# Patient Record
Sex: Male | Born: 1955
Health system: Southern US, Community
[De-identification: ages and names within clinical notes are randomized; demographics above are authoritative.]

## PROBLEM LIST (undated history)

## (undated) DIAGNOSIS — I351 Nonrheumatic aortic (valve) insufficiency: Secondary | ICD-10-CM

## (undated) DIAGNOSIS — N189 Chronic kidney disease, unspecified: Secondary | ICD-10-CM

## (undated) DIAGNOSIS — R55 Syncope and collapse: Secondary | ICD-10-CM

## (undated) DIAGNOSIS — F419 Anxiety disorder, unspecified: Secondary | ICD-10-CM

## (undated) DIAGNOSIS — G4733 Obstructive sleep apnea (adult) (pediatric): Secondary | ICD-10-CM

## (undated) DIAGNOSIS — Z87442 Personal history of urinary calculi: Secondary | ICD-10-CM

## (undated) DIAGNOSIS — Z953 Presence of xenogenic heart valve: Secondary | ICD-10-CM

## (undated) DIAGNOSIS — R0600 Dyspnea, unspecified: Secondary | ICD-10-CM

## (undated) DIAGNOSIS — M792 Neuralgia and neuritis, unspecified: Secondary | ICD-10-CM

## (undated) DIAGNOSIS — D649 Anemia, unspecified: Secondary | ICD-10-CM

## (undated) DIAGNOSIS — J45909 Unspecified asthma, uncomplicated: Secondary | ICD-10-CM

## (undated) DIAGNOSIS — M1990 Unspecified osteoarthritis, unspecified site: Secondary | ICD-10-CM

## (undated) DIAGNOSIS — R0602 Shortness of breath: Secondary | ICD-10-CM

## (undated) DIAGNOSIS — D631 Anemia in chronic kidney disease: Secondary | ICD-10-CM

## (undated) DIAGNOSIS — R7303 Prediabetes: Secondary | ICD-10-CM

## (undated) DIAGNOSIS — I509 Heart failure, unspecified: Secondary | ICD-10-CM

## (undated) DIAGNOSIS — I5033 Acute on chronic diastolic (congestive) heart failure: Secondary | ICD-10-CM

## (undated) DIAGNOSIS — I10 Essential (primary) hypertension: Secondary | ICD-10-CM

## (undated) DIAGNOSIS — Q238 Other congenital malformations of aortic and mitral valves: Secondary | ICD-10-CM

## (undated) HISTORY — DX: Nonrheumatic aortic (valve) insufficiency: I35.1

## (undated) HISTORY — DX: Neuralgia and neuritis, unspecified: M79.2

## (undated) HISTORY — PX: BACK SURGERY: SHX140

## (undated) HISTORY — PX: HERNIA REPAIR: SHX51

## (undated) HISTORY — DX: Anxiety disorder, unspecified: F41.9

## (undated) HISTORY — DX: Shortness of breath: R06.02

---
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
## 1997-10-27 ENCOUNTER — Encounter (HOSPITAL_COMMUNITY): Admission: RE | Admit: 1997-10-27 | Discharge: 1998-01-25 | Payer: Self-pay | Admitting: Internal Medicine

## 1998-01-25 ENCOUNTER — Emergency Department (HOSPITAL_COMMUNITY): Admission: EM | Admit: 1998-01-25 | Discharge: 1998-01-25 | Payer: Self-pay | Admitting: Emergency Medicine

## 1999-02-19 ENCOUNTER — Encounter: Payer: Self-pay | Admitting: Emergency Medicine

## 1999-02-19 ENCOUNTER — Emergency Department (HOSPITAL_COMMUNITY): Admission: EM | Admit: 1999-02-19 | Discharge: 1999-02-19 | Payer: Self-pay | Admitting: Emergency Medicine

## 1999-10-18 ENCOUNTER — Emergency Department (HOSPITAL_COMMUNITY): Admission: EM | Admit: 1999-10-18 | Discharge: 1999-10-18 | Payer: Self-pay | Admitting: Emergency Medicine

## 1999-12-18 ENCOUNTER — Emergency Department (HOSPITAL_COMMUNITY): Admission: EM | Admit: 1999-12-18 | Discharge: 1999-12-18 | Payer: Self-pay | Admitting: Emergency Medicine

## 2000-01-02 ENCOUNTER — Emergency Department (HOSPITAL_COMMUNITY): Admission: EM | Admit: 2000-01-02 | Discharge: 2000-01-02 | Payer: Self-pay | Admitting: Emergency Medicine

## 2000-09-05 ENCOUNTER — Encounter: Payer: Self-pay | Admitting: Emergency Medicine

## 2000-09-05 ENCOUNTER — Emergency Department (HOSPITAL_COMMUNITY): Admission: EM | Admit: 2000-09-05 | Discharge: 2000-09-05 | Payer: Self-pay | Admitting: Emergency Medicine

## 2000-09-10 ENCOUNTER — Emergency Department (HOSPITAL_COMMUNITY): Admission: EM | Admit: 2000-09-10 | Discharge: 2000-09-10 | Payer: Self-pay | Admitting: *Deleted

## 2001-02-10 ENCOUNTER — Emergency Department (HOSPITAL_COMMUNITY): Admission: EM | Admit: 2001-02-10 | Discharge: 2001-02-10 | Payer: Self-pay | Admitting: Emergency Medicine

## 2001-03-19 ENCOUNTER — Emergency Department (HOSPITAL_COMMUNITY): Admission: EM | Admit: 2001-03-19 | Discharge: 2001-03-19 | Payer: Self-pay | Admitting: Emergency Medicine

## 2001-04-19 ENCOUNTER — Emergency Department (HOSPITAL_COMMUNITY): Admission: EM | Admit: 2001-04-19 | Discharge: 2001-04-19 | Payer: Self-pay | Admitting: Emergency Medicine

## 2001-04-19 ENCOUNTER — Encounter: Payer: Self-pay | Admitting: Emergency Medicine

## 2001-06-30 ENCOUNTER — Emergency Department (HOSPITAL_COMMUNITY): Admission: EM | Admit: 2001-06-30 | Discharge: 2001-06-30 | Payer: Self-pay | Admitting: *Deleted

## 2002-04-21 ENCOUNTER — Emergency Department (HOSPITAL_COMMUNITY): Admission: EM | Admit: 2002-04-21 | Discharge: 2002-04-21 | Payer: Self-pay | Admitting: Emergency Medicine

## 2002-04-21 ENCOUNTER — Encounter: Payer: Self-pay | Admitting: Emergency Medicine

## 2002-12-12 ENCOUNTER — Encounter: Payer: Self-pay | Admitting: Emergency Medicine

## 2002-12-13 ENCOUNTER — Inpatient Hospital Stay (HOSPITAL_COMMUNITY): Admission: EM | Admit: 2002-12-13 | Discharge: 2002-12-16 | Payer: Self-pay | Admitting: Neurological Surgery

## 2002-12-13 ENCOUNTER — Encounter: Payer: Self-pay | Admitting: Neurological Surgery

## 2002-12-14 ENCOUNTER — Encounter (INDEPENDENT_AMBULATORY_CARE_PROVIDER_SITE_OTHER): Payer: Self-pay | Admitting: Cardiology

## 2002-12-16 ENCOUNTER — Encounter: Payer: Self-pay | Admitting: Neurology

## 2003-01-11 ENCOUNTER — Ambulatory Visit (HOSPITAL_COMMUNITY): Admission: RE | Admit: 2003-01-11 | Discharge: 2003-01-11 | Payer: Self-pay | Admitting: Family Medicine

## 2003-01-15 ENCOUNTER — Encounter: Admission: RE | Admit: 2003-01-15 | Discharge: 2003-01-15 | Payer: Self-pay | Admitting: Family Medicine

## 2003-01-15 ENCOUNTER — Encounter: Payer: Self-pay | Admitting: Family Medicine

## 2003-03-15 ENCOUNTER — Ambulatory Visit (HOSPITAL_COMMUNITY): Admission: RE | Admit: 2003-03-15 | Discharge: 2003-03-15 | Payer: Self-pay | Admitting: Internal Medicine

## 2003-03-16 ENCOUNTER — Ambulatory Visit (HOSPITAL_COMMUNITY): Admission: RE | Admit: 2003-03-16 | Discharge: 2003-03-16 | Payer: Self-pay | Admitting: Internal Medicine

## 2003-10-06 ENCOUNTER — Emergency Department (HOSPITAL_COMMUNITY): Admission: EM | Admit: 2003-10-06 | Discharge: 2003-10-06 | Payer: Self-pay | Admitting: Emergency Medicine

## 2004-02-16 ENCOUNTER — Emergency Department (HOSPITAL_COMMUNITY): Admission: EM | Admit: 2004-02-16 | Discharge: 2004-02-16 | Payer: Self-pay | Admitting: Emergency Medicine

## 2004-02-17 ENCOUNTER — Emergency Department (HOSPITAL_COMMUNITY): Admission: EM | Admit: 2004-02-17 | Discharge: 2004-02-17 | Payer: Self-pay | Admitting: Emergency Medicine

## 2004-11-29 ENCOUNTER — Emergency Department (HOSPITAL_COMMUNITY): Admission: EM | Admit: 2004-11-29 | Discharge: 2004-11-30 | Payer: Self-pay | Admitting: Emergency Medicine

## 2005-06-23 ENCOUNTER — Emergency Department (HOSPITAL_COMMUNITY): Admission: EM | Admit: 2005-06-23 | Discharge: 2005-06-23 | Payer: Self-pay | Admitting: Emergency Medicine

## 2005-08-03 ENCOUNTER — Emergency Department (HOSPITAL_COMMUNITY): Admission: EM | Admit: 2005-08-03 | Discharge: 2005-08-03 | Payer: Self-pay | Admitting: Emergency Medicine

## 2005-08-10 ENCOUNTER — Emergency Department (HOSPITAL_COMMUNITY): Admission: EM | Admit: 2005-08-10 | Discharge: 2005-08-10 | Payer: Self-pay | Admitting: Emergency Medicine

## 2005-08-12 ENCOUNTER — Emergency Department (HOSPITAL_COMMUNITY): Admission: EM | Admit: 2005-08-12 | Discharge: 2005-08-12 | Payer: Self-pay | Admitting: *Deleted

## 2005-08-13 ENCOUNTER — Ambulatory Visit: Payer: Self-pay | Admitting: Internal Medicine

## 2005-08-20 ENCOUNTER — Ambulatory Visit: Payer: Self-pay | Admitting: Internal Medicine

## 2005-09-05 ENCOUNTER — Ambulatory Visit: Payer: Self-pay | Admitting: Internal Medicine

## 2005-09-14 ENCOUNTER — Ambulatory Visit: Payer: Self-pay | Admitting: Internal Medicine

## 2005-09-18 ENCOUNTER — Ambulatory Visit: Payer: Self-pay | Admitting: Cardiovascular Disease

## 2005-10-10 ENCOUNTER — Emergency Department (HOSPITAL_COMMUNITY): Admission: EM | Admit: 2005-10-10 | Discharge: 2005-10-10 | Payer: Self-pay | Admitting: *Deleted

## 2005-10-22 ENCOUNTER — Emergency Department (HOSPITAL_COMMUNITY): Admission: EM | Admit: 2005-10-22 | Discharge: 2005-10-22 | Payer: Self-pay | Admitting: Emergency Medicine

## 2006-01-30 ENCOUNTER — Ambulatory Visit: Payer: Self-pay | Admitting: Internal Medicine

## 2006-02-04 ENCOUNTER — Ambulatory Visit: Payer: Self-pay | Admitting: Internal Medicine

## 2006-02-11 ENCOUNTER — Ambulatory Visit: Payer: Self-pay | Admitting: Internal Medicine

## 2006-02-20 ENCOUNTER — Ambulatory Visit: Payer: Self-pay | Admitting: Emergency Medicine

## 2006-02-25 ENCOUNTER — Ambulatory Visit: Payer: Self-pay | Admitting: Internal Medicine

## 2006-03-12 ENCOUNTER — Ambulatory Visit: Payer: Self-pay | Admitting: Internal Medicine

## 2006-03-20 ENCOUNTER — Ambulatory Visit: Payer: Self-pay | Admitting: Emergency Medicine

## 2006-03-22 ENCOUNTER — Ambulatory Visit: Payer: Self-pay | Admitting: Emergency Medicine

## 2006-03-26 ENCOUNTER — Ambulatory Visit: Payer: Self-pay | Admitting: Internal Medicine

## 2006-04-22 ENCOUNTER — Ambulatory Visit: Payer: Self-pay | Admitting: Emergency Medicine

## 2006-04-23 ENCOUNTER — Ambulatory Visit: Payer: Self-pay | Admitting: Cardiology

## 2006-09-10 ENCOUNTER — Ambulatory Visit: Payer: Self-pay | Admitting: Internal Medicine

## 2006-12-02 ENCOUNTER — Ambulatory Visit: Payer: Self-pay | Admitting: Internal Medicine

## 2007-03-24 DIAGNOSIS — K219 Gastro-esophageal reflux disease without esophagitis: Secondary | ICD-10-CM | POA: Insufficient documentation

## 2007-03-24 DIAGNOSIS — J45909 Unspecified asthma, uncomplicated: Secondary | ICD-10-CM | POA: Insufficient documentation

## 2007-06-13 ENCOUNTER — Telehealth (INDEPENDENT_AMBULATORY_CARE_PROVIDER_SITE_OTHER): Payer: Self-pay | Admitting: *Deleted

## 2007-06-16 ENCOUNTER — Ambulatory Visit: Payer: Self-pay | Admitting: Internal Medicine

## 2007-06-16 DIAGNOSIS — Z9189 Other specified personal risk factors, not elsewhere classified: Secondary | ICD-10-CM | POA: Insufficient documentation

## 2007-07-22 ENCOUNTER — Encounter: Payer: Self-pay | Admitting: Internal Medicine

## 2007-08-04 ENCOUNTER — Ambulatory Visit: Payer: Self-pay | Admitting: Internal Medicine

## 2007-11-22 ENCOUNTER — Emergency Department (HOSPITAL_COMMUNITY): Admission: EM | Admit: 2007-11-22 | Discharge: 2007-11-22 | Payer: Self-pay | Admitting: Emergency Medicine

## 2008-05-06 ENCOUNTER — Emergency Department (HOSPITAL_COMMUNITY): Admission: EM | Admit: 2008-05-06 | Discharge: 2008-05-06 | Payer: Self-pay | Admitting: Family Medicine

## 2008-05-31 ENCOUNTER — Emergency Department (HOSPITAL_COMMUNITY): Admission: EM | Admit: 2008-05-31 | Discharge: 2008-05-31 | Payer: Self-pay | Admitting: Emergency Medicine

## 2008-06-17 ENCOUNTER — Encounter: Payer: Self-pay | Admitting: Internal Medicine

## 2008-06-18 ENCOUNTER — Emergency Department (HOSPITAL_COMMUNITY): Admission: EM | Admit: 2008-06-18 | Discharge: 2008-06-18 | Payer: Self-pay | Admitting: Family Medicine

## 2008-07-21 ENCOUNTER — Ambulatory Visit: Payer: Self-pay | Admitting: Family Medicine

## 2008-08-04 ENCOUNTER — Telehealth (INDEPENDENT_AMBULATORY_CARE_PROVIDER_SITE_OTHER): Payer: Self-pay | Admitting: *Deleted

## 2008-08-23 ENCOUNTER — Telehealth (INDEPENDENT_AMBULATORY_CARE_PROVIDER_SITE_OTHER): Payer: Self-pay | Admitting: *Deleted

## 2008-08-23 ENCOUNTER — Encounter (INDEPENDENT_AMBULATORY_CARE_PROVIDER_SITE_OTHER): Payer: Self-pay | Admitting: *Deleted

## 2008-08-23 ENCOUNTER — Ambulatory Visit: Payer: Self-pay | Admitting: Internal Medicine

## 2008-08-30 ENCOUNTER — Encounter (INDEPENDENT_AMBULATORY_CARE_PROVIDER_SITE_OTHER): Payer: Self-pay | Admitting: *Deleted

## 2008-09-13 ENCOUNTER — Ambulatory Visit: Payer: Self-pay | Admitting: Internal Medicine

## 2008-09-15 ENCOUNTER — Encounter (INDEPENDENT_AMBULATORY_CARE_PROVIDER_SITE_OTHER): Payer: Self-pay | Admitting: *Deleted

## 2008-09-15 LAB — CONVERTED CEMR LAB: Fecal Occult Bld: NEGATIVE

## 2008-10-04 ENCOUNTER — Telehealth (INDEPENDENT_AMBULATORY_CARE_PROVIDER_SITE_OTHER): Payer: Self-pay | Admitting: *Deleted

## 2008-10-05 ENCOUNTER — Ambulatory Visit: Payer: Self-pay | Admitting: Internal Medicine

## 2008-10-12 DIAGNOSIS — E119 Type 2 diabetes mellitus without complications: Secondary | ICD-10-CM | POA: Insufficient documentation

## 2008-10-28 ENCOUNTER — Telehealth (INDEPENDENT_AMBULATORY_CARE_PROVIDER_SITE_OTHER): Payer: Self-pay | Admitting: *Deleted

## 2008-11-02 ENCOUNTER — Ambulatory Visit: Payer: Self-pay | Admitting: Internal Medicine

## 2009-01-05 ENCOUNTER — Ambulatory Visit: Payer: Self-pay | Admitting: Family Medicine

## 2009-01-05 ENCOUNTER — Encounter: Payer: Self-pay | Admitting: Internal Medicine

## 2009-01-05 DIAGNOSIS — N200 Calculus of kidney: Secondary | ICD-10-CM | POA: Insufficient documentation

## 2009-01-05 LAB — CONVERTED CEMR LAB
Bilirubin Urine: NEGATIVE
Specific Gravity, Urine: 1.01
Urobilinogen, UA: 0.2
pH: 6

## 2009-01-06 ENCOUNTER — Encounter (INDEPENDENT_AMBULATORY_CARE_PROVIDER_SITE_OTHER): Payer: Self-pay | Admitting: *Deleted

## 2009-01-06 ENCOUNTER — Encounter: Payer: Self-pay | Admitting: Family Medicine

## 2009-01-06 LAB — CONVERTED CEMR LAB

## 2009-01-11 ENCOUNTER — Telehealth (INDEPENDENT_AMBULATORY_CARE_PROVIDER_SITE_OTHER): Payer: Self-pay | Admitting: *Deleted

## 2009-02-23 ENCOUNTER — Encounter (INDEPENDENT_AMBULATORY_CARE_PROVIDER_SITE_OTHER): Payer: Self-pay | Admitting: *Deleted

## 2009-07-26 ENCOUNTER — Encounter (INDEPENDENT_AMBULATORY_CARE_PROVIDER_SITE_OTHER): Payer: Self-pay | Admitting: *Deleted

## 2009-07-26 ENCOUNTER — Encounter: Payer: Self-pay | Admitting: Family Medicine

## 2009-08-03 ENCOUNTER — Telehealth (INDEPENDENT_AMBULATORY_CARE_PROVIDER_SITE_OTHER): Payer: Self-pay | Admitting: *Deleted

## 2009-08-05 ENCOUNTER — Encounter (INDEPENDENT_AMBULATORY_CARE_PROVIDER_SITE_OTHER): Payer: Self-pay | Admitting: *Deleted

## 2009-08-19 ENCOUNTER — Ambulatory Visit: Payer: Self-pay | Admitting: Internal Medicine

## 2009-08-19 ENCOUNTER — Ambulatory Visit: Payer: Self-pay | Admitting: *Deleted

## 2009-08-19 DIAGNOSIS — F329 Major depressive disorder, single episode, unspecified: Secondary | ICD-10-CM | POA: Insufficient documentation

## 2009-08-19 DIAGNOSIS — F3289 Other specified depressive episodes: Secondary | ICD-10-CM | POA: Insufficient documentation

## 2009-08-25 ENCOUNTER — Telehealth: Payer: Self-pay | Admitting: Internal Medicine

## 2009-09-02 ENCOUNTER — Telehealth: Payer: Self-pay | Admitting: Internal Medicine

## 2009-09-05 ENCOUNTER — Encounter (INDEPENDENT_AMBULATORY_CARE_PROVIDER_SITE_OTHER): Payer: Self-pay | Admitting: *Deleted

## 2010-04-26 ENCOUNTER — Ambulatory Visit: Payer: Self-pay | Admitting: Internal Medicine

## 2010-04-26 DIAGNOSIS — M79609 Pain in unspecified limb: Secondary | ICD-10-CM | POA: Insufficient documentation

## 2010-04-28 ENCOUNTER — Encounter: Payer: Self-pay | Admitting: Internal Medicine

## 2010-06-13 ENCOUNTER — Telehealth: Payer: Self-pay | Admitting: Internal Medicine

## 2010-06-27 NOTE — Progress Notes (Signed)
Summary: MED ASSISTANCE PAPERWORK TO COMPLETE  Phone Note Call from Patient Call back at [phone]   Caller: Patient Summary of Call: [name]  PROGRAM TO BE FILLED OUT BY DR [place] PATIENT TO PICK UP SO HE CAN ADD HIS PAPERWORK AND MAIL ALL FORMS IN  WILL TAKE TO [facility] Initial call taken by: Berneta Sages,  September 02, 2009 2:52 PM  Follow-up for Phone Call        Charles Riggs has [name]  September 02, 2009 3:37 PM  Patient needs form completed for symbicort (Patient assistance program) #90 with 3 refills has been requested.  Last CPX 07/2008.  Last acute ov was 07/2009 for depression.  No pending appt.  Ok to complete for #90 with 3??? Dawson Bills  September 05, 2009 8:36 AM    Additional Follow-up for Phone Call Additional follow up Details #1::        ok 90, no RF no further Rx-RFs w/o OV Jose E. Paz MD  September 05, 2009 11:12 AM   reviewed patient assistance form -- it does not need a MD sig.  All info is for pt to complete.  Mailed form, rx, & letter to patient -- NEEDS OFFICE VISIT FOR ADDITIONAL RX Dawson Bills  September 05, 2009 1:21 PM     Prescriptions: SYMBICORT 160-4.5 MCG/ACT  AERO (BUDESONIDE-FORMOTEROL FUMARATE) 1 puff two times a day  #3 MO SUPPLY x 0   Entered by:   Dawson Bills   Authorized by:   Alda Berthold. Paz MD   Signed by:   Dawson Bills on 09/05/2009   Method used:   Print then Mail to Patient   RxID:   [id number]

## 2010-06-27 NOTE — Medication Information (Signed)
Summary: Symbicort/[place] & [place] Prescription Savings Program  Symbicort/[place] & [place] Prescription Savings Program   Imported By: Edmonia James 08/02/2009 11:54:20  _____________________________________________________________________  External Attachment:    Type:   Image     Comment:   External Document

## 2010-06-27 NOTE — Letter (Signed)
Summary: Primary Care Appointment Letter  [facility]   [place], [place] [address]   Phone: [phone]  Fax: [phone]    08/05/2009 MRN: [id number]  Charles Riggs [address] [place], [place]  [address]  Dear [name],   Your Primary Care Physician [place] Paz MD has indicated that:    ____x___it is time to schedule an appointment.    _______you missed your appointment on______ and need to call and          reschedule.    _______you need to have lab work done.    _______you need to schedule an appointment discuss lab or test results.    _______you need to call to reschedule your appointment that is                       scheduled on _________.     Please call our office as soon as possible. Our phone number is [phone]________. Please press option 1. Our office is open 8a-12noon and 1p-5p, Monday through Friday.     Thank you,    [place]

## 2010-06-27 NOTE — Assessment & Plan Note (Signed)
Summary: SEVERE DEPRESSION/RH......   Vital Signs:  Patient profile:   55 year old male Height:      71.25 inches Weight:      230.6 pounds BMI:     32.05 Pulse rate:   86 / minute Pulse rhythm:   regular BP sitting:   150 / 84 Cuff size:   large  Vitals Entered By: Dawson Bills (August 19, 2009 10:46 AM) CC: depression Comments  - lots of family issues  - severe depression (crying in exam room)  - patient does not want to go out in public  - not eating, not sleeping  - "dreaming while awake, see things, & then acting on them"  - pt d/c'd buproban this week Dawson Bills  August 19, 2009 10:57 AM    History of Present Illness: having family problems for a while, currently separated from his wife for the last 3 months he has been feeling depressed he is withdrawal from friends, not sleeping well, not eating properly. He has crying spells he attends school for the last two weeks he felt that he cannot go.  He had similar symptoms before, they lasted 4 years, this time he said that he needs help , he doesn't like to go through that again. He has daydreams that are somehow disturbing  Current Medications (verified): 1)  Albuterol 90 Mcg/act  Aers (Albuterol) .... Inhale Two Puffs Every Four To Six Hours As Needed 2)  Prilosec Otc 20 Mg  Tbec (Omeprazole Magnesium) .Marland Kitchen.. 1 By Mouth Q Am Before Breakfast 3)  Symbicort 160-4.5 Mcg/act  Aero (Budesonide-Formoterol Fumarate) .Marland Kitchen.. 1 Puff Two Times A Day  Allergies (verified): No Known Drug Allergies  Past History:  Past Medical History: Asthma h/o cocaine abuse (resolved now) GERD kidney stones Depression  Past Surgical History: Reviewed history from 08/23/2008 and no changes required. Inguinal herniorrhaphy  Social History: Married, separeted 10 children  unemployed   Review of Systems       denies using alcohol, drugs. He discontinued Wellbutrin daily cycle which help him quit tobacco.  He is not back  smoking denies suicidal ideas or homicidal ideas.  He did state "if I don't wake up I don't care" slightly  anxious   Physical Exam  General:  alert and well-developed.   Psych:  Oriented X3, good eye contact, and not anxious appearing.  very calm   Impression & Recommendations:  Problem # 1:  DEPRESSION (ICD-311)  obviously  depressed with some anxiety, can't sleep well, not eating. He needs help quickly, no suicidal ideas but I still very concerned about him Plan: Counseling as soon as possible start citalopram which should help w/ mood and  sleeping Warned about suicidal ideas, he knows to call me or go to the ER.  Also a hotline number provided. Follow-up one week  His updated medication list for this problem includes:    Citalopram Hydrobromide 20 Mg Tabs (Citalopram hydrobromide) ..... Half tablet for 5 days, then one tablet daily  Orders: Psychology Referral (Psychology)  Problem # 2:  Addendum Spoke w/ Mrs Troxler who saw the patient the afternoon of 3-25, she counseled , not worry about suicidality but concer about bipolar disease. Wil check on him 3-28 to be sure he is ok on SSRI  Problem # 3:  total time spent > 25 min   Complete Medication List: 1)  Albuterol 90 Mcg/act Aers (Albuterol) .... Inhale two puffs every four to six hours as needed 2)  Prilosec Otc  20 Mg Tbec (Omeprazole magnesium) .Marland Kitchen.. 1 by mouth q am before breakfast 3)  Symbicort 160-4.5 Mcg/act Aero (Budesonide-formoterol fumarate) .Marland Kitchen.. 1 puff two times a day 4)  Citalopram Hydrobromide 20 Mg Tabs (Citalopram hydrobromide) .... Half tablet for 5 days, then one tablet daily  Patient Instructions: 1)  start citalopram 2)  call  [place] [phone]  if you feel more depression or suicidal ideas  3)  Please schedule a follow-up appointment in 1  weeks.  Prescriptions: CITALOPRAM HYDROBROMIDE 20 MG TABS (CITALOPRAM HYDROBROMIDE) half tablet for 5 days, then one tablet daily  #30 x  0   Entered and Authorized by:   Alda Berthold. Markus Casten MD   Signed by:   Alda Berthold. Xariah Silvernail MD on 08/19/2009   Method used:   Print then Give to Patient   RxID:   [id number]

## 2010-06-27 NOTE — Progress Notes (Signed)
Summary: calling to check on pt -- unable to contact  Phone Note Outgoing Call Call back at [facility] Phone [phone] Call back at Work Phone [phone]   Summary of Call:  [place] ON PT: constant busy signal @ home number, phone number incorrect Charles Riggs  August 25, 2009 12:04 PM constant busy signal @ home number Charles Riggs  August 26, 2009 3:14 PM

## 2010-06-27 NOTE — Assessment & Plan Note (Signed)
Summary: REFILL ABX AND PAIN MED/KB   Vital Signs:  Patient profile:   55 year old male Weight:      208.38 pounds O2 Sat:      98 % on Room air Temp:     98.7 degrees F oral Pulse rate:   87 / minute Pulse rhythm:   regular BP sitting:   142 / 82  (left arm) Cuff size:   large  Vitals Entered By: Allyn Kenner CMA (April 26, 2010 11:38 AM)  O2 Flow:  Room air CC: Pt here c/o pain in legs . Comments Pt states lymph nodes are swollen. No throat pain. [facility] [address]   History of Present Illness: went back to work few weeks ago, he is up on his feet all day long since then c/o pain in the R leg from time to time (distal from knee) also had redness-swelling-tenderness at a "lymphnode" in the R groin.  also stated that at some point his leg was swollen but it got better 2 or 3 weeks ago the LAD is better but request pain meds and an Abx    ROS was seen months ago w/  depression, took citalopram as Rx but runned out overall mood is  better  denies dysuria, gross hematuria, genital rash, penile discharge Request a refill of his asthma medicines  Current Medications (verified): 1)  Albuterol 90 Mcg/act  Aers (Albuterol) .... Inhale Two Puffs Every Four To Six Hours As Needed 2)  Prilosec Otc 20 Mg  Tbec (Omeprazole Magnesium) .Marland Kitchen.. 1 By Mouth Q Am Before Breakfast 3)  Symbicort 160-4.5 Mcg/act  Aero (Budesonide-Formoterol Fumarate) .Marland Kitchen.. 1 Puff Two Times A Day 4)  Citalopram Hydrobromide 20 Mg Tabs (Citalopram Hydrobromide) .... Half Tablet For 5 Days, Then One Tablet Daily  Allergies (verified): No Known Drug Allergies   Past History:  Past Medical History: Reviewed history from 08/19/2009 and no changes required. Asthma h/o cocaine abuse (resolved now) GERD kidney stones Depression  Past Surgical History: Reviewed history from 08/23/2008 and no changes required. Inguinal herniorrhaphy  Social History: Reviewed history from 08/19/2009 and no changes  required. 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   Abdomen:  soft, non-tender, no distention, no masses, no guarding, and no rigidity.   Genitalia:  he has bilateral less than 1 cm lymph nodes in the groin. Non if them  are tender, fluctuant or attached to deeper structures Extremities:  no lower extremity edema calves  are symmetric and nontender   Impression & Recommendations:  Problem # 1:  LEG PAIN (ICD-729.5) patient presents today complaining of leg pain and history of lymphadenopathy. At this point there is no evidence of cellulitis, lymph node infections; leg is not swollen he requests pain medication. He has a history of cocaine abuse. Plan: Strongly advise against use of narcotics Trial with meloxicam , GI side effects discussed See instructions patient will call if swelling re-surface  Problem # 2:  ASTHMA (ICD-493.90) RF  His updated medication list for this problem includes:    Ventolin Hfa 108 (90 Base) Mcg/act Aers (Albuterol sulfate) .Marland Kitchen... 2 puffs every 6 hours as needed    Symbicort 160-4.5 Mcg/act Aero (Budesonide-formoterol fumarate) .Marland Kitchen... 1 puff two times a day  Problem # 3:  he is overdue for a checkup, recommend him to schedule a followup history of poor compliance  Complete Medication List: 1)  Ventolin Hfa 108 (90 Base) Mcg/act  Aers (Albuterol sulfate) .... 2 puffs every 6 hours as needed 2)  Prilosec Otc 20 Mg Tbec (Omeprazole magnesium) .Marland Kitchen.. 1 by mouth q am before breakfast 3)  Symbicort 160-4.5 Mcg/act Aero (Budesonide-formoterol fumarate) .Marland Kitchen.. 1 puff two times a day 4)  Meloxicam 15 Mg Tabs (Meloxicam) .Marland Kitchen.. 1 by mouth once daily  Patient Instructions: 1)  take meloxicam  as prescribed once a day with food. 2)  If you still have pain in the leg, you can also take Tylenol 500 mg one or 2 tablets every 6 hours 3)  we'll send all the medicines to your pharmacy 4)  You are due for  physical exam, please schedule at your convenience Prescriptions: MELOXICAM 15 MG TABS (MELOXICAM) 1 by mouth once daily  #30 x 3   Entered by:   Allyn Kenner CMA   Authorized by:   Alda Berthold. Paz MD   Signed by:   Allyn Kenner CMA on 04/26/2010   Method used:   Electronically to        [facility] [phone]* (retail)       [place],   [address]       Ph: [phone]       Fax: [phone]   RxID:   [id number] SYMBICORT 160-4.5 MCG/ACT  AERO (BUDESONIDE-FORMOTEROL FUMARATE) 1 puff two times a day  #3 MO SUPPLY x 3   Entered and Authorized by:   Alda Berthold. Paz MD   Signed by:   Alda Berthold. Paz MD on 04/26/2010   Method used:   Print then Give to Patient   RxID:   [id number] VENTOLIN HFA 108 (90 BASE) MCG/ACT AERS (ALBUTEROL SULFATE) 2 puffs every 6 hours as needed  #1 x 3   Entered and Authorized by:   Alda Berthold. Paz MD   Signed by:   Alda Berthold. Paz MD on 04/26/2010   Method used:   Print then Give to Patient   RxID:   [id number]    Orders Added: 1)  Est. Patient Level III [id number]

## 2010-06-27 NOTE — Letter (Signed)
Summary: Primary Care Appointment Letter  [facility]   [place], [place] [address]   Phone: [phone]  Fax: [phone]    09/05/2009 MRN: [id number]  Charles Riggs [address] [place], [place]  [address]  Dear Mr. Charles Riggs,   Your Primary Care Physician [place] Paz MD has indicated that:    ___x____it is time to schedule an appointment.  Please call our office @ [phone] to schedule an office visit with Dr. Larose Kells.      Thank you,    [place]

## 2010-06-27 NOTE — Progress Notes (Signed)
Summary: due ov  Phone Note Outgoing Call   Summary of Call: Tillie Rung - pt due ROV -- he was a no show in Sept.  In order for [place] to keep approving symbicort #3 with 3 refills to his patient assistance program he needs an office visit.  Please schedule Dawson Bills  August 03, 2009 8:14 AM     Additional Follow-up for Phone Call Additional follow up Details #2::    LMTCB...Marland KitchenSilva Bandy  August 03, 2009 8:46 AM  mailed a letter  Follow-up by: Silva Bandy,  August 03, 2009 8:46 AM

## 2010-06-27 NOTE — Letter (Signed)
Summary: Primary Care Appointment Letter  [facility]   [place], [place] [address]   Phone: [phone]  Fax: [phone]    07/26/2009 MRN: [id number]  PATRICIO DUBUQUE [address] [place], [place]  [address]  Dear [name],   Your Primary Care Physician [place] Paz MD has indicated that:    ___x____it is time to schedule an appointment.  Please call our office @ [phone] ext [phone] to schedule an appointment with Dr. Larose Kells.        Thank you,    [place]

## 2010-06-29 NOTE — Progress Notes (Signed)
Summary: Legal issues/records needed  Phone Note Call from Patient   Summary of Call: Patient left a very cofusing message on triage so I called him for more information. Last year (approx 07/2009 per patient) he was seeing Mrs. Troxler in the office. He notes that she had him on medications and was helping him cope with legal separation from his spouse. During this period his spouse read him legal separation papers and he signed them. He notes that this was a mistake and he needs documentation stating when he was seen, the meds given, and noting that he was not in the proper state of mind to be signing legal documents.  Initial call taken by: Ernestene Mention CMA,  June 13, 2010 11:51 AM  Follow-up for Phone Call        Per Lexine Baton we do not have any of that information and she is no longer here (Mrs. Elvina Mattes). The patient must speak with Anderson Malta who is a Freight forwarder at [employer] and if she is not available then he should speak with Cleotilde Neer. Behavioral health # [phone].  Patient notified and will call them. He is aware that we do not have any of those records.    Follow-up by: Ernestene Mention CMA,  June 13, 2010 12:01 PM

## 2010-06-29 NOTE — Medication Information (Signed)
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

## 2010-10-01 ENCOUNTER — Emergency Department (HOSPITAL_COMMUNITY)
Admission: EM | Admit: 2010-10-01 | Discharge: 2010-10-01 | Disposition: A | Discharge status: Home / Self Care | Payer: Self-pay | Attending: Emergency Medicine | Admitting: Emergency Medicine

## 2010-10-01 DIAGNOSIS — L03019 Cellulitis of unspecified finger: Secondary | ICD-10-CM | POA: Insufficient documentation

## 2010-10-04 ENCOUNTER — Emergency Department (HOSPITAL_COMMUNITY)
Admission: EM | Admit: 2010-10-04 | Discharge: 2010-10-04 | Disposition: A | Discharge status: Home / Self Care | Payer: Self-pay | Attending: Emergency Medicine | Admitting: Emergency Medicine

## 2010-10-04 DIAGNOSIS — R599 Enlarged lymph nodes, unspecified: Secondary | ICD-10-CM | POA: Insufficient documentation

## 2010-10-04 DIAGNOSIS — J029 Acute pharyngitis, unspecified: Secondary | ICD-10-CM | POA: Insufficient documentation

## 2010-10-07 ENCOUNTER — Emergency Department (HOSPITAL_COMMUNITY)
Admission: EM | Admit: 2010-10-07 | Discharge: 2010-10-07 | Disposition: A | Discharge status: Home / Self Care | Payer: Self-pay | Attending: Emergency Medicine | Admitting: Emergency Medicine

## 2010-10-07 DIAGNOSIS — N289 Disorder of kidney and ureter, unspecified: Secondary | ICD-10-CM | POA: Insufficient documentation

## 2010-10-07 DIAGNOSIS — I10 Essential (primary) hypertension: Secondary | ICD-10-CM | POA: Insufficient documentation

## 2010-10-07 DIAGNOSIS — J36 Peritonsillar abscess: Secondary | ICD-10-CM | POA: Insufficient documentation

## 2010-10-07 LAB — CBC
HCT: 36.7 % — ABNORMAL LOW (ref 39.0–52.0)
Hemoglobin: 12.3 g/dL — ABNORMAL LOW (ref 13.0–17.0)
MCH: 28.4 pg (ref 26.0–34.0)
MCV: 84.8 fL (ref 78.0–100.0)
Platelets: 243 10*3/uL (ref 150–400)
RBC: 4.33 MIL/uL (ref 4.22–5.81)
WBC: 7.7 10*3/uL (ref 4.0–10.5)

## 2010-10-07 LAB — DIFFERENTIAL
Lymphocytes Relative: 23 % (ref 12–46)
Lymphs Abs: 1.8 10*3/uL (ref 0.7–4.0)
Monocytes Relative: 12 % (ref 3–12)
Neutro Abs: 4.9 10*3/uL (ref 1.7–7.7)
Neutrophils Relative %: 63 % (ref 43–77)

## 2010-10-07 LAB — BASIC METABOLIC PANEL
BUN: 15 mg/dL (ref 6–23)
CO2: 26 mEq/L (ref 19–32)
Chloride: 106 mEq/L (ref 96–112)
Creatinine, Ser: 1.58 mg/dL — ABNORMAL HIGH (ref 0.4–1.5)
Glucose, Bld: 84 mg/dL (ref 70–99)
Potassium: 3.9 mEq/L (ref 3.5–5.1)

## 2010-10-07 LAB — RAPID STREP SCREEN (MED CTR MEBANE ONLY): Streptococcus, Group A Screen (Direct): NEGATIVE

## 2010-10-07 NOTE — Consult Note (Signed)
NAME:  Charles Riggs, GAEDE NO.:  [id number]  MEDICAL RECORD NO.:  [id number]           PATIENT TYPE:  E  LOCATION:  WLED                         FACILITY:  [facility]  PHYSICIAN:  Leta Baptist, MD            DATE OF BIRTH:  07-07-1955  DATE OF CONSULTATION:  10/07/2010 DATE OF DISCHARGE:                                CONSULTATION   SERVICE:  Otolaryngology Head and Neck Surgery Service.  CHIEF COMPLAINT:  Right peritonsillar abscess.  HISTORY OF PRESENT ILLNESS:  The patient is a 55 year old male who presented to the [facility] Emergency Room earlier this morning complaining of severe worsening of his right-sided sore throat. According to the patient, he first noted the sore throat approximately 6 days ago.  He was seen in the [facility] Emergency Room 3 days earlier and was diagnosed with viral pharyngitis.  He was treated with ibuprofen on a p.r.n. basis.  According to the patient, his sore throat severely worsened over the last 2 days.  At the time of arrival, he complained ofsevere odynophagia and dysphagia.  He also complains of subjective fever.  Upon evaluation by the ER staff, he was noted to have severe enlargement of his right peritonsillar area, consistent with right peritonsillar abscess.  ENT was therefore consulted for further evaluation and treatment.  PAST MEDICAL HISTORY: 1. Recurrent pharyngitis. 2. Hypertension. 3. Renal insufficiency.  HOME MEDICATIONS:  Ibuprofen p.r.n.  ALLERGIES:  No known drug allergy.  SOCIAL HISTORY:  The patient is a current smoker.  He denies use of any illegal drugs or alcohol abuse.  PHYSICAL EXAMINATION:  VITAL SIGNS:  Temperature 98.6, blood pressure 144/68, pulse 79, respirations 16, oxygen saturation 100% on room air. GENERAL:  The patient is a well-nourished and well-developed 55 year old male, in no acute distress.  He is alert and oriented x3. HEENT:  His pupils are equal, round, and reactive to light.   Extraocular motion is intact.  Examination of the ears show normal auricles, external auditory canals, and tympanic membranes bilaterally.  Nasal examination shows normal mucosa, septum, and turbinates.  Oral cavity examination shows normal lips, gum, tongue, and oral cavity mucosa. However, the right peritonsillar area is severely edematous.  The uvula is pushed to the left.  No significant trismus is noted. NECK:  Palpation of the neck reveals no lymphadenopathy or mass.  The trachea is midline.  No stridor is noted.  PROCEDURE PERFORMED:  Incision and drainage of right peritonsillar abscess.  ANESTHESIA:  A 1% lidocaine.  DESCRIPTION:  The patient was placed upright in the hospital bed.  A 1% lidocaine with 1:100,000 epinephrine was injected around the right peritonsillar area, especially around the superior pole.  After adequate local anesthesia was achieved, an 18-gauge spinal needle was used to make multiple passes through the superior pole of the right peritonsillar area.  Approximately 7 mL of purulent fluid was aspirated. Using the tip of the spinal needle, a small incision was made over the superior tonsillar pole.  The patient tolerated the procedure well without difficulty.  IMPRESSION:  Right peritonsillar abscess.  RECOMMENDATIONS: 1. Incision and drainage of right peritonsillar abscess. 2. The patient will be discharged home on clindamycin 300 mg p.o.     q.i.d. for 7 days.  He will follow up in my office in 1 week if he     continues to be symptomatic.     Leta Baptist, MD     ST/MEDQ  D:  10/07/2010  T:  10/07/2010  Job:  [id number]  Electronically Signed by Leta Baptist MD on 10/07/2010 09:52:00 AM

## 2010-10-13 NOTE — Cardiovascular Report (Signed)
NAME:  Charles Riggs, Charles Riggs NO.:  [id number]   MEDICAL RECORD NO.:  [id number]                   PATIENT TYPE:  INP   LOCATION:                                       FACILITY:  [facility]   PHYSICIAN:  Octavia Heir, M.D.             DATE OF BIRTH:  1956/04/06   DATE OF PROCEDURE:  12/16/2002  DATE OF DISCHARGE:  12/16/2002                              CARDIAC CATHETERIZATION   PROCEDURES PERFORMED:  Head-up tilt table testing with Isuprel infusion.   __________.   COMPLICATIONS:  None.   INDICATIONS FOR PROCEDURE:  [name] is a 55 year old male patient of  Alyson Locket. Love, M.D., admitted with recurrent syncope versus a seizure  disorder.  He did have CT scans of the brain, which were essentially normal.  Because of his recurrent syncope and lack of apparent neurologic cause of  his recurrent syncope, he is now referred for a tilt table test to rule out  vasodepressor syncope.   DESCRIPTION OF PROCEDURE:  After getting informed consent, the patient was  brought to the cardiac catheterization lab in a fasting state.  He was then  placed in a supine position.  Hemodynamic measurements were obtained.  The  patient had a resting blood pressure 130/64, resting heart rate 65.  This  was monitored for approximately 10 minutes.  He was then tilted to 70 degree  heads up position, which he maintained for approximately 30 minutes.  He  remained hemodynamically stable with no significant change in his blood  pressure or heart rate.  He was again placed in a supine position and  Isuprel infusion was begun at 7.5 mL an hour, which is equivalent to 0.5  mcg.  This was ultimately increased to 22.5 mcg an hour, totally 1.5 mcg.  He again remained hemodynamically stable.  He was then tilted to the 70  degree heads up position on Isuprel with no significant change in his heart  rate or blood pressure.  He did complain of feeling shaky but had no  syncopal or  presyncopal symptoms.  The patient was then returned to a supine  position and Isuprel was discontinued.  He remained hemodynamically stable  and was transferred to his room in a stable condition.   CONCLUSION:  Negative head-up tilt table testing with Isuprel infusion.                                               Octavia Heir, M.D.    RHM/MEDQ  D:  12/16/2002  T:  12/16/2002  Job:  [place]. Love, M.D.  [address] [place] [address]  Fax: [phone]   Odette Fraction, M.D.   Quay Burow, M.D.  [phone], [place] [address]  Fax: [phone]   cc:   Alyson Locket. Love, M.D.  [address] [place] [address]  Fax: [phone]   Odette Fraction, M.D.   Quay Burow, M.D.  [phone] [address] [place]  [place] [address]  Fax: [phone]

## 2010-10-13 NOTE — Assessment & Plan Note (Signed)
Charles Riggs                               PULMONARY OFFICE NOTE   ROYALE, HANKINS                       MRN:          [id number]  DATE:04/22/2006                            DOB:          Dec 22, 1955    Charles Riggs is a 55 year old gentleman with asthma confirmed on pulmonary  function testing at our last visit.  On those PFTs we were unable to show a  bronchodilator response, because he had taken albuterol prior to the test.  He has been using Advair 500/50 once b.i.d.  He tells me that he continues  to feel about the same and is using his albuterol inhaler about 4 times a  day.  He uses it when he experiences episodes of unable to get a good  breath in.  He has had wheezing, particularly at night.  He states that he  has had some episodes of heartburn, especially at night.  He denies any real  consistency in nasal symptoms, but he does have some allergic rhinitis at  night as well.  He has not gone back to work as he feels that there is  significant dust exposure that exacerbates his trouble breathing.  He has  asked to remain on leave from work until we get his symptoms under control.   CURRENT MEDICATIONS:  1. Advair 500/50 one inhalation b.i.d.  2. Albuterol 2 puffs q.4 hours p.r.n.   EXAM:  GENERAL:  This is a well-appearing gentleman who interacts  appropriately.  His weight is 246 pounds, up 6 pounds from our visit in October.  His  temperature is 98.0, blood pressure 130/72, heart rate 90, SpO2 98% on room  air.  HEENT:  Oropharynx is clear.  He has no stridor.  LUNGS:  Clear to auscultation bilaterally without any crackles or wheezes.  HEART:  Regular rate and rhythm without murmur.  ABDOMEN:  Benign.  EXTREMITIES:  No cyanosis, clubbing, or edema.   IMPRESSION:  Probable asthma with exacerbating factors including post-nasal  drip, probable gastroesophageal reflux disease and his dust exposure at  work.   PLAN:  1. We will treat  his post-nasal drip with a nasal corticosteroid.  2. Start omeprazole 20 mg b.i.d. to treat possible contribution of GERD to      his nocturnal symptoms.  3. Charles Riggs will stay out of work until January of 2008.  If he is to      return at that time, then he is going to require personal protective      equipment, including a mask or respirator so that he will not be      exposed to the dust in his job environment.  4. Given his absence of bronchodilator response on PFTs and his persistent      symptoms, I would like to perform a high-resolution CT scan of the      chest to rule out bronchiolitis or any      other disease that would mimic asthma.  5. I will followup with Charles Riggs in January of 2008.     Herbie Baltimore  Agustina Caroli, MD  Electronically Signed    RSB/MedQ  DD: 04/22/2006  DT: 04/22/2006  Job #: [id number]   cc:   Kathlene November, MD

## 2010-10-13 NOTE — H&P (Signed)
NAME:  NEVO, KLAUSEN                          ACCOUNT NO.:  [id number]   MEDICAL RECORD NO.:  [id number]                   PATIENT TYPE:  INP   LOCATION:  3002                                 FACILITY:  [facility]   PHYSICIAN:  Earleen Newport, M.D.               DATE OF BIRTH:  Oct 19, 1955   DATE OF ADMISSION:  12/13/2002  DATE OF DISCHARGE:                                HISTORY & PHYSICAL   ADMITTING DIAGNOSIS:  Headache, rule out subarachnoid hemorrhage.   HISTORY OF PRESENT ILLNESS:  Mr. Kataoka is a 55 year old right-handed male  who had a history of syncope on three occasions.  He notes that he began  feeling poorly with diaphoresis and then sustained a syncopal episode.  When  he came around, he had a severe and unrelenting headache and he complained  of some photophobia also.  He had three episodes of syncope subsequent to  that with a headache being persistent.  He also complained of some neck  stiffness which he keeps to this time.  He went to the emergency room at  [facility] yesterday evening and a lumbar puncture under  fluoroscopy was performed after a CT scan was noted to be negative.  The  fluid that was obtained initially was cloudy and bloody.  The supernatant  had some slight discoloration.  The first tube had 20 white cells and  154,000 red cells.  Glucose was 77, total protein 99.  Subsequent tube that  was drawn demonstrated a pink color and clarity noted slight xanthochromia.  There were 8 white cells and 2250 red cells.  I was consulted because of  this abnormal pathology and, after some discussion with Dr. Winfred Leeds in  the emergency room, we decided to admit the patient for observation and  repeat the tap in the morning.  This morning, at the bedside with the  patient in the seated position, I attempted a lumbar puncture  unsuccessfully.  I did not obtain any cerebrospinal fluid.  The patient had  some slight stiffness of his neck.  He was perfectly  lucid.  He, however,  still noted that he had the persistent headache.  Because of these abnormal  findings and the finding of xanthochromia on his spinal fluid tap, I advised  that the patient should undergo cerebral angiography.  He is now in the  hospital for this procedure.   PAST MEDICAL HISTORY:  His general health has been very good.  He reports no  significant medical problems whatsoever.  He does not take any medication on  a regular basis.   ALLERGIES:  He notes no allergies to any medications.   HABITS:  He notes that he smokes about a pack of cigarettes a day.  He  otherwise has been healthy.  Denies any previous surgeries.   PHYSICAL EXAMINATION:  VITAL SIGNS:  His blood pressure is 124/56, heart  rate  82, respirations 20, temperature is 97.4.  NECK:  He has 1+ meningismus in his neck.  No masses are palpable in his  neck.  No bruits are heard.  NEUROLOGIC:  Cranial nerve examination reveals the pupils are 3 mm, brisk,  reactive to light and accommodation.  The extraocular movements are full and  the face is symmetric to grimace.  Tongue and uvula are in the midline.  Sclerae and conjunctivae are clear.  The fundi reveal no evidence of a  subhyaloid hemorrhage but no spontaneous venous pulsations are seen in the  __________.  LUNGS:  Clear to auscultation.  HEART:  Regular rate and rhythm.  ABDOMEN:  Soft.  Bowel sounds are positive.  No masses are palpable.  EXTREMITIES:  No cyanosis, clubbing, or edema.  The patient's station and  gait are normal.  Reflexes are 2+ and symmetric in the biceps and triceps,  2+ in the patellae and the Achilles.  Babinski signs are downgoing.   IMPRESSION:  The patient has a history of headache after two day's time with  very modest meningismus and a spinal fluid tap that though initially sounds  traumatic reveals the presence of xanthochromia in the last tube.  This is  quite bothersome a finding though the patient's clinical findings  at this  time are very soft.  In light of this ambiguity and abnormality and the  patient's continued complaints of headaches, I have recommended that he  undergo a cerebral angiogram and this is now being arranged.                                               Earleen Newport, M.D.    Drucilla Schmidt  D:  12/13/2002  T:  12/14/2002  Job:  [id number]

## 2010-10-13 NOTE — Consult Note (Signed)
   NAME:  KHAI, SIGLER                          ACCOUNT NO.:  [id number]   MEDICAL RECORD NO.:  [id number]                   PATIENT TYPE:  EMS   LOCATION:  ED                                   FACILITY[facility]  [facility]   PHYSICIAN:  Earleen Newport, M.D.               DATE OF BIRTH:  Nov 09, 1955   DATE OF CONSULTATION:  12/12/2002  DATE OF DISCHARGE:  12/12/2002                                   CONSULTATION   REASON FOR CONSULTATION:  Headache, subarachnoid hemorrhage.   HISTORY OF PRESENT ILLNESS:  Mr. Inti Kroll is a 55 year old individual  who has had significant headache and has had three syncope episodes. He  underwent a CT scan of the brain which was within the limits of normal.  Lumbar puncture was performed, and this demonstrated the presence of  significant blood with no subsequent clearing; however, the last two  demonstrates that there is xanthochromia in the supernatant, and there are  2,250 red cells in that last tube. His glucose and protein are normal. The  patient did have slight meningismus. Because of the ambiguity of his tap and  the patient's history of significant headache with meningismus, he is now to  be admitted to undergo repeat Pap in the morning. History and physical will  be dictated under a separate cover.                                               Earleen Newport, M.D.    Drucilla Schmidt  D:  12/13/2002  T:  12/14/2002  Job:  [id number]

## 2010-10-13 NOTE — Consult Note (Signed)
NAME:  Charles Riggs, Charles Riggs                          ACCOUNT NO.:  [id number]   MEDICAL RECORD NO.:  [id number]                   PATIENT TYPE:  INP   LOCATION:  3002                                 FACILITY:  [facility]   PHYSICIAN:  Alyson Locket. Love, M.D.                 DATE OF BIRTH:  1956/05/20   DATE OF CONSULTATION:  12/13/2002  DATE OF DISCHARGE:                                   CONSULTATION   REFERRING PHYSICIAN:  Dr. Ricke Hey.   REASON FOR CONSULTATION:  This 55 year old, left-handed black married male  from [place], [place], seen at the request of Dr. Ricke Hey  for evaluation of syncope versus seizure.   HISTORY OF PRESENT ILLNESS:  Mr. Puff has a known prior history of  palpitations occurring as often as two to four times per day and as  infrequently as once every four days.  This has been evaluated in the past  and found to represent PVCs or PACs.  He has no known prior history of  syncope.  He does have a history of car sickness as a child, exercise  headaches as a child, and visual distortion as recently as November 2003,  lasting 30 to 45 minutes, suggestive of migraine.  Saturday, he dropped his  daughter off about 7:30, fed his dogs.  At about 7:30 a.m., passed out.  He  had about three syncopal episodes within a 2-hour period, one occurred about  7:30 when he went to the dog pen; another occurred within his house when he  was sweeping off the dirt from his feet, and a third occurred after he had  driven himself to [employer] in order to get something to eat, about 10:30  a.m.  He is fuzzy about his events between 7:30 a.m. and 7:30 a.m.  He  remembers that he went to feed the dogs; he remembers sweeping the floor,  but he does not remember anything in-between.  He states he awoke about  10:30 a.m., having been on the floor, maybe, as long as two hours and was  hungry and then went to [employer].  He apparently passed out on the way  into  [employer] and did not eat.  He next remembers being in the [facility] Emergency Room.  There, he underwent evaluation, including a CT  scan of the brain and spinal tap.  The spinal tap was a bloody spinal tap  with 2,250 red blood cells, glucose of 77, protein of 99.  Because of this,  he was transferred to Dr. Clarice Pole service and underwent an arteriogram on  December 13, 2002, which showed no evidence of an aneurysm.  A CT scan of the  brain the day of admission did not reveal any evidence of hemorrhage.  Patient denies any aura prior to his events of passing out.  He denies any  macropsy, micropsy, denies deja vu  strange odors or tastes.  He has no prior  history to suggest seizures.   His sodium is 139, potassium 3.6, chloride 107, CO2 content 25, glucose 98,  BUN 13, creatinine 1.4, calcium 8.9.  White blood cell count was 6,200;  hemoglobin was 13.9, hematocrit 40.6; platelet count was 219K.  His EKG  showed normal sinus rhythm and LVH.   He denies any history of drug or alcohol abuse.  He denies any family  history of seizures, and he denies any recent head or neck trauma except  positive when passing out while sweeping the floor of his home.   PHYSICAL EXAMINATION:  GENERAL:  Showed a well-developed black male,  confused.  After Versed and fentanyl for his cerebral arteriogram, he was  oriented x 3, would follow commands.  NEUROLOGIC:  His central nerve examination showed the discs to be flat.  The  extraocular movements are full, and corneas are present.  There was no  seventh nerve palsy.  Hearing was present.  Air conduction greater than bone  conduction.  Tongue is midline.  The uvula is midline.  Gag is present.  Sternocleidomastoid and trapezius testing on motor examination revealed 5/5  strength in the upper extremities.  The right leg was not fully evaluated,  but it seemed to be 5/5.  Sensory examination was intact to pinprick, touch,  proprioception, vibration  testing.  Deep tendon reflexes were 2+.  Plantar  responses were downgoing.  He had a loud, what I thought was, diastolic and  pulmonary insufficiency murmur with expiration in the left sternal border.   IMPRESSION:  1. Syncope versus seizure, code 780.2 versus A999333.  2. Diastolic heart murmur, code 785.2.  3. History of premature ventricular contractions, code 785.1.   PLAN:  The plan at this time is to obtain a 2-D echocardiogram, EEGs, CPK  and telemetry.                                               Alyson Locket. Erling Cruz, M.D.    JML/MEDQ  D:  12/13/2002  T:  12/14/2002  Job:  [id number]

## 2010-10-13 NOTE — Consult Note (Signed)
[facility]  Patient:    Charles Riggs, Charles Riggs                         MRN: [id number] Proc. Date: 09/10/00 Adm. Date:  [id number] Attending:  Jeanmarie Hubert H                          Consultation Report  HISTORY OF PRESENT ILLNESS:  I was asked to see this 55 year old black male who was actually seen in the [facility] on September 05, 2000 with history of tonsillitis and right tonsillar swelling.  CT scan of the neck with IV contrast showed an enlarged right tonsil but no signs of frank abscess.  He was given 2 g of Rocephin, IV Decadron 10 mg, and sent home on Augmentin 875 mg p.o. b.i.d. for two weeks total.  He did not follow up in our office per instructions.  He was reevaluated in the [facility] ER today and was noted to have a large right peritonsillar area, worsening of his difficulty swallowing, and fever.  We are now consulted regarding drainage of peritonsillar abscess.  PHYSICAL EXAMINATION:  GENERAL:  The patient is a well-developed, well-nourished 55 year old black male in no acute distress.  HEENT:  Head is normocephalic, atraumatic.  Eyes are PERRLA.  Extraocular muscles are intact.  Facial nerves intact bilaterally.  Ears:  Both TMs are intact without fluid.  External canals appear normal.  NECK:  Neck does show some mild tenderness in the jugulodigastric area but no frank masses or other thyromegaly are noted.  Oral cavity:  No evidence of trismus.  Large bulging of the right soft palate and peritonsillar area with shift of the uvula to the left.  Left tonsil appears normal.  Nasal examination is unremarkable.  PROCEDURE:  After obtaining informed consent, the right peritonsillar and soft palate area was anesthetized with topical Hurricaine spray and then about 3 cc of a 1% lidocaine solution with 1:1000 epinephrine was injected in the area. An 18-gauge needle on a 10-cc syringe was used to aspirate in the soft palate area on the right  side.  I did aspirate about 2 cc of mucopurulent material, which was sent for culture, sensitivity, and Gram stain.  This area was then incised with about a 0.5 cm length incision into this area with a 15-blade scalpel without difficulty spreading with a hemostat.  A tonsillar hemostat did relieve a lot of the mucopurulent material in this space, which was suctioned using a Yankauer suction.  Approximately 6 cc of mucopurulent material was relieved from the area and the patient tolerated this well without difficulty.  The airway was stable throughout the entire procedure.  ASSESSMENT:  A 55 year old black male with a right peritonsillar abscess, which was incised and drained here in the emergency room without difficulty.  RECOMMENDATIONS:  The patient will be given 2 L of IV fluid bolus in the ED. He will also be given 600 mg of clindamycin IV x 1.  Pain medication as needed.  He will continue with his Augmentin 875 mg p.o. b.i.d. for two weeks total.  He was also written a prescription for Lortab elixir 250 cc with two refills 10-15 cc p.o. for his p.r.n. pain and he is to push oral hydration at home.  FOLLOW-UP:  He is to call our office and instructions were given to him for followup appointment on Friday, April 19, and is  to call if he has any further questions or difficulties.  He may want to consider interval tonsillectomy at some point in the future.  DD:  09/10/00 TD:  09/10/00 Job: [id number]

## 2010-10-13 NOTE — Discharge Summary (Signed)
NAME:  Charles Riggs, Charles Riggs                          ACCOUNT NO.:  [id number]   MEDICAL RECORD NO.:  [id number]                   PATIENT TYPE:  INP   LOCATION:  3002                                 FACILITY:  [facility]   PHYSICIAN:  Alyson Locket. Love, M.D.                 DATE OF BIRTH:  18-Jan-1956   DATE OF ADMISSION:  12/13/2002  DATE OF DISCHARGE:  12/16/2002                                 DISCHARGE SUMMARY   REASON FOR ADMISSION:  This 55 year old left-handed black married male from  [place], [place], was seen on December 13, 2002, by myself at the  request of Dr. Ricke Hey for evaluation of syncope versus seizure.   HISTORY OF PRESENT ILLNESS:  Mr. Stolar reports that the morning of December 12, 2002, he had fed his dog at about 7:30 and apparently had a syncopal  episode.  He then was in his house, cleaning the floor from dirt that his  feet had brought in after feeding the dog, and then subsequently awoke on  the floor.  When he awoke, he was very hungry and drove himself to  [employer] at about 10:30 in the morning, and there he had a syncopal  episode or seizure while going into [employer].  He was taken to the [facility] emergency room where he complained of a headache.  He  underwent a spinal tap at that time, which was bloody, showing 2250 red  blood cells, glucose of 77, a protein of 99.  Because of this, he was  transferred to Dr. Tomasita Crumble service at [facility] and  admitted on December 13, 2002.   He had an arteriogram, which showed no evidence of an aneurysm or  arteriovenous malformation.  A CT scan the day of his admission showed no  evidence of bleed and no significant abnormality.  The patient has never had  any prior history of syncope, denied any associated chest pain, and denies  any warning episodes of syncope, such as macropsy, micropsy, da [employer],  strange odors or taste.  He has no history of drug or alcohol abuse and no  family  history of seizures.  He does have a personal history of PJCs and  PVCs, which have been evaluated in the past, with some vague arrhythmias  occurring, he states, as often as on a daily basis.   PAST MEDICAL HISTORY:  Remarkable for otherwise being in good health.   MEDICATIONS:  He does not take any medications.   ALLERGIES:  He has no known allergies.   SOCIAL HISTORY:  He does smoke cigarettes, approximately one pack per day.   PAST SURGICAL HISTORY:  There has been no history of surgical procedures.   FAMILY HISTORY:  Details of his family history have been dictated elsewhere.   PHYSICAL EXAMINATION:  GENERAL:  His examination at the time that I saw him  revealed a well-developed black male in no acute distress.  VITAL SIGNS:  His blood pressure was normal, and he was afebrile.  NECK:  He had no carotid or supraclavicular bruits, and his neck was not  stiff.  NEUROLOGIC:  Mental status, he was alert and oriented times three and  followed one-, two-, and three-step commands.  His cranial nerve examination  was normal.  His motor examination was normal.  His sensory examination was  normal.  Deep tendon reflexes were 1 to 2+, and plantar responses were  downgoing.  HEART:  His general examination was remarkable for a diastolic murmur heard  best during exploration at the left lower sternal border.   LABORATORY DATA:  His CSF showed tube 1, fluid cloudy, white blood cells 20,  supernate and slightly pink, glucose 77, protein 99, red blood cells  154,000, 62 color pink, clarity light, xanthochromic, white blood cells 8,  red blood cells 2250.  Cerebral arteriogram was negative without any source  of subarachnoid hemorrhage.  CT scan at [facility] emergency room  was unremarkable.  His sodium was 139, potassium 3.6, chloride 107, CO2  content 25, glucose 98, BUN 13, creatinine 1.4, calcium 8.9.  White blood  cell count was 6200, hemoglobin 13.9, hematocrit 40.6, platelet  count  219,000.  EKG showed a normal sinus rhythm and LVH.  Telemetry showed a  normal sinus rhythm.  A 2-D echocardiogram showed overall left ventricular  function normal, left ventricular ejection fraction estimated at 55 to 65%,  aortic valve thickness was mildly increased, left atrium was mildly dilated.  Initial CK was performed without cardiac isoenzymes on December 14, 2002, and  was 376.  RPR nonreactive.  T4 was 8.7, TSH 2.896.  Repeat CKs times two  revealed a CK of 271, the MB 3.2, relative index 1.2, troponins negative.  Fibrin derivatives D-dimer 0.43.  EEGs times two raised question of some  left temporal shock waves on the first EEG but not seen on the second EEG.  MRI study of the brain with and without contrast enhancement is pending at  the time of discharge.   IMPRESSION:  1. Syncope, code 780.2, versus seizure, code 345.10.  2. Aortic insufficiency, code 785.2.  3. History of premature ventricular contractions, code 785.1.  4. Mildly elevated creatine phosphokinase.  5. Bloody spinal fluid tap.   PLAN:  Plan at this time is to obtain an MRI study of the brain, discharge  him for event monitor as an outpatient, return to me in one week.  He is not  to drive a car.  He is discharged on seizure precautions.                                               Alyson Locket. Erling Cruz, M.D.    JML/MEDQ  D:  12/16/2002  T:  12/16/2002  Job:  [id number]   cc:   Earleen Newport, M.D.  [place]  [place] [address]  Fax: [phone]   Rushie Nyhan, M.D.  [phone]  [place] [address]  Fax: [phone]   Lucianne Lei, M.D.  [phone] [address], [address]  [place] [address]  Fax: [phone]   Quay Burow, M.D.  [phone] [address], [place]  [place] [address]  Fax: [phone]  cc:   Earleen Newport, M.D.  [place]  [place] [address]  Fax: [phone]   Rushie Nyhan, M.D. [phone] [address]  [place] [address]  Fax:  [phone]   Lucianne Lei, M.D.  [phone] [address]  [place] [address]  Fax: [phone]   Quay Burow, M.D.  [phone] [address], [place]  [place] [address]  Fax: [phone]

## 2010-10-13 NOTE — Assessment & Plan Note (Signed)
[name]                               PULMONARY OFFICE NOTE   PURNELL, HIJAZI                       MRN:          [id number]  DATE:02/20/2006                            DOB:          04/23/1956    REASON FOR CONSULTATION:  We are asked by Dr. Kathlene November to evaluate [name] for shortness of breath and asthma.   BRIEF HISTORY:  Charles Riggs is a 55 year old gentleman with little past  medical history beyond tonsillectomy and childhood hernia operation. He  states that he was well until approximately nine months ago when he  experienced a significant dust exposure while at work. 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 During this dust exposure he began  to experience some cough, wheezing, and chest tightness with shortness of  breath. He has since had recurrent episodes that have been similar with  lower level exposures to the same conditions. He has identified other  triggers including exercise. He has been using albuterol prior to playing  basketball and other exercise which has helped his breathing. He has been  seen and evaluated by Dr. Larose Kells and was started on Advair 250/50 b.i.d. This  did help his breathing and his cough and wheeze, but he continued to have  symptoms particularly related to his dust exposure. After his Advair ran  out, he noticed that he began to require his rescue albuterol 3-4 times  daily. He is referred now for further therapy and management.   PAST MEDICAL HISTORY:  1. Tonsillectomy.  2. Hernia surgery as a child.  3. Tobacco use.  4. History of renal stone.   ALLERGIES:  No known drug allergies.   MEDICATIONS:  Albuterol 2 puffs q.4 h. p.r.n. for shortness of breath.   SOCIAL HISTORY:  The patient is married. Originally from [place]. Has lived  in [place] for many years. He smokes one pack of cigarettes daily and  has done so for the last five years. He is very interested in quitting.  He  has two children. He has worked in a [employer] as a Warden/ranger as  mentioned with dust exposure. He has used a mask and has had some  improvement in his exposure. Related symptoms while wearing this. He was in  [name] and worked as a anomerut was discharged for medical reasons not  related to his breathing. He may have had some prior pesticide exposure in  the past as part of his job. 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 He denies any TB  exposure.   FAMILY HISTORY:  Significant for cancer in his grandmother of unclear type.   REVIEW OF SYSTEMS:  As per the HPI. Also please note he has had some  sneezing and nonproductive cough.   PHYSICAL EXAMINATION:  GENERAL:  This pleasant well-appearing gentleman in  no distress on room air.  VITAL SIGNS:  Weight 243 pounds, temperature 98, blood pressure 120/68,  heart rate 80, SpO2 96% on room  air.  HEENT:  Oropharynx is clear with only posteropharyngeal erythema. Pupils are  equal, round, react to light and accommodation.  NECK:  Supple without JVD.  LUNGS:  Clear to auscultation bilaterally. There is no wheezing on forced  expiration.  HEART:  Regular rate and rhythm without murmur, rub or gallop.  ABDOMEN:  Soft, nontender, nondistended with positive bowel sounds.  EXTREMITIES:  Have no cyanosis, clubbing or edema.  NEUROLOGICAL:  Grossly nonfocal.   Chest x-ray was reviewed from May 2007. This showed no evidence of  infiltrate, lymphadenopathy or effusions and was for all practical purposes  normal.   IMPRESSION:  1. Probable asthma with suspected occupational exposure component that has      made it refractory to initial therapy.  2. Tobacco use.   PLAN:  1. I would restart Charles Riggs Advair and will do so today. He will use      albuterol on an as needed basis.  2. Full pulmonary function testing. Pre and post bronchodilator to      establish diagnosis  of asthma. If this is negative for air flow      limitation, then it may be necessary to perform a provocational study      using the particular dusts at his work environment with the assistance      of occupational health providers at his work.  3. I believe that he would likely benefit from a mask to prevent dust      exposure at work.  4. We may need to work with occupational health at his work if mask is      unsuccessful in preventing symptoms in order to find him a different      work environment with the same company.  5. Charles Riggs will follow with me on October 22 after his pulmonary      function testing is completed and will call me if he has any      difficulties in the interim.            ______________________________  Collene Gobble, MD     RSB/MedQ  DD:  02/20/2006  DT:  02/22/2006  Job #:  [id number]   cc:   Kathlene November, MD

## 2011-02-22 LAB — DIFFERENTIAL
Basophils Relative: 1
Eosinophils Absolute: 0.2
Eosinophils Relative: 4
Lymphs Abs: 1.7

## 2011-02-22 LAB — CBC
HCT: 39.3
MCHC: 33.6
MCV: 84.9
Platelets: 196
WBC: 4

## 2011-02-22 LAB — POCT I-STAT, CHEM 8
Creatinine, Ser: 1.4
Glucose, Bld: 88
HCT: 41
Hemoglobin: 13.9
Sodium: 138
TCO2: 26

## 2011-02-22 LAB — POCT CARDIAC MARKERS
CKMB, poc: 2.2
Myoglobin, poc: 63.5

## 2016-05-20 ENCOUNTER — Emergency Department (HOSPITAL_COMMUNITY)
Admission: EM | Admit: 2016-05-20 | Discharge: 2016-05-20 | Disposition: A | Discharge status: Home / Self Care | Payer: Self-pay | Attending: Emergency Medicine | Admitting: Emergency Medicine

## 2016-05-20 ENCOUNTER — Encounter (HOSPITAL_COMMUNITY): Payer: Self-pay

## 2016-05-20 DIAGNOSIS — Y939 Activity, unspecified: Secondary | ICD-10-CM | POA: Insufficient documentation

## 2016-05-20 DIAGNOSIS — S30811A Abrasion of abdominal wall, initial encounter: Secondary | ICD-10-CM | POA: Insufficient documentation

## 2016-05-20 DIAGNOSIS — J45909 Unspecified asthma, uncomplicated: Secondary | ICD-10-CM | POA: Insufficient documentation

## 2016-05-20 DIAGNOSIS — T148XXA Other injury of unspecified body region, initial encounter: Secondary | ICD-10-CM

## 2016-05-20 DIAGNOSIS — X58XXXA Exposure to other specified factors, initial encounter: Secondary | ICD-10-CM | POA: Insufficient documentation

## 2016-05-20 DIAGNOSIS — Y929 Unspecified place or not applicable: Secondary | ICD-10-CM | POA: Insufficient documentation

## 2016-05-20 DIAGNOSIS — Y999 Unspecified external cause status: Secondary | ICD-10-CM | POA: Insufficient documentation

## 2016-05-20 HISTORY — DX: Unspecified asthma, uncomplicated: J45.909

## 2016-05-20 MED ORDER — CEPHALEXIN 500 MG PO CAPS: 500.0000 mg | ORAL_CAPSULE | Freq: Four times a day (QID) | ORAL | 0 refills | Status: DC

## 2016-05-20 MED ORDER — BACITRACIN ZINC 500 UNIT/GM EX OINT: 1.0000 "application " | TOPICAL_OINTMENT | Freq: Two times a day (BID) | CUTANEOUS | 0 refills | Status: DC

## 2016-05-20 NOTE — Discharge Instructions (Signed)
Medications: Bacitracin, Keflex  Treatment: Apply bacitracin ointment twice daily. If you don't see improvement to the area over 2 days, you can begin taking Keflex as prescribed for 5 days.  Follow-up: Please follow-up and establish care with a primary care provider by calling the number circled on your discharge paperwork. Please return to emergency department if you develop any new or worsening symptoms.

## 2016-05-20 NOTE — ED Triage Notes (Signed)
PT C/O AN ABRASION TO THE LEFT FLANK X4 DAYS. PT STS HE WAS SCRATCHING HIMSELF WITH A PLASTIC LOOFA, WHEN THE HANDLE BROKE AND SCRATCHED HIM ON HIS LEFT SIDE. PT STS THE AREA HURTS, HAS BEEN RED, AND INFLAMED.  HE HAS BEEN CLEANING THE AREA WITH PEROXIDE, W/O RELIEF.

## 2016-05-20 NOTE — ED Provider Notes (Signed)
[facility] DEPT Provider Note   CSN: [id number] Arrival date & time: 05/20/16  1702  By signing my name below, I, Reola Mosher, attest that this documentation has been prepared under the direction and in the presence of Eliezer Mccoy, PA-C.  Electronically Signed: Reola Mosher, ED Scribe. 05/20/16. 6:30 PM.  History   Chief Complaint Chief Complaint  Patient presents with  . Abrasion   Patient gave verbal permission to utilize photo for medical documentation only. The image was not stored on any personal device.   The history is provided by the patient. No language interpreter was used.   HPI Comments: Charles Riggs is a 60 y.o. male with a PMHx of GERD and asthma, who presents to the Emergency Department complaining of gradually improving wound to the left-sided flank region, w/ associated redness and pain over the area sustained approximately four days ago. Pt reports that four days ago he was in the shower using a plastic loofa, and at that time the plastic handle of the loofa broke and came across his left flank area, sustaining a wound to the area. Pt notes that over the past three days that increased pain and redness to the area has been present, however, these have been improving. He states that his fiance has been applying hydrogen peroxide, rubbing alcohol, And Neosporin over the area with mild improvement of his symptoms. Pt additionally notes that he had a subjective fever several nights ago directly following this incident, but states that it has since resolved and not returned. Pt also notes that he has had intermittent episodes of right-sided, lower abdominal pain over the past several days as well. He describes this pain as burning. No pain while in the ED. He has been taking anti-flattus medications at home with relief, but states that his pain returns. Pt is not currently followed by a PCP. He denies shortness of breath, chest pain, nausea, vomiting, or any  other associated symptoms.   Past Medical History:  Diagnosis Date  . Asthma    Patient Active Problem List   Diagnosis Date Noted  . LEG PAIN 04/26/2010  . DEPRESSION 08/19/2009  . RENAL CALCULUS 01/05/2009  . DM 10/12/2008  . COCAINE ABUSE, HX OF 06/16/2007  . ASTHMA 03/24/2007  . GERD 03/24/2007   Past Surgical History:  Procedure Laterality Date  . HERNIA REPAIR      Home Medications    Prior to Admission medications   Medication Sig Start Date End Date Taking? Authorizing Provider  bacitracin ointment Apply 1 application topically 2 (two) times daily. 05/20/16   Frederica Kuster, PA-C  cephALEXin (KEFLEX) 500 MG capsule Take 1 capsule (500 mg total) by mouth 4 (four) times daily. 05/20/16   Frederica Kuster, PA-C   Family History History reviewed. No pertinent family history.  Social History Social History  Substance Use Topics  . Smoking status: Never Smoker  . Smokeless tobacco: Never Used  . Alcohol use No   Allergies   Patient has no known allergies.  Review of Systems Review of Systems  Constitutional: Positive for fever (subjective, resolved). Negative for chills.  HENT: Negative for facial swelling and sore throat.   Respiratory: Negative for shortness of breath.   Cardiovascular: Negative for chest pain.  Gastrointestinal: Positive for abdominal pain. Negative for nausea and vomiting.  Genitourinary: Negative for dysuria.  Musculoskeletal: Positive for myalgias. Negative for back pain.  Skin: Positive for color change and wound. Negative for rash.  Neurological: Negative  for headaches.  Psychiatric/Behavioral: The patient is not nervous/anxious.    Physical Exam Updated Vital Signs BP 157/62 (BP Location: Right Arm)   Pulse 93   Temp 98 F (36.7 C) (Oral)   Resp 18   Ht 5\' 11"  (1.803 m)   Wt 115.2 kg   SpO2 100%   BMI 35.43 kg/m   Physical Exam  Constitutional: He appears well-developed and well-nourished. No distress.  HENT:  Head:  Normocephalic and atraumatic.  Mouth/Throat: Oropharynx is clear and moist. No oropharyngeal exudate.  Eyes: Conjunctivae are normal. Pupils are equal, round, and reactive to light. Right eye exhibits no discharge. Left eye exhibits no discharge. No scleral icterus.  Neck: Normal range of motion. Neck supple. No thyromegaly present.  Cardiovascular: Normal rate, regular rhythm, normal heart sounds and intact distal pulses.  Exam reveals no gallop and no friction rub.   No murmur heard. Pulmonary/Chest: Effort normal and breath sounds normal. No stridor. No respiratory distress. He has no wheezes. He has no rales.  Abdominal: Soft. Bowel sounds are normal. He exhibits no distension. There is no rebound and no guarding.  Musculoskeletal: He exhibits no edema.  Lymphadenopathy:    He has no cervical adenopathy.  Neurological: He is alert. Coordination normal.  Skin: Skin is warm and dry. No rash noted. He is not diaphoretic. There is erythema. No pallor.  Linear abrasion to left-lower flank. Mild erythema and tenderness. No drainage, fluctuance, induration. See image.   Psychiatric: He has a normal mood and affect.  Nursing note and vitals reviewed.     ED Treatments / Results  DIAGNOSTIC STUDIES: Oxygen Saturation is 100% on RA, normal by my interpretation.   COORDINATION OF CARE: 6:28 PM-Discussed next steps with pt. Pt verbalized understanding and is agreeable with the plan.   Labs (all labs ordered are listed, but only abnormal results are displayed) Labs Reviewed - No data to display  EKG  EKG Interpretation None      Radiology No results found.  Procedures Procedures   Medications Ordered in ED Medications - No data to display  Initial Impression / Assessment and Plan / ED Course  I have reviewed the triage vital signs and the nursing notes.  Pertinent labs & imaging results that were available during my care of the patient were reviewed by me and considered in  my medical decision making (see chart for details).  Clinical Course    Patient with mild erythema surrounding wound. Improving from photo patient showed me. Will try bacitracin for 2 days. Also discharged home with prescription for Keflex if  The area is not continuing to improve. Patient with no abdominal pain or tenderness on exam. Repeat abdominal exam showed no right lower quadrant tenderness either. I don't believe patient needs further workup for his abdominal pain at this time. However, patient was given strict return precautions regarding his abdominal pain and wound. Patient understands and agrees with plan. Patient advised to follow up and establish care with a primary care provider. Patient vitals stable throughout ED course and discharged in satisfactory condition. I discussed patient case with Dr. Regenia Skeeter who guided the patient's management and agrees with plan.  Final Clinical Impressions(s) / ED Diagnoses   Final diagnoses:  Abrasion   New Prescriptions Current Discharge Medication List    START taking these medications   Details  bacitracin ointment Apply 1 application topically 2 (two) times daily. Qty: 120 g, Refills: 0    cephALEXin (KEFLEX) 500 MG capsule Take  1 capsule (500 mg total) by mouth 4 (four) times daily. Qty: 20 capsule, Refills: 0       I personally performed the services described in this documentation, which was scribed in my presence. The recorded information has been reviewed and is accurate.     Frederica Kuster, PA-C 05/20/16 [place], MD 05/21/16 [phone]

## 2016-07-17 ENCOUNTER — Emergency Department (HOSPITAL_COMMUNITY)
Admission: EM | Admit: 2016-07-17 | Discharge: 2016-07-17 | Disposition: A | Discharge status: Home / Self Care | Payer: BLUE CROSS/BLUE SHIELD | Attending: Emergency Medicine | Admitting: Emergency Medicine

## 2016-07-17 ENCOUNTER — Emergency Department (HOSPITAL_COMMUNITY): Payer: BLUE CROSS/BLUE SHIELD

## 2016-07-17 ENCOUNTER — Encounter (HOSPITAL_COMMUNITY): Payer: Self-pay | Admitting: Emergency Medicine

## 2016-07-17 DIAGNOSIS — Z79899 Other long term (current) drug therapy: Secondary | ICD-10-CM | POA: Insufficient documentation

## 2016-07-17 DIAGNOSIS — J45909 Unspecified asthma, uncomplicated: Secondary | ICD-10-CM | POA: Insufficient documentation

## 2016-07-17 DIAGNOSIS — R319 Hematuria, unspecified: Secondary | ICD-10-CM

## 2016-07-17 DIAGNOSIS — R55 Syncope and collapse: Secondary | ICD-10-CM | POA: Diagnosis not present

## 2016-07-17 DIAGNOSIS — R42 Dizziness and giddiness: Secondary | ICD-10-CM | POA: Diagnosis present

## 2016-07-17 LAB — URINALYSIS, ROUTINE W REFLEX MICROSCOPIC
Bacteria, UA: NONE SEEN
Bilirubin Urine: NEGATIVE
GLUCOSE, UA: NEGATIVE mg/dL
Ketones, ur: NEGATIVE mg/dL
Leukocytes, UA: NEGATIVE
Nitrite: NEGATIVE
PH: 5 (ref 5.0–8.0)
Protein, ur: NEGATIVE mg/dL
SPECIFIC GRAVITY, URINE: 1.018 (ref 1.005–1.030)
Squamous Epithelial / LPF: NONE SEEN

## 2016-07-17 LAB — BASIC METABOLIC PANEL
Anion gap: 5 (ref 5–15)
BUN: 15 mg/dL (ref 6–20)
CALCIUM: 8.8 mg/dL — AB (ref 8.9–10.3)
CO2: 27 mmol/L (ref 22–32)
Chloride: 107 mmol/L (ref 101–111)
Creatinine, Ser: 1.23 mg/dL (ref 0.61–1.24)
GFR calc non Af Amer: >60 mL/min (ref >60)
Glucose, Bld: 99 mg/dL (ref 65–99)
Potassium: 4 mmol/L (ref 3.5–5.1)
Sodium: 139 mmol/L (ref 135–145)

## 2016-07-17 LAB — CBC
HCT: 39.1 % (ref 39.0–52.0)
Hemoglobin: 12.7 g/dL — ABNORMAL LOW (ref 13.0–17.0)
MCH: 26.7 pg (ref 26.0–34.0)
MCHC: 32.5 g/dL (ref 30.0–36.0)
MCV: 82.3 fL (ref 78.0–100.0)
PLATELETS: 242 10*3/uL (ref 150–400)
RBC: 4.75 MIL/uL (ref 4.22–5.81)
RDW: 14.1 % (ref 11.5–15.5)
WBC: 5.2 10*3/uL (ref 4.0–10.5)

## 2016-07-17 LAB — CBG MONITORING, ED: Glucose-Capillary: 85 mg/dL (ref 65–99)

## 2016-07-17 NOTE — Discharge Instructions (Addendum)
Take calcium supplementation (Tums two tablets twice a day) and see your primary doctor for further evaluation of your calcium.

## 2016-07-17 NOTE — ED Provider Notes (Signed)
[facility] DEPT Provider Note   CSN: [id number] Arrival date & time: 07/17/16  9892     History   Chief Complaint Chief Complaint  Patient presents with  . Near Syncope    HPI Charles Riggs is a 61 y.o. male.  Patient presents to the emergency department for evaluation of dizziness and near syncope. Patient reports that he was at work when this occurred. 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 This made him feel briefly dizzy but then it resolved. He went into the work room, sat down and had a cold drink. When he stood up and began to walk out of the work room he became acutely more dizzy. He grabbed onto a railing because he felt like he was going to pass out. He reports that his supervisor caught him when he nearly fell. It was not complete loss of consciousness, but he was briefly confused. No seizure activity. No chest pain or shortness of breath.      Past Medical History:  Diagnosis Date  . Asthma     Patient Active Problem List   Diagnosis Date Noted  . LEG PAIN 04/26/2010  . DEPRESSION 08/19/2009  . RENAL CALCULUS 01/05/2009  . DM 10/12/2008  . COCAINE ABUSE, HX OF 06/16/2007  . ASTHMA 03/24/2007  . GERD 03/24/2007    Past Surgical History:  Procedure Laterality Date  . HERNIA REPAIR         Home Medications    Prior to Admission medications   Medication Sig Start Date End Date Taking? Authorizing Provider  bacitracin ointment Apply 1 application topically 2 (two) times daily. Patient not taking: Reported on 07/17/2016 05/20/16   Bea Graff Law, PA-C  cephALEXin (KEFLEX) 500 MG capsule Take 1 capsule (500 mg total) by mouth 4 (four) times daily. Patient not taking: Reported on 07/17/2016 05/20/16   Frederica Kuster, PA-C    Family History History reviewed. No pertinent family history.  Social History Social History  Substance  Use Topics  . Smoking status: Never Smoker  . Smokeless tobacco: Never Used  . Alcohol use No     Allergies   Patient has no known allergies.   Review of Systems Review of Systems   Physical Exam Updated Vital Signs BP 131/66 (BP Location: Left Arm)   Pulse 87   Temp 98.1 F (36.7 C) (Oral)   Resp 20   Ht 5\' 11"  (1.803 m)   Wt 255 lb (115.7 kg)   SpO2 99%   BMI 35.57 kg/m   Physical Exam   ED Treatments / Results  Labs (all labs ordered are listed, but only abnormal results are displayed) Labs Reviewed  BASIC METABOLIC PANEL - Abnormal; Notable for the following:       Result Value   Calcium 8.8 (*)    All other components within normal limits  CBC - Abnormal; Notable for the following:    Hemoglobin 12.7 (*)    All other components within normal limits  URINALYSIS, ROUTINE W REFLEX MICROSCOPIC - Abnormal; Notable for the following:    Hgb urine dipstick MODERATE (*)    All other components within normal limits  CBG MONITORING, ED    EKG  EKG Interpretation  Date/Time:  Tuesday July 17 2016 05:12:11 EST Ventricular Rate:  70 PR Interval:    QRS Duration: 114 QT Interval:  448 QTC Calculation: 484 R Axis:   45 Text Interpretation:  Sinus rhythm Incomplete left bundle branch block LVH with secondary repolarization abnormality Anterior Q waves, possibly due to LVH No significant change since last tracing Confirmed by Kashlynn Kundert  MD, Texie Tupou [phone]) on 07/17/2016 5:32:37 AM       Radiology Ct Head Wo Contrast  Result Date: 07/17/2016 CLINICAL DATA:  Lightheaded and syncopal episode. EXAM: CT HEAD WITHOUT CONTRAST TECHNIQUE: Contiguous axial images were obtained from the base of the skull through the vertex without intravenous contrast. COMPARISON:  11/22/2007 FINDINGS: Brain: No evidence for acute hemorrhage, mass lesion, midline shift, hydrocephalus or large infarct. Vascular: No hyperdense vessel or unexpected calcification. Skull: Normal. Negative  for fracture or focal lesion. Sinuses/Orbits: No acute finding. Other: None. IMPRESSION: Negative head CT. Electronically Signed   By: Markus Daft M.D.   On: 07/17/2016 07:19    Procedures Procedures (including critical care time)  Medications Ordered in ED Medications - No data to display   Initial Impression / Assessment and Plan / ED Course  I have reviewed the triage vital signs and the nursing notes.  Pertinent labs & imaging results that were available during my care of the patient were reviewed by me and considered in my medical decision making (see chart for details).     Patient presents to the emergency department for near-syncope. Patient was at work when this episode occurred. He did have a warning in the form of acute dizziness lightheadedness and then felt like he was going to pass out. No associated cardiac symptoms. No respiratory symptoms to suggest PE.  At arrival to the ER, patient is feeling back to his normal self other than having some intermittent cramping of his hands and feet. This might be symptomatic secondary to mild hypocalcemia, unclear etiology. This will require further outpatient workup. Remainder of his workup was unremarkable. CT head does not show any acute abnormality. Neurologic exam is normal. Patient declined IV and IV fluids. He is tolerating oral intake. Will hydrate himself today. He does have too numerous to count red blood cells in his urine. This has been seen previously. Refer to urology.  Final Clinical Impressions(s) / ED Diagnoses   Final diagnoses:  Near syncope  Hypocalcemia  Hematuria, unspecified type    New Prescriptions New Prescriptions   No medications on file     Orpah Greek, MD 07/17/16 [phone]

## 2016-07-17 NOTE — ED Triage Notes (Signed)
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

## 2016-07-29 ENCOUNTER — Emergency Department (HOSPITAL_COMMUNITY)
Admission: EM | Admit: 2016-07-29 | Discharge: 2016-07-29 | Disposition: A | Discharge status: Home / Self Care | Payer: BLUE CROSS/BLUE SHIELD | Attending: Emergency Medicine | Admitting: Emergency Medicine

## 2016-07-29 ENCOUNTER — Emergency Department (HOSPITAL_COMMUNITY): Payer: BLUE CROSS/BLUE SHIELD

## 2016-07-29 ENCOUNTER — Encounter (HOSPITAL_COMMUNITY): Payer: Self-pay

## 2016-07-29 DIAGNOSIS — M25511 Pain in right shoulder: Secondary | ICD-10-CM | POA: Diagnosis not present

## 2016-07-29 DIAGNOSIS — J45909 Unspecified asthma, uncomplicated: Secondary | ICD-10-CM | POA: Diagnosis not present

## 2016-07-29 DIAGNOSIS — Y92410 Unspecified street and highway as the place of occurrence of the external cause: Secondary | ICD-10-CM | POA: Insufficient documentation

## 2016-07-29 DIAGNOSIS — M545 Low back pain: Secondary | ICD-10-CM | POA: Diagnosis not present

## 2016-07-29 DIAGNOSIS — Y939 Activity, unspecified: Secondary | ICD-10-CM | POA: Diagnosis not present

## 2016-07-29 DIAGNOSIS — R0781 Pleurodynia: Secondary | ICD-10-CM | POA: Insufficient documentation

## 2016-07-29 DIAGNOSIS — Y999 Unspecified external cause status: Secondary | ICD-10-CM | POA: Diagnosis not present

## 2016-07-29 DIAGNOSIS — M542 Cervicalgia: Secondary | ICD-10-CM | POA: Insufficient documentation

## 2016-07-29 DIAGNOSIS — M546 Pain in thoracic spine: Secondary | ICD-10-CM | POA: Diagnosis not present

## 2016-07-29 MED ORDER — IBUPROFEN 600 MG PO TABS: 600.0000 mg | ORAL_TABLET | Freq: Four times a day (QID) | ORAL | 0 refills | Status: DC | PRN

## 2016-07-29 MED ORDER — METHOCARBAMOL 500 MG PO TABS: 500.0000 mg | ORAL_TABLET | Freq: Two times a day (BID) | ORAL | 0 refills | Status: DC

## 2016-07-29 MED ORDER — IBUPROFEN 200 MG PO TABS
600.0000 mg | ORAL_TABLET | Freq: Once | ORAL | Status: AC
  Administered 2016-07-29: 600 mg via ORAL
  Filled 2016-07-29: qty 3

## 2016-07-29 MED ORDER — METHOCARBAMOL 500 MG PO TABS
500.0000 mg | ORAL_TABLET | Freq: Once | ORAL | Status: AC
  Administered 2016-07-29: 500 mg via ORAL
  Filled 2016-07-29: qty 1

## 2016-07-29 NOTE — Discharge Instructions (Signed)
Take your medications as prescribed. I also recommend applying ice and/or heat to affected area for 15-20 minutes 3-4 times daily for additional pain relief. Refrain from doing any heavy lifting, squatting or repetitive movements that exacerbate your symptoms. Follow-up with your primary care provider in the next week if her symptoms have not improved.  Please return to the Emergency Department if symptoms worsen or new onset of fever, numbness, tingling, groin anesthesia, loss of bowel or bladder, weakness, chest pain, difficulty breathing, coughing up blood.
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
## 2016-12-10 ENCOUNTER — Emergency Department (HOSPITAL_COMMUNITY): Payer: BLUE CROSS/BLUE SHIELD

## 2016-12-10 ENCOUNTER — Emergency Department (HOSPITAL_COMMUNITY)
Admission: EM | Admit: 2016-12-10 | Discharge: 2016-12-10 | Disposition: A | Discharge status: Home / Self Care | Payer: BLUE CROSS/BLUE SHIELD | Attending: Emergency Medicine | Admitting: Emergency Medicine

## 2016-12-10 ENCOUNTER — Encounter (HOSPITAL_COMMUNITY): Payer: Self-pay | Admitting: Oncology

## 2016-12-10 DIAGNOSIS — R0602 Shortness of breath: Secondary | ICD-10-CM | POA: Diagnosis present

## 2016-12-10 DIAGNOSIS — J45901 Unspecified asthma with (acute) exacerbation: Secondary | ICD-10-CM | POA: Insufficient documentation

## 2016-12-10 MED ORDER — IPRATROPIUM-ALBUTEROL 0.5-2.5 (3) MG/3ML IN SOLN
3.0000 mL | Freq: Once | RESPIRATORY_TRACT | Status: AC
  Administered 2016-12-10: 3 mL via RESPIRATORY_TRACT
  Filled 2016-12-10: qty 3

## 2016-12-10 MED ORDER — ALBUTEROL SULFATE HFA 108 (90 BASE) MCG/ACT IN AERS: 2.0000 | INHALATION_SPRAY | Freq: Once | RESPIRATORY_TRACT | Status: DC

## 2016-12-10 MED ORDER — PREDNISONE 20 MG PO TABS
60.0000 mg | ORAL_TABLET | Freq: Once | ORAL | Status: AC
  Administered 2016-12-10: 60 mg via ORAL
  Filled 2016-12-10: qty 3

## 2016-12-10 MED ORDER — BUDESONIDE-FORMOTEROL FUMARATE 160-4.5 MCG/ACT IN AERO: 2.0000 | INHALATION_SPRAY | Freq: Two times a day (BID) | RESPIRATORY_TRACT | 12 refills | Status: DC

## 2016-12-10 MED ORDER — COMBIVENT RESPIMAT 20-100 MCG/ACT IN AERS: 2.0000 | INHALATION_SPRAY | Freq: Every day | RESPIRATORY_TRACT | 0 refills | Status: DC | PRN

## 2016-12-10 MED ORDER — PREDNISONE 20 MG PO TABS: 60.0000 mg | ORAL_TABLET | Freq: Every day | ORAL | 0 refills | Status: AC

## 2016-12-10 NOTE — ED Notes (Signed)
Patient ambulated in hall. O2 sats maintained at 98% RA. 99%RA prior to ambulation. Denies shortness of breath or discomfort.

## 2016-12-10 NOTE — ED Notes (Signed)
Patient transported to X-ray 
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
## 2016-12-10 NOTE — Discharge Instructions (Signed)
Medications: Symbicort, Combivent, albuterol inhaler, prednisone  Treatment: Resume taking your inhalers as prescribed. Take prednisone for 4 days starting tomorrow. I recommend wearing a mask whenever you are working with chemicals or dust.  Follow-up: Please follow-up with your primary care provider in 2 days for recheck of symptoms. Please return to emergency department if you develop any new or worsening symptoms.

## 2016-12-10 NOTE — ED Triage Notes (Signed)
Pt was mixing chemicals last night.  When pt woke up this AM he was shob, coughing and had a HA.

## 2017-03-01 ENCOUNTER — Emergency Department (HOSPITAL_COMMUNITY)
Admission: EM | Admit: 2017-03-01 | Discharge: 2017-03-01 | Disposition: A | Discharge status: Home / Self Care | Payer: BLUE CROSS/BLUE SHIELD | Attending: Emergency Medicine | Admitting: Emergency Medicine

## 2017-03-01 ENCOUNTER — Encounter (HOSPITAL_COMMUNITY): Payer: Self-pay | Admitting: Emergency Medicine

## 2017-03-01 DIAGNOSIS — Z79899 Other long term (current) drug therapy: Secondary | ICD-10-CM | POA: Insufficient documentation

## 2017-03-01 DIAGNOSIS — R200 Anesthesia of skin: Secondary | ICD-10-CM | POA: Diagnosis not present

## 2017-03-01 DIAGNOSIS — M25532 Pain in left wrist: Secondary | ICD-10-CM | POA: Diagnosis not present

## 2017-03-01 DIAGNOSIS — J45909 Unspecified asthma, uncomplicated: Secondary | ICD-10-CM | POA: Diagnosis not present

## 2017-03-01 DIAGNOSIS — E119 Type 2 diabetes mellitus without complications: Secondary | ICD-10-CM | POA: Insufficient documentation
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
## 2017-03-01 NOTE — ED Provider Notes (Signed)
[facility] DEPT Provider Note   CSN: [id number] Arrival date & time: 03/01/17  0046     History   Chief Complaint Chief Complaint  Patient presents with  . Insect Bite    HPI Charles Riggs is a 61 y.o. male with a h/o of asthma who presents to the emergency department with a chief complaint of spider bite. The patient reports that 4 days ago he noticed a spider crawling out from under his pillow. He denies being bitten at that time. He reports that last night ~ 7:30 PM, prior to going to work, he was laying in his bed when he felt a pinch above his left wrist followed by increased pain, redness, and swelling of the left wrist and forearm. He states it felt as if something was crawling under his skin. He reports the symptoms persisted and when he arrived at work he was standing when he suddenly felt his entire right leg go numb with some associated tingling in his right toes. His supervisor became concerned and advised him to come to the emergency department for evaluation. He reports that the swelling in his left wrist and crawling sensation has improved and his right leg still "feels as if it's asleep", but is improving. He also complains of a pounding headache that began 2 days ago while he was at an appointment for the clinical trial he recently enrolled in for his asthma. He states " they had me blow, and blow, and blow" and then my headache started.   Daily medications include Symbicort. He also states that he was recently enrolled in an asthma trial 2 days ago. He denies methamphetamine use, alcohol, or other recreational or IV drug use.   The history is provided by the patient. No language interpreter was used.    Past Medical History:  Diagnosis Date  . Asthma     Patient Active Problem List   Diagnosis Date Noted  . LEG PAIN 04/26/2010  . DEPRESSION 08/19/2009  . RENAL CALCULUS 01/05/2009  . DM 10/12/2008  . COCAINE ABUSE, HX OF 06/16/2007  . ASTHMA 03/24/2007  .  GERD 03/24/2007    Past Surgical History:  Procedure Laterality Date  . HERNIA REPAIR         Home Medications    Prior to Admission medications   Medication Sig Start Date End Date Taking? Authorizing Provider  acetaminophen (TYLENOL) 500 MG tablet Take 1,000 mg by mouth daily as needed for mild pain or moderate pain.    Yes [provider]  budesonide-formoterol (SYMBICORT) 160-4.5 MCG/ACT inhaler Inhale 2 puffs into the lungs 2 (two) times daily. 12/10/16  Yes Law, Alexandra M, PA-C  COMBIVENT RESPIMAT 20-100 MCG/ACT AERS respimat Inhale 2 puffs into the lungs daily as needed for shortness of breath. Patient taking differently: Inhale 2 puffs into the lungs every 6 (six) hours as needed for shortness of breath.  12/10/16  Yes Law, Bea Graff, PA-C  VENTOLIN HFA 108 (90 Base) MCG/ACT inhaler Take 2 puffs by mouth every 4 (four) hours as needed for shortness of breath.  11/21/16  Yes [provider]  sildenafil (VIAGRA) 50 MG tablet Take 50 mg by mouth as needed for erectile dysfunction. 11/21/16   [provider]    Family History History reviewed. No pertinent family history.  Social History Social History  Substance Use Topics  . Smoking status: Never Smoker  . Smokeless tobacco: Never Used  . Alcohol use No     Allergies  Patient has no known allergies.   Review of Systems Review of Systems  Constitutional: Negative for fever.  Respiratory: Negative for shortness of breath.   Cardiovascular: Negative for chest pain.  Musculoskeletal: Positive for myalgias. Negative for back pain and neck pain.  Neurological: Positive for numbness and headaches (resolved). Negative for dizziness, seizures, syncope, facial asymmetry and weakness.     Physical Exam Updated Vital Signs BP (!) 143/61 (BP Location: Left Arm)   Pulse 81   Temp 98.4 F (36.9 C) (Oral)   Resp 18   SpO2 95%   Physical Exam  Constitutional: He appears well-developed and  well-nourished. No distress.  HENT:  Head: Normocephalic.  Eyes: Conjunctivae are normal.  Neck: Neck supple.  Cardiovascular: Normal rate, regular rhythm and normal heart sounds.  Exam reveals no gallop and no friction rub.   No murmur heard. Radial, DP, and PT pulses are 2+ and symmetric.  Pulmonary/Chest: Effort normal and breath sounds normal. No respiratory distress. He has no wheezes. He has no rales.  Abdominal: Soft. He exhibits no distension.  Musculoskeletal: Normal range of motion. He exhibits no edema, tenderness or deformity.  Neurological: He is alert.  5 out of 5 strength of the bilateral upper and lower extremities. 2. discrimination is intact throughout the bilateral lower extremities. Able to bear weight on the bilateral lower extremity. Symmetric tandem gait.  Skin: Skin is warm and dry. No rash noted. He is not diaphoretic. No erythema.  No puncture or bite marks noted to the left upper extremity. No erythema, edema or warmth. No ecchymosis.  Psychiatric: His behavior is normal.  Nursing note and vitals reviewed.    ED Treatments / Results  Labs (all labs ordered are listed, but only abnormal results are displayed) Labs Reviewed - No data to display  EKG  EKG Interpretation None       Radiology No results found.  Procedures Procedures (including critical care time)  Medications Ordered in ED Medications - No data to display   Initial Impression / Assessment and Plan / ED Course  I have reviewed the triage vital signs and the nursing notes.  Pertinent labs & imaging results that were available during my care of the patient were reviewed by me and considered in my medical decision making (see chart for details).     61 year old male with a history of asthma presenting with right leg numbness, tingling and his right toes, and pain and swelling to the left arm. He was observed in the ED over the last 5 hours. He states that his symptoms are improving  and have nearly resolved. No new complaints at this time. No focal deficits on neurologic exam. Symmetric tandem gait. The patient is concerned that he was bitten by a spider, no evidence of a puncture or bite mark to the left arm, where he states that he felt the "pinching" from the bite. At this time, I'm unclear of the etiology of the patient's symptoms. The patient was seen and evaluated with Dr. Verner Chol, attending physician. Discussed with patient that we can check his electrolytes since he was presenting diagnosed with prediabetes. The patient states he is feeling much better and has another appointment across town in the next 2 hours that he cannot miss so he declines blood work at this time. He is requesting a work note. Strict return precautions given. No acute distress. The patient is safe for discharge at this time.  Final Clinical Impressions(s) / ED Diagnoses   Final  diagnoses:  Right leg numbness  Left wrist pain    New Prescriptions Current Discharge Medication List       Joanne Gavel, PA-C 03/01/17 [place], [place], MD 03/01/17 [phone]

## 2017-03-01 NOTE — Discharge Instructions (Signed)
If your symptoms persist, it is possible it could be related to your new asthma medication, Spiriva. It has been known to cause leg and muscle pain in up to 4% of patients. If your symptoms persist, but do not worsen, please follow-up with your primary care provider.  If you develop new or worsening symptoms, including weakness, a severe headache, numbness in both legs, or a high fever, please return to the emergency department for re-evaluation.

## 2017-11-14 ENCOUNTER — Encounter (HOSPITAL_COMMUNITY): Payer: Self-pay

## 2017-11-14 ENCOUNTER — Emergency Department (HOSPITAL_COMMUNITY): Payer: Self-pay

## 2017-11-14 ENCOUNTER — Emergency Department (HOSPITAL_COMMUNITY)
Admission: EM | Admit: 2017-11-14 | Discharge: 2017-11-15 | Disposition: A | Discharge status: Home / Self Care | Payer: Self-pay | Attending: Emergency Medicine | Admitting: Emergency Medicine

## 2017-11-14 DIAGNOSIS — Z79899 Other long term (current) drug therapy: Secondary | ICD-10-CM | POA: Insufficient documentation

## 2017-11-14 DIAGNOSIS — J4541 Moderate persistent asthma with (acute) exacerbation: Secondary | ICD-10-CM | POA: Insufficient documentation

## 2017-11-14 LAB — BASIC METABOLIC PANEL
ANION GAP: 10 (ref 5–15)
BUN: 12 mg/dL (ref 6–20)
CALCIUM: 9.3 mg/dL (ref 8.9–10.3)
CO2: 24 mmol/L (ref 22–32)
CREATININE: 1.5 mg/dL — AB (ref 0.61–1.24)
Chloride: 109 mmol/L (ref 101–111)
GFR, EST AFRICAN AMERICAN: 57 mL/min — AB (ref >60)
GFR, EST NON AFRICAN AMERICAN: 49 mL/min — AB (ref >60)
Glucose, Bld: 189 mg/dL — ABNORMAL HIGH (ref 65–99)
Potassium: 4.9 mmol/L (ref 3.5–5.1)
SODIUM: 143 mmol/L (ref 135–145)

## 2017-11-14 LAB — CBC
HCT: 41.5 % (ref 39.0–52.0)
HEMOGLOBIN: 13.5 g/dL (ref 13.0–17.0)
MCH: 27.6 pg (ref 26.0–34.0)
MCHC: 32.5 g/dL (ref 30.0–36.0)
MCV: 84.9 fL (ref 78.0–100.0)
PLATELETS: 278 10*3/uL (ref 150–400)
RBC: 4.89 MIL/uL (ref 4.22–5.81)
RDW: 13.7 % (ref 11.5–15.5)
WBC: 5 10*3/uL (ref 4.0–10.5)

## 2017-11-14 LAB — TROPONIN I: TROPONIN I: 0.03 ng/mL — AB (ref <0.03)

## 2017-11-14 LAB — BRAIN NATRIURETIC PEPTIDE: B NATRIURETIC PEPTIDE 5: 65.1 pg/mL (ref 0.0–100.0)

## 2017-11-14 MED ORDER — ALBUTEROL SULFATE HFA 108 (90 BASE) MCG/ACT IN AERS
2.0000 | INHALATION_SPRAY | Freq: Once | RESPIRATORY_TRACT | Status: AC
  Administered 2017-11-14: 2 via RESPIRATORY_TRACT
  Filled 2017-11-14: qty 6.7

## 2017-11-14 MED ORDER — PREDNISONE 20 MG PO TABS
40.0000 mg | ORAL_TABLET | Freq: Once | ORAL | Status: AC
  Administered 2017-11-14: 40 mg via ORAL
  Filled 2017-11-14: qty 2

## 2017-11-14 MED ORDER — PREDNISONE 20 MG PO TABS: 40.0000 mg | ORAL_TABLET | Freq: Every day | ORAL | 0 refills | Status: DC

## 2017-11-14 MED ORDER — ALBUTEROL SULFATE HFA 108 (90 BASE) MCG/ACT IN AERS
2.0000 | INHALATION_SPRAY | Freq: Once | RESPIRATORY_TRACT | Status: AC
  Administered 2017-11-14: 2 via RESPIRATORY_TRACT

## 2017-11-14 MED ORDER — ALBUTEROL SULFATE (2.5 MG/3ML) 0.083% IN NEBU: 5.0000 mg | INHALATION_SOLUTION | Freq: Once | RESPIRATORY_TRACT | Status: DC

## 2017-11-14 MED ORDER — ALBUTEROL SULFATE (2.5 MG/3ML) 0.083% IN NEBU: 2.5000 mg | INHALATION_SOLUTION | Freq: Four times a day (QID) | RESPIRATORY_TRACT | 12 refills | Status: DC | PRN

## 2017-11-14 NOTE — ED Triage Notes (Signed)
Pt complains of being short of breath, hx of asthma, no relief with breathing treatments or inhaler Pts primary doctor sent him here for a chest xray to rule out CHF

## 2017-11-14 NOTE — Discharge Instructions (Signed)
Please follow-up with your doctor in 2 or 3 days for a recheck.  Your testing tonight shows that the reason you are having shortness of breath is likely from asthma and not from heart failure or a heart attack.  He will likely need to follow-up with a cardiologist as an outpatient to have a stress test.  Please make a phone call in the morning to arrange close follow-up within the next week.  Emergency department for severe or worsening symptoms  Prednisone 40 mg by mouth daily  Albuterol every 4 hours as needed for shortness of breath.

## 2017-11-14 NOTE — ED Provider Notes (Signed)
[facility] DEPT Provider Note   CSN: [id number] Arrival date & time: 11/14/17  2007     History   Chief Complaint Chief Complaint  Patient presents with  . Shortness of Breath    HPI Charles Riggs is a 62 y.o. male.  HPI  The patient is a 62 year old male, he has a known history of asthma, also a history of cocaine abuse.  He has had shortness of breath for over 1 week, he reports that the shortness of breath is not seemingly to get better despite taking albuterol though he does state he has some transient improvement.  He was seen at his doctor's office today who recommended that he come to the hospital for evaluation of congestive heart failure.  He states he does have some ankle swelling near the end of the evening, sometimes he has some dyspnea on exertion but when he gets his "second wind" he is able to keep going without any difficulty.  No history of cardiac disease, he has never had that work-up.  He denies tobacco or alcohol use.  He is out of his albuterol inhaler and requesting a second 1.  Past Medical History:  Diagnosis Date  . Asthma     Patient Active Problem List   Diagnosis Date Noted  . LEG PAIN 04/26/2010  . DEPRESSION 08/19/2009  . RENAL CALCULUS 01/05/2009  . DM 10/12/2008  . COCAINE ABUSE, HX OF 06/16/2007  . ASTHMA 03/24/2007  . GERD 03/24/2007    Past Surgical History:  Procedure Laterality Date  . HERNIA REPAIR          Home Medications    Prior to Admission medications   Medication Sig Start Date End Date Taking? Authorizing Provider  COMBIVENT RESPIMAT 20-100 MCG/ACT AERS respimat Inhale 2 puffs into the lungs daily as needed for shortness of breath. Patient taking differently: Inhale 2 puffs into the lungs every 6 (six) hours as needed for shortness of breath.  12/10/16  Yes Law, Bea Graff, PA-C  guaiFENesin (MUCINEX) 600 MG 12 hr tablet Take 600 mg by mouth 2 (two) times daily as needed for cough.    Yes [provider]  acetaminophen (TYLENOL) 500 MG tablet Take 1,000 mg by mouth daily as needed for mild pain or moderate pain.     [provider]  albuterol (PROVENTIL) (2.5 MG/3ML) 0.083% nebulizer solution Take 3 mLs (2.5 mg total) by nebulization every 6 (six) hours as needed for wheezing or shortness of breath. 11/14/17   Noemi Chapel, MD  budesonide-formoterol [facility]) 160-4.5 MCG/ACT inhaler Inhale 2 puffs into the lungs 2 (two) times daily. Patient not taking: Reported on 11/14/2017 12/10/16   Frederica Kuster, PA-C  predniSONE (DELTASONE) 20 MG tablet Take 2 tablets (40 mg total) by mouth daily. 11/14/17   Noemi Chapel, MD  sildenafil (VIAGRA) 50 MG tablet Take 50 mg by mouth as needed for erectile dysfunction. 11/21/16   [provider]    Family History History reviewed. No pertinent family history.  Social History Social History   Tobacco Use  . Smoking status: Never Smoker  . Smokeless tobacco: Never Used  Substance Use Topics  . Alcohol use: No  . Drug use: No     Allergies   Patient has no known allergies.   Review of Systems Review of Systems  All other systems reviewed and are negative.    Physical Exam Updated Vital Signs BP (!) 119/48 (BP Location: Right Arm)   Pulse  98   Temp 97.8 F (36.6 C) (Oral)   Resp (!) 21   Ht 5\' 11"  (1.803 m)   Wt 117.9 kg (260 lb)   SpO2 96%   BMI 36.26 kg/m   Physical Exam  Constitutional: He appears well-developed and well-nourished. No distress.  HENT:  Head: Normocephalic and atraumatic.  Mouth/Throat: Oropharynx is clear and moist. No oropharyngeal exudate.  Eyes: Pupils are equal, round, and reactive to light. Conjunctivae and EOM are normal. Right eye exhibits no discharge. Left eye exhibits no discharge. No scleral icterus.  Neck: Normal range of motion. Neck supple. No JVD present. No thyromegaly present.  Cardiovascular: Normal rate, regular rhythm, normal heart sounds and  intact distal pulses. Exam reveals no gallop and no friction rub.  No murmur heard. Pulmonary/Chest: Effort normal. No respiratory distress. He has wheezes. He has no rales.  Abdominal: Soft. Bowel sounds are normal. He exhibits no distension and no mass. There is no tenderness.  Musculoskeletal: Normal range of motion. He exhibits no edema or tenderness.  Lymphadenopathy:    He has no cervical adenopathy.  Neurological: He is alert. Coordination normal.  Skin: Skin is warm and dry. No rash noted. No erythema.  Psychiatric: He has a normal mood and affect. His behavior is normal.  Nursing note and vitals reviewed.    ED Treatments / Results  Labs (all labs ordered are listed, but only abnormal results are displayed) Labs Reviewed  TROPONIN I - Abnormal; Notable for the following components:      Result Value   Troponin I 0.03 (*)    All other components within normal limits  BASIC METABOLIC PANEL - Abnormal; Notable for the following components:   Glucose, Bld 189 (*)    Creatinine, Ser 1.50 (*)    GFR calc non Af Amer 49 (*)    GFR calc Af Amer 57 (*)    All other components within normal limits  CBC  BRAIN NATRIURETIC PEPTIDE  TROPONIN I    EKG EKG Interpretation  Date/Time:  Thursday November 14 2017 20:28:12 EDT Ventricular Rate:  86 PR Interval:    QRS Duration: 115 QT Interval:  391 QTC Calculation: 468 R Axis:   15 Text Interpretation:  Sinus rhythm Incomplete left bundle branch block Probable left ventricular hypertrophy Anterior Q waves, possibly due to LVH since last tracing no significant change Confirmed by Daleen Bo [phone]) on 11/14/2017 8:53:29 PM   Radiology Dg Chest 2 View  Result Date: 11/14/2017 CLINICAL DATA:  Initial evaluation for acute shortness of breath. EXAM: CHEST - 2 VIEW COMPARISON:  Prior radiograph from 12/10/2016 FINDINGS: The cardiac and mediastinal silhouettes are stable in size and contour, and remain within normal limits. The lungs  are normally inflated. No airspace consolidation, pleural effusion, or pulmonary edema is identified. There is no pneumothorax. No acute osseous abnormality identified. IMPRESSION: No radiographic evidence for active cardiopulmonary disease. Electronically Signed   By: Jeannine Boga M.D.   On: 11/14/2017 20:54    Procedures Procedures (including critical care time)  Medications Ordered in ED Medications  albuterol (PROVENTIL HFA;VENTOLIN HFA) 108 (90 Base) MCG/ACT inhaler 2 puff (has no administration in time range)  albuterol (PROVENTIL HFA;VENTOLIN HFA) 108 (90 Base) MCG/ACT inhaler 2 puff (2 puffs Inhalation Given 11/14/17 2123)  predniSONE (DELTASONE) tablet 40 mg (40 mg Oral Given 11/14/17 2121)     Initial Impression / Assessment and Plan / ED Course  I have reviewed the triage vital signs and the nursing notes.  Pertinent labs & imaging results that were available during my care of the patient were reviewed by me and considered in my medical decision making (see chart for details).    The patient is otherwise well-appearing, speaking in full sentences without any increased work of breathing.  He does have expiratory wheezing which is mild, he has no JVD or signs of peripheral edema.  Will complete his CHF work-up with BNP, labs and a EKG as well as a chest x-ray.  Chest x-ray performed and negative for any acute findings including cardiomegaly or pulmonary edema  Albuterol inhaler ordered, prednisone ordered  Chest x-ray negative, creatinine is elevated at 1.5, this is consistent with prior labs, troponin is 0.03, this will need a repeat.  His BNP is normal suggesting that this is not congestive heart failure.  Care will be signed out to Dr. Tyrone Nine pending follow-up of second troponin, anticipate discharge if no significant increase  Final Clinical Impressions(s) / ED Diagnoses   Final diagnoses:  Moderate persistent asthma with exacerbation    ED Discharge Orders         Ordered    predniSONE (DELTASONE) 20 MG tablet  Daily     11/14/17 2339    albuterol (PROVENTIL) (2.5 MG/3ML) 0.083% nebulizer solution  Every 6 hours PRN     11/14/17 2341       Noemi Chapel, MD 11/14/17 2352

## 2017-11-15 LAB — TROPONIN I: Troponin I: <0.03 ng/mL (ref <0.03)

## 2017-12-12 ENCOUNTER — Encounter (HOSPITAL_COMMUNITY): Payer: Self-pay

## 2017-12-12 ENCOUNTER — Other Ambulatory Visit: Payer: Self-pay

## 2017-12-12 DIAGNOSIS — J4541 Moderate persistent asthma with (acute) exacerbation: Secondary | ICD-10-CM | POA: Insufficient documentation

## 2017-12-12 DIAGNOSIS — Z79899 Other long term (current) drug therapy: Secondary | ICD-10-CM | POA: Insufficient documentation

## 2017-12-12 MED ORDER — IPRATROPIUM-ALBUTEROL 0.5-2.5 (3) MG/3ML IN SOLN
3.0000 mL | Freq: Once | RESPIRATORY_TRACT | Status: AC
  Administered 2017-12-12: 3 mL via RESPIRATORY_TRACT
  Filled 2017-12-12: qty 3

## 2017-12-12 NOTE — ED Triage Notes (Signed)
PT presents to ED from home for SOB. PT reports that he has been having asthma exacerbations for weeks now. Pt reports that he is running out of his inhalers. Pt also reports that this morning the tip of a cotton Q-tip got stuck in his L ear.

## 2017-12-13 ENCOUNTER — Emergency Department (HOSPITAL_COMMUNITY)
Admission: EM | Admit: 2017-12-13 | Discharge: 2017-12-13 | Disposition: A | Discharge status: Home / Self Care | Payer: Medicaid Other | Attending: Emergency Medicine | Admitting: Emergency Medicine

## 2017-12-13 DIAGNOSIS — J4541 Moderate persistent asthma with (acute) exacerbation: Secondary | ICD-10-CM

## 2017-12-13 MED ORDER — ALBUTEROL SULFATE HFA 108 (90 BASE) MCG/ACT IN AERS
2.0000 | INHALATION_SPRAY | Freq: Once | RESPIRATORY_TRACT | Status: AC
  Administered 2017-12-13: 2 via RESPIRATORY_TRACT
  Filled 2017-12-13: qty 6.7

## 2017-12-13 MED ORDER — IPRATROPIUM BROMIDE 0.02 % IN SOLN
0.5000 mg | Freq: Once | RESPIRATORY_TRACT | Status: AC
  Administered 2017-12-13: 0.5 mg via RESPIRATORY_TRACT
  Filled 2017-12-13: qty 2.5

## 2017-12-13 MED ORDER — DEXAMETHASONE SODIUM PHOSPHATE 10 MG/ML IJ SOLN
10.0000 mg | Freq: Once | INTRAMUSCULAR | Status: AC
  Administered 2017-12-13: 10 mg via INTRAMUSCULAR
  Filled 2017-12-13: qty 1

## 2017-12-13 MED ORDER — ALBUTEROL SULFATE (2.5 MG/3ML) 0.083% IN NEBU
5.0000 mg | INHALATION_SOLUTION | Freq: Once | RESPIRATORY_TRACT | Status: AC
  Administered 2017-12-13: 5 mg via RESPIRATORY_TRACT
  Filled 2017-12-13: qty 6

## 2017-12-13 NOTE — ED Provider Notes (Signed)
[facility] DEPT Provider Note   CSN: [id number] Arrival date & time: 12/12/17  2340     History   Chief Complaint Chief Complaint  Patient presents with  . Shortness of Breath  . Foreign Body in [place] is a 62 y.o. male.  62 year old male with a history of asthma presents to the emergency department for chest tightness and shortness of breath.  He states that symptoms have been worsening over the past few weeks.  He has been using his home inhaler for management, but ran out of this medication recently.  He feels his asthma has become aggravated by the weather changes and dryness.  He has had some congestion in his nose, but denies other cold symptoms.  No associated fevers, hemoptysis, leg swelling, chest pain.  He also expresses concern about foreign body in his left ear.  States that he was using a Q-tip and feels that some of the cotton may be stuck.     Past Medical History:  Diagnosis Date  . Asthma     Patient Active Problem List   Diagnosis Date Noted  . LEG PAIN 04/26/2010  . DEPRESSION 08/19/2009  . RENAL CALCULUS 01/05/2009  . DM 10/12/2008  . COCAINE ABUSE, HX OF 06/16/2007  . ASTHMA 03/24/2007  . GERD 03/24/2007    Past Surgical History:  Procedure Laterality Date  . HERNIA REPAIR          Home Medications    Prior to Admission medications   Medication Sig Start Date End Date Taking? Authorizing Provider  acetaminophen (TYLENOL) 500 MG tablet Take 1,000 mg by mouth daily as needed for mild pain or moderate pain.     [provider]  albuterol (PROVENTIL) (2.5 MG/3ML) 0.083% nebulizer solution Take 3 mLs (2.5 mg total) by nebulization every 6 (six) hours as needed for wheezing or shortness of breath. 11/14/17   Noemi Chapel, MD  budesonide-formoterol [facility]) 160-4.5 MCG/ACT inhaler Inhale 2 puffs into the lungs 2 (two) times daily. Patient not taking: Reported on 11/14/2017 12/10/16   Frederica Kuster, PA-C  COMBIVENT RESPIMAT 20-100 MCG/ACT AERS respimat Inhale 2 puffs into the lungs daily as needed for shortness of breath. Patient taking differently: Inhale 2 puffs into the lungs every 6 (six) hours as needed for shortness of breath.  12/10/16   Law, Bea Graff, PA-C  guaiFENesin (MUCINEX) 600 MG 12 hr tablet Take 600 mg by mouth 2 (two) times daily as needed for cough.    [provider]  predniSONE (DELTASONE) 20 MG tablet Take 2 tablets (40 mg total) by mouth daily. 11/14/17   Noemi Chapel, MD  sildenafil (VIAGRA) 50 MG tablet Take 50 mg by mouth as needed for erectile dysfunction. 11/21/16   [provider]    Family History History reviewed. No pertinent family history.  Social History Social History   Tobacco Use  . Smoking status: Never Smoker  . Smokeless tobacco: Never Used  Substance Use Topics  . Alcohol use: No  . Drug use: No     Allergies   Patient has no known allergies.   Review of Systems Review of Systems Ten systems reviewed and are negative for acute change, except as noted in the HPI.    Physical Exam Updated Vital Signs BP (!) 155/61 (BP Location: Right Arm)   Pulse 71   Temp 97.9 F (36.6 C) (Oral)   Resp 18   SpO2 98%  Physical Exam  Constitutional: He is oriented to person, place, and time. He appears well-developed and well-nourished. No distress.  Nontoxic appearing, pleasant.  HENT:  Head: Normocephalic and atraumatic.  Clean bilateral TMs. There is a purulent effusion behind the left TM with bulging.  Eyes: Conjunctivae and EOM are normal. No scleral icterus.  Neck: Normal range of motion.  Cardiovascular: Normal rate, regular rhythm and intact distal pulses.  Pulmonary/Chest: Effort normal. No respiratory distress. He has wheezes. He has no rales.  Inspiratory and expiratory wheezing  Musculoskeletal: Normal range of motion.  Neurological: He is alert and oriented to person, place, and time. He  exhibits normal muscle tone. Coordination normal.  Ambulatory with steady gait.  Skin: Skin is warm and dry. No rash noted. He is not diaphoretic. No erythema. No pallor.  Psychiatric: He has a normal mood and affect. His behavior is normal.  Nursing note and vitals reviewed.    ED Treatments / Results  Labs (all labs ordered are listed, but only abnormal results are displayed) Labs Reviewed - No data to display  EKG None  Radiology No results found.  Procedures Procedures (including critical care time)  Medications Ordered in ED Medications  ipratropium-albuterol (DUONEB) 0.5-2.5 (3) MG/3ML nebulizer solution 3 mL (3 mLs Nebulization Given 12/12/17 2357)  dexamethasone (DECADRON) injection 10 mg (10 mg Intramuscular Given 12/13/17 0038)  albuterol (PROVENTIL HFA;VENTOLIN HFA) 108 (90 Base) MCG/ACT inhaler 2 puff (2 puffs Inhalation Given 12/13/17 0039)  albuterol (PROVENTIL) (2.5 MG/3ML) 0.083% nebulizer solution 5 mg (5 mg Nebulization Given 12/13/17 0044)  ipratropium (ATROVENT) nebulizer solution 0.5 mg (0.5 mg Nebulization Given 12/13/17 0044)    1:20 AM Patient reassessed.  Lung sounds improved.  Patient states that he is feeling better.  No hypoxia noted.   Initial Impression / Assessment and Plan / ED Course  I have reviewed the triage vital signs and the nursing notes.  Pertinent labs & imaging results that were available during my care of the patient were reviewed by me and considered in my medical decision making (see chart for details).     62 year old male presenting for asthma exacerbation.  This has been managed with DuoNeb x2 in the emergency department.  He was given Decadron for ongoing symptomatic management.  Have discussed the use of outpatient albuterol inhaler every 6 hours as needed.  Patient also expresses concern for foreign body in the left ear canal.  Exam reveals no presence of foreign body.  Patient provided reassurance.  I do not believe further  emergent work-up is indicated at this time.  Return precautions discussed and provided. Patient discharged in stable condition with no unaddressed concerns.   Final Clinical Impressions(s) / ED Diagnoses   Final diagnoses:  Moderate persistent asthma with exacerbation    ED Discharge Orders    None       Antonietta Breach, PA-C 12/13/17 0235    Palumbo, April, MD 12/13/17 [phone]

## 2018-03-15 ENCOUNTER — Other Ambulatory Visit: Payer: Self-pay

## 2018-03-15 ENCOUNTER — Encounter (HOSPITAL_COMMUNITY): Payer: Self-pay | Admitting: *Deleted

## 2018-03-15 ENCOUNTER — Emergency Department (HOSPITAL_COMMUNITY)
Admission: EM | Admit: 2018-03-15 | Discharge: 2018-03-15 | Disposition: A | Discharge status: Home / Self Care | Payer: Self-pay | Attending: Emergency Medicine | Admitting: Emergency Medicine

## 2018-03-15 ENCOUNTER — Emergency Department (HOSPITAL_COMMUNITY): Payer: Self-pay

## 2018-03-15 DIAGNOSIS — M5431 Sciatica, right side: Secondary | ICD-10-CM | POA: Insufficient documentation

## 2018-03-15 DIAGNOSIS — J441 Chronic obstructive pulmonary disease with (acute) exacerbation: Secondary | ICD-10-CM | POA: Insufficient documentation

## 2018-03-15 MED ORDER — PREDNISONE 20 MG PO TABS: ORAL_TABLET | ORAL | 0 refills | Status: DC

## 2018-03-15 MED ORDER — IPRATROPIUM-ALBUTEROL 0.5-2.5 (3) MG/3ML IN SOLN
3.0000 mL | RESPIRATORY_TRACT | Status: AC
  Administered 2018-03-15: 3 mL via RESPIRATORY_TRACT
  Filled 2018-03-15: qty 6
  Filled 2018-03-15: qty 3

## 2018-03-15 MED ORDER — MELOXICAM 7.5 MG PO TABS: 7.5000 mg | ORAL_TABLET | Freq: Every day | ORAL | 0 refills | Status: DC

## 2018-03-15 MED ORDER — DIAZEPAM 5 MG PO TABS
5.0000 mg | ORAL_TABLET | Freq: Once | ORAL | Status: AC
  Administered 2018-03-15: 5 mg via ORAL
  Filled 2018-03-15: qty 1

## 2018-03-15 MED ORDER — OXYCODONE HCL 5 MG PO TABS
5.0000 mg | ORAL_TABLET | Freq: Once | ORAL | Status: AC
  Administered 2018-03-15: 5 mg via ORAL
  Filled 2018-03-15: qty 1

## 2018-03-15 MED ORDER — PREDNISONE 20 MG PO TABS
60.0000 mg | ORAL_TABLET | Freq: Once | ORAL | Status: AC
  Administered 2018-03-15: 60 mg via ORAL
  Filled 2018-03-15: qty 3

## 2018-03-15 MED ORDER — ALBUTEROL SULFATE (2.5 MG/3ML) 0.083% IN NEBU: 5.0000 mg | INHALATION_SOLUTION | Freq: Once | RESPIRATORY_TRACT | Status: DC

## 2018-03-15 NOTE — ED Provider Notes (Addendum)
[facility] DEPT Provider Note   CSN: [id number] Arrival date & time: 03/15/18  1813     History   Chief Complaint Chief Complaint  Patient presents with  . Shortness of Breath  . Leg Pain    HPI Charles Riggs is a 62 y.o. male.  62 yo M with a chief complaint of shortness of breath and right-sided leg pain.  Shortness of breath is gone for the past few days.  Feels like his prior COPD.  Typically he gets an injection of Solu-Medrol and some other medication that covers him for about a week.  He was unable to get that today and so came to the emergency department.  The right-sided low back pain is been going on for the past couple weeks.  Seems to come and go.  Has gotten a little bit worse over the past few days.  He denies trauma denies fevers.  Denies abdominal pain.  Denies injection into the back.  Denies loss of bowel or bladder.  The history is provided by the patient.  Shortness of Breath  This is a new problem. The average episode lasts 2 days. The current episode started 2 days ago. The problem has not changed since onset.Associated symptoms include cough and leg pain. Pertinent negatives include no fever, no headaches, no chest pain, no vomiting, no abdominal pain and no rash. He has tried nothing for the symptoms. The treatment provided no relief. He has had no prior hospitalizations. He has had no prior ED visits. He has had no prior ICU admissions.  Leg Pain      Past Medical History:  Diagnosis Date  . Asthma     Patient Active Problem List   Diagnosis Date Noted  . LEG PAIN 04/26/2010  . DEPRESSION 08/19/2009  . RENAL CALCULUS 01/05/2009  . DM 10/12/2008  . COCAINE ABUSE, HX OF 06/16/2007  . ASTHMA 03/24/2007  . GERD 03/24/2007    Past Surgical History:  Procedure Laterality Date  . HERNIA REPAIR          Home Medications    Prior to Admission medications   Medication Sig Start Date End Date Taking? Authorizing  Provider  acetaminophen (TYLENOL) 500 MG tablet Take 1,000 mg by mouth daily as needed for mild pain or moderate pain.     [provider]  albuterol (PROVENTIL) (2.5 MG/3ML) 0.083% nebulizer solution Take 3 mLs (2.5 mg total) by nebulization every 6 (six) hours as needed for wheezing or shortness of breath. 11/14/17   Noemi Chapel, MD  budesonide-formoterol [facility]) 160-4.5 MCG/ACT inhaler Inhale 2 puffs into the lungs 2 (two) times daily. Patient not taking: Reported on 11/14/2017 12/10/16   Frederica Kuster, PA-C  COMBIVENT RESPIMAT 20-100 MCG/ACT AERS respimat Inhale 2 puffs into the lungs daily as needed for shortness of breath. Patient taking differently: Inhale 2 puffs into the lungs every 6 (six) hours as needed for shortness of breath.  12/10/16   Law, Bea Graff, PA-C  guaiFENesin (MUCINEX) 600 MG 12 hr tablet Take 600 mg by mouth 2 (two) times daily as needed for cough.    [provider]  meloxicam (MOBIC) 7.5 MG tablet Take 1 tablet (7.5 mg total) by mouth daily. 03/15/18   Deno Etienne, DO  predniSONE (DELTASONE) 20 MG tablet 2 tabs po daily x 4 days 03/15/18   Deno Etienne, DO  sildenafil (VIAGRA) 50 MG tablet Take 50 mg by mouth as needed for erectile dysfunction. 11/21/16  [provider]    Family History No family history on file.  Social History Social History   Tobacco Use  . Smoking status: Never Smoker  . Smokeless tobacco: Never Used  Substance Use Topics  . Alcohol use: No  . Drug use: No     Allergies   Patient has no known allergies.   Review of Systems Review of Systems  Constitutional: Negative for chills and fever.  HENT: Negative for congestion and facial swelling.   Eyes: Negative for discharge and visual disturbance.  Respiratory: Positive for cough and shortness of breath.   Cardiovascular: Negative for chest pain and palpitations.  Gastrointestinal: Negative for abdominal pain, diarrhea and vomiting.  Musculoskeletal:  Positive for back pain. Negative for arthralgias and myalgias.  Skin: Negative for color change and rash.  Neurological: Negative for tremors, syncope and headaches.  Psychiatric/Behavioral: Negative for confusion and dysphoric mood.     Physical Exam Updated Vital Signs BP (!) 168/77 (BP Location: Right Arm)   Pulse 87   Temp 98.3 F (36.8 C) (Oral)   Resp (!) 24   SpO2 97%   Physical Exam  Constitutional: He is oriented to person, place, and time. He appears well-developed and well-nourished.  HENT:  Head: Normocephalic and atraumatic.  Eyes: Pupils are equal, round, and reactive to light. EOM are normal.  Neck: Normal range of motion. Neck supple. No JVD present.  Cardiovascular: Normal rate and regular rhythm. Exam reveals no gallop and no friction rub.  No murmur heard. Pulmonary/Chest: No respiratory distress. He has decreased breath sounds (mildly diminished in all fields). He has no wheezes.  Abdominal: He exhibits no distension. There is no rebound and no guarding.  Musculoskeletal: Normal range of motion.  Mild tenderness to the R SI joint.  PMS intact distally, negative SLR test.  No clonus, reflexes normal.   Neurological: He is alert and oriented to person, place, and time.  Skin: No rash noted. No pallor.  Psychiatric: He has a normal mood and affect. His behavior is normal.  Nursing note and vitals reviewed.    ED Treatments / Results  Labs (all labs ordered are listed, but only abnormal results are displayed) Labs Reviewed - No data to display  EKG EKG Interpretation  Date/Time:  Saturday March 15 2018 18:19:16 EDT Ventricular Rate:  84 PR Interval:    QRS Duration: 116 QT Interval:  407 QTC Calculation: 482 R Axis:   33 Text Interpretation:  Sinus rhythm Probable left atrial enlargement LVH with secondary repolarization abnormality Anterior infarct, old No significant change since last tracing Confirmed by Deno Etienne [phone]) on 03/15/2018 6:57:22  PM   Radiology Dg Chest 2 View  Result Date: 03/15/2018 CLINICAL DATA:  Shortness of breath. EXAM: CHEST - 2 VIEW COMPARISON:  Radiographs of November 14, 2017. FINDINGS: The heart size and mediastinal contours are within normal limits. Both lungs are clear. No pneumothorax or pleural effusion is noted. The visualized skeletal structures are unremarkable. IMPRESSION: No active cardiopulmonary disease. Electronically Signed   By: Marijo Conception, M.D.   On: 03/15/2018 18:53    Procedures Procedures (including critical care time)  Medications Ordered in ED Medications  ipratropium-albuterol (DUONEB) 0.5-2.5 (3) MG/3ML nebulizer solution 3 mL (3 mLs Nebulization Given 03/15/18 1927)  predniSONE (DELTASONE) tablet 60 mg (60 mg Oral Given 03/15/18 1926)  oxyCODONE (Oxy IR/ROXICODONE) immediate release tablet 5 mg (5 mg Oral Given 03/15/18 1925)  diazepam (VALIUM) tablet 5 mg (5 mg Oral Given 03/15/18  1925)     Initial Impression / Assessment and Plan / ED Course  I have reviewed the triage vital signs and the nursing notes.  Pertinent labs & imaging results that were available during my care of the patient were reviewed by me and considered in my medical decision making (see chart for details).     62 yo M with a chief complaint of shortness of breath and right-sided low back pain.  Patient with no significant wheezes on my exam.  Is not hypoxic.  No tachypnea.  Will give another breathing treatment here.  Will give burst dose of steroids.  He took somebody else's meloxicam and felt that that improved his symptoms better than ibuprofen.  Is requesting a prescription.  Denies issue with his kidneys.   8:16 PM:  I have discussed the diagnosis/risks/treatment options with the patient and believe the pt to be eligible for discharge home to follow-up with PCP. We also discussed returning to the ED immediately if new or worsening sx occur. We discussed the sx which are most concerning (e.g., sudden  worsening pain, fever, inability to tolerate by mouth) that necessitate immediate return. Medications administered to the patient during their visit and any new prescriptions provided to the patient are listed below.  Medications given during this visit Medications  ipratropium-albuterol (DUONEB) 0.5-2.5 (3) MG/3ML nebulizer solution 3 mL (3 mLs Nebulization Given 03/15/18 1927)  predniSONE (DELTASONE) tablet 60 mg (60 mg Oral Given 03/15/18 1926)  oxyCODONE (Oxy IR/ROXICODONE) immediate release tablet 5 mg (5 mg Oral Given 03/15/18 1925)  diazepam (VALIUM) tablet 5 mg (5 mg Oral Given 03/15/18 1925)     The patient appears reasonably screen and/or stabilized for discharge and I doubt any other medical condition or other [facility] requiring further screening, evaluation, or treatment in the ED at this time prior to discharge.    Final Clinical Impressions(s) / ED Diagnoses   Final diagnoses:  Sciatica of right side  COPD exacerbation [facility])    ED Discharge Orders         Ordered    predniSONE (DELTASONE) 20 MG tablet     03/15/18 2011    meloxicam (MOBIC) 7.5 MG tablet  Daily     03/15/18 2015           Deno Etienne, DO 03/15/18 2015    Deno Etienne, DO 03/15/18 2016

## 2018-03-15 NOTE — ED Triage Notes (Signed)
Pt complains of shortness of breath. Pt states he was supposed to get a shot of solumedrol at his PCP but was unable to. Pt also complains of right hip pain radiating to his right foot. Pt took a friend's muscle relaxer, which he states provided relief.

## 2018-03-31 ENCOUNTER — Encounter (HOSPITAL_COMMUNITY): Payer: Self-pay | Admitting: Emergency Medicine

## 2018-03-31 ENCOUNTER — Emergency Department (HOSPITAL_COMMUNITY): Payer: Self-pay

## 2018-03-31 ENCOUNTER — Other Ambulatory Visit: Payer: Self-pay

## 2018-03-31 ENCOUNTER — Emergency Department (HOSPITAL_COMMUNITY)
Admission: EM | Admit: 2018-03-31 | Discharge: 2018-03-31 | Disposition: A | Discharge status: Home / Self Care | Payer: Self-pay | Attending: Emergency Medicine | Admitting: Emergency Medicine

## 2018-03-31 DIAGNOSIS — M5431 Sciatica, right side: Secondary | ICD-10-CM | POA: Insufficient documentation

## 2018-03-31 DIAGNOSIS — J45909 Unspecified asthma, uncomplicated: Secondary | ICD-10-CM | POA: Insufficient documentation

## 2018-03-31 NOTE — ED Provider Notes (Signed)
[facility] DEPT Provider Note   CSN: [id number] Arrival date & time: 03/31/18  1623     History   Chief Complaint Chief Complaint  Patient presents with  . Leg Pain  . foot tingling    HPI Charles Riggs is a 62 y.o. male.  HPI   Patient is a 62 year old male with a history of asthma, who presents the emergency department today for evaluation of pain to the right buttock and right leg that has been present for the last several months.  Pain radiates from the right buttock down to the right lower leg.  He has associated tingling to the right foot for several weeks.  He denies any recent injuries or falls.  Pain is worse after he stands for long periods of time.  Pain is improved after he works out in the morning and stretches his legs.  Denies weakness to the legs.  No loss control of bowel or bladder function.  No fevers, chills, history of cancer or IV drug use.  No saddle anesthesia or urinary retention.  Patient states that he was evaluated by his PCP earlier today and was told that he had flatfeet which could be contributing to his symptoms.    He states he was sent to the ED with imaging studies.  He has a paper at bedside from [facility] that indicates orders for bilateral feet x-rays, x-ray of the lumbar spine and x-ray of the pelvis.  He states that his PCP called in prescriptions for him.  Reviewed records.  Patient was recently evaluated for similar complaints and was diagnosed with sciatica on 03/15/18.  Past Medical History:  Diagnosis Date  . Asthma     Patient Active Problem List   Diagnosis Date Noted  . LEG PAIN 04/26/2010  . DEPRESSION 08/19/2009  . RENAL CALCULUS 01/05/2009  . DM 10/12/2008  . COCAINE ABUSE, HX OF 06/16/2007  . ASTHMA 03/24/2007  . GERD 03/24/2007    Past Surgical History:  Procedure Laterality Date  . HERNIA REPAIR          Home Medications    Prior to Admission medications     Medication Sig Start Date End Date Taking? Authorizing Provider  acetaminophen (TYLENOL) 500 MG tablet Take 1,000 mg by mouth daily as needed for mild pain or moderate pain.     [provider]  albuterol (PROVENTIL) (2.5 MG/3ML) 0.083% nebulizer solution Take 3 mLs (2.5 mg total) by nebulization every 6 (six) hours as needed for wheezing or shortness of breath. 11/14/17   Noemi Chapel, MD  budesonide-formoterol [facility]) 160-4.5 MCG/ACT inhaler Inhale 2 puffs into the lungs 2 (two) times daily. Patient not taking: Reported on 11/14/2017 12/10/16   Frederica Kuster, PA-C  COMBIVENT RESPIMAT 20-100 MCG/ACT AERS respimat Inhale 2 puffs into the lungs daily as needed for shortness of breath. Patient taking differently: Inhale 2 puffs into the lungs every 6 (six) hours as needed for shortness of breath.  12/10/16   Law, Bea Graff, PA-C  guaiFENesin (MUCINEX) 600 MG 12 hr tablet Take 600 mg by mouth 2 (two) times daily as needed for cough.    [provider]  meloxicam (MOBIC) 7.5 MG tablet Take 1 tablet (7.5 mg total) by mouth daily. 03/15/18   Deno Etienne, DO  predniSONE (DELTASONE) 20 MG tablet 2 tabs po daily x 4 days 03/15/18   Deno Etienne, DO  sildenafil (VIAGRA) 50 MG tablet Take 50 mg by  mouth as needed for erectile dysfunction. 11/21/16   [provider]    Family History No family history on file.  Social History Social History   Tobacco Use  . Smoking status: Never Smoker  . Smokeless tobacco: Never Used  Substance Use Topics  . Alcohol use: No  . Drug use: No     Allergies   Patient has no known allergies.   Review of Systems Review of Systems  Constitutional: Negative for chills and fever.  Eyes: Negative for visual disturbance.  Respiratory: Negative for shortness of breath.   Cardiovascular: Negative for chest pain.  Gastrointestinal: Negative for abdominal pain, nausea and vomiting.  Genitourinary: Negative for dysuria, flank pain,  frequency and urgency.  Musculoskeletal: Negative for back pain and neck pain.       Right hip and rle pain  Skin: Negative for rash.  Neurological: Positive for numbness. Negative for weakness.     Physical Exam Updated Vital Signs BP 127/62 (BP Location: Right Arm)   Pulse 74   Temp 98.8 F (37.1 C) (Oral)   Resp 14   Ht 5\' 11"  (1.803 m)   Wt 119.7 kg   SpO2 96%   BMI 36.82 kg/m   Physical Exam  Constitutional: He appears well-developed and well-nourished.  HENT:  Head: Normocephalic and atraumatic.  Eyes: Conjunctivae are normal.  Neck: Neck supple.  Cardiovascular: Normal rate and regular rhythm.  No murmur heard. Pulmonary/Chest: Effort normal and breath sounds normal. No respiratory distress.  Abdominal: Soft. There is no tenderness.  Musculoskeletal:  Patient has no significant midline lumbar tenderness.  He points to the right buttock and right lateral hip stating that this is where his pain is but is only present when he bends a certain way.  I am unable to elicit pain with palpation of this area.  He has 5/5 strength of bilateral lower extremities.  Has sensory changes to the right lower leg and right lateral upper leg, but denies overt numbness.  He is ambulatory with steady gait. No TTP to the bilat feet.  Neurological: He is alert.  Skin: Skin is warm and dry.  Psychiatric: He has a normal mood and affect.  Nursing note and vitals reviewed.    ED Treatments / Results  Labs (all labs ordered are listed, but only abnormal results are displayed) Labs Reviewed - No data to display  EKG None  Radiology Dg Lumbar Spine Complete  Result Date: 03/31/2018 CLINICAL DATA:  Pain in the hip EXAM: LUMBAR SPINE - COMPLETE 4+ VIEW COMPARISON:  CT 10/19/2016 FINDINGS: Lumbar alignment is within normal limits. Vertebral body heights are maintained. Mild diffuse degenerative changes throughout the lumbar spine with disc space narrowing and anterior osteophytes. Facet  degenerative change of the lower lumbar spine. Multiple bilateral kidney stones, measuring up to 7 mm on the left and 6 mm on the right. IMPRESSION: 1. Diffuse degenerative changes without acute osseous abnormality. 2. Multiple bilateral kidney stones Electronically Signed   By: Donavan Foil M.D.   On: 03/31/2018 20:24   Dg Foot Complete Left  Result Date: 03/31/2018 CLINICAL DATA:  Tingling sensation EXAM: LEFT FOOT - COMPLETE 3+ VIEW COMPARISON:  None. FINDINGS: No fracture or malalignment. Mild hallux valgus deformity at the first MTP joint with mild bunion formation at the first metatarsal head. Mild degenerative changes at the first MTP joint. Moderate plantar calcaneal spur. Prominent dorsal osteophytes at the talonavicular articulation. Amorphous rounded calcification along the posterior surface of the tibia, not  certain if this is a dystrophic soft tissue calcification or a bony mass such is a osteochondroma IMPRESSION: 1. No acute osseous abnormality. 2. Hallux valgus deformity with mild bunion and arthritis at the first MTP joint 3. Soft tissue dystrophic calcification versus osteochondroma posterior to the distal tibia. 4. Moderate plantar calcaneal spur Electronically Signed   By: Donavan Foil M.D.   On: 03/31/2018 20:22   Dg Foot Complete Right  Result Date: 03/31/2018 CLINICAL DATA:  Tingling sensation right foot EXAM: RIGHT FOOT COMPLETE - 3+ VIEW COMPARISON:  None. FINDINGS: No fracture or malalignment. Hallux valgus deformity at the first MTP joint with degenerative change. Moderate bunion formation at the head of the first metatarsal. Moderate plantar calcaneal spur. IMPRESSION: 1. No acute osseous abnormality. 2. Calix valgus deformity at the first MTP joint with degenerative changes and bunion formation 3. Moderate plantar calcaneal spur Electronically Signed   By: Donavan Foil M.D.   On: 03/31/2018 20:20   Dg Hip Unilat W Or Wo Pelvis 2-3 Views Right  Result Date:  03/31/2018 CLINICAL DATA:  Pain in the right hip EXAM: DG HIP (WITH OR WITHOUT PELVIS) 2-3V RIGHT COMPARISON:  CT 10/19/2017 FINDINGS: Minimal SI joint degenerative change. Pubic symphysis and rami are intact. Moderate arthritis of the left hip. No fracture or malalignment. Joint space relatively maintained. Acetabular osteophyte versus small loose body adjacent to the femoral head laterally. IMPRESSION: 1. No acute osseous abnormality. 2. Minimal degenerative change of the right hip. Electronically Signed   By: Donavan Foil M.D.   On: 03/31/2018 20:26    Procedures Procedures (including critical care time)  Medications Ordered in ED Medications - No data to display   Initial Impression / Assessment and Plan / ED Course  I have reviewed the triage vital signs and the nursing notes.  Pertinent labs & imaging results that were available during my care of the patient were reviewed by me and considered in my medical decision making (see chart for details).    Final Clinical Impressions(s) / ED Diagnoses   Final diagnoses:  Sciatica of right side    Patient presented to the ED today c/o sxs consistent with sciatica. Normal neurological exam, no evidence of urinary incontinence or retention, pain is consistently reproducible. There is no evidence of AAA or concern for dissection at this time.   Patient can walk but states is painful.  No loss of bowel or bladder control.  No concern for cauda equina.  No fever, night sweats, weight loss, h/o cancer, IVDU.    He presented with paperwork from his primary care office indicating imaging studies that he was supposed to obtain.  X-ray of the right foot without acute bony abnormality. X-ray of the left foot without acute bony abnormality X-ray of the lumbar spine with degenerative changes, also with bilat kidney stones X-ray of the pelvis and right hip without acute bony abnormality  Pt reported to nursing staff that he could not stay for  results. I was unable to see the pt prior to him eloping from the ED.  ED Discharge Orders    None       Bishop Dublin 03/31/18 2117    Virgel Manifold, MD 04/03/18 1015

## 2018-03-31 NOTE — ED Triage Notes (Signed)
Pt reports right hip down to right lower leg pains and right foot tingling for weeks denies any falls or injuries. Gets worse as he stands for long periods of time. Reports will have to stretch or adjust leg to help eluviate pains.

## 2018-03-31 NOTE — ED Notes (Signed)
Pt states that he cannot stay for results.  Pt made aware of risks of leaving.  Pt expressed understanding and left.  PA made aware.

## 2018-06-07 ENCOUNTER — Emergency Department (HOSPITAL_COMMUNITY)
Admission: EM | Admit: 2018-06-07 | Discharge: 2018-06-07 | Disposition: A | Discharge status: Home / Self Care | Payer: Medicaid Other | Attending: Emergency Medicine | Admitting: Emergency Medicine

## 2018-06-07 ENCOUNTER — Emergency Department (HOSPITAL_COMMUNITY): Payer: Medicaid Other

## 2018-06-07 ENCOUNTER — Encounter (HOSPITAL_COMMUNITY): Payer: Self-pay | Admitting: *Deleted

## 2018-06-07 DIAGNOSIS — E119 Type 2 diabetes mellitus without complications: Secondary | ICD-10-CM | POA: Insufficient documentation

## 2018-06-07 DIAGNOSIS — Z79899 Other long term (current) drug therapy: Secondary | ICD-10-CM | POA: Insufficient documentation

## 2018-06-07 DIAGNOSIS — J45901 Unspecified asthma with (acute) exacerbation: Secondary | ICD-10-CM | POA: Insufficient documentation

## 2018-06-07 LAB — BASIC METABOLIC PANEL
Anion gap: 9 (ref 5–15)
BUN: 19 mg/dL (ref 8–23)
CHLORIDE: 109 mmol/L (ref 98–111)
CO2: 23 mmol/L (ref 22–32)
Calcium: 8.6 mg/dL — ABNORMAL LOW (ref 8.9–10.3)
Creatinine, Ser: 1.41 mg/dL — ABNORMAL HIGH (ref 0.61–1.24)
GFR calc Af Amer: >60 mL/min (ref >60)
GFR calc non Af Amer: 53 mL/min — ABNORMAL LOW (ref >60)
GLUCOSE: 123 mg/dL — AB (ref 70–99)
POTASSIUM: 4.1 mmol/L (ref 3.5–5.1)
Sodium: 141 mmol/L (ref 135–145)

## 2018-06-07 LAB — CBC WITH DIFFERENTIAL/PLATELET
ABS IMMATURE GRANULOCYTES: 0.04 10*3/uL (ref 0.00–0.07)
Basophils Absolute: 0 10*3/uL (ref 0.0–0.1)
Basophils Relative: 0 %
Eosinophils Absolute: 0.2 10*3/uL (ref 0.0–0.5)
Eosinophils Relative: 3 %
HCT: 39.1 % (ref 39.0–52.0)
HEMOGLOBIN: 11.9 g/dL — AB (ref 13.0–17.0)
Immature Granulocytes: 1 %
LYMPHS PCT: 20 %
Lymphs Abs: 1.5 10*3/uL (ref 0.7–4.0)
MCH: 26.4 pg (ref 26.0–34.0)
MCHC: 30.4 g/dL (ref 30.0–36.0)
MCV: 86.9 fL (ref 80.0–100.0)
MONO ABS: 0.6 10*3/uL (ref 0.1–1.0)
MONOS PCT: 7 %
NEUTROS ABS: 5.3 10*3/uL (ref 1.7–7.7)
Neutrophils Relative %: 69 %
Platelets: 260 10*3/uL (ref 150–400)
RBC: 4.5 MIL/uL (ref 4.22–5.81)
RDW: 14 % (ref 11.5–15.5)
WBC: 7.6 10*3/uL (ref 4.0–10.5)
nRBC: 0 % (ref 0.0–0.2)

## 2018-06-07 MED ORDER — ALBUTEROL SULFATE (2.5 MG/3ML) 0.083% IN NEBU
5.0000 mg | INHALATION_SOLUTION | Freq: Once | RESPIRATORY_TRACT | Status: AC
  Administered 2018-06-07: 5 mg via RESPIRATORY_TRACT
  Filled 2018-06-07: qty 6

## 2018-06-07 MED ORDER — LEVOFLOXACIN 750 MG PO TABS: 750.0000 mg | ORAL_TABLET | Freq: Every day | ORAL | 0 refills | Status: DC

## 2018-06-07 MED ORDER — ACETAMINOPHEN 325 MG PO TABS
650.0000 mg | ORAL_TABLET | Freq: Once | ORAL | Status: AC
  Administered 2018-06-07: 650 mg via ORAL
  Filled 2018-06-07: qty 2

## 2018-06-07 MED ORDER — LEVOFLOXACIN 750 MG PO TABS
750.0000 mg | ORAL_TABLET | Freq: Once | ORAL | Status: AC
  Administered 2018-06-07: 750 mg via ORAL
  Filled 2018-06-07: qty 1

## 2018-06-07 MED ORDER — PREDNISONE 20 MG PO TABS: 20.0000 mg | ORAL_TABLET | Freq: Two times a day (BID) | ORAL | 0 refills | Status: DC

## 2018-06-07 MED ORDER — PREDNISONE 20 MG PO TABS: 60.0000 mg | ORAL_TABLET | Freq: Once | ORAL | Status: DC

## 2018-06-07 NOTE — ED Notes (Signed)
Bed: WA02 Expected date:  Expected time:  Means of arrival:  Comments: EMS 63 yo male SOB chest pain/duonebs, solumedrol 125 mg IV, mag drip

## 2018-06-07 NOTE — ED Triage Notes (Signed)
Patient arrives by [facility] with complaints of respiratory distress-hx asthma-SOB x 3 days. Wheezing and chest pain. EMS administered Solumedrol 125 mg IV, Mag 2 grams, albuterol 15 mg and 1 mg atrovent. #20 left forearm.

## 2018-06-07 NOTE — ED Provider Notes (Signed)
[facility] DEPT Provider Note   CSN: [id number] Arrival date & time: 06/07/18  0701     History   Chief Complaint Chief Complaint  Patient presents with  . Respiratory Distress    HPI Charles Riggs is a 63 y.o. male.  HPI   He presents for evaluation of shortness of breath not improving with home medication.  He complains of increasing shortness of breath for several days and thinks he might need an antibiotic.  He is producing a very small amount of sputum when he coughs but tends to produce more sputum and it is green in color, early in the morning.  He was treated by EMS during transport with Solu-Medrol, magnesium, and high-dose albuterol.  He denies fever, chills, weakness or dizziness.  No other recent illnesses.  There are no other known modifying factors.  Past Medical History:  Diagnosis Date  . Asthma     Patient Active Problem List   Diagnosis Date Noted  . LEG PAIN 04/26/2010  . DEPRESSION 08/19/2009  . RENAL CALCULUS 01/05/2009  . DM 10/12/2008  . COCAINE ABUSE, HX OF 06/16/2007  . ASTHMA 03/24/2007  . GERD 03/24/2007    Past Surgical History:  Procedure Laterality Date  . HERNIA REPAIR          Home Medications    Prior to Admission medications   Medication Sig Start Date End Date Taking? Authorizing Provider  acetaminophen (TYLENOL) 500 MG tablet Take 1,000 mg by mouth daily as needed for mild pain or moderate pain.    Yes [provider]  albuterol (PROVENTIL HFA;VENTOLIN HFA) 108 (90 Base) MCG/ACT inhaler Inhale 2 puffs into the lungs every 6 (six) hours as needed for wheezing or shortness of breath.   Yes [provider]  albuterol (PROVENTIL) (2.5 MG/3ML) 0.083% nebulizer solution Take 3 mLs (2.5 mg total) by nebulization every 6 (six) hours as needed for wheezing or shortness of breath. 11/14/17  Yes Noemi Chapel, MD  COMBIVENT RESPIMAT 20-100 MCG/ACT AERS respimat Inhale 2 puffs into the lungs  daily as needed for shortness of breath. Patient taking differently: Inhale 2 puffs into the lungs every 6 (six) hours as needed for shortness of breath.  12/10/16  Yes Law, Alexandra M, PA-C  fluticasone (FLONASE) 50 MCG/ACT nasal spray Place 2 sprays into both nostrils daily as needed for allergies or rhinitis.   Yes [provider]  gabapentin (NEURONTIN) 100 MG capsule Take 100 mg by mouth 3 (three) times daily.   Yes [provider]  guaiFENesin (MUCINEX) 600 MG 12 hr tablet Take 600 mg by mouth 2 (two) times daily as needed for cough.   Yes [provider]  naproxen sodium (ALEVE) 220 MG tablet Take 440 mg by mouth 2 (two) times daily as needed (pain).   Yes [provider]  levofloxacin (LEVAQUIN) 750 MG tablet Take 1 tablet (750 mg total) by mouth daily. X 7 days 06/07/18   Daleen Bo, MD  predniSONE (DELTASONE) 20 MG tablet Take 1 tablet (20 mg total) by mouth 2 (two) times daily. 06/07/18   Daleen Bo, MD  sildenafil (VIAGRA) 50 MG tablet Take 50 mg by mouth as needed for erectile dysfunction. 11/21/16   [provider]    Family History No family history on file.  Social History Social History   Tobacco Use  . Smoking status: Never Smoker  . Smokeless tobacco: Never Used  Substance Use Topics  . Alcohol use: No  .  Drug use: No     Allergies   Patient has no known allergies.   Review of Systems Review of Systems  All other systems reviewed and are negative.    Physical Exam Updated Vital Signs BP (!) 154/82   Pulse 86   Temp 98 F (36.7 C) (Oral)   Resp 18   SpO2 96%   Physical Exam Vitals signs and nursing note reviewed.  Constitutional:      General: He is not in acute distress.    Appearance: Normal appearance. He is well-developed. He is not ill-appearing, toxic-appearing or diaphoretic.  HENT:     Head: Normocephalic and atraumatic.     Right Ear: External ear normal.     Left Ear: External ear  normal.  Eyes:     Conjunctiva/sclera: Conjunctivae normal.     Pupils: Pupils are equal, round, and reactive to light.  Neck:     Musculoskeletal: Normal range of motion and neck supple.     Trachea: Phonation normal.  Cardiovascular:     Rate and Rhythm: Normal rate and regular rhythm.     Heart sounds: Normal heart sounds.  Pulmonary:     Effort: Pulmonary effort is normal. No respiratory distress.     Comments: Somewhat decreased air movement bilaterally.  Few scattered wheezes.  No rhonchi or rales. Abdominal:     Palpations: Abdomen is soft.     Tenderness: There is no abdominal tenderness.  Musculoskeletal: Normal range of motion.  Skin:    General: Skin is warm and dry.  Neurological:     Mental Status: He is alert and oriented to person, place, and time.     Cranial Nerves: No cranial nerve deficit.     Sensory: No sensory deficit.     Motor: No abnormal muscle tone.     Coordination: Coordination normal.  Psychiatric:        Behavior: Behavior normal.        Thought Content: Thought content normal.        Judgment: Judgment normal.      ED Treatments / Results  Labs (all labs ordered are listed, but only abnormal results are displayed) Labs Reviewed  BASIC METABOLIC PANEL - Abnormal; Notable for the following components:      Result Value   Glucose, Bld 123 (*)    Creatinine, Ser 1.41 (*)    Calcium 8.6 (*)    GFR calc non Af Amer 53 (*)    All other components within normal limits  CBC WITH DIFFERENTIAL/PLATELET - Abnormal; Notable for the following components:   Hemoglobin 11.9 (*)    All other components within normal limits    EKG None  Radiology Dg Chest 2 View  Result Date: 06/07/2018 CLINICAL DATA:  Patient with respiratory distress. EXAM: CHEST - 2 VIEW COMPARISON:  Chest radiograph 03/15/2018 FINDINGS: Enlarged cardiac and mediastinal contours. Diffuse bilateral interstitial pulmonary opacities. Small bilateral pleural effusions. Thoracic  spine degenerative changes. IMPRESSION: Cardiomegaly with mild interstitial edema. Electronically Signed   By: Lovey Newcomer M.D.   On: 06/07/2018 08:38    Procedures Procedures (including critical care time)  Medications Ordered in ED Medications  levofloxacin (LEVAQUIN) tablet 750 mg (has no administration in time range)  albuterol (PROVENTIL) (2.5 MG/3ML) 0.083% nebulizer solution 5 mg (5 mg Nebulization Given 06/07/18 0741)  acetaminophen (TYLENOL) tablet 650 mg (650 mg Oral Given 06/07/18 0840)     Initial Impression / Assessment and Plan / ED Course  I have  reviewed the triage vital signs and the nursing notes.  Pertinent labs & imaging results that were available during my care of the patient were reviewed by me and considered in my medical decision making (see chart for details).  Clinical Course as of Jun 08 1043  Sat Jun 07, 2018  1031 CBC with Differential(!) [EW]  1031 Normal except hemoglobin slightly low   [EW]  1031 Normal except glucose high, creatinine high, calcium low, GFR low  Basic metabolic panel(!) [EW]  0349 No infiltrate, or CHF, images reviewed by me  DG Chest 2 View [EW]    Clinical Course User Index [EW] Daleen Bo, MD     Patient Vitals for the past 24 hrs:  BP Temp Temp src Pulse Resp SpO2  06/07/18 1030 (!) 154/82 - - 86 18 96 %  06/07/18 0930 (!) 147/61 - - 85 18 95 %  06/07/18 0810 - - - - - (!) 89 %  06/07/18 0734 138/77 98 F (36.7 C) Oral 95 18 (!) 89 %    10:41 AM Reevaluation with update and discussion. After initial assessment and treatment, an updated evaluation reveals patient's oxygen removed, left off for 10 minutes and he has a saturation 96% on room air.  Patient feels better at this time.  Findings discussed with patient, and his wife, all questions were answered. Daleen Bo   Medical Decision Making: Asthma exacerbation, nonspecific, with improvement after aggressive treatment.  He received steroids and magnesium as well  as high-dose nebulizer in the field.  Symptoms improved at this time.  No respiratory distress.  Doubt pneumonia or metabolic instability.  CRITICAL CARE-no Performed by: Daleen Bo  Nursing Notes Reviewed/ Care Coordinated Applicable Imaging Reviewed Interpretation of Laboratory Data incorporated into ED treatment  The patient appears reasonably screened and/or stabilized for discharge and I doubt any other medical condition or other [facility] requiring further screening, evaluation, or treatment in the ED at this time prior to discharge.  Plan: Home Medications-continue usual medications at home; Home Treatments-rest, fluids; return here if the recommended treatment, does not improve the symptoms; Recommended follow up-PCP checkup 3 to 5 days.    Final Clinical Impressions(s) / ED Diagnoses   Final diagnoses:  Moderate asthma with exacerbation, unspecified whether persistent    ED Discharge Orders         Ordered    predniSONE (DELTASONE) 20 MG tablet  2 times daily     06/07/18 1044    levofloxacin (LEVAQUIN) 750 MG tablet  Daily     06/07/18 1044           Daleen Bo, MD 06/07/18 1045

## 2018-06-07 NOTE — ED Notes (Signed)
O2 dropped to 89% after treatment, pt placed on 2L [place]

## 2018-06-07 NOTE — Discharge Instructions (Addendum)
Pick up the prescription sent to your pharmacy and start taking the prednisone this evening, and the antibiotic tomorrow morning.  Use your nebulizer every 3-4 hours as needed for cough or trouble breathing.  Call your primary care doctor for a follow-up appointment next week.  Make sure you are drinking plenty of water every day.  Return here, if needed, for problems.

## 2018-06-19 ENCOUNTER — Other Ambulatory Visit (HOSPITAL_COMMUNITY): Payer: Self-pay | Admitting: Family Medicine

## 2018-06-19 ENCOUNTER — Ambulatory Visit (HOSPITAL_COMMUNITY)
Admission: RE | Admit: 2018-06-19 | Discharge: 2018-06-19 | Disposition: A | Discharge status: Home / Self Care | Payer: Medicaid Other | Source: Ambulatory Visit | Attending: Family Medicine | Admitting: Family Medicine

## 2018-06-19 DIAGNOSIS — J90 Pleural effusion, not elsewhere classified: Secondary | ICD-10-CM

## 2018-07-14 ENCOUNTER — Ambulatory Visit (INDEPENDENT_AMBULATORY_CARE_PROVIDER_SITE_OTHER): Payer: No Typology Code available for payment source | Admitting: Cardiology

## 2018-07-14 ENCOUNTER — Encounter: Payer: Self-pay | Admitting: Cardiology

## 2018-07-14 VITALS — BP 154/64 | HR 80 | Ht 71.0 in | Wt 263.0 lb

## 2018-07-14 DIAGNOSIS — I10 Essential (primary) hypertension: Secondary | ICD-10-CM | POA: Diagnosis not present

## 2018-07-14 DIAGNOSIS — I351 Nonrheumatic aortic (valve) insufficiency: Secondary | ICD-10-CM | POA: Diagnosis not present

## 2018-07-14 DIAGNOSIS — I447 Left bundle-branch block, unspecified: Secondary | ICD-10-CM | POA: Diagnosis not present

## 2018-07-14 DIAGNOSIS — R0602 Shortness of breath: Secondary | ICD-10-CM | POA: Diagnosis not present

## 2018-07-14 DIAGNOSIS — R9439 Abnormal result of other cardiovascular function study: Secondary | ICD-10-CM | POA: Diagnosis not present

## 2018-07-14 MED ORDER — NIFEDIPINE ER 30 MG PO TB24: 60.0000 mg | ORAL_TABLET | Freq: Every day | ORAL | 2 refills | Status: DC

## 2018-07-14 MED ORDER — FUROSEMIDE 20 MG PO TABS: 20.0000 mg | ORAL_TABLET | Freq: Every day | ORAL | 2 refills | Status: DC

## 2018-07-14 MED ORDER — NIFEDIPINE ER 30 MG PO TB24: 60.0000 mg | ORAL_TABLET | Freq: Every day | ORAL | 3 refills | Status: DC

## 2018-07-14 MED ORDER — FUROSEMIDE 20 MG PO TABS: 20.0000 mg | ORAL_TABLET | Freq: Every day | ORAL | 3 refills | Status: DC

## 2018-07-14 NOTE — Progress Notes (Signed)
Patient is here for follow up visit.  Subjective:   @Patient  ID: Charles Riggs, male    DOB: 1956-03-16, 63 y.o.   MRN: [id number]  Chief Complaint  Patient presents with  . Shortness of Breath    f/u testing, sleep study completed    HPI  Past Medical History:  Diagnosis Date  . Anxiety   . Asthma   . Nerve pain   . SOB (shortness of breath)    63 year old African-American male, 35 PY former smoker, OSA, LBBB, h/o asthma, was seen by me for shortness of breath.  His prior echo report from [place] in 2019 does not show complete informaiton regarding his LV size and function. His stress test showed small sized, fixed, inferior perfusion defect with reduced LVERF of 38%.  I had recommended echcoardiogram followed by office visit. He did not have an echocardiogram performed prior to today's visit. Thus, I recommended echocardiogram to be performed today, followed by office visit with me.   Patient underwent echocardiogram earlier today, details below. At this time, patient's biggest complaint and limiting factor is his right hi and leg pain. He does not have lifestyle limiting shortness of breath. He does endorse leg edema.   Labs 06/25/2018: Chol 201, TG 65, HDL 69, LDL 119.    Past Surgical History:  Procedure Laterality Date  . HERNIA REPAIR      Social History   Socioeconomic History  . Marital status: Single    Spouse name: Not on file  . Number of children: Not on file  . Years of education: Not on file  . Highest education level: Not on file  Occupational History  . Not on file  Social Needs  . Financial resource strain: Not on file  . Food insecurity:    Worry: Not on file    Inability: Not on file  . Transportation needs:    Medical: Not on file    Non-medical: Not on file  Tobacco Use  . Smoking status: Never Smoker  . Smokeless tobacco: Never Used  Substance and Sexual Activity  . Alcohol use: No  . Drug use: No  . Sexual activity: Not on file    Lifestyle  . Physical activity:    Days per week: Not on file    Minutes per session: Not on file  . Stress: Not on file  Relationships  . Social connections:    Talks on phone: Not on file    Gets together: Not on file    Attends religious service: Not on file    Active member of club or organization: Not on file    Attends meetings of clubs or organizations: Not on file    Relationship status: Not on file  . Intimate partner violence:    Fear of current or ex partner: Not on file    Emotionally abused: Not on file    Physically abused: Not on file    Forced sexual activity: Not on file  Other Topics Concern  . Not on file  Social History Narrative  . Not on file    Current Outpatient Medications on File Prior to Visit  Medication Sig Dispense Refill  . acetaminophen (TYLENOL) 500 MG tablet Take 1,000 mg by mouth daily as needed for mild pain or moderate pain.     Marland Kitchen albuterol (PROVENTIL HFA;VENTOLIN HFA) 108 (90 Base) MCG/ACT inhaler Inhale 2 puffs into the lungs every 6 (six) hours as needed for wheezing or shortness of  breath.    Marland Kitchen albuterol (PROVENTIL) (2.5 MG/3ML) 0.083% nebulizer solution Take 3 mLs (2.5 mg total) by nebulization every 6 (six) hours as needed for wheezing or shortness of breath. 75 mL 12  . COMBIVENT RESPIMAT 20-100 MCG/ACT AERS respimat Inhale 2 puffs into the lungs daily as needed for shortness of breath. (Patient taking differently: Inhale 2 puffs into the lungs every 6 (six) hours as needed for shortness of breath. ) 1 Inhaler 0  . fluticasone (FLONASE) 50 MCG/ACT nasal spray Place 2 sprays into both nostrils daily as needed for allergies or rhinitis.    Marland Kitchen gabapentin (NEURONTIN) 100 MG capsule Take 400 mg by mouth 3 (three) times daily.     Marland Kitchen guaiFENesin (MUCINEX) 600 MG 12 hr tablet Take 600 mg by mouth 2 (two) times daily as needed for cough.    . naproxen sodium (ALEVE) 220 MG tablet Take 440 mg by mouth 2 (two) times daily as needed (pain).    .  sildenafil (VIAGRA) 50 MG tablet Take 50 mg by mouth as needed for erectile dysfunction.     No current facility-administered medications on file prior to visit.     Cardiovascular studies:  Echocardiogram 07/14/2018: Left ventricle cavity is normal in size. Moderate concentric hypertrophy of the left ventricle. Normal global wall motion. Unable to evaluate diastolic function due to severity of aortic regurgitation. Calculated EF 55%. Left atrial cavity is mildly dilated. Probably tricuspid valve with central coaptation defect. Severe aortic regurgitation. Mild to moderate mitral regurgitation. Mild tricuspid regurgitation.  No evidence of pulmonary hypertension.  Lexiscan myoview stress test 06/23/2018:  1. Lexiscan stress test was performed. Exercise capacity was notassessed. Stress symptoms included dizziness and headache. Resting blood pressure was 138/62 mmHg and peak effect blood pressure was 162/64 mmHg. The resting and stress electrocardiogram demonstrated normal sinus rhythm, LBBB and no resting arrhythmias. Stress EKG is non diagnostic for ischemia as it is a pharmacologic stress.  2. The overall quality of the study is good. Left ventricular cavity is noted to be enlarged on the rest and stress studies. Gated SPECT images reveal global decrease in myocardial thickening and wall motion. The left ventricular ejection fraction was calculated or visually estimated to be 38%. SPECT images reveal small sized, fixed, inferior perfusion defect. Findings likely represent dilated nonischemic cardiomyopathy.  3. High risk study.   Review of Systems  Constitution: Negative for decreased appetite, malaise/fatigue, weight gain and weight loss.  HENT: Negative for congestion.   Eyes: Negative for visual disturbance.  Cardiovascular: Positive for dyspnea on exertion (Mild, stable). Negative for chest pain, claudication, leg swelling, palpitations and syncope.  Respiratory: Negative for  shortness of breath.   Endocrine: Negative for cold intolerance.  Hematologic/Lymphatic: Does not bruise/bleed easily.  Skin: Negative for itching and rash.  Musculoskeletal: Positive for back pain. Negative for myalgias.       Leg pain  Gastrointestinal: Negative for abdominal pain, nausea and vomiting.  Genitourinary: Negative for dysuria.       Erectile dysfunction  Neurological: Negative for dizziness and weakness.  Psychiatric/Behavioral: The patient is not nervous/anxious.   All other systems reviewed and are negative.      Objective:   Vitals:   07/14/18 1251  BP: (!) 154/64  Pulse: 80  SpO2: 97%     Physical Exam  Constitutional: He is oriented to person, place, and time. He appears well-developed and well-nourished. No distress.  HENT:  Head: Normocephalic and atraumatic.  Eyes: Pupils are equal, round, and  reactive to light. Conjunctivae are normal.  Neck: No JVD present.  Cardiovascular: Normal rate, regular rhythm and intact distal pulses.  Murmur (III/VI early diastolic murmur LLSB) heard. Pulmonary/Chest: Effort normal and breath sounds normal. He has no wheezes. He has no rales.  Abdominal: Soft. Bowel sounds are normal. There is no rebound.  Musculoskeletal:        General: Edema (1+ b/l) present.  Lymphadenopathy:    He has no cervical adenopathy.  Neurological: He is alert and oriented to person, place, and time. No cranial nerve deficit.  Skin: Skin is warm and dry.  Psychiatric: He has a normal mood and affect.  Nursing note and vitals reviewed.       Assessment & Recommendations:   63 year old African-American male, 9 PY former smoker, OSA, LBBB, h/o asthma, new diagnosis of severe AI, abnormal stress test  1. Nonrheumatic aortic valve insufficiency: While central coaptation defect is seen, etiology is unclear. Patient does not have LV dysfunction, LV dilatation, or lifestyle limiting symptoms at this time. Hi bigger complaints are hip and leg  pain. As such, not acute surgical indication. Patient would also like to hold off for now. Recommend nifedipine 60 mg, lasix 20 mg daily. Reassured the patient that this is unlikely to cause erectile dysfunction, as this was questioned by the patient. While on nifedipine, strongly recommend avoiding sildenafil to avoid hypotension. I will see him back in 6 weeks for follow up. Repeat echocardiogram in 6 months. If any clinical change, will then need TEE before surgical consultation.  2. Abnormal stress test: Small, fixed, inferior perfusion defect likely attenuation artifact. EF is low on stress test, but normal on echocardiogram. Cath not recommended at this time.   3. Hypertension: Nifedipine & lasix, as above  Return in 6 weeks.   Nigel Mormon, MD [facility] Pager: [phone] Office: [phone] If no answer Cell [phone]

## 2018-08-27 ENCOUNTER — Inpatient Hospital Stay (HOSPITAL_COMMUNITY): Payer: No Typology Code available for payment source

## 2018-08-27 ENCOUNTER — Ambulatory Visit: Payer: Non-veteran care | Admitting: Cardiology

## 2018-08-27 ENCOUNTER — Inpatient Hospital Stay (HOSPITAL_COMMUNITY)
Admission: EM | Admit: 2018-08-27 | Discharge: 2018-08-28 | DRG: 293 | Disposition: A | Discharge status: Home / Self Care | Payer: No Typology Code available for payment source | Attending: Cardiology | Admitting: Cardiology

## 2018-08-27 ENCOUNTER — Emergency Department (HOSPITAL_COMMUNITY): Payer: No Typology Code available for payment source

## 2018-08-27 ENCOUNTER — Encounter (HOSPITAL_COMMUNITY): Payer: Self-pay | Admitting: Cardiology

## 2018-08-27 ENCOUNTER — Other Ambulatory Visit: Payer: Self-pay

## 2018-08-27 DIAGNOSIS — I351 Nonrheumatic aortic (valve) insufficiency: Secondary | ICD-10-CM

## 2018-08-27 DIAGNOSIS — Z825 Family history of asthma and other chronic lower respiratory diseases: Secondary | ICD-10-CM | POA: Diagnosis not present

## 2018-08-27 DIAGNOSIS — R7989 Other specified abnormal findings of blood chemistry: Secondary | ICD-10-CM | POA: Diagnosis present

## 2018-08-27 DIAGNOSIS — I5033 Acute on chronic diastolic (congestive) heart failure: Secondary | ICD-10-CM | POA: Diagnosis present

## 2018-08-27 DIAGNOSIS — E119 Type 2 diabetes mellitus without complications: Secondary | ICD-10-CM | POA: Diagnosis present

## 2018-08-27 DIAGNOSIS — Q238 Other congenital malformations of aortic and mitral valves: Secondary | ICD-10-CM | POA: Diagnosis not present

## 2018-08-27 DIAGNOSIS — T501X6A Underdosing of loop [high-ceiling] diuretics, initial encounter: Secondary | ICD-10-CM | POA: Diagnosis present

## 2018-08-27 DIAGNOSIS — Z7951 Long term (current) use of inhaled steroids: Secondary | ICD-10-CM | POA: Diagnosis not present

## 2018-08-27 DIAGNOSIS — R0902 Hypoxemia: Secondary | ICD-10-CM | POA: Diagnosis present

## 2018-08-27 DIAGNOSIS — R0602 Shortness of breath: Secondary | ICD-10-CM | POA: Diagnosis not present

## 2018-08-27 DIAGNOSIS — K219 Gastro-esophageal reflux disease without esophagitis: Secondary | ICD-10-CM | POA: Diagnosis present

## 2018-08-27 DIAGNOSIS — I447 Left bundle-branch block, unspecified: Secondary | ICD-10-CM | POA: Diagnosis present

## 2018-08-27 DIAGNOSIS — I160 Hypertensive urgency: Secondary | ICD-10-CM | POA: Diagnosis present

## 2018-08-27 DIAGNOSIS — Z9114 Patient's other noncompliance with medication regimen: Secondary | ICD-10-CM

## 2018-08-27 DIAGNOSIS — I10 Essential (primary) hypertension: Secondary | ICD-10-CM | POA: Diagnosis present

## 2018-08-27 DIAGNOSIS — R778 Other specified abnormalities of plasma proteins: Secondary | ICD-10-CM

## 2018-08-27 DIAGNOSIS — G4733 Obstructive sleep apnea (adult) (pediatric): Secondary | ICD-10-CM | POA: Diagnosis present

## 2018-08-27 DIAGNOSIS — F329 Major depressive disorder, single episode, unspecified: Secondary | ICD-10-CM | POA: Diagnosis present

## 2018-08-27 DIAGNOSIS — I2510 Atherosclerotic heart disease of native coronary artery without angina pectoris: Secondary | ICD-10-CM | POA: Diagnosis present

## 2018-08-27 DIAGNOSIS — F419 Anxiety disorder, unspecified: Secondary | ICD-10-CM | POA: Diagnosis present

## 2018-08-27 DIAGNOSIS — Z87891 Personal history of nicotine dependence: Secondary | ICD-10-CM

## 2018-08-27 DIAGNOSIS — R0789 Other chest pain: Secondary | ICD-10-CM | POA: Diagnosis not present

## 2018-08-27 DIAGNOSIS — R072 Precordial pain: Secondary | ICD-10-CM

## 2018-08-27 DIAGNOSIS — Z79899 Other long term (current) drug therapy: Secondary | ICD-10-CM

## 2018-08-27 DIAGNOSIS — R079 Chest pain, unspecified: Secondary | ICD-10-CM | POA: Diagnosis present

## 2018-08-27 DIAGNOSIS — I110 Hypertensive heart disease with heart failure: Principal | ICD-10-CM | POA: Diagnosis present

## 2018-08-27 HISTORY — DX: Acute on chronic diastolic (congestive) heart failure: I50.33

## 2018-08-27 HISTORY — DX: Obstructive sleep apnea (adult) (pediatric): G47.33

## 2018-08-27 HISTORY — DX: Other congenital malformations of aortic and mitral valves: Q23.8

## 2018-08-27 HISTORY — DX: Nonrheumatic aortic (valve) insufficiency: I35.1

## 2018-08-27 HISTORY — DX: Essential (primary) hypertension: I10

## 2018-08-27 LAB — CBC WITH DIFFERENTIAL/PLATELET
Abs Immature Granulocytes: 0.02 10*3/uL (ref 0.00–0.07)
Basophils Absolute: 0 10*3/uL (ref 0.0–0.1)
Basophils Relative: 0 %
Eosinophils Absolute: 0 10*3/uL (ref 0.0–0.5)
Eosinophils Relative: 0 %
HCT: 41.9 % (ref 39.0–52.0)
Hemoglobin: 13.1 g/dL (ref 13.0–17.0)
Immature Granulocytes: 0 %
Lymphocytes Relative: 19 %
Lymphs Abs: 1.7 10*3/uL (ref 0.7–4.0)
MCH: 26.8 pg (ref 26.0–34.0)
MCHC: 31.3 g/dL (ref 30.0–36.0)
MCV: 85.7 fL (ref 80.0–100.0)
Monocytes Absolute: 0.5 10*3/uL (ref 0.1–1.0)
Monocytes Relative: 6 %
Neutro Abs: 6.6 10*3/uL (ref 1.7–7.7)
Neutrophils Relative %: 75 %
Platelets: 263 10*3/uL (ref 150–400)
RBC: 4.89 MIL/uL (ref 4.22–5.81)
RDW: 13.2 % (ref 11.5–15.5)
WBC: 8.8 10*3/uL (ref 4.0–10.5)
nRBC: 0 % (ref 0.0–0.2)

## 2018-08-27 LAB — TROPONIN I
Troponin I: <0.03 ng/mL (ref <0.03)
Troponin I: 0.04 ng/mL (ref <0.03)
Troponin I: 0.06 ng/mL (ref <0.03)

## 2018-08-27 LAB — CREATININE, SERUM
Creatinine, Ser: 1.53 mg/dL — ABNORMAL HIGH (ref 0.61–1.24)
GFR calc Af Amer: 56 mL/min — ABNORMAL LOW (ref >60)
GFR calc non Af Amer: 48 mL/min — ABNORMAL LOW (ref >60)

## 2018-08-27 LAB — BASIC METABOLIC PANEL
Anion gap: 11 (ref 5–15)
BUN: 10 mg/dL (ref 8–23)
CO2: 23 mmol/L (ref 22–32)
Calcium: 9 mg/dL (ref 8.9–10.3)
Chloride: 103 mmol/L (ref 98–111)
Creatinine, Ser: 1.31 mg/dL — ABNORMAL HIGH (ref 0.61–1.24)
GFR calc Af Amer: >60 mL/min (ref >60)
GFR calc non Af Amer: 58 mL/min — ABNORMAL LOW (ref >60)
Glucose, Bld: 141 mg/dL — ABNORMAL HIGH (ref 70–99)
Potassium: 4.6 mmol/L (ref 3.5–5.1)
Sodium: 137 mmol/L (ref 135–145)

## 2018-08-27 LAB — CBC
HCT: 41.5 % (ref 39.0–52.0)
Hemoglobin: 13 g/dL (ref 13.0–17.0)
MCH: 26.3 pg (ref 26.0–34.0)
MCHC: 31.3 g/dL (ref 30.0–36.0)
MCV: 84 fL (ref 80.0–100.0)
Platelets: 287 10*3/uL (ref 150–400)
RBC: 4.94 MIL/uL (ref 4.22–5.81)
RDW: 13.3 % (ref 11.5–15.5)
WBC: 10.1 10*3/uL (ref 4.0–10.5)
nRBC: 0 % (ref 0.0–0.2)

## 2018-08-27 LAB — ECHOCARDIOGRAM LIMITED
Height: 71 in
Weight: 4072 oz

## 2018-08-27 LAB — BRAIN NATRIURETIC PEPTIDE: B Natriuretic Peptide: 304.6 pg/mL — ABNORMAL HIGH (ref 0.0–100.0)

## 2018-08-27 MED ORDER — METOPROLOL TARTRATE 5 MG/5ML IV SOLN
2.5000 mg | INTRAVENOUS | Status: AC | PRN
  Administered 2018-08-27 (×2): 2.5 mg via INTRAVENOUS
  Filled 2018-08-27 (×2): qty 5

## 2018-08-27 MED ORDER — FLUTICASONE PROPIONATE 50 MCG/ACT NA SUSP
2.0000 | Freq: Every day | NASAL | Status: DC | PRN
  Filled 2018-08-27: qty 16

## 2018-08-27 MED ORDER — NITROGLYCERIN 0.4 MG SL SUBL: 0.4000 mg | SUBLINGUAL_TABLET | SUBLINGUAL | Status: DC | PRN

## 2018-08-27 MED ORDER — GUAIFENESIN ER 600 MG PO TB12: 600.0000 mg | ORAL_TABLET | Freq: Two times a day (BID) | ORAL | Status: DC | PRN

## 2018-08-27 MED ORDER — FUROSEMIDE 10 MG/ML IJ SOLN
40.0000 mg | Freq: Once | INTRAMUSCULAR | Status: AC
  Administered 2018-08-27: 40 mg via INTRAVENOUS
  Filled 2018-08-27: qty 4

## 2018-08-27 MED ORDER — IOHEXOL 350 MG/ML SOLN
80.0000 mL | Freq: Once | INTRAVENOUS | Status: AC | PRN
  Administered 2018-08-27: 80 mL via INTRAVENOUS

## 2018-08-27 MED ORDER — ACETAMINOPHEN 500 MG PO TABS
1000.0000 mg | ORAL_TABLET | Freq: Every day | ORAL | Status: DC | PRN
  Administered 2018-08-27: 1000 mg via ORAL
  Filled 2018-08-27: qty 2

## 2018-08-27 MED ORDER — ENOXAPARIN SODIUM 40 MG/0.4ML ~~LOC~~ SOLN
40.0000 mg | Freq: Every day | SUBCUTANEOUS | Status: DC
  Administered 2018-08-27: 22:00:00 40 mg via SUBCUTANEOUS
  Filled 2018-08-27: qty 0.4

## 2018-08-27 MED ORDER — ALBUTEROL SULFATE (2.5 MG/3ML) 0.083% IN NEBU: 3.0000 mL | INHALATION_SOLUTION | Freq: Four times a day (QID) | RESPIRATORY_TRACT | Status: DC | PRN

## 2018-08-27 MED ORDER — NIFEDIPINE ER OSMOTIC RELEASE 60 MG PO TB24
60.0000 mg | ORAL_TABLET | Freq: Every day | ORAL | Status: DC
  Administered 2018-08-27: 60 mg via ORAL
  Filled 2018-08-27 (×2): qty 1

## 2018-08-27 MED ORDER — METOPROLOL TARTRATE 5 MG/5ML IV SOLN
5.0000 mg | INTRAVENOUS | Status: DC | PRN
  Administered 2018-08-27 (×4): 5 mg via INTRAVENOUS
  Filled 2018-08-27: qty 5

## 2018-08-27 MED ORDER — GABAPENTIN 400 MG PO CAPS
400.0000 mg | ORAL_CAPSULE | Freq: Three times a day (TID) | ORAL | Status: DC
  Administered 2018-08-27 – 2018-08-28 (×4): 400 mg via ORAL
  Filled 2018-08-27 (×5): qty 1

## 2018-08-27 MED ORDER — METOPROLOL TARTRATE 5 MG/5ML IV SOLN
2.5000 mg | Freq: Once | INTRAVENOUS | Status: AC
  Administered 2018-08-27: 15:00:00 2.5 mg via INTRAVENOUS
  Filled 2018-08-27: qty 5

## 2018-08-27 NOTE — ED Notes (Signed)
Pt ambulated without nasal canula.  O2 sats remained 98%, though rr increased to 27.

## 2018-08-27 NOTE — Consult Note (Addendum)
[address]       [place] [address]             [id number]          CARDIOTHORACIC SURGERY CONSULTATION REPORT  PCP is Reeves Dam, MD Referring Provider is Nigel Mormon, MD  Reason for consultation:  Severe aortic insufficiency  HPI:  Patient is a 63 year old African-American male with history of severe aortic insufficiency, hypertension, left bundle branch block, asthma, obstructive sleep apnea, abnormal stress test and remote history of tobacco use who has been referred for surgical consultation to discuss treatment options for management of severe aortic insufficiency.  Patient states that he has had some degree of exertional shortness of breath that has slowly progressed for years.  He denies any previous history of heart murmur or rheumatic fever.  Over the past year symptoms of exertional shortness of breath progressed, and approximately 3 months ago he developed more severe episodes of exertional shortness of breath with occasional paroxysmal episodes of resting shortness of breath.  Initially he was thought to be suffering from asthma.  However, he began to experience lower extremity edema and orthopnea.  He was referred for cardiology consultation and seen by Dr. Virgina Jock in early February.  An echocardiogram was performed that reportedly demonstrated the presence of severe aortic insufficiency with normal left ventricular size and systolic function.  Paradoxically, a stress test was performed and reported to be high risk due to the presence of low ejection fraction.  He was notably hypertensive at the time but reportedly not experiencing active symptoms of chest pain or shortness of breath.  The patient was started on nifedipine and Lasix at that time.    The patient states that he took his medications for approximately 2 weeks but subsequently stopped because he could not afford to fill the prescriptions.  Initially he did well but after he  stopped taking his medications he began to experience worsening shortness of breath.  Last week he began to experience lower extremity edema.  He resumed taking Lasix 2 days ago.  Early this morning the patient was awoken from sleep with burning substernal chest discomfort and shortness of breath.  He was brought via EMS to the emergency department where baseline EKG revealed sinus rhythm with left bundle branch block.  Chest x-ray revealed mild pulmonary edema.  BNP was elevated 300.  Initial troponin was negative.  Oxygen saturation was in the 80s.  Symptoms improved with IV Lasix.  He was admitted to the hospital for observation and follow-up troponin level was slightly increased to 0.04 from < 0.03 at baseline.  Cardiothoracic surgical consultation was requested.  Patient is married and lives locally in [place] with his wife.  He works as a Administrator.  He states that he still exercises on a regular basis, but this is primarily weight training.  He notes that he had to give up any kind of aerobic exercise several years ago because of exertional shortness of breath.  He gets short of breath with low-level aerobic activity.  He has had some substernal chest discomfort with strenuous exertion.  He states that since he came into the hospital this morning he is feeling much better and he currently denies any ongoing chest pain or shortness of breath.  He has not had palpitations, dizzy spells, nor syncope.  He denies any recent febrile illness or cough.  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  He has not been exposed to any persons who have known or suspected COVID-19 infection.   Past Medical History:  Diagnosis Date   Anxiety    Asthma    Nerve pain    SOB (shortness of breath)     Past Surgical History:  Procedure Laterality Date   HERNIA REPAIR      Family History  Problem Relation Age of Onset   COPD Father     Social History   Socioeconomic  History   Marital status: Single    Spouse name: Not on file   Number of children: Not on file   Years of education: Not on file   Highest education level: Not on file  Occupational History   Not on file  Social Needs   Financial resource strain: Not on file   Food insecurity:    Worry: Not on file    Inability: Not on file   Transportation needs:    Medical: Not on file    Non-medical: Not on file  Tobacco Use   Smoking status: Former Smoker   Smokeless tobacco: Never Used   Tobacco comment: 30 PY  Substance and Sexual Activity   Alcohol use: No   Drug use: No   Sexual activity: Not on file  Lifestyle   Physical activity:    Days per week: Not on file    Minutes per session: Not on file   Stress: Not on file  Relationships   Social connections:    Talks on phone: Not on file    Gets together: Not on file    Attends religious service: Not on file    Active member of club or organization: Not on file    Attends meetings of clubs or organizations: Not on file    Relationship status: Not on file   Intimate partner violence:    Fear of current or ex partner: Not on file    Emotionally abused: Not on file    Physically abused: Not on file    Forced sexual activity: Not on file  Other Topics Concern   Not on file  Social History Narrative   Not on file    Prior to Admission medications   Medication Sig Start Date End Date Taking? Authorizing Provider  acetaminophen (TYLENOL) 500 MG tablet Take 1,000 mg by mouth daily as needed for mild pain or moderate pain.    Yes [provider]  albuterol (PROVENTIL HFA;VENTOLIN HFA) 108 (90 Base) MCG/ACT inhaler Inhale 2 puffs into the lungs every 6 (six) hours as needed for wheezing or shortness of breath.   Yes [provider]  albuterol (PROVENTIL) (2.5 MG/3ML) 0.083% nebulizer solution Take 3 mLs (2.5 mg total) by nebulization every 6 (six) hours as needed for wheezing or shortness of breath.  11/14/17  Yes Noemi Chapel, MD  azithromycin (ZITHROMAX) 250 MG tablet Take 250 mg by mouth daily. Stat dose 500mg  on 08-25-18   Yes [provider]  COMBIVENT RESPIMAT 20-100 MCG/ACT AERS respimat Inhale 2 puffs into the lungs daily as needed for shortness of breath. Patient taking differently: Inhale 2 puffs into the lungs every 6 (six) hours as needed for shortness of breath.  12/10/16  Yes Law, Alexandra M, PA-C  fluticasone (FLONASE) 50 MCG/ACT nasal spray Place 2 sprays into both nostrils daily as needed for allergies or rhinitis.   Yes [provider]  fluticasone furoate-vilanterol (BREO ELLIPTA) 100-25 MCG/INH AEPB Inhale 1 puff into the  lungs daily.   Yes [provider]  furosemide (LASIX) 20 MG tablet Take 1 tablet (20 mg total) by mouth daily. 07/14/18 10/12/18 Yes [name], Manish J, MD  gabapentin (NEURONTIN) 300 MG capsule Take 300 mg by mouth 3 (three) times daily.   Yes [provider]  guaiFENesin (MUCINEX) 600 MG 12 hr tablet Take 600 mg by mouth 2 (two) times daily as needed for cough.   Yes [provider]  hydrOXYzine (VISTARIL) 25 MG capsule Take 25 mg by mouth 3 (three) times daily.   Yes [provider]  montelukast (SINGULAIR) 10 MG tablet Take 10 mg by mouth every evening.   Yes [provider]  naproxen sodium (ALEVE) 220 MG tablet Take 440 mg by mouth 2 (two) times daily as needed (pain).   Yes [provider]  NIFEdipine (ADALAT CC) 30 MG 24 hr tablet Take 2 tablets (60 mg total) by mouth daily. 07/14/18  Yes [name], Manish J, MD  oxyCODONE-acetaminophen (PERCOCET/ROXICET) 5-325 MG tablet Take 1 tablet by mouth every 12 (twelve) hours as needed for severe pain.   Yes [provider]  predniSONE (STERAPRED UNI-PAK 21 TAB) 10 MG (21) TBPK tablet Take 10 mg by mouth daily. Started on 08-25-18 DS 6 days   Yes [provider]  gabapentin (NEURONTIN) 100 MG capsule Take 400 mg by mouth  3 (three) times daily.     [provider]    Current Facility-Administered Medications  Medication Dose Route Frequency Provider Last Rate Last Dose   acetaminophen (TYLENOL) tablet 1,000 mg  1,000 mg Oral Daily PRN [name], Manish J, MD       albuterol (PROVENTIL) (2.5 MG/3ML) 0.083% nebulizer solution 3 mL  3 mL Inhalation Q6H PRN [name], Manish J, MD       enoxaparin (LOVENOX) injection 40 mg  40 mg Subcutaneous QHS [name], Manish J, MD       fluticasone (FLONASE) 50 MCG/ACT nasal spray 2 spray  2 spray Each Nare Daily PRN [name], Manish J, MD       furosemide (LASIX) injection 40 mg  40 mg Intravenous Once [name], Manish J, MD       gabapentin (NEURONTIN) capsule 400 mg  400 mg Oral TID [name], Manish J, MD   400 mg at 08/27/18 1119   guaiFENesin (MUCINEX) 12 hr tablet 600 mg  600 mg Oral BID PRN [name], Manish J, MD       metoprolol tartrate (LOPRESSOR) injection 2.5 mg  2.5 mg Intravenous Q5 min PRN [name], Manish J, MD       metoprolol tartrate (LOPRESSOR) injection 2.5 mg  2.5 mg Intravenous Once [name], Manish J, MD       NIFEdipine (PROCARDIA XL/NIFEDICAL XL) 24 hr tablet 60 mg  60 mg Oral Daily [name], Manish J, MD   60 mg at 08/27/18 1119   nitroGLYCERIN (NITROSTAT) SL tablet 0.4 mg  0.4 mg Sublingual Q5 min PRN Fawze, Mina A, PA-C        No Known Allergies    Review of Systems:   General:  normal appetite, normal energy, no weight gain, no weight loss, no fever  Cardiac:  + chest pain with exertion, + chest pain at rest, + SOB with exertion, + resting SOB, no PND, + orthopnea, no palpitations, no arrhythmia, no atrial fibrillation, + LE edema, no dizzy spells, no syncope  Respiratory:  + shortness of breath, no home oxygen, no productive cough, no dry cough, no bronchitis, no wheezing,  no hemoptysis, no asthma, no pain with inspiration or cough, + sleep apnea, no CPAP at night  GI:   no difficulty swallowing, no  reflux, no frequent heartburn, no hiatal hernia, no abdominal pain, no constipation, no diarrhea, no hematochezia, no hematemesis, no melena  GU:   no dysuria,  no frequency, no urinary tract infection, no hematuria, no enlarged prostate, no kidney stones, no kidney disease  Vascular:  no pain suggestive of claudication, no pain in feet, no leg cramps, no varicose veins, no DVT, no non-healing foot ulcer  Neuro:   no stroke, no TIA's, no seizures, no headaches, no temporary blindness one eye,  no slurred speech, no peripheral neuropathy, + chronic pain, no instability of gait, no memory/cognitive dysfunction  Musculoskeletal: + arthritis - primarily involving the right hip, no joint swelling, no myalgias, no difficulty walking, normal mobility   Skin:   no rash, no itching, no skin infections, no pressure sores or ulcerations  Psych:   no anxiety, no depression, no nervousness, no unusual recent stress  Eyes:   no blurry vision, no floaters, no recent vision changes, + wears glasses for reading  ENT:   no hearing loss, no loose or painful teeth, no dentures, last saw dentist 3-4 years ago  Hematologic:  no easy bruising, no abnormal bleeding, no clotting disorder, no frequent epistaxis  Endocrine:  no diabetes, does not check CBG's at home     Physical Exam:   BP (!) 145/67 (BP Location: Right Arm)    Pulse 85    Temp 98.3 F (36.8 C) (Oral)    Resp 18    Ht 5\' 11"  (1.803 m)    Wt 115.4 kg    SpO2 96%    BMI 35.50 kg/m   General:  Mildly obese,  well-appearing  HEENT:  Unremarkable   Neck:   no JVD, no bruits, no adenopathy   Chest:   clear to auscultation, symmetrical breath sounds, no wheezes, no rhonchi   CV:   RRR, grade III/VI diastolic murmur   Abdomen:  soft, non-tender, no masses   Extremities:  warm, well-perfused, pulses palpable both legs at ankle, no lower extremity edema  Rectal/GU  Deferred  Neuro:   Grossly non-focal and symmetrical throughout  Skin:   Clean and dry, no  rashes, no breakdown  Diagnostic Tests:  Lab Results: Recent Labs    08/27/18 0604  WBC 8.8  HGB 13.1  HCT 41.9  PLT 263   BMET:  Recent Labs    08/27/18 0604  NA 137  K 4.6  CL 103  CO2 23  GLUCOSE 141*  BUN 10  CREATININE 1.31*  CALCIUM 9.0    CBG (last 3)  No results for input(s): GLUCAP in the last 72 hours. PT/INR:  No results for input(s): LABPROT, INR in the last 72 hours.  CXR:  PORTABLE CHEST 1 VIEW  COMPARISON:  06/19/2018  FINDINGS: Airspace disease on the left. Milder opacity at the right base with [employer]. No effusion or pneumothorax. Normal heart size.  Lucent appearance of the apical lungs.  IMPRESSION: Asymmetric airspace disease primarily concerning for pneumonia. Given [employer] and history of chest pain this may instead reflect acute pulmonary edema.   Electronically Signed   By: Monte Fantasia M.D.   On: 08/27/2018 06:54   EKG: NSR w/ LBBB    ECHOCARDIOGRAM  Report from transthoracic echocardiogram performed July 14, 2018 at [facility] cardiology is reviewed.  Images from this echocardiogram are  not currently available for review.  By report the patient had normal left ventricular cavity size with moderate concentric left ventricular hypertrophy.  There was severe aortic regurgitation.  The aortic valve is felt to be likely trileaflet with aortic insufficiency caused by central coaptation defect.  There was reportedly mild to moderate mitral regurgitation and mild tricuspid regurgitation.  Ejection fraction was estimated 55%.     Impression:  Patient has stage D severe symptomatic aortic insufficiency.  He presents with an acute exacerbation of chronically worsening symptoms of exertional shortness of breath.  He also reports symptoms of substernal chest discomfort, and his second Troponin level was minimally elevated.  Based upon history the patient's aortic insufficiency sounds to be chronic, but the precise time  course remains unclear.  The presence or absence of significant coronary artery disease should be ruled out.  The unlikely possibility of aortic dissection should be ruled out.  Ultimately the patient will need aortic valve replacement, but timing may depend upon the presence of of other coexisting conditions as well as his response to medical therapy.    Plan:  I recommend cardiac gated CT angiogram of the heart to rule out the possibility of aortic dissection and/or significant coronary artery disease.  Follow-up echocardiogram should be performed to reassess stability of left ventricular function.  Left and right heart catheterization may or may not be indicated depending upon the results of CTA, echo and his subsequent clinical course.  We will continue to follow along while the patient remains in the hospital.  I have discussed matters at length with the patient at the bedside this afternoon.  All questions answered.    I spent in excess of 120 minutes during the conduct of this hospital consultation and >50% of this time involved direct face-to-face encounter for counseling and/or coordination of the patient's care.    Valentina Gu. Roxy Manns, MD 08/27/2018 3:07 PM

## 2018-08-27 NOTE — ED Provider Notes (Signed)
[facility]PART[facility] Provider Note   CSN: [id number] Arrival date & time: 08/27/18  0554    History   Chief Complaint Chief Complaint  Patient presents with  . Chest Pain    HPI Charles Riggs is a 63 y.o. male with history of asthma, anxiety, GERD, depression, diabetes mellitus presents today for evaluation of acute onset, progressively improving chest pain and shortness of breath.  He reports that 4 days ago while he was spraying down his truck with diesel "some of it sprayed back and caused me to feel short of breath ".  He reports that this was transient but he called his PCP the next day who prescribed him a prednisone burst which he began yesterday.  This morning at around 3 AM he awoke feeling very short of breath.  He states that he then developed progressively worsening substernal chest pressure and tightness so he went downstairs and took 2 baby aspirin.  He states that he then went back upstairs and "tried to wait it out "but then decided to call EMS.  Also administered a breathing treatment at home with some relief.  He took another 4 baby aspirin and EMS also gave him 1 sublingual nitroglycerin tablet which he reports was helpful.  Symptoms worsen laying flat, improved sitting upright.  He did note some leg swelling a few days ago and so restarted his Lasix with resolution in his swelling.  Recently established care with a cardiologist who noted that prior echo report from [place] in 2019 shows reduced left ventricular ejection fraction of 38%.  Repeat echo performed 07/22/2018 shows severe aortic regurgitation, probable tricuspid valve with central coaptation defect, mild to moderate mitral regurgitation, mild tricuspid regurgitation, EF 55%  He is a former smoker, denies recreational drug use or excessive alcohol intake.  He denies fevers, chills, cough, abdominal pain, nausea, or vomiting.    The history is provided by the patient.    Past Medical  History:  Diagnosis Date  . Anxiety   . Asthma   . Nerve pain   . SOB (shortness of breath)     Patient Active Problem List   Diagnosis Date Noted  . Chest pain in adult 08/27/2018  . LEG PAIN 04/26/2010  . DEPRESSION 08/19/2009  . RENAL CALCULUS 01/05/2009  . DM 10/12/2008  . COCAINE ABUSE, HX OF 06/16/2007  . ASTHMA 03/24/2007  . GERD 03/24/2007    Past Surgical History:  Procedure Laterality Date  . HERNIA REPAIR          Home Medications    Prior to Admission medications   Medication Sig Start Date End Date Taking? Authorizing Provider  acetaminophen (TYLENOL) 500 MG tablet Take 1,000 mg by mouth daily as needed for mild pain or moderate pain.    Yes [provider]  albuterol (PROVENTIL HFA;VENTOLIN HFA) 108 (90 Base) MCG/ACT inhaler Inhale 2 puffs into the lungs every 6 (six) hours as needed for wheezing or shortness of breath.   Yes [provider]  albuterol (PROVENTIL) (2.5 MG/3ML) 0.083% nebulizer solution Take 3 mLs (2.5 mg total) by nebulization every 6 (six) hours as needed for wheezing or shortness of breath. 11/14/17  Yes Noemi Chapel, MD  COMBIVENT RESPIMAT 20-100 MCG/ACT AERS respimat Inhale 2 puffs into the lungs daily as needed for shortness of breath. Patient taking differently: Inhale 2 puffs into the lungs every 6 (six) hours as needed for shortness of breath.  12/10/16  Yes Law, Bea Graff, PA-C  fluticasone (FLONASE) 50 MCG/ACT nasal spray Place 2 sprays into both nostrils daily as needed for allergies or rhinitis.   Yes [provider]  furosemide (LASIX) 20 MG tablet Take 1 tablet (20 mg total) by mouth daily. 07/14/18 10/12/18 Yes [name], Manish J, MD  gabapentin (NEURONTIN) 100 MG capsule Take 400 mg by mouth 3 (three) times daily.    Yes [provider]  guaiFENesin (MUCINEX) 600 MG 12 hr tablet Take 600 mg by mouth 2 (two) times daily as needed for cough.   Yes [provider]  naproxen sodium  (ALEVE) 220 MG tablet Take 440 mg by mouth 2 (two) times daily as needed (pain).   Yes [provider]  NIFEdipine (ADALAT CC) 30 MG 24 hr tablet Take 2 tablets (60 mg total) by mouth daily. 07/14/18  Yes [name], Reynold Bowen, MD    Family History No family history on file.  Social History Social History   Tobacco Use  . Smoking status: Never Smoker  . Smokeless tobacco: Never Used  Substance Use Topics  . Alcohol use: No  . Drug use: No     Allergies   Patient has no known allergies.   Review of Systems Review of Systems  Constitutional: Negative for chills and fever.  Respiratory: Positive for shortness of breath and wheezing. Negative for cough.   Cardiovascular: Positive for chest pain and leg swelling (improved).  Gastrointestinal: Negative for abdominal pain, nausea and vomiting.  All other systems reviewed and are negative.    Physical Exam Updated Vital Signs BP (!) 144/61   Pulse 83   Temp 98.3 F (36.8 C) (Oral)   Resp 18   Ht 5\' 11"  (1.803 m)   Wt 117.9 kg   SpO2 96%   BMI 36.26 kg/m   Physical Exam Vitals signs and nursing note reviewed.  Constitutional:      General: He is not in acute distress.    Appearance: He is well-developed.  HENT:     Head: Normocephalic and atraumatic.  Eyes:     General:        Right eye: No discharge.        Left eye: No discharge.     Conjunctiva/sclera: Conjunctivae normal.  Neck:     Vascular: No JVD.     Trachea: No tracheal deviation.  Cardiovascular:     Rate and Rhythm: Normal rate and regular rhythm.     Pulses:          Radial pulses are 2+ on the right side and 2+ on the left side.       Dorsalis pedis pulses are 3+ on the right side and 3+ on the left side.       Posterior tibial pulses are 3+ on the right side and 3+ on the left side.     Heart sounds: Murmur present.     Comments: Homans sign absent bilaterally, no lower extremity edema, no palpable cords, compartments are soft   Pulmonary:     Effort: Pulmonary effort is normal.     Breath sounds: Examination of the left-middle field reveals rales. Examination of the right-lower field reveals rales. Examination of the left-lower field reveals rales. Rales present.     Comments: Left-sided Rales noted.  SPO2 saturations 88 to 96% on room air. Abdominal:     General: Bowel sounds are normal. There is no distension.     Palpations: Abdomen is soft. There is no mass.  Tenderness: There is no abdominal tenderness.  Musculoskeletal:     Right lower leg: He exhibits no tenderness. No edema.     Left lower leg: He exhibits no tenderness. No edema.  Skin:    General: Skin is warm and dry.     Findings: No erythema.  Neurological:     Mental Status: He is alert.  Psychiatric:        Behavior: Behavior normal.      ED Treatments / Results  Labs (all labs ordered are listed, but only abnormal results are displayed) Labs Reviewed  BASIC METABOLIC PANEL - Abnormal; Notable for the following components:      Result Value   Glucose, Bld 141 (*)    Creatinine, Ser 1.31 (*)    GFR calc non Af Amer 58 (*)    All other components within normal limits  BRAIN NATRIURETIC PEPTIDE - Abnormal; Notable for the following components:   B Natriuretic Peptide 304.6 (*)    All other components within normal limits  CBC WITH DIFFERENTIAL/PLATELET  TROPONIN I    EKG EKG Interpretation  Date/Time:  Wednesday August 27 2018 06:03:50 EDT Ventricular Rate:  93 PR Interval:    QRS Duration: 137 QT Interval:  391 QTC Calculation: 487 R Axis:   13 Text Interpretation:  Sinus rhythm Left bundle branch block No significant change was found Confirmed by Ezequiel Essex [phone]) on 08/27/2018 6:07:07 AM Also confirmed by Ezequiel Essex [phone]  on 08/27/2018 6:09:41 AM   Radiology Dg Chest Portable 1 View  Result Date: 08/27/2018 CLINICAL DATA:  Chest pain and cough EXAM: PORTABLE CHEST 1 VIEW COMPARISON:  06/19/2018 FINDINGS:  Airspace disease on the left. Milder opacity at the right base with [employer]. No effusion or pneumothorax. Normal heart size. Lucent appearance of the apical lungs. IMPRESSION: Asymmetric airspace disease primarily concerning for pneumonia. Given [employer] and history of chest pain this may instead reflect acute pulmonary edema. Electronically Signed   By: Monte Fantasia M.D.   On: 08/27/2018 06:54    Procedures Procedures (including critical care time)  Medications Ordered in ED Medications  nitroGLYCERIN (NITROSTAT) SL tablet 0.4 mg (has no administration in time range)  furosemide (LASIX) injection 40 mg (40 mg Intravenous Given 08/27/18 0914)     Initial Impression / Assessment and Plan / ED Course  I have reviewed the triage vital signs and the nursing notes.  Pertinent labs & imaging results that were available during my care of the patient were reviewed by me and considered in my medical decision making (see chart for details).        Patient presenting for evaluation of shortness of breath, substernal chest pain, improving bilateral lower extremity edema.  He is afebrile, hypertensive in the ED.  SPO2 saturations as low as 88% on room air, placed on 2 L via nasal cannula.  Denies any chest pain on assessment.  Nontoxic in appearance.  Pain improved with nitroglycerin via EMS.  Chest x-ray shows asymmetric airspace disease worse on the left concerning for pneumonia versus pulmonary edema.  He has no infectious symptoms including fever or cough so I have a low suspicion of pneumonia versus influenza versus COVID-19.  EKG shows left bundle branch block, no significant changes from prior tracings.  Initial troponin is negative.  BNP mildly elevated at 304.6.  Remainder of labs reviewed by me shows some renal insufficiency but otherwise no metabolic derangements, no anemia, no leukocytosis.  Spoke with Dr. Virgina Jock, the patient's  cardiologist, who agrees to assume care of patient and  bring him into the hospital for further evaluation and management.  DAMION KANT was evaluated in Emergency Department on 08/27/2018 for the symptoms described in the history of present illness. He was evaluated in the context of the global COVID-19 pandemic, which necessitated consideration that the patient might be at risk for infection with the SARS-CoV-2 virus that causes COVID-19. Institutional protocols and algorithms that pertain to the evaluation of patients at risk for COVID-19 are in a state of rapid change based on information released by regulatory bodies including the CDC and federal and state organizations. These policies and algorithms were followed during the patient's care in the ED.   Final Clinical Impressions(s) / ED Diagnoses   Final diagnoses:  Substernal chest pain relieved by nitroglycerin    ED Discharge Orders    None       Renita Papa, PA-C 08/27/18 8473    Ezequiel Essex, MD 08/28/18 [phone]

## 2018-08-27 NOTE — ED Triage Notes (Addendum)
Patient awoke 1-1/2 ago with CP. States that he had started having intermittent SOB sine Friday/Saturday. Also c/o bilateral foot swelling. Was given 1 nitro and 324 asa.

## 2018-08-27 NOTE — H&P (Signed)
Charles Riggs is an 63 y.o. male.   Chief Complaint: Shortness of breath, chest pain HPI:   63 year old African-American male, 34 PY former smoker, OSA, LBBB, h/o asthma, severe AI, abnormal stress test.  Patient was last seen by me in February 2020.  I discussed the above findings with the patient.  While his stress test was high risk due to low EF, there was no large area of ischemia or infarction seen.  The small inferior fixed perfusion defect that was felt to be due to attenuation artifact.  His LV was nondilated, EF was normal on echocardiogram.  He did have severe aortic regurgitation with a central coaptation defect.  At that time, patient did not have any overt symptoms of shortness of breath or heart failure.  His main complaint at that time was only back pain and leg pain.  We have mutually decided to defer surgical management unless he has symptoms or has evidence of LV dilatation.  I started him on nifedipine 60 mg and Lasix 20 mg daily.  Patient presented to ED early this morning with retrosternal burning sensation and severe shortness of breath.  He reports that he had not been taking Lasix for last few days and had noticed increased leg swelling.  He started taking Lasix back only 2 days ago.  He called 911 and was water emergency department.  Work-up showed vascular congestion on chest x-ray concerning for pulmonary edema, elevated BNP at 300.  EKG showed stable left bundle branch block, troponin x1 was negative.  Patient was hypoxic in 80s.  His symptoms improved with IV Lasix 40 mg, but he continued to be tachypneic on ambulation.  He was thus admitted for observation.  Patient is currently asymptomatic at rest.  At baseline, exercises with weight training.  He has noticed shortness of breath with minimal aerobic activity that he does.  He also reports retrosternal chest burning sensation with exertion such as sexual intercourse.  Past Medical History:  Diagnosis Date  . Anxiety    . Asthma   . Nerve pain   . SOB (shortness of breath)     Past Surgical History:  Procedure Laterality Date  . HERNIA REPAIR      Family History  Problem Relation Age of Onset  . COPD Father    Social History:  reports that he has quit smoking. He has never used smokeless tobacco. He reports that he does not drink alcohol or use drugs.  Allergies: No Known Allergies  Review of Systems  Constitution: Negative for decreased appetite, malaise/fatigue, weight gain and weight loss.  HENT: Negative for congestion.   Eyes: Negative for visual disturbance.  Cardiovascular: Positive for chest pain and dyspnea on exertion. Negative for leg swelling, palpitations and syncope.  Respiratory: Negative for shortness of breath.   Endocrine: Negative for cold intolerance.  Hematologic/Lymphatic: Does not bruise/bleed easily.  Skin: Negative for itching and rash.  Musculoskeletal: Positive for back pain (With radiation to legs). Negative for myalgias.  Gastrointestinal: Negative for abdominal pain, nausea and vomiting.  Genitourinary: Negative for dysuria.  Neurological: Negative for dizziness and weakness.  Psychiatric/Behavioral: The patient is not nervous/anxious.   All other systems reviewed and are negative.    Blood pressure (!) 145/67, pulse 85, temperature 98.3 F (36.8 C), temperature source Oral, resp. rate 18, height 5\' 11"  (1.803 m), weight 115.4 kg, SpO2 96 %. Body mass index is 35.5 kg/m.  Physical Exam  Constitutional: He is oriented to person, place, and time.  He appears well-developed and well-nourished. No distress.  HENT:  Head: Normocephalic and atraumatic.  Eyes: Pupils are equal, round, and reactive to light. Conjunctivae are normal.  Neck: No JVD present.  Cardiovascular: Normal rate, regular rhythm and intact distal pulses.  Murmur heard. High-pitched blowing early diastolic murmur is present with a grade of 2/6 at the upper right sternal border radiating to  the apex. Pulmonary/Chest: Effort normal and breath sounds normal. He has no wheezes. He has no rales.  Abdominal: Soft. Bowel sounds are normal. There is no rebound.  Musculoskeletal:        General: Edema (1+ b/l) present.  Lymphadenopathy:    He has no cervical adenopathy.  Neurological: He is alert and oriented to person, place, and time. No cranial nerve deficit.  Skin: Skin is warm and dry.  Psychiatric: He has a normal mood and affect.  Nursing note and vitals reviewed.   Results for orders placed or performed during the hospital encounter of 08/27/18 (from the past 48 hour(s))  CBC with Differential/Platelet     Status: None   Collection Time: 08/27/18  6:04 AM  Result Value Ref Range   WBC 8.8 4.0 - 10.5 K/uL   RBC 4.89 4.22 - 5.81 MIL/uL   Hemoglobin 13.1 13.0 - 17.0 g/dL   HCT 41.9 39.0 - 52.0 %   MCV 85.7 80.0 - 100.0 fL   MCH 26.8 26.0 - 34.0 pg   MCHC 31.3 30.0 - 36.0 g/dL   RDW 13.2 11.5 - 15.5 %   Platelets 263 150 - 400 K/uL   nRBC 0.0 0.0 - 0.2 %   Neutrophils Relative % 75 %   Neutro Abs 6.6 1.7 - 7.7 K/uL   Lymphocytes Relative 19 %   Lymphs Abs 1.7 0.7 - 4.0 K/uL   Monocytes Relative 6 %   Monocytes Absolute 0.5 0.1 - 1.0 K/uL   Eosinophils Relative 0 %   Eosinophils Absolute 0.0 0.0 - 0.5 K/uL   Basophils Relative 0 %   Basophils Absolute 0.0 0.0 - 0.1 K/uL   Immature Granulocytes 0 %   Abs Immature Granulocytes 0.02 0.00 - 0.07 K/uL    Comment: Performed at [facility], [address], [place], [place] [address]  Basic metabolic panel     Status: Abnormal   Collection Time: 08/27/18  6:04 AM  Result Value Ref Range   Sodium 137 135 - 145 mmol/L   Potassium 4.6 3.5 - 5.1 mmol/L   Chloride 103 98 - 111 mmol/L   CO2 23 22 - 32 mmol/L   Glucose, Bld 141 (H) 70 - 99 mg/dL   BUN 10 8 - 23 mg/dL   Creatinine, Ser 1.31 (H) 0.61 - 1.24 mg/dL   Calcium 9.0 8.9 - 10.3 mg/dL   GFR calc non Af Amer 58 (L) >60 mL/min   GFR calc Af Amer >60 >60  mL/min   Anion gap 11 5 - 15    Comment: Performed at [facility], [place] [address] [place], [place] [address]  Troponin I - ONCE - STAT     Status: None   Collection Time: 08/27/18  6:04 AM  Result Value Ref Range   Troponin I <0.03 <0.03 ng/mL    Comment: Performed at [facility] [address], [place], [place] [address]  Brain natriuretic peptide     Status: Abnormal   Collection Time: 08/27/18  6:04 AM  Result Value Ref Range   B  Natriuretic Peptide 304.6 (H) 0.0 - 100.0 pg/mL    Comment: Performed at [facility], [place] [address], [place], [place] [address]    Labs:   Lab Results  Component Value Date   WBC 8.8 08/27/2018   HGB 13.1 08/27/2018   HCT 41.9 08/27/2018   MCV 85.7 08/27/2018   PLT 263 08/27/2018    Recent Labs  Lab 08/27/18 0604  NA 137  K 4.6  CL 103  CO2 23  BUN 10  CREATININE 1.31*  CALCIUM 9.0  GLUCOSE 141*    Lipid Panel  No results found for: CHOL, TRIG, HDL, CHOLHDL, VLDL, LDLCALC  BNP (last 3 results) Recent Labs    11/14/17 2110 08/27/18 0604  BNP 65.1 304.6*    HEMOGLOBIN A1C Lab Results  Component Value Date   HGBA1C 6.3 10/05/2008    Cardiac Panel (last 3 results) Recent Labs    11/14/17 2110 11/14/17 2359 08/27/18 0604  TROPONINI 0.03* <0.03 <0.03    Lab Results  Component Value Date   TROPONINI <0.03 08/27/2018     TSH No results for input(s): TSH in the last 8760 hours.   Medications Prior to Admission  Medication Sig Dispense Refill  . acetaminophen (TYLENOL) 500 MG tablet Take 1,000 mg by mouth daily as needed for mild pain or moderate pain.     Marland Kitchen albuterol (PROVENTIL HFA;VENTOLIN HFA) 108 (90 Base) MCG/ACT inhaler Inhale 2 puffs into the lungs every 6 (six) hours as needed for wheezing or shortness of breath.    Marland Kitchen albuterol (PROVENTIL) (2.5 MG/3ML) 0.083% nebulizer solution Take 3 mLs (2.5 mg total) by nebulization every 6 (six) hours as needed for wheezing or shortness of breath. 75 mL  12  . COMBIVENT RESPIMAT 20-100 MCG/ACT AERS respimat Inhale 2 puffs into the lungs daily as needed for shortness of breath. (Patient taking differently: Inhale 2 puffs into the lungs every 6 (six) hours as needed for shortness of breath. ) 1 Inhaler 0  . fluticasone (FLONASE) 50 MCG/ACT nasal spray Place 2 sprays into both nostrils daily as needed for allergies or rhinitis.    . furosemide (LASIX) 20 MG tablet Take 1 tablet (20 mg total) by mouth daily. 30 tablet 2  . gabapentin (NEURONTIN) 100 MG capsule Take 400 mg by mouth 3 (three) times daily.     Marland Kitchen guaiFENesin (MUCINEX) 600 MG 12 hr tablet Take 600 mg by mouth 2 (two) times daily as needed for cough.    . naproxen sodium (ALEVE) 220 MG tablet Take 440 mg by mouth 2 (two) times daily as needed (pain).    Marland Kitchen NIFEdipine (ADALAT CC) 30 MG 24 hr tablet Take 2 tablets (60 mg total) by mouth daily. 30 tablet 2      Current Facility-Administered Medications:  .  acetaminophen (TYLENOL) tablet 1,000 mg, 1,000 mg, Oral, Daily PRN, Faviola Klare J, MD .  albuterol (PROVENTIL) (2.5 MG/3ML) 0.083% nebulizer solution 3 mL, 3 mL, Inhalation, Q6H PRN, Laryssa Hassing J, MD .  fluticasone (FLONASE) 50 MCG/ACT nasal spray 2 spray, 2 spray, Each Nare, Daily PRN, Kaydra Borgen J, MD .  gabapentin (NEURONTIN) capsule 400 mg, 400 mg, Oral, TID, Neria Procter J, MD .  guaiFENesin (MUCINEX) 12 hr tablet 600 mg, 600 mg, Oral, BID PRN, Cochise Dinneen J, MD .  NIFEdipine (PROCARDIA XL/NIFEDICAL XL) 24 hr tablet 60 mg, 60 mg, Oral, Daily, Christophere Hillhouse J, MD .  nitroGLYCERIN (NITROSTAT) SL tablet 0.4 mg, 0.4 mg, Sublingual, Q5 min  PRN, Renita Papa, [place]   Today's Vitals   08/27/18 0730 08/27/18 0900 08/27/18 0940 08/27/18 0955  BP:  (!) 144/61  (!) 145/67  Pulse:  83  85  Resp:  18  18  Temp: 98.3 F (36.8 C)     TempSrc: Oral     SpO2:  96%  96%  Weight:    115.4 kg  Height:    5\' 11"  (1.803 m)  PainSc:   0-No pain 0-No pain    Body mass index is 35.5 kg/m.    Cardiovascular studies: EKG 08/27/2018: Sinus rhythm. LBBB (not new)  Echocardiogram 07/14/2018: Left ventricle cavity is normal in size. Moderate concentric hypertrophy of the left ventricle. Normal global wall motion. Unable to evaluate diastolic function due to severity of aortic regurgitation. Calculated EF 55%. Left atrial cavity is mildly dilated. Probably tricuspid valve with central coaptation defect. Severe aortic regurgitation. Mild to moderate mitral regurgitation. Mild tricuspid regurgitation.  No evidence of pulmonary hypertension.  Lexiscan myoview stress test 06/23/2018:  1. Lexiscan stress test was performed. Exercise capacity was notassessed. Stress symptoms included dizziness and headache. Resting blood pressure was 138/62 mmHg and peak effect blood pressure was 162/64 mmHg. The resting and stress electrocardiogram demonstrated normal sinus rhythm, LBBB and no resting arrhythmias. Stress EKG is non diagnostic for ischemia as it is a pharmacologic stress.  2. The overall quality of the study is good. Left ventricular cavity is noted to be enlarged on the rest and stress studies. Gated SPECT images reveal global decrease in myocardial thickening and wall motion. The left ventricular ejection fraction was calculated or visually estimated to be 38%. SPECT images reveal small sized, fixed, inferior perfusion defect. Findings likely represent dilated nonischemic cardiomyopathy.  3. High risk study.  Assessment/Plan  63 year old African-American male, 75 PY former smoker, OSA, LBBB, ? h/o asthma, severe AI, now admitted with chest pain and shortness of breath  Chest pain, shortness of breath: Suspect hypertensive urgency, symptoms exacerbated by underlying severe aortic regurgitation.  EKG shows left bundle branch block, which is not new.  Troponin x1 is negative.  My suspicion for myocardial infarction is low.  While his stress test  in 05/2018 was noted to be high risk due to low EF on stress test, echocardiogram had showed normal EF.  There was no evidence of large ischemia or infarction on stress test.    I will observe him overnight and diurese him with IV Lasix.  I emphasized importance of medication compliance.  I will also discuss with CT surgery regarding further management.  My suspicion is that this acute episode was triggered by medication noncompliance.  Ensuring that he is compliant with medication and diet, may be able to defer TEE and heart catheterization by 8 to 12 weeks, then proceed with surgical management when COVID outbreak is better controlled. Appreciate CT surgery input.   Nigel Mormon, MD 08/27/2018, 11:17 AM [facility] Pager: [phone] Office: [phone] If no answer: [phone]

## 2018-08-27 NOTE — Progress Notes (Signed)
Trop 0.04 noted. Will cycle serial enzymes to assess type 1 vs type 2 MI.  Nigel Mormon, MD [facility] Pager: [phone] Office: [phone] If no answer Cell [phone]

## 2018-08-27 NOTE — Progress Notes (Signed)
Patient refused CPAP for the night  

## 2018-08-27 NOTE — ED Notes (Signed)
ED TO INPATIENT HANDOFF REPORT  ED Nurse Name and Phone #:  Hassan Rowan [phone]   S Name/Age/Gender Charles Riggs 63 y.o. male Room/Bed: 016C/016C  Code Status   Code Status: Not on file  Home/SNF/Other Home Patient oriented to: self, place, time and situation Is this baseline? Yes   Triage Complete: Triage complete  Chief Complaint cp  Triage Note Patient awoke 1-1/2 ago with CP. States that he had started having intermittent SOB sine Friday/Saturday. Also c/o bilateral foot swelling. Was given 1 nitro and 324 asa.   Allergies No Known Allergies  Level of Care/Admitting Diagnosis ED Disposition    ED Disposition Condition Comment   Admit  Hospital Area: [facility] [[id number]]  Level of Care: Telemetry Cardiac [103]  Diagnosis: Chest pain in adult [[id number]]  Admitting Physician: Nigel Mormon [[id number]]  Attending Physician: Nigel Mormon [[id number]]  Estimated length of stay: 3 - 4 days  Certification:: I certify this patient will need inpatient services for at least 2 midnights  PT Class (Do Not Modify): Inpatient [101]  PT Acc Code (Do Not Modify): Private [1]       B Medical/Surgery History Past Medical History:  Diagnosis Date  . Anxiety   . Asthma   . Nerve pain   . SOB (shortness of breath)    Past Surgical History:  Procedure Laterality Date  . HERNIA REPAIR       A IV Location/Drains/Wounds Patient Lines/Drains/Airways Status   Active Line/Drains/Airways    Name:   Placement date:   Placement time:   Site:   Days:   Peripheral IV 08/27/18 Left Antecubital   08/27/18    0606    Antecubital   less than 1          Intake/Output Last 24 hours No intake or output data in the 24 hours ending 08/27/18 0915  Labs/Imaging Results for orders placed or performed during the hospital encounter of 08/27/18 (from the past 48 hour(s))  CBC with Differential/Platelet     Status: None   Collection Time: 08/27/18  6:04 AM  Result  Value Ref Range   WBC 8.8 4.0 - 10.5 K/uL   RBC 4.89 4.22 - 5.81 MIL/uL   Hemoglobin 13.1 13.0 - 17.0 g/dL   HCT 41.9 39.0 - 52.0 %   MCV 85.7 80.0 - 100.0 fL   MCH 26.8 26.0 - 34.0 pg   MCHC 31.3 30.0 - 36.0 g/dL   RDW 13.2 11.5 - 15.5 %   Platelets 263 150 - 400 K/uL   nRBC 0.0 0.0 - 0.2 %   Neutrophils Relative % 75 %   Neutro Abs 6.6 1.7 - 7.7 K/uL   Lymphocytes Relative 19 %   Lymphs Abs 1.7 0.7 - 4.0 K/uL   Monocytes Relative 6 %   Monocytes Absolute 0.5 0.1 - 1.0 K/uL   Eosinophils Relative 0 %   Eosinophils Absolute 0.0 0.0 - 0.5 K/uL   Basophils Relative 0 %   Basophils Absolute 0.0 0.0 - 0.1 K/uL   Immature Granulocytes 0 %   Abs Immature Granulocytes 0.02 0.00 - 0.07 K/uL    Comment: Performed at [facility], [address], [place], [place] [address]  Basic metabolic panel     Status: Abnormal   Collection Time: 08/27/18  6:04 AM  Result Value Ref Range   Sodium 137 135 - 145 mmol/L   Potassium 4.6 3.5 - 5.1 mmol/L   Chloride 103 98 -  111 mmol/L   CO2 23 22 - 32 mmol/L   Glucose, Bld 141 (H) 70 - 99 mg/dL   BUN 10 8 - 23 mg/dL   Creatinine, Ser 1.31 (H) 0.61 - 1.24 mg/dL   Calcium 9.0 8.9 - 10.3 mg/dL   GFR calc non Af Amer 58 (L) >60 mL/min   GFR calc Af Amer >60 >60 mL/min   Anion gap 11 5 - 15    Comment: Performed at [facility] [address] [place], [place] [address]  Troponin I - ONCE - STAT     Status: None   Collection Time: 08/27/18  6:04 AM  Result Value Ref Range   Troponin I <0.03 <0.03 ng/mL    Comment: Performed at [facility] [address], [place], [place] [address]  Brain natriuretic peptide     Status: Abnormal   Collection Time: 08/27/18  6:04 AM  Result Value Ref Range   B Natriuretic Peptide 304.6 (H) 0.0 - 100.0 pg/mL    Comment: Performed at [facility] [address], [place], [place] [address]   Dg Chest Portable 1 View  Result Date: 08/27/2018 CLINICAL DATA:  Chest pain and cough EXAM: PORTABLE  CHEST 1 VIEW COMPARISON:  06/19/2018 FINDINGS: Airspace disease on the left. Milder opacity at the right base with [employer]. No effusion or pneumothorax. Normal heart size. Lucent appearance of the apical lungs. IMPRESSION: Asymmetric airspace disease primarily concerning for pneumonia. Given [employer] and history of chest pain this may instead reflect acute pulmonary edema. Electronically Signed   By: Monte Fantasia M.D.   On: 08/27/2018 06:54    Pending Labs Unresulted Labs (From admission, onward)   None      Vitals/Pain Today's Vitals   08/27/18 0615 08/27/18 0638 08/27/18 0659 08/27/18 0700  BP: (!) 163/66 (!) 154/58  (!) 140/54  Pulse: 93 100  87  Resp: 18 15  18   Temp:      TempSrc:      SpO2: 93% 91% 94% 94%  Weight:      Height:      PainSc:   0-No pain     Isolation Precautions No active isolations  Medications Medications  nitroGLYCERIN (NITROSTAT) SL tablet 0.4 mg (has no administration in time range)  furosemide (LASIX) injection 40 mg (40 mg Intravenous Given 08/27/18 0914)    Mobility walks Low fall risk   Focused Assessments Pulmonary Assessment Handoff:  Lung sounds:   O2 Device: Nasal Cannula O2 Flow Rate (L/min): 2 L/min      R Recommendations: See Admitting Provider Note  Report given to:   Additional Notes:  Pt tolerating  ra without desats or sob presently.

## 2018-08-28 ENCOUNTER — Encounter (HOSPITAL_COMMUNITY): Payer: Self-pay | Admitting: Thoracic Surgery (Cardiothoracic Vascular Surgery)

## 2018-08-28 DIAGNOSIS — I351 Nonrheumatic aortic (valve) insufficiency: Secondary | ICD-10-CM

## 2018-08-28 DIAGNOSIS — G4733 Obstructive sleep apnea (adult) (pediatric): Secondary | ICD-10-CM | POA: Diagnosis present

## 2018-08-28 DIAGNOSIS — Q238 Other congenital malformations of aortic and mitral valves: Secondary | ICD-10-CM

## 2018-08-28 DIAGNOSIS — I5033 Acute on chronic diastolic (congestive) heart failure: Secondary | ICD-10-CM | POA: Diagnosis present

## 2018-08-28 DIAGNOSIS — I160 Hypertensive urgency: Secondary | ICD-10-CM

## 2018-08-28 DIAGNOSIS — R7989 Other specified abnormal findings of blood chemistry: Secondary | ICD-10-CM

## 2018-08-28 DIAGNOSIS — I10 Essential (primary) hypertension: Secondary | ICD-10-CM | POA: Diagnosis present

## 2018-08-28 LAB — LIPID PANEL
Cholesterol: 225 mg/dL — ABNORMAL HIGH (ref 0–200)
HDL: 78 mg/dL (ref >40)
LDL Cholesterol: 129 mg/dL — ABNORMAL HIGH (ref 0–99)
Total CHOL/HDL Ratio: 2.9 RATIO
Triglycerides: 90 mg/dL (ref <150)
VLDL: 18 mg/dL (ref 0–40)

## 2018-08-28 LAB — BASIC METABOLIC PANEL
Anion gap: 9 (ref 5–15)
BUN: 17 mg/dL (ref 8–23)
CO2: 29 mmol/L (ref 22–32)
Calcium: 9.1 mg/dL (ref 8.9–10.3)
Chloride: 102 mmol/L (ref 98–111)
Creatinine, Ser: 1.55 mg/dL — ABNORMAL HIGH (ref 0.61–1.24)
GFR calc Af Amer: 55 mL/min — ABNORMAL LOW (ref >60)
GFR calc non Af Amer: 48 mL/min — ABNORMAL LOW (ref >60)
Glucose, Bld: 100 mg/dL — ABNORMAL HIGH (ref 70–99)
Potassium: 4.1 mmol/L (ref 3.5–5.1)
Sodium: 140 mmol/L (ref 135–145)

## 2018-08-28 LAB — TROPONIN I: Troponin I: 0.05 ng/mL (ref <0.03)

## 2018-08-28 MED ORDER — ASPIRIN 81 MG PO CHEW
81.0000 mg | CHEWABLE_TABLET | Freq: Once | ORAL | Status: AC
  Administered 2018-08-28: 81 mg via ORAL
  Filled 2018-08-28: qty 1

## 2018-08-28 MED ORDER — AMLODIPINE BESYLATE 5 MG PO TABS: 5.0000 mg | ORAL_TABLET | Freq: Every day | ORAL | 2 refills | Status: DC

## 2018-08-28 MED ORDER — NITROGLYCERIN 0.4 MG SL SUBL: 0.4000 mg | SUBLINGUAL_TABLET | SUBLINGUAL | 2 refills | Status: DC | PRN

## 2018-08-28 MED ORDER — ROSUVASTATIN CALCIUM 5 MG PO TABS: 10.0000 mg | ORAL_TABLET | Freq: Every day | ORAL | Status: DC

## 2018-08-28 MED ORDER — FUROSEMIDE 40 MG PO TABS: 40.0000 mg | ORAL_TABLET | Freq: Every day | ORAL | 2 refills | Status: DC

## 2018-08-28 MED ORDER — ROSUVASTATIN CALCIUM 10 MG PO TABS: 10.0000 mg | ORAL_TABLET | Freq: Every day | ORAL | 2 refills | Status: DC

## 2018-08-28 MED ORDER — AMLODIPINE BESYLATE 5 MG PO TABS
5.0000 mg | ORAL_TABLET | Freq: Every day | ORAL | Status: DC
  Administered 2018-08-28: 5 mg via ORAL
  Filled 2018-08-28: qty 1

## 2018-08-28 MED FILL — ROSUVASTATIN CALCIUM 10 MG: 10 | 60 days supply | Qty: 60 | Fill #0 | Status: TO

## 2018-08-28 MED FILL — AMLODIPINE BESYLATE 5 MG TA: 5 | 60 days supply | Qty: 60 | Fill #0 | Status: TO

## 2018-08-28 MED FILL — NITROGLYCERIN 0.4 MG TAB SL: 0.4 | 8 days supply | Qty: 25 | Fill #0 | Status: TO

## 2018-08-28 MED FILL — FUROSEMIDE 40 MG TABLET: 40 | 60 days supply | Qty: 60 | Fill #0 | Status: TO

## 2018-08-28 NOTE — Progress Notes (Addendum)
[address]       [place], [address]             [phone]     CARDIOTHORACIC SURGERY PROGRESS NOTE  Subjective: No complaints.  Feels better.  Objective: Vital signs in last 24 hours: Temp:  [97.6 F (36.4 C)-98.4 F (36.9 C)] 98.4 F (36.9 C) (04/02 0514) Pulse Rate:  [75-81] 78 (04/02 0514) Cardiac Rhythm: Normal sinus rhythm (04/02 0758) Resp:  [19] 19 (04/01 2241) BP: (118-126)/(46-58) 123/53 (04/02 0514) SpO2:  [95 %-98 %] 98 % (04/02 0514) Weight:  [113.3 kg] 113.3 kg (04/02 0514)  Physical Exam:  Rhythm:   sinus  Breath sounds: clear  Heart sounds:  RRR  Incisions:  n/a  Abdomen:  soft  Extremities:  warm   Intake/Output from previous day: 04/01 0701 - 04/02 0700 In: -  Out: 3975 [Urine:3975] Intake/Output this shift: No intake/output data recorded.  Lab Results: Recent Labs    08/27/18 0604 08/27/18 1625  WBC 8.8 10.1  HGB 13.1 13.0  HCT 41.9 41.5  PLT 263 287   BMET:  Recent Labs    08/27/18 0604 08/27/18 1625 08/28/18 0815  NA 137  --  140  K 4.6  --  4.1  CL 103  --  102  CO2 23  --  29  GLUCOSE 141*  --  100*  BUN 10  --  17  CREATININE 1.31* 1.53* 1.55*  CALCIUM 9.0  --  9.1    Results for Charles, Riggs (MRN [id number]) as of 08/28/2018 10:18  Ref. Range 08/27/2018 11:39 08/27/2018 16:25 08/28/2018 08:15  Troponin I Latest Ref Range: <0.03 ng/mL 0.04 (HH) 0.06 (HH) 0.05 (HH)     CBG (last 3)  No results for input(s): GLUCAP in the last 72 hours. PT/INR:  No results for input(s): LABPROT, INR in the last 72 hours.  CTA Cardiac CTA  MEDICATIONS: Sub lingual nitro. 4mg  x 2 and lopressor 20mg  IV  TECHNIQUE: The patient was scanned on a Siemens 732 slice scanner. Gantry rotation speed was 250 msecs. Collimation was 0.6 mm. A 100 kV prospective scan was triggered in the ascending thoracic aorta at 35-75% of the R-R interval. Average HR during the scan was 80 bpm. The 3D data set was interpreted on a  dedicated work station using MPR, MIP and VRT modes. A total of 80cc of contrast was used.  FINDINGS: Difficult cardiac images, unable to appropriately lower heart rate in setting of severe aortic insufficiency, HR was upper 70s-80 at time of scan.  Non-cardiac: See separate report from [facility].  The pulmonary veins drained normally to the left atrium.  Aortic valve/thoracic aorta: The aortic valve does not have complete coaptation (consistent with severe aortic insufficiency). No dissection. Normal origin of the great vessels. The aortic root, ascending aorta/arch/descending thoracic aorta are not significantly dilated.  Aorta measurements:  Aortic root at sinuses of valsalva: 31 x 32 x 32 mm  Ascending aorta at greatest diameter: 35 mm  Arch: 31 mm  Descending thoracic aorta: 26 mm  Calcium Score: 37 Agatston units.  Coronary Arteries: Codominant with no anomalies  LM: No plaque or stenosis.  LAD system: Mixed plaque in the proximal LAD with mild (<50%) stenosis.  Circumflex system: Large vessel, mixed plaque in the proximal LCx without significant stenosis. Motion artifact affects the mid LCx but does not affect interpretation.  RCA system: Misregistration and motion artifact affect interpretation of the mid  to distal RCA. However, I suspect there is no plaque or stenosis.  IMPRESSION: 1.  No aortic dissection.  2. Inadequate coaptation of the aortic valve leaflets consistent with severe aortic insufficiency.  3. Coronary artery calcium score 37 Agatston units. This places the patient in the 50th percentile for age and gender, suggesting intermediate risk for future cardiac events.  4. Nonobstructive mild coronary disease. Interpretation of the RCA is somewhat limited by artifact but I think that there is no significant disease.  Dalton Mclean   Electronically Signed   By: Loralie Champagne M.D.   On: 08/28/2018 07:53      ECHOCARDIOGRAM LIMITED REPORT    Patient Name:   Charles Riggs Date of Exam: 08/27/2018 Medical Rec #:  [id number]      Height:       71.0 in Accession #:    [id number]     Weight:       254.5 lb Date of Birth:  07-19-1955     BSA:          2.34 m Patient Age:    63 years       BP:           126/58 mmHg Patient Gender: M              HR:           85 bpm. Exam Location:  Inpatient    Procedure: 2D Echo, Cardiac Doppler and Color Doppler  Indications:     AV insufficiency 424.10 / I35.9   History:         Patient has prior history of Echocardiogram examinations, most                  recent 07/23/2018. Diabetes, Asthma.   Sonographer:     Madelaine Etienne RDCS (AE) Referring Phys:  [id number] [name] Diagnosing Phys: Vernell Leep MD  IMPRESSIONS    1. The left ventricle has low normal systolic function, with an ejection fraction of 50-55%. The cavity size was mildly dilated.  2. The right ventricle has normal systolc function. The cavity was normal.  3. The asending aorta measures upper limit of normal at 3.7 cm. No dissection seen.  4. Trieleaflet aortic valve with mild calcification. Severe aortic valve regurgitation.  5. Compared to previous study in 06/2018, LV is now mildly dilated.  FINDINGS  Left Ventricle: The left ventricle has low normal systolic function, with an ejection fraction of 50-55%. The cavity size was mildly dilated.   Right Ventricle: The right ventricle has normal systolic function. The cavity was normal. Left Atrium: Left atrial size was normal in size. Left Atrial Appendage: Right Atrium: Right atrial size was normal in size. Pericardium: There is no evidence of pericardial effusion. Mitral Valve: The mitral valve is normal in structure. Tricuspid Valve: The tricuspid valve was normal in structure. Tricuspid valve regurgitation was not visualized by color flow Doppler. Aortic Valve: The aortic valve is tricuspid Mild  calcification of the aortic valve. Aortic valve regurgitation is severe by color flow Doppler. There is no evidence of aortic valve stenosis. Pulmonic Valve: The pulmonic valve was grossly normal. Aorta: The asending aorta measures upper limit of normal at 3.7 cm. No dissection seen. is normal in size and structure.   LEFT VENTRICLE PLAX 2D LVIDd:         5.40 cm LVIDs:         3.80 cm LV PW:  1.00 cm LV IVS:        0.90 cm LV SV:         79 ml LV SV Index:   32.45   LV Volumes (MOD) LV area d, A2C:    60.50 cm LV area d, A4C:    49.00 cm LV area s, A2C:    41.00 cm LV area s, A4C:    37.50 cm LV major d, A2C:   10.20 cm LV major d, A4C:   9.41 cm LV major s, A2C:   9.40 cm LV major s, A4C:   8.85 cm LV vol d, MOD A2C: 293.0 ml LV vol d, MOD A4C: 207.0 ml LV vol s, MOD A2C: 152.0 ml LV vol s, MOD A4C: 133.0 ml LV SV MOD A2C:     141.0 ml LV SV MOD A4C:     207.0 ml LV SV MOD BP:      108.9 ml  LEFT ATRIUM         Index LA diam:    3.00 cm 1.28 cm/m  AORTIC VALVE AV Vmax:           240.67 cm/s AV Vmean:          163.333 cm/s AV VTI:            0.451 m AV Peak Grad:      23.2 mmHg AV Mean Grad:      12.3 mmHg LVOT Vmax:         151.00 cm/s LVOT Vmean:        105.000 cm/s LVOT VTI:          0.305 m LVOT/AV VTI ratio: 0.68 AR PHT:            553 msec   AORTA Ao Root diam: 3.20 cm Ao Asc diam:  3.70 cm    SHUNTS Systemic VTI: 0.30 m    Vernell Leep MD Electronically signed by Vernell Leep MD Signature Date/Time: 08/27/2018/6:52:37 PM     Assessment/Plan: S/P   Patient has stage D severe symptomatic aortic insufficiency.  He was admitted to the hospital with acute exacerbation of chronic symptoms of shortness of breath and chest discomfort consistent with acute on chronic diastolic congestive heart failure.  EKGs demonstrate left bundle branch block.  Troponin levels are very slightly elevated and have remained flat.    I have  personally reviewed the patient's follow-up transthoracic echocardiogram and cardiac CT angiogram.  Echocardiogram confirmed the presence of severe aortic insufficiency.  There is moderate left ventricular hypertrophy with moderate left ventricular chamber enlargement.  There is mild to moderate global left ventricular systolic dysfunction.  Ejection fraction was calculated 42%.  There was mild to moderate mitral regurgitation.  There was no sign of significant pulmonary hypertension.  Cardiac gated CT angiogram of the heart demonstrates the presence of a quadricuspid aortic valve.  The aortic root and ascending aorta are not dilated.  There is no aortic dissection.  Coronary CT angiography was suboptimal but does not reveal any areas of significant suspicion, although the mid and distal right coronary artery was not well visualized.  There is no question the patient needs aortic valve replacement.  He will need diagnostic coronary angiography prior to surgery to make sure he does not have significant coronary artery disease.  Options include proceeding with surgery in the near future once the patient's renal function has stabilized versus delay until concerns related to the ongoing COVID-19 pandemic have subsided.  The  etiology of the patient's valvular heart disease is clearly congenital and by definition chronic.  I suspect that it makes most sense to send the patient home on medical therapy with close follow-up and postpone surgery until restrictions related to the COVID-19 pandemic have been lifted.  I have discussed matters at length with the patient at the bedside.  All of his questions have been addressed.  The patient should be encouraged to avoid strenuous exertion or heavy lifting.  The patient will follow-up closely with Dr. Virgina Jock.  We will make arrangements for the patient to return for elective surgical intervention in the future as soon as practical.  The patient should be referred to the  dental service for routine cleaning and preoperative clearance.   I spent in excess of 15 minutes during the conduct of this hospital encounter and >50% of this time involved direct face-to-face encounter with the patient for counseling and/or coordination of their care.   Rexene Alberts, MD 08/28/2018 9:59 AM

## 2018-08-28 NOTE — Discharge Instructions (Signed)
Aspirin and Your Heart  Aspirin is a medicine that prevents the cells in the blood that are used for clotting, called platelets, from sticking together. Aspirin can be used to help reduce the risk of blood clots, heart attacks, and other heart-related problems. Can I take aspirin? Your health care provider will help you determine whether it is safe and beneficial for you to take aspirin daily. Taking aspirin daily may be helpful if you:  Have had a heart attack or chest pain.  Are at risk for a heart attack.  Have undergone open-heart surgery, such as coronary artery bypass surgery (CABG).  Have had coronary angioplasty or a stent.  Have had certain types of stroke or transient ischemic attack (TIA).  Have peripheral artery disease (PAD).  Have chronic heart rhythm problems such as atrial fibrillation and cannot take an anticoagulant.  Have valve disease or have had surgery on a valve. What are the risks? Daily use of aspirin can cause side effects. Some of these include:  Bleeding. Bleeding problems can be minor or serious. An example of a minor problem is a cut that does not stop bleeding. An example of a more serious problem is stomach bleeding or, rarely, bleeding into the brain. Your risk of bleeding is increased if you are also taking non-steroidal anti-inflammatory drugs (NSAIDs).  Increased bruising.  Upset stomach.  An allergic reaction. People who have nasal polyps have an increased risk of developing an aspirin allergy. General guidelines  Take aspirin only as told by your health care provider. Make sure that you understand how much you should take and what form you should take. The two forms of aspirin are: ? Non-enteric-coated.This type of aspirin does not have a coating and is absorbed quickly. This type of aspirin also comes in a chewable form. ? Enteric-coated. This type of aspirin has a coating that releases the medicine very slowly. Enteric-coated aspirin might  cause less stomach upset than non-enteric-coated aspirin. This type of aspirin should not be chewed or crushed.  Limit alcohol intake to no more than 1 drink a day for nonpregnant women and 2 drinks a day for men. Drinking alcohol increases your risk of bleeding. One drink equals 12 oz of beer, 5 oz of wine, or 1 oz of hard liquor. Contact a health care provider if you:  Have unusual bleeding or bruising.  Have stomach pain or nausea.  Have ringing in your ears.  Have an allergic reaction that causes: ? Hives. ? Itchy skin. ? Swelling of the lips, tongue, or face. Get help right away if you:  Notice that your bowel movements are bloody, dark red, or black in color.  Vomit or cough up blood.  Have blood in your urine.  Cough, have noisy breathing (wheeze), or feel short of breath.  Have chest pain, especially if the pain spreads to the arms, back, neck, or jaw.  Have a severe headache, or a headache with confusion, or dizziness. These symptoms may represent a serious problem that is an emergency. Do not wait to see if the symptoms will go away. Get medical help right away. Call your local emergency services (911 in the [place] Do not drive yourself to the hospital. Summary  Aspirin can be used to help reduce the risk of blood clots, heart attacks, and other heart-related problems.  Daily use of aspirin can increase your risk of side effects. Your health care provider will help you determine whether it is safe and beneficial for you to  take aspirin daily.  Take aspirin only as told by your health care provider. Make sure that you understand how much you can take and what form you can take. This information is not intended to replace advice given to you by your health care provider. Make sure you discuss any questions you have with your health care provider. Document Released: 04/26/2008 Document Revised: 03/14/2017 Document Reviewed: 03/14/2017 Elsevier Interactive Patient  Education  2019 [employer].

## 2018-08-28 NOTE — TOC Transition Note (Signed)
Transition of Care [facility]) - CM/SW Discharge Note   Patient Details  Name: Charles Riggs MRN: [id number] Date of Birth: [birth year]  [facility] CM/SW Contact:  Bethena Roys, RN Phone Number: 08/28/2018, 10:09 AM   Clinical Narrative:   Pt presented for Chest Pain and SOB. PTA from home with spouse. Plan to transition home today. Medications will be delivered to room and wife to pay the co pay. CM did call [facility]- April Alexander to make them aware that patient has been hospitalized. Patient has asked that d/c summary be faxed to PCP-Dr [facility]. CSW is Kellie Simmering [phone] ext [phone].  No further needs from CM at this time.      Final next level of care: Home/Self Care Barriers to Discharge: No Barriers Identified     Choice offered to / list presented to : NA   Discharge Plan and Services   Discharge Planning Services: CM Consult, Medication Assistance

## 2018-08-29 DIAGNOSIS — R778 Other specified abnormalities of plasma proteins: Secondary | ICD-10-CM

## 2018-08-29 DIAGNOSIS — R7989 Other specified abnormal findings of blood chemistry: Secondary | ICD-10-CM

## 2018-08-29 NOTE — Discharge Summary (Signed)
Physician Discharge Summary  Patient ID: Charles Riggs MRN: [id number] DOB/AGE: 1955/10/26 63 y.o.  Admit date: 08/27/2018 Discharge date: 08/29/2018  Primary Discharge Diagnosis: Severe aortic regurgitation with quadricuspid aortic valve Hypertensive urgency Troponin elevation- type II MI OSA on CPAP  Secondary Discharge Diagnosis: Severe aortic regurgitation with quadricuspid aortic valve Hypertensive urgency Troponin elevation- type II MI OSA on CPAP   Hospital Course:   63 year old African-American male, 23 PY former smoker, OSA, LBBB, h/o asthma, severe AI.  Patient was admitted to [facility] 08/27/2018 with complaints of chest pain shortness of breath.  He was found to have significantly elevated blood pressure.  EKG showed no change from prior stroke.  Troponin was mildly elevated at 0.06 without any rise and fall to suggest type I MI.  I suspect that his acute presentation was secondary to recent medication noncompliance as he had stopped taking Lasix 2 weeks ago.  Given his severe aortic regurgitation, I have requested CT surgery consult.  Patient was seen by Dr. Roxy Manns.  Patient underwent CT angiogram coronary and aorta.  There was no dissection.  He had mild nonobstructive coronary artery disease, although visualization of mid to distal RCA was limited.  His troponin initially was thought to be type II MI in the setting of hypertensive urgency.  CTA showed that his aortic valve is quadricuspid. Patient was diuresed close to 4 L with IV Lasix.  He had significant improvement in his shortness of breath.  Chest pain resolved.  He was discharged with transition of care virtual visit with me set up for next week.  Plan will be to treat him medically for next few weeks until COVID outbreak is better controlled.  Nifedipine was switched to amlodipine. He was started on Aspirin and statin. He will eventually undergo aortic valve replacement surgery by Dr. Roxy Manns.    Discharge  Exam: Blood pressure (!) 123/53, pulse 78, temperature 98.4 F (36.9 C), temperature source Oral, resp. rate 19, height 5\' 11"  (1.803 m), weight 113.3 kg, SpO2 98 %.  Constitutional: He is oriented to person, place, and time. He appears well-developed and well-nourished. No distress.  HENT:  Head: Normocephalic and atraumatic.  Eyes: Pupils are equal, round, and reactive to light. Conjunctivae are normal.  Neck: No JVD present.  Cardiovascular: Normal rate, regular rhythm and intact distal pulses.  Murmur heard. High-pitched blowing early diastolic murmur is present with a grade of 2/6 at the upper right sternal border radiating to the apex. Pulmonary/Chest: Effort normal and breath sounds normal. He has no wheezes. He has no rales.  Abdominal: Soft. Bowel sounds are normal. There is no rebound.  Musculoskeletal:        General: No edema present.  Lymphadenopathy:    He has no cervical adenopathy.  Neurological: He is alert and oriented to person, place, and time. No cranial nerve deficit.  Skin: Skin is warm and dry.  Psychiatric: He has a normal mood and affect.  Nursing note and vitals reviewed.   Recommendations on discharge:   Medication and dietary compliance with low salt diet.    Significant Diagnostic Studies:  CTA coronary 08/27/2018: 1.  No aortic dissection. 2. Inadequate coaptation of the aortic valve leaflets consistent with severe aortic insufficiency. 3. Coronary artery calcium score 37 Agatston units. This places the patient in the 50th percentile for age and gender, suggesting intermediate risk for future cardiac events. 4. Nonobstructive mild coronary disease. Interpretation of the RCA is somewhat limited by artifact but I think  that there is no significant disease.  Cardiovascular studies: EKG 08/27/2018: Sinus rhythm. LBBB (not new)  Echocardiogram 07/14/2018: Left ventricle cavity is normal in size. Moderate concentric hypertrophy of the left  ventricle. Normal global wall motion. Unable to evaluate diastolic function due to severity of aortic regurgitation. Calculated EF 55%. Left atrial cavity is mildly dilated. Probably tricuspid valve with central coaptation defect. Severe aortic regurgitation. Mild to moderate mitral regurgitation. Mild tricuspid regurgitation.  No evidence of pulmonary hypertension.  Lexiscan myoview stress test 06/23/2018:  1. Lexiscan stress test was performed. Exercise capacity was notassessed. Stress symptoms included dizziness and headache. Resting blood pressure was 138/62 mmHg and peak effect blood pressure was 162/64 mmHg. The resting and stress electrocardiogram demonstrated normal sinus rhythm, LBBB and no resting arrhythmias. Stress EKG is non diagnostic for ischemia as it is a pharmacologic stress.  2. The overall quality of the study is good. Left ventricular cavity is noted to be enlarged on the rest and stress studies. Gated SPECT images reveal global decrease in myocardial thickening and wall motion. The left ventricular ejection fraction was calculated or visually estimated to be 38%. SPECT images reveal small sized, fixed, inferior perfusion defect. Findings likely represent dilated nonischemic cardiomyopathy.  3. High risk study. Labs:   Lab Results  Component Value Date   WBC 10.1 08/27/2018   HGB 13.0 08/27/2018   HCT 41.5 08/27/2018   MCV 84.0 08/27/2018   PLT 287 08/27/2018    Recent Labs  Lab 08/28/18 0815  NA 140  K 4.1  CL 102  CO2 29  BUN 17  CREATININE 1.55*  CALCIUM 9.1  GLUCOSE 100*    Lipid Panel     Component Value Date/Time   CHOL 225 (H) 08/28/2018 0815   TRIG 90 08/28/2018 0815   HDL 78 08/28/2018 0815   CHOLHDL 2.9 08/28/2018 0815   VLDL 18 08/28/2018 0815   LDLCALC 129 (H) 08/28/2018 0815    BNP (last 3 results) Recent Labs    11/14/17 2110 08/27/18 0604  BNP 65.1 304.6*    HEMOGLOBIN A1C Lab Results  Component Value Date   HGBA1C 6.3  10/05/2008    Cardiac Panel (last 3 results) Recent Labs    08/27/18 1139 08/27/18 1625 08/28/18 0815  TROPONINI 0.04* 0.06* 0.05*    Lab Results  Component Value Date   TROPONINI 0.05 (HH) 08/28/2018     TSH No results for input(s): TSH in the last 8760 hours.  Radiology: Ct Coronary Morph W/cta Cor W/score W/ca W/cm &/or Wo/cm  Addendum Date: 08/28/2018   ADDENDUM REPORT: 08/28/2018 07:53 CLINICAL DATA:  Chest pain EXAM: Cardiac CTA MEDICATIONS: Sub lingual nitro. 4mg  x 2 and lopressor 20mg  IV TECHNIQUE: The patient was scanned on a Siemens 161 slice scanner. Gantry rotation speed was 250 msecs. Collimation was 0.6 mm. A 100 kV prospective scan was triggered in the ascending thoracic aorta at 35-75% of the R-R interval. Average HR during the scan was 80 bpm. The 3D data set was interpreted on a dedicated work station using MPR, MIP and VRT modes. A total of 80cc of contrast was used. FINDINGS: Difficult cardiac images, unable to appropriately lower heart rate in setting of severe aortic insufficiency, HR was upper 70s-80 at time of scan. Non-cardiac: See separate report from [facility]. The pulmonary veins drained normally to the left atrium. Aortic valve/thoracic aorta: The aortic valve does not have complete coaptation (consistent with severe aortic insufficiency). No dissection. Normal origin of the great vessels.  The aortic root, ascending aorta/arch/descending thoracic aorta are not significantly dilated. Aorta measurements: Aortic root at sinuses of valsalva: 31 x 32 x 32 mm Ascending aorta at greatest diameter: 35 mm Arch: 31 mm Descending thoracic aorta: 26 mm Calcium Score: 37 Agatston units. Coronary Arteries: Codominant with no anomalies LM: No plaque or stenosis. LAD system: Mixed plaque in the proximal LAD with mild (<50%) stenosis. Circumflex system: Large vessel, mixed plaque in the proximal LCx without significant stenosis. Motion artifact affects the mid LCx but  does not affect interpretation. RCA system: Misregistration and motion artifact affect interpretation of the mid to distal RCA. However, I suspect there is no plaque or stenosis. IMPRESSION: 1.  No aortic dissection. 2. Inadequate coaptation of the aortic valve leaflets consistent with severe aortic insufficiency. 3. Coronary artery calcium score 37 Agatston units. This places the patient in the 50th percentile for age and gender, suggesting intermediate risk for future cardiac events. 4. Nonobstructive mild coronary disease. Interpretation of the RCA is somewhat limited by artifact but I think that there is no significant disease. Dalton Mclean Electronically Signed   By: Loralie Champagne M.D.   On: 08/28/2018 07:53   Result Date: 08/28/2018 EXAM: OVER-READ INTERPRETATION  CT CHEST The following report is an over-read performed by radiologist Dr. Suzy Bouchard of [facility], [place] on 08/28/2018. This over-read does not include interpretation of cardiac or coronary anatomy or pathology. The coronary calcium score/coronary CTA interpretation by the cardiologist is attached. COMPARISON:  Chest radiograph 08/27/2018 FINDINGS: Limited view of the lung parenchyma demonstrates mild airspace disease in the LEFT upper lobe suggesting edema. Airways are normal. Limited view of the mediastinum demonstrates no adenopathy. Esophagus normal. Limited view of the upper abdomen unremarkable. Limited view of the skeleton and chest wall is unremarkable. IMPRESSION: Very mild airspace disease in the LEFT upper lobe suggesting edema. Findings less conspicuous than comparison radiograph. Electronically Signed: By: Suzy Bouchard M.D. On: 08/28/2018 07:41   Dg Chest Portable 1 View  Result Date: 08/27/2018 CLINICAL DATA:  Chest pain and cough EXAM: PORTABLE CHEST 1 VIEW COMPARISON:  06/19/2018 FINDINGS: Airspace disease on the left. Milder opacity at the right base with [employer]. No effusion or pneumothorax. Normal heart  size. Lucent appearance of the apical lungs. IMPRESSION: Asymmetric airspace disease primarily concerning for pneumonia. Given [employer] and history of chest pain this may instead reflect acute pulmonary edema. Electronically Signed   By: Monte Fantasia M.D.   On: 08/27/2018 06:54      FOLLOW UP PLANS AND APPOINTMENTS Discharge Instructions    Diet - low sodium heart healthy   Complete by:  As directed    Increase activity slowly   Complete by:  As directed      Allergies as of 08/28/2018   No Known Allergies     Medication List    STOP taking these medications   naproxen sodium 220 MG tablet Commonly known as:  ALEVE   NIFEdipine 30 MG 24 hr tablet Commonly known as:  ADALAT CC     TAKE these medications   acetaminophen 500 MG tablet Commonly known as:  TYLENOL Take 1,000 mg by mouth daily as needed for mild pain or moderate pain.   albuterol 108 (90 Base) MCG/ACT inhaler Commonly known as:  PROVENTIL HFA;VENTOLIN HFA Inhale 2 puffs into the lungs every 6 (six) hours as needed for wheezing or shortness of breath.   albuterol (2.5 MG/3ML) 0.083% nebulizer solution Commonly known as:  PROVENTIL Take  3 mLs (2.5 mg total) by nebulization every 6 (six) hours as needed for wheezing or shortness of breath.   amLODipine 5 MG tablet Commonly known as:  NORVASC Take 1 tablet (5 mg total) by mouth daily.   azithromycin 250 MG tablet Commonly known as:  ZITHROMAX Take 250 mg by mouth daily. Stat dose 500mg  on 08-25-18   Breo Ellipta 100-25 MCG/INH Aepb Generic drug:  fluticasone furoate-vilanterol Inhale 1 puff into the lungs daily.   Combivent Respimat 20-100 MCG/ACT Aers respimat Generic drug:  Ipratropium-Albuterol Inhale 2 puffs into the lungs daily as needed for shortness of breath. What changed:  when to take this   fluticasone 50 MCG/ACT nasal spray Commonly known as:  FLONASE Place 2 sprays into both nostrils daily as needed for allergies or rhinitis.    furosemide 40 MG tablet Commonly known as:  Lasix Take 1 tablet (40 mg total) by mouth daily. What changed:    medication strength  how much to take   gabapentin 100 MG capsule Commonly known as:  NEURONTIN Take 400 mg by mouth 3 (three) times daily.   gabapentin 300 MG capsule Commonly known as:  NEURONTIN Take 300 mg by mouth 3 (three) times daily.   guaiFENesin 600 MG 12 hr tablet Commonly known as:  MUCINEX Take 600 mg by mouth 2 (two) times daily as needed for cough.   hydrOXYzine 25 MG capsule Commonly known as:  VISTARIL Take 25 mg by mouth 3 (three) times daily.   montelukast 10 MG tablet Commonly known as:  SINGULAIR Take 10 mg by mouth every evening.   nitroGLYCERIN 0.4 MG SL tablet Commonly known as:  NITROSTAT Place 1 tablet (0.4 mg total) under the tongue every 5 (five) minutes as needed for chest pain. Notes to patient:  Up to 3 tablets   oxyCODONE-acetaminophen 5-325 MG tablet Commonly known as:  PERCOCET/ROXICET Take 1 tablet by mouth every 12 (twelve) hours as needed for severe pain.   predniSONE 10 MG (21) Tbpk tablet Commonly known as:  STERAPRED UNI-PAK 21 TAB Take 10 mg by mouth daily. Started on 08-25-18 DS 6 days   rosuvastatin 10 MG tablet Commonly known as:  CRESTOR Take 1 tablet (10 mg total) by mouth daily at 6 PM.      Follow-up Information    , Reynold Bowen, MD Follow up on 09/04/2018.   Specialties:  Cardiology, Radiology Why:  09:30 AM Virtual visit (Office will contact you with details) Contact information: [place] [address] [phone]            Vernell Leep MD, [facility] Pager: [phone] Office: [phone] If no answer: [phone]

## 2018-09-03 NOTE — Progress Notes (Addendum)
Virtual Visit via Video Note   Subjective:   Charles Riggs, male    DOB: November 04, 1955, 63 y.o.   MRN: [id number]   I connected with the patient on 09/04/18 by a video enabled telemedicine application and verified that I am speaking with the correct person using two identifiers.     I discussed the limitations of evaluation and management by telemedicine and the availability of in person appointments. The patient expressed understanding and agreed to proceed.   This visit type was conducted due to national recommendations for restrictions regarding the COVID-19 Pandemic (e.g. social distancing).  This format is felt to be most appropriate for this patient at this time.  All issues noted in this document were discussed and addressed.  No physical exam was performed (except for noted visual exam findings with Tele health visits).  The patient has consented to conduct a Tele health visit and understands insurance will be billed.     Chief complaint:  Aortic regurgitation  HPI  63 year old African-American male, 55 PY former smoker, OSA, LBBB, h/o asthma, severe AI.  Patient was admitted to [facility] 08/27/2018 with complaints of chest pain shortness of breath.  He was found to have significantly elevated blood pressure.  EKG showed no change from prior stroke.  Troponin was mildly elevated at 0.06 without any rise and fall to suggest type I MI.  I suspected that his acute presentation was secondary to recent medication noncompliance as he had stopped taking Lasix 2 weeks prior.  Given his severe aortic regurgitation, I requested CT surgery consult.  Patient was seen by Dr. Roxy Manns.  Patient underwent CT angiogram coronary and aorta.  There was no dissection.  He had mild nonobstructive coronary artery disease, although visualization of mid to distal RCA was limited.  His troponin initially was thought to be type II MI in the setting of hypertensive urgency.  CTA showed that his aortic  valve is quadricuspid. Patient was diuresed close to 4 L with IV Lasix.  He had significant improvement in his shortness of breath.  Chest pain resolved.  He was discharged with transition of care virtual visit with me, happening today. Plan will be to treat him medically for next few weeks until COVID outbreak is better controlled.  Nifedipine was switched to amlodipine. He was started on Aspirin and statin. He will eventually undergo aortic valve replacement surgery by Dr. Roxy Manns.    Since his discharge, he has been feeling well. He has done "light exercise". He has not had any chest pain, shortness of breath symptoms. Leg edema has improved.   Past Medical History:  Diagnosis Date   Acute on chronic diastolic heart failure (HCC)    Anxiety    Aortic insufficiency    Essential hypertension    Nerve pain    Non-rheumatic aortic regurgitation    Obstructive sleep apnea    Quadricuspid aortic valve    Severe aortic insufficiency    SOB (shortness of breath)     Past Surgical History:  Procedure Laterality Date   HERNIA REPAIR      Social History   Socioeconomic History   Marital status: Single    Spouse name: Not on file   Number of children: Not on file   Years of education: Not on file   Highest education level: Not on file  Occupational History   Not on file  Social Needs   Financial resource strain: Not on file   Food insecurity:  Worry: Not on file    Inability: Not on file   Transportation needs:    Medical: Not on file    Non-medical: Not on file  Tobacco Use   Smoking status: Former Smoker   Smokeless tobacco: Never Used   Tobacco comment: 30 PY  Substance and Sexual Activity   Alcohol use: No   Drug use: No   Sexual activity: Not on file  Lifestyle   Physical activity:    Days per week: Not on file    Minutes per session: Not on file   Stress: Not on file  Relationships   Social connections:    Talks on phone: Not on file     Gets together: Not on file    Attends religious service: Not on file    Active member of club or organization: Not on file    Attends meetings of clubs or organizations: Not on file    Relationship status: Not on file   Intimate partner violence:    Fear of current or ex partner: Not on file    Emotionally abused: Not on file    Physically abused: Not on file    Forced sexual activity: Not on file  Other Topics Concern   Not on file  Social History Narrative   Not on file    Family History  Problem Relation Age of Onset   COPD Father    Cancer Brother     Current Outpatient Medications on File Prior to Visit  Medication Sig Dispense Refill   acetaminophen (TYLENOL) 500 MG tablet Take 1,000 mg by mouth daily as needed for mild pain or moderate pain.      albuterol (PROVENTIL HFA;VENTOLIN HFA) 108 (90 Base) MCG/ACT inhaler Inhale 2 puffs into the lungs every 6 (six) hours as needed for wheezing or shortness of breath.     albuterol (PROVENTIL) (2.5 MG/3ML) 0.083% nebulizer solution Take 3 mLs (2.5 mg total) by nebulization every 6 (six) hours as needed for wheezing or shortness of breath. 75 mL 12   amLODipine (NORVASC) 5 MG tablet Take 1 tablet (5 mg total) by mouth daily. 60 tablet 2   azithromycin (ZITHROMAX) 250 MG tablet Take 250 mg by mouth daily. Stat dose 500mg  on 08-25-18     COMBIVENT RESPIMAT 20-100 MCG/ACT AERS respimat Inhale 2 puffs into the lungs daily as needed for shortness of breath. (Patient taking differently: Inhale 2 puffs into the lungs every 6 (six) hours as needed for shortness of breath. ) 1 Inhaler 0   fluticasone (FLONASE) 50 MCG/ACT nasal spray Place 2 sprays into both nostrils daily as needed for allergies or rhinitis.     fluticasone furoate-vilanterol (BREO ELLIPTA) 100-25 MCG/INH AEPB Inhale 1 puff into the lungs daily.     furosemide (LASIX) 40 MG tablet Take 1 tablet (40 mg total) by mouth daily. 60 tablet 2   gabapentin (NEURONTIN)  300 MG capsule Take 300 mg by mouth 3 (three) times daily.     guaiFENesin (MUCINEX) 600 MG 12 hr tablet Take 600 mg by mouth 2 (two) times daily as needed for cough.     hydrOXYzine (VISTARIL) 25 MG capsule Take 25 mg by mouth 3 (three) times daily.     montelukast (SINGULAIR) 10 MG tablet Take 10 mg by mouth every evening.     nitroGLYCERIN (NITROSTAT) 0.4 MG SL tablet Place 1 tablet (0.4 mg total) under the tongue every 5 (five) minutes as needed for chest pain. 30 tablet 2  oxyCODONE-acetaminophen (PERCOCET/ROXICET) 5-325 MG tablet Take 1 tablet by mouth every 12 (twelve) hours as needed for severe pain.     predniSONE (STERAPRED UNI-PAK 21 TAB) 10 MG (21) TBPK tablet Take 10 mg by mouth daily. Started on 08-25-18 DS 6 days     rosuvastatin (CRESTOR) 10 MG tablet Take 1 tablet (10 mg total) by mouth daily at 6 PM. 60 tablet 2   No current facility-administered medications on file prior to visit.     Cardiovascular studies:  CTA coronary 08/27/2018: 1. No aortic dissection. 2. Inadequate coaptation of the aortic valve leaflets consistent with severe aortic insufficiency. 3. Coronary artery calcium score 37 Agatston units. This places the patient in the 50th percentile for age and gender, suggesting intermediate risk for future cardiac events. 4. Nonobstructive mild coronary disease. Interpretation of the RCA is somewhat limited by artifact but I think that there is no significant disease.  Cardiovascular studies: EKG 08/27/2018: Sinus rhythm. LBBB (not new)  Echocardiogram 07/14/2018: Left ventricle cavity is normal in size. Moderate concentric hypertrophy of the left ventricle. Normal global wall motion. Unable to evaluate diastolic function due to severity of aortic regurgitation. Calculated EF 55%. Left atrial cavity is mildly dilated. Probably tricuspid valve with central coaptation defect. Severe aortic regurgitation. Mild to moderate mitral regurgitation. Mild  tricuspid regurgitation.  No evidence of pulmonary hypertension.  Lexiscan myoview stress test 06/23/2018:  1. Lexiscan stress test was performed. Exercise capacity was notassessed. Stress symptoms included dizziness and headache. Resting blood pressure was 138/62 mmHg and peak effect blood pressure was 162/64 mmHg. The resting and stress electrocardiogram demonstrated normal sinus rhythm, LBBB and no resting arrhythmias. Stress EKG is non diagnostic for ischemia as it is a pharmacologic stress.  2. The overall quality of the study is good. Left ventricular cavity is noted to be enlarged on the rest and stress studies. Gated SPECT images reveal global decrease in myocardial thickening and wall motion. The left ventricular ejection fraction was calculated or visually estimated to be 38%. SPECT images reveal small sized, fixed, inferior perfusion defect. Findings likely represent dilated nonischemic cardiomyopathy.  3. High risk study.  Recent labs: Results for LARNELL, GRANLUND (MRN [id number]) as of 09/04/2018 08:27  Ref. Range 08/27/2018 06:04 08/27/2018 11:39 08/27/2018 16:25 08/28/2018 86:76  BASIC METABOLIC PANEL Unknown Rpt (A)   Rpt (A)  Sodium Latest Ref Range: 135 - 145 mmol/L 137   140  Potassium Latest Ref Range: 3.5 - 5.1 mmol/L 4.6   4.1  Chloride Latest Ref Range: 98 - 111 mmol/L 103   102  CO2 Latest Ref Range: 22 - 32 mmol/L 23   29  Glucose Latest Ref Range: 70 - 99 mg/dL 141 (H)   100 (H)  BUN Latest Ref Range: 8 - 23 mg/dL 10   17  Creatinine Latest Ref Range: 0.61 - 1.24 mg/dL 1.31 (H)  1.53 (H) 1.55 (H)  Calcium Latest Ref Range: 8.9 - 10.3 mg/dL 9.0   9.1  Anion gap Latest Ref Range: 5 - 15  11   9   GFR, Est Non African American Latest Ref Range: >60 mL/min 58 (L)  48 (L) 48 (L)  GFR, Est African American Latest Ref Range: >60 mL/min >60  56 (L) 55 (L)  B Natriuretic Peptide Latest Ref Range: 0.0 - 100.0 pg/mL 304.6 (H)     Troponin I Latest Ref Range: <0.03 ng/mL <0.03 0.04  (HH) 0.06 (HH) 0.05 (HH)  Total CHOL/HDL Ratio Latest Units: RATIO  2.9  Cholesterol Latest Ref Range: 0 - 200 mg/dL    225 (H)  HDL Cholesterol Latest Ref Range: >40 mg/dL    78  LDL (calc) Latest Ref Range: 0 - 99 mg/dL    129 (H)  Triglycerides Latest Ref Range: <150 mg/dL    90  VLDL Latest Ref Range: 0 - 40 mg/dL    18   Results for JOMEL, WHITTLESEY (MRN [id number]) as of 09/04/2018 08:27  Ref. Range 08/27/2018 16:25  WBC Latest Ref Range: 4.0 - 10.5 K/uL 10.1  RBC Latest Ref Range: 4.22 - 5.81 MIL/uL 4.94  Hemoglobin Latest Ref Range: 13.0 - 17.0 g/dL 13.0  HCT Latest Ref Range: 39.0 - 52.0 % 41.5  MCV Latest Ref Range: 80.0 - 100.0 fL 84.0  MCH Latest Ref Range: 26.0 - 34.0 pg 26.3  MCHC Latest Ref Range: 30.0 - 36.0 g/dL 31.3  RDW Latest Ref Range: 11.5 - 15.5 % 13.3  Platelets Latest Ref Range: 150 - 400 K/uL 287  nRBC Latest Ref Range: 0.0 - 0.2 % 0.0    Review of Systems  Constitution: Negative for decreased appetite, malaise/fatigue, weight gain and weight loss.  HENT: Negative for congestion.   Eyes: Negative for visual disturbance.  Cardiovascular: Positive for leg swelling. Negative for chest pain, dyspnea on exertion, palpitations and syncope.  Respiratory: Negative for shortness of breath.   Endocrine: Negative for cold intolerance.  Hematologic/Lymphatic: Does not bruise/bleed easily.  Skin: Negative for itching and rash.  Musculoskeletal: Negative for myalgias.  Gastrointestinal: Negative for abdominal pain, nausea and vomiting.  Genitourinary: Negative for dysuria.  Neurological: Negative for dizziness and weakness.  Psychiatric/Behavioral: The patient is not nervous/anxious.   All other systems reviewed and are negative.       Vitals:   09/04/18 0900 09/04/18 0905  BP: (!) 150/54 (!) 144/58  Pulse: 83 85   (Measured by the patient using a home BP monitor)   Observation/findings during video visit   Objective:   Physical Exam  Constitutional: He is  oriented to person, place, and time. He appears well-developed and well-nourished. No distress.  Pulmonary/Chest: Effort normal.  Musculoskeletal:        General: Edema (Trace b/l) present.  Neurological: He is alert and oriented to person, place, and time.  Psychiatric: He has a normal mood and affect.  Nursing note and vitals reviewed.         Assessment & Recommendations:   63 year old African-American male, 35 PY former smoker, OSA, LBBB, h/o asthma, severe AI with quadricuspid aortic valve.  Severe aortic regurgitation: Quadricuspid aortic valve Increase amlodipine to 10 mg daily. Increase lasix to 40 mg in am, 20 mg in pm. Continue low salt diet Avoid heavy exertion.  Plan for surgery in 8-12 weeks, once COVID outbreak settles down.  Hypertension: As above  Nonobstructive CAD: Continue Aspirin 81 mg, crestor 10 mg daily.   Follow up virtual visit in 4 weeks.   Nigel Mormon, MD [facility] Pager: [phone] Office: [phone] If no answer Cell [phone]

## 2018-09-04 ENCOUNTER — Other Ambulatory Visit: Payer: Self-pay

## 2018-09-04 ENCOUNTER — Ambulatory Visit (INDEPENDENT_AMBULATORY_CARE_PROVIDER_SITE_OTHER): Payer: No Typology Code available for payment source | Admitting: Cardiology

## 2018-09-04 ENCOUNTER — Encounter: Payer: Self-pay | Admitting: Cardiology

## 2018-09-04 VITALS — BP 144/58 | HR 85 | Ht 71.0 in | Wt 254.0 lb

## 2018-09-04 DIAGNOSIS — I10 Essential (primary) hypertension: Secondary | ICD-10-CM

## 2018-09-04 DIAGNOSIS — I2510 Atherosclerotic heart disease of native coronary artery without angina pectoris: Secondary | ICD-10-CM

## 2018-09-04 DIAGNOSIS — I351 Nonrheumatic aortic (valve) insufficiency: Secondary | ICD-10-CM

## 2018-09-04 MED ORDER — FUROSEMIDE 20 MG PO TABS: ORAL_TABLET | ORAL | 3 refills | Status: DC

## 2018-09-04 MED ORDER — AMLODIPINE BESYLATE 10 MG PO TABS: 10.0000 mg | ORAL_TABLET | Freq: Every day | ORAL | 3 refills | Status: DC

## 2018-09-08 ENCOUNTER — Ambulatory Visit: Payer: Non-veteran care | Admitting: Cardiology

## 2018-09-19 ENCOUNTER — Emergency Department (HOSPITAL_COMMUNITY)
Admission: EM | Admit: 2018-09-19 | Discharge: 2018-09-19 | Disposition: A | Discharge status: Home / Self Care | Payer: No Typology Code available for payment source | Attending: Emergency Medicine | Admitting: Emergency Medicine

## 2018-09-19 ENCOUNTER — Encounter (HOSPITAL_COMMUNITY): Payer: Self-pay | Admitting: Emergency Medicine

## 2018-09-19 ENCOUNTER — Other Ambulatory Visit: Payer: Self-pay

## 2018-09-19 DIAGNOSIS — Z87891 Personal history of nicotine dependence: Secondary | ICD-10-CM | POA: Diagnosis not present

## 2018-09-19 DIAGNOSIS — Z79899 Other long term (current) drug therapy: Secondary | ICD-10-CM | POA: Diagnosis not present

## 2018-09-19 DIAGNOSIS — E119 Type 2 diabetes mellitus without complications: Secondary | ICD-10-CM | POA: Insufficient documentation

## 2018-09-19 DIAGNOSIS — I110 Hypertensive heart disease with heart failure: Secondary | ICD-10-CM | POA: Insufficient documentation

## 2018-09-19 DIAGNOSIS — M25551 Pain in right hip: Secondary | ICD-10-CM

## 2018-09-19 DIAGNOSIS — I5032 Chronic diastolic (congestive) heart failure: Secondary | ICD-10-CM | POA: Insufficient documentation

## 2018-09-19 DIAGNOSIS — M5432 Sciatica, left side: Secondary | ICD-10-CM | POA: Insufficient documentation

## 2018-09-19 DIAGNOSIS — J45909 Unspecified asthma, uncomplicated: Secondary | ICD-10-CM | POA: Insufficient documentation

## 2018-09-19 DIAGNOSIS — M25552 Pain in left hip: Secondary | ICD-10-CM

## 2018-09-19 DIAGNOSIS — M5431 Sciatica, right side: Secondary | ICD-10-CM | POA: Diagnosis not present

## 2018-09-19 MED ORDER — PREDNISONE 20 MG PO TABS: ORAL_TABLET | ORAL | 0 refills | Status: DC

## 2018-09-19 MED ORDER — DEXAMETHASONE SODIUM PHOSPHATE 10 MG/ML IJ SOLN
10.0000 mg | Freq: Once | INTRAMUSCULAR | Status: AC
  Administered 2018-09-19: 10 mg via INTRAMUSCULAR
  Filled 2018-09-19: qty 1

## 2018-09-19 NOTE — ED Provider Notes (Signed)
[facility] Provider Note   CSN: [id number] Arrival date & time: 09/19/18  1306    History   Chief Complaint Chief Complaint  Patient presents with  . Hip Pain  . Leg Pain    HPI    [name] is a 63 y.o. male with a PMHx of CHF, aortic insufficiency, HTN, nerve pain, CAD, and other conditions listed below, who presents to the ED with complaints of bilateral hip/buttock pain x3 months.  He describes the pain as 10/10 constant achy "tired" throbbing pain in the buttock worse on the right, radiating down his lateral legs down into his knees and ankles, worse with sitting or walking for prolonged time, and unrelieved with gabapentin, Tylenol, and oxycodone which is been prescribed by his PCP.  He reports having paresthesias in his lower legs on the lateral aspect, worse on the right.  He had an e-visit and they sent him for xray imaging of his knees but he did not actually have a physical exam done.  Denies any injury, falls, or trauma.  Denies any heavy lifting or bending.  He denies having any lower back pain, any recent fevers or chills, chest pain, shortness breath, abdominal pain, nausea, vomiting, diarrhea, constipation, dysuria, hematuria, incontinence of urine or stool, saddle anesthesia or cauda equina symptoms, numbness, focal weakness, or any other complaints at this time.  Of note, chart review reveals that he was seen on 03/31/2018 for similar complaints, had lumbar imaging which showed diffuse degenerative changes but no acute findings, and right hip x-ray showed minimal degenerative changes of the right hip but no acute findings.  He was diagnosed with sciatica but left before his evaluation was complete.  Patient denies ever being told that he had sciatica.  The history is provided by the patient and medical records. No language interpreter was used.  Hip Pain  Pertinent negatives include no chest pain, no abdominal pain and no shortness of  breath.  Leg Pain  Associated symptoms: no back pain and no fever     Past Medical History:  Diagnosis Date  . Acute on chronic diastolic heart failure ([place])   . Anxiety   . Aortic insufficiency   . Essential hypertension   . Nerve pain   . Non-rheumatic aortic regurgitation   . Obstructive sleep apnea   . Quadricuspid aortic valve   . Severe aortic insufficiency   . SOB (shortness of breath)     Patient Active Problem List   Diagnosis Date Noted  . Coronary artery disease involving native coronary artery of native heart without angina pectoris 09/04/2018  . Elevated troponin 08/29/2018  . Severe aortic insufficiency   . Acute on chronic diastolic heart failure ([place])   . Nonrheumatic aortic valve insufficiency   . Essential hypertension   . Obstructive sleep apnea   . Quadricuspid aortic valve   . Chest pain in adult 08/27/2018  . LEG PAIN 04/26/2010  . DEPRESSION 08/19/2009  . RENAL CALCULUS 01/05/2009  . DM 10/12/2008  . COCAINE ABUSE, HX OF 06/16/2007  . ASTHMA 03/24/2007  . GERD 03/24/2007    Past Surgical History:  Procedure Laterality Date  . HERNIA REPAIR          Home Medications    Prior to Admission medications   Medication Sig Start Date End Date Taking? Authorizing Provider  acetaminophen (TYLENOL) 500 MG tablet Take 1,000 mg by mouth daily as needed for mild pain or moderate pain.  [provider]  albuterol (PROVENTIL HFA;VENTOLIN HFA) 108 (90 Base) MCG/ACT inhaler Inhale 2 puffs into the lungs every 6 (six) hours as needed for wheezing or shortness of breath.    [provider]  albuterol (PROVENTIL) (2.5 MG/3ML) 0.083% nebulizer solution Take 3 mLs (2.5 mg total) by nebulization every 6 (six) hours as needed for wheezing or shortness of breath. 11/14/17   Noemi Chapel, MD  amLODipine (NORVASC) 10 MG tablet Take 1 tablet (10 mg total) by mouth daily. 09/04/18   [name], Reynold Bowen, MD  azithromycin (ZITHROMAX) 250 MG tablet  Take 250 mg by mouth daily. Stat dose 500mg  on 08-25-18    [provider]  COMBIVENT RESPIMAT 20-100 MCG/ACT AERS respimat Inhale 2 puffs into the lungs daily as needed for shortness of breath. Patient taking differently: Inhale 2 puffs into the lungs every 6 (six) hours as needed for shortness of breath.  12/10/16   Law, Bea Graff, PA-C  fluticasone (FLONASE) 50 MCG/ACT nasal spray Place 2 sprays into both nostrils daily as needed for allergies or rhinitis.    [provider]  fluticasone furoate-vilanterol (BREO ELLIPTA) 100-25 MCG/INH AEPB Inhale 1 puff into the lungs daily.    [provider]  furosemide (LASIX) 20 MG tablet 2 in the morning, 1 in the afternoon 09/04/18   [name], Manish J, MD  gabapentin (NEURONTIN) 300 MG capsule Take 300 mg by mouth 3 (three) times daily.    [provider]  guaiFENesin (MUCINEX) 600 MG 12 hr tablet Take 600 mg by mouth 2 (two) times daily as needed for cough.    [provider]  hydrOXYzine (VISTARIL) 25 MG capsule Take 25 mg by mouth 3 (three) times daily.    [provider]  montelukast (SINGULAIR) 10 MG tablet Take 10 mg by mouth every evening.    [provider]  nitroGLYCERIN (NITROSTAT) 0.4 MG SL tablet Place 1 tablet (0.4 mg total) under the tongue every 5 (five) minutes as needed for chest pain. 08/28/18   [name], Reynold Bowen, MD  oxyCODONE-acetaminophen (PERCOCET/ROXICET) 5-325 MG tablet Take 1 tablet by mouth every 12 (twelve) hours as needed for severe pain.    [provider]  predniSONE (STERAPRED UNI-PAK 21 TAB) 10 MG (21) TBPK tablet Take 10 mg by mouth daily. Started on 08-25-18 DS 6 days    [provider]  rosuvastatin (CRESTOR) 10 MG tablet Take 1 tablet (10 mg total) by mouth daily at 6 PM. 08/28/18   [name], Reynold Bowen, MD    Family History Family History  Problem Relation Age of Onset  . COPD Father   . Cancer Brother     Social History Social  History   Tobacco Use  . Smoking status: Former Research scientist (life sciences)  . Smokeless tobacco: Never Used  . Tobacco comment: 30 PY  Substance Use Topics  . Alcohol use: No  . Drug use: No     Allergies   Patient has no known allergies.   Review of Systems Review of Systems  Constitutional: Negative for chills and fever.  Respiratory: Negative for shortness of breath.   Cardiovascular: Negative for chest pain.  Gastrointestinal: Negative for abdominal pain, constipation, diarrhea, nausea and vomiting.  Genitourinary: Negative for difficulty urinating (no incontinence), dysuria and hematuria.  Musculoskeletal: Positive for arthralgias and myalgias. Negative for back pain.  Skin: Negative for color change.  Allergic/Immunologic: Negative for immunocompromised state.  Neurological: Positive for numbness (paresthesias lateral lower legs bilaterally ). Negative for weakness.  Psychiatric/Behavioral: Negative for confusion.   All other systems reviewed and are negative for acute change except as noted in the HPI.    Physical Exam Updated Vital Signs BP 134/75 (BP Location: Right Arm)   Pulse 93   Temp 98.3 F (36.8 C) (Oral)   Resp 18   Ht 5\' 11"  (1.803 m)   Wt 116.6 kg   SpO2 97%   BMI 35.84 kg/m   Physical Exam Vitals signs and nursing note reviewed.  Constitutional:      General: He is not in acute distress.    Appearance: Normal appearance. He is well-developed. He is not toxic-appearing.     Comments: Afebrile, nontoxic, NAD  HENT:     Head: Normocephalic and atraumatic.  Eyes:     General:        Right eye: No discharge.        Left eye: No discharge.     Conjunctiva/sclera: Conjunctivae normal.  Neck:     Musculoskeletal: Normal range of motion and neck supple.  Cardiovascular:     Rate and Rhythm: Normal rate.     Pulses: Normal pulses.  Pulmonary:     Effort: Pulmonary effort is normal. No respiratory distress.  Abdominal:     General: There is no distension.   Musculoskeletal: Normal range of motion.     Right hip: Normal.     Left hip: Normal.     Right knee: Normal.     Left knee: Normal.     Right ankle: Normal.     Left ankle: Normal.     Cervical back: Normal.     Thoracic back: Normal.     Lumbar back: He exhibits tenderness. He exhibits normal range of motion, no bony tenderness and no spasm.       Back:     Comments: All spinal levels with FROM intact without midline spinous process TTP, no bony stepoffs or deformities, mild TTP to b/l SI joints/mid-buttock regions worse on the R, no paraspinous muscle TTP or muscle spasms. No tenderness to hip joint lines, thighs, knee joint lines, calves, or ankles. +SLR bilaterally. No overlying skin changes to back or legs. Strength and sensation grossly intact in all extremities, gait steady and nonantalgic. Distal pulses intact. Soft compartments in BLEs.   Skin:    General: Skin is warm and dry.     Findings: No rash.  Neurological:     Mental Status: He is alert and oriented to person, place, and time.     Sensory: Sensation is intact. No sensory deficit.     Motor: Motor function is intact.  Psychiatric:        Mood and Affect: Mood and affect normal.        Behavior: Behavior normal.      ED Treatments / Results  Labs (all labs ordered are listed, but only abnormal results are displayed) Labs Reviewed - No data to display  EKG None  Radiology No results found.   R hip xray 03/31/18: Study Result  CLINICAL DATA:  Pain in the right hip  EXAM: DG HIP (WITH OR WITHOUT PELVIS) 2-3V RIGHT  COMPARISON:  CT 10/19/2017  FINDINGS: Minimal SI joint degenerative change. Pubic symphysis and rami are intact. Moderate arthritis of the left hip. No fracture or malalignment. Joint space relatively maintained. Acetabular osteophyte versus small loose body adjacent to the femoral head laterally.  IMPRESSION: 1. No acute osseous abnormality. 2. Minimal degenerative change of the  right hip.  Electronically Signed   By: Donavan Foil M.D.   On: 03/31/2018 20:26     Lumbar Xray 03/31/18: Study Result  CLINICAL DATA:  Pain in the hip  EXAM: LUMBAR SPINE - COMPLETE 4+ VIEW  COMPARISON:  CT 10/19/2016  FINDINGS: Lumbar alignment is within normal limits. Vertebral body heights are maintained. Mild diffuse degenerative changes throughout the lumbar spine with disc space narrowing and anterior osteophytes. Facet degenerative change of the lower lumbar spine. Multiple bilateral kidney stones, measuring up to 7 mm on the left and 6 mm on the right.  IMPRESSION: 1. Diffuse degenerative changes without acute osseous abnormality. 2. Multiple bilateral kidney stones   Electronically Signed   By: Donavan Foil M.D.   On: 03/31/2018 20:24     Procedures Procedures (including critical care time)  Medications Ordered in ED Medications  dexamethasone (DECADRON) injection 10 mg (10 mg Intramuscular Given 09/19/18 1730)     Initial Impression / Assessment and Plan / ED Course  I have reviewed the triage vital signs and the nursing notes.  Pertinent labs & imaging results that were available during my care of the patient were reviewed by me and considered in my medical decision making (see chart for details).        63 y.o. male here with bilateral hip/buttock pain for the last 3 months.  On exam, mild tenderness to the SI joint/midline buttock area bilaterally, worse on the right, no hip joint line tenderness, no tenderness to the thigh or knee, no tenderness to the calf or ankle, +SLR bilaterally, extremities NVI with soft compartments, no swelling. No midline spinal tenderness. Gait steady. No red flag s/sx of back pain, no s/sx of cauda equina. He was seen in 03/31/18 for R hip pain and had xrays of the lumbar spine and R hip which showed mild degenerative findings but otherwise no acute findings. Doubt need for repeat imaging, or imaging of his  knees as he was given order for; history and physical consistent with sciatica. Will give decadron and start on steroid burst, advised continuation of home meds. F/up with PCP in 1-2wks. Strict return precautions advised. I explained the diagnosis and have given explicit precautions to return to the ER including for any other new or worsening symptoms. The patient understands and accepts the medical plan as it's been dictated and I have answered their questions. Discharge instructions concerning home care and prescriptions have been given. The patient is STABLE and is discharged to home in good condition.    Final Clinical Impressions(s) / ED Diagnoses   Final diagnoses:  Bilateral sciatica  Bilateral hip pain    ED Discharge Orders         Ordered    predniSONE (DELTASONE) 20 MG tablet     09/19/18 [address] [place], [place] 09/19/18 1743    Quintella Reichert, MD 09/25/18 1055

## 2018-09-19 NOTE — Discharge Instructions (Signed)
Your symptoms sound like sciatica. Continue using all of your usual home medications as prescribed. Take the steroids prescribed here as directed, starting tomorrow since you received your first dose today in the ED. Use heat to the areas of pain, no more than 20 minutes per hour. Follow up with your regular doctor in 1-2 weeks for recheck. Return to the ER for emergent changes or worsening symptoms.

## 2018-09-19 NOTE — ED Triage Notes (Signed)
Pt c/o bilat hip pain that radiates down both legs to his ankles. States it had been ongoing for 4 months or more.

## 2018-10-01 ENCOUNTER — Encounter: Payer: Self-pay | Admitting: Cardiology

## 2018-10-02 ENCOUNTER — Ambulatory Visit (INDEPENDENT_AMBULATORY_CARE_PROVIDER_SITE_OTHER): Payer: No Typology Code available for payment source | Admitting: Cardiology

## 2018-10-02 ENCOUNTER — Ambulatory Visit: Payer: Non-veteran care | Admitting: Cardiology

## 2018-10-02 ENCOUNTER — Encounter: Payer: Self-pay | Admitting: Cardiology

## 2018-10-02 ENCOUNTER — Other Ambulatory Visit: Payer: Self-pay

## 2018-10-02 VITALS — BP 125/60 | HR 78 | Ht 71.0 in | Wt 265.0 lb

## 2018-10-02 DIAGNOSIS — I2510 Atherosclerotic heart disease of native coronary artery without angina pectoris: Secondary | ICD-10-CM | POA: Diagnosis not present

## 2018-10-02 DIAGNOSIS — I10 Essential (primary) hypertension: Secondary | ICD-10-CM | POA: Diagnosis not present

## 2018-10-02 DIAGNOSIS — I351 Nonrheumatic aortic (valve) insufficiency: Secondary | ICD-10-CM | POA: Diagnosis not present

## 2018-10-02 NOTE — Progress Notes (Signed)
Virtual Visit via Video Note   Subjective:   Charles Riggs, male    DOB: Apr 27, 1956, 63 y.o.   MRN: [id number]  I connected with the patient on 10/02/2018 by a telephone call and verified that I am speaking with the correct person using two identifiers.     I offered the patient a video enabled application for a virtual visit. Unfortunately, this could not be accomplished due to technical difficulties/lack of video enabled phone/computer. I discussed the limitations of evaluation and management by telemedicine and the availability of in person appointments. The patient expressed understanding and agreed to proceed.   This visit type was conducted due to national recommendations for restrictions regarding the COVID-19 Pandemic (e.g. social distancing).  This format is felt to be most appropriate for this patient at this time.  All issues noted in this document were discussed and addressed.  No physical exam was performed (except for noted visual exam findings with Tele health visits).  The patient has consented to conduct a Tele health visit and understands insurance will be billed.     Chief complaint:  Aortic regurgitation  HPI  63 year old African-American male, 7 PY former smoker, OSA, LBBB, h/o asthma, severe AI, nonobstructive CAD.  Patient is doing well. He has not had any chest pain, shortness of breath since last video visit. Leg swelling has improved.   Past Medical History:  Diagnosis Date  . Acute on chronic diastolic heart failure ([place])   . Anxiety   . Aortic insufficiency   . Essential hypertension   . Nerve pain   . Non-rheumatic aortic regurgitation   . Obstructive sleep apnea   . Quadricuspid aortic valve   . Severe aortic insufficiency   . SOB (shortness of breath)     Past Surgical History:  Procedure Laterality Date  . HERNIA REPAIR      Social History   Socioeconomic History  . Marital status: Married    Spouse name: Not on file  . Number of  children: 4  . Years of education: Not on file  . Highest education level: Not on file  Occupational History  . Not on file  Social Needs  . Financial resource strain: Not on file  . Food insecurity:    Worry: Not on file    Inability: Not on file  . Transportation needs:    Medical: Not on file    Non-medical: Not on file  Tobacco Use  . Smoking status: Former Smoker    Types: Cigarettes  . Smokeless tobacco: Never Used  . Tobacco comment: 30 PY  Substance and Sexual Activity  . Alcohol use: No  . Drug use: No  . Sexual activity: Not on file  Lifestyle  . Physical activity:    Days per week: Not on file    Minutes per session: Not on file  . Stress: Not on file  Relationships  . Social connections:    Talks on phone: Not on file    Gets together: Not on file    Attends religious service: Not on file    Active member of club or organization: Not on file    Attends meetings of clubs or organizations: Not on file    Relationship status: Not on file  . Intimate partner violence:    Fear of current or ex partner: Not on file    Emotionally abused: Not on file    Physically abused: Not on file    Forced sexual  activity: Not on file  Other Topics Concern  . Not on file  Social History Narrative  . Not on file    Family History  Problem Relation Age of Onset  . COPD Father   . Cancer Brother     Current Outpatient Medications on File Prior to Visit  Medication Sig Dispense Refill  . acetaminophen (TYLENOL) 500 MG tablet Take 1,000 mg by mouth daily as needed for mild pain or moderate pain.     Marland Kitchen albuterol (PROVENTIL HFA;VENTOLIN HFA) 108 (90 Base) MCG/ACT inhaler Inhale 2 puffs into the lungs every 6 (six) hours as needed for wheezing or shortness of breath.    Marland Kitchen albuterol (PROVENTIL) (2.5 MG/3ML) 0.083% nebulizer solution Take 3 mLs (2.5 mg total) by nebulization every 6 (six) hours as needed for wheezing or shortness of breath. 75 mL 12  . amLODipine (NORVASC) 10  MG tablet Take 1 tablet (10 mg total) by mouth daily. 60 tablet 3  . budesonide-formoterol (SYMBICORT) 80-4.5 MCG/ACT inhaler Inhale 2 puffs into the lungs 2 (two) times daily.    . COMBIVENT RESPIMAT 20-100 MCG/ACT AERS respimat Inhale 2 puffs into the lungs daily as needed for shortness of breath. (Patient not taking: Reported on 09/19/2018) 1 Inhaler 0  . fluticasone (FLONASE) 50 MCG/ACT nasal spray Place 2 sprays into both nostrils daily as needed for allergies or rhinitis.    . furosemide (LASIX) 20 MG tablet 2 in the morning, 1 in the afternoon (Patient taking differently: Take 20-40 mg by mouth See admin instructions. 2 in the morning, 1 in the afternoon) 180 tablet 3  . gabapentin (NEURONTIN) 300 MG capsule Take 300 mg by mouth 3 (three) times daily.    Marland Kitchen guaiFENesin (MUCINEX) 600 MG 12 hr tablet Take 600 mg by mouth 2 (two) times daily as needed for cough.    . hydrOXYzine (VISTARIL) 25 MG capsule Take 25 mg by mouth 3 (three) times daily.    . montelukast (SINGULAIR) 10 MG tablet Take 10 mg by mouth every evening.    . nitroGLYCERIN (NITROSTAT) 0.4 MG SL tablet Place 1 tablet (0.4 mg total) under the tongue every 5 (five) minutes as needed for chest pain. 30 tablet 2  . rosuvastatin (CRESTOR) 10 MG tablet Take 1 tablet (10 mg total) by mouth daily at 6 PM. 60 tablet 2   No current facility-administered medications on file prior to visit.     Cardiovascular studies:  CTA coronary 08/27/2018: 1. No aortic dissection. 2. Inadequate coaptation of the aortic valve leaflets consistent with severe aortic insufficiency. 3. Coronary artery calcium score 37 Agatston units. This places the patient in the 50th percentile for age and gender, suggesting intermediate risk for future cardiac events. 4. Nonobstructive mild coronary disease. Interpretation of the RCA is somewhat limited by artifact but I think that there is no significant disease.  Cardiovascular studies: EKG 08/27/2018: Sinus  rhythm. LBBB (not new)  Echocardiogram 07/14/2018: Left ventricle cavity is normal in size. Moderate concentric hypertrophy of the left ventricle. Normal global wall motion. Unable to evaluate diastolic function due to severity of aortic regurgitation. Calculated EF 55%. Left atrial cavity is mildly dilated. Probably tricuspid valve with central coaptation defect. Severe aortic regurgitation. Mild to moderate mitral regurgitation. Mild tricuspid regurgitation.  No evidence of pulmonary hypertension.  Lexiscan myoview stress test 06/23/2018:  1. Lexiscan stress test was performed. Exercise capacity was notassessed. Stress symptoms included dizziness and headache. Resting blood pressure was 138/62 mmHg and peak effect blood pressure  was 162/64 mmHg. The resting and stress electrocardiogram demonstrated normal sinus rhythm, LBBB and no resting arrhythmias. Stress EKG is non diagnostic for ischemia as it is a pharmacologic stress.  2. The overall quality of the study is good. Left ventricular cavity is noted to be enlarged on the rest and stress studies. Gated SPECT images reveal global decrease in myocardial thickening and wall motion. The left ventricular ejection fraction was calculated or visually estimated to be 38%. SPECT images reveal small sized, fixed, inferior perfusion defect. Findings likely represent dilated nonischemic cardiomyopathy.  3. High risk study.  Recent labs: Results for MARGARITA, BOBROWSKI (MRN [id number]) as of 09/04/2018 08:27  Ref. Range 08/27/2018 06:04 08/27/2018 11:39 08/27/2018 16:25 08/28/2018 58:52  BASIC METABOLIC PANEL Unknown Rpt (A)   Rpt (A)  Sodium Latest Ref Range: 135 - 145 mmol/L 137   140  Potassium Latest Ref Range: 3.5 - 5.1 mmol/L 4.6   4.1  Chloride Latest Ref Range: 98 - 111 mmol/L 103   102  CO2 Latest Ref Range: 22 - 32 mmol/L 23   29  Glucose Latest Ref Range: 70 - 99 mg/dL 141 (H)   100 (H)  BUN Latest Ref Range: 8 - 23 mg/dL 10   17  Creatinine  Latest Ref Range: 0.61 - 1.24 mg/dL 1.31 (H)  1.53 (H) 1.55 (H)  Calcium Latest Ref Range: 8.9 - 10.3 mg/dL 9.0   9.1  Anion gap Latest Ref Range: 5 - 15  11   9   GFR, Est Non African American Latest Ref Range: >60 mL/min 58 (L)  48 (L) 48 (L)  GFR, Est African American Latest Ref Range: >60 mL/min >60  56 (L) 55 (L)  B Natriuretic Peptide Latest Ref Range: 0.0 - 100.0 pg/mL 304.6 (H)     Troponin I Latest Ref Range: <0.03 ng/mL <0.03 0.04 (HH) 0.06 (HH) 0.05 (HH)  Total CHOL/HDL Ratio Latest Units: RATIO    2.9  Cholesterol Latest Ref Range: 0 - 200 mg/dL    225 (H)  HDL Cholesterol Latest Ref Range: >40 mg/dL    78  LDL (calc) Latest Ref Range: 0 - 99 mg/dL    129 (H)  Triglycerides Latest Ref Range: <150 mg/dL    90  VLDL Latest Ref Range: 0 - 40 mg/dL    18   Results for MAXEMILIANO, RIEL (MRN [id number]) as of 09/04/2018 08:27  Ref. Range 08/27/2018 16:25  WBC Latest Ref Range: 4.0 - 10.5 K/uL 10.1  RBC Latest Ref Range: 4.22 - 5.81 MIL/uL 4.94  Hemoglobin Latest Ref Range: 13.0 - 17.0 g/dL 13.0  HCT Latest Ref Range: 39.0 - 52.0 % 41.5  MCV Latest Ref Range: 80.0 - 100.0 fL 84.0  MCH Latest Ref Range: 26.0 - 34.0 pg 26.3  MCHC Latest Ref Range: 30.0 - 36.0 g/dL 31.3  RDW Latest Ref Range: 11.5 - 15.5 % 13.3  Platelets Latest Ref Range: 150 - 400 K/uL 287  nRBC Latest Ref Range: 0.0 - 0.2 % 0.0    Review of Systems  Constitution: Negative for decreased appetite, malaise/fatigue, weight gain and weight loss.  HENT: Negative for congestion.   Eyes: Negative for visual disturbance.  Cardiovascular: Negative for chest pain, dyspnea on exertion, leg swelling, palpitations and syncope.  Respiratory: Negative for shortness of breath.   Endocrine: Negative for cold intolerance.  Hematologic/Lymphatic: Does not bruise/bleed easily.  Skin: Negative for itching and rash.  Musculoskeletal: Negative for myalgias.  Gastrointestinal: Negative for abdominal pain,  nausea and vomiting.   Genitourinary: Negative for dysuria.  Neurological: Negative for dizziness and weakness.  Psychiatric/Behavioral: The patient is not nervous/anxious.   All other systems reviewed and are negative.       Vitals:   10/02/18 1457  BP: 125/60  Pulse: 78   (Measured by the patient using a home BP monitor)   Physical exam: Not performed, as this is a telephone visit      Assessment & Recommendations:   63 year old African-American male, 68 PY former smoker, OSA, LBBB, h/o asthma, severe AI with quadricuspid aortic valve.  Severe aortic regurgitation: Quadricuspid aortic valve Continue amlodipine to 10 mg daily, lasix to 40 mg in am, 20 mg in pm. Continue low salt diet. Avoid heavy exertion.  Recommend RHC/LHC, coronary angiography later this month with possible surgery in June.  Hypertension: As above  Nonobstructive CAD: Continue Aspirin 81 mg, crestor 10 mg daily.   Follow up virtual visit in 3 months   Shariyah Eland Esther Hardy, MD [facility] Pager: [phone] Office: [phone] If no answer Cell [phone]

## 2018-10-02 NOTE — H&P (View-Only) (Signed)
Virtual Visit via Video Note   Subjective:   Charles Riggs, male    DOB: February 25, 1956, 63 y.o.   MRN: [id number]  I connected with the patient on 10/02/2018 by a telephone call and verified that I am speaking with the correct person using two identifiers.     I offered the patient a video enabled application for a virtual visit. Unfortunately, this could not be accomplished due to technical difficulties/lack of video enabled phone/computer. I discussed the limitations of evaluation and management by telemedicine and the availability of in person appointments. The patient expressed understanding and agreed to proceed.   This visit type was conducted due to national recommendations for restrictions regarding the COVID-19 Pandemic (e.g. social distancing).  This format is felt to be most appropriate for this patient at this time.  All issues noted in this document were discussed and addressed.  No physical exam was performed (except for noted visual exam findings with Tele health visits).  The patient has consented to conduct a Tele health visit and understands insurance will be billed.     Chief complaint:  Aortic regurgitation  HPI  63 year old African-American male, 46 PY former smoker, OSA, LBBB, h/o asthma, severe AI, nonobstructive CAD.  Patient is doing well. He has not had any chest pain, shortness of breath since last video visit. Leg swelling has improved.   Past Medical History:  Diagnosis Date  . Acute on chronic diastolic heart failure ([place])   . Anxiety   . Aortic insufficiency   . Essential hypertension   . Nerve pain   . Non-rheumatic aortic regurgitation   . Obstructive sleep apnea   . Quadricuspid aortic valve   . Severe aortic insufficiency   . SOB (shortness of breath)     Past Surgical History:  Procedure Laterality Date  . HERNIA REPAIR      Social History   Socioeconomic History  . Marital status: Married    Spouse name: Not on file  . Number of  children: 4  . Years of education: Not on file  . Highest education level: Not on file  Occupational History  . Not on file  Social Needs  . Financial resource strain: Not on file  . Food insecurity:    Worry: Not on file    Inability: Not on file  . Transportation needs:    Medical: Not on file    Non-medical: Not on file  Tobacco Use  . Smoking status: Former Smoker    Types: Cigarettes  . Smokeless tobacco: Never Used  . Tobacco comment: 30 PY  Substance and Sexual Activity  . Alcohol use: No  . Drug use: No  . Sexual activity: Not on file  Lifestyle  . Physical activity:    Days per week: Not on file    Minutes per session: Not on file  . Stress: Not on file  Relationships  . Social connections:    Talks on phone: Not on file    Gets together: Not on file    Attends religious service: Not on file    Active member of club or organization: Not on file    Attends meetings of clubs or organizations: Not on file    Relationship status: Not on file  . Intimate partner violence:    Fear of current or ex partner: Not on file    Emotionally abused: Not on file    Physically abused: Not on file    Forced sexual  activity: Not on file  Other Topics Concern  . Not on file  Social History Narrative  . Not on file    Family History  Problem Relation Age of Onset  . COPD Father   . Cancer Brother     Current Outpatient Medications on File Prior to Visit  Medication Sig Dispense Refill  . acetaminophen (TYLENOL) 500 MG tablet Take 1,000 mg by mouth daily as needed for mild pain or moderate pain.     Marland Kitchen albuterol (PROVENTIL HFA;VENTOLIN HFA) 108 (90 Base) MCG/ACT inhaler Inhale 2 puffs into the lungs every 6 (six) hours as needed for wheezing or shortness of breath.    Marland Kitchen albuterol (PROVENTIL) (2.5 MG/3ML) 0.083% nebulizer solution Take 3 mLs (2.5 mg total) by nebulization every 6 (six) hours as needed for wheezing or shortness of breath. 75 mL 12  . amLODipine (NORVASC) 10  MG tablet Take 1 tablet (10 mg total) by mouth daily. 60 tablet 3  . budesonide-formoterol (SYMBICORT) 80-4.5 MCG/ACT inhaler Inhale 2 puffs into the lungs 2 (two) times daily.    . COMBIVENT RESPIMAT 20-100 MCG/ACT AERS respimat Inhale 2 puffs into the lungs daily as needed for shortness of breath. (Patient not taking: Reported on 09/19/2018) 1 Inhaler 0  . fluticasone (FLONASE) 50 MCG/ACT nasal spray Place 2 sprays into both nostrils daily as needed for allergies or rhinitis.    . furosemide (LASIX) 20 MG tablet 2 in the morning, 1 in the afternoon (Patient taking differently: Take 20-40 mg by mouth See admin instructions. 2 in the morning, 1 in the afternoon) 180 tablet 3  . gabapentin (NEURONTIN) 300 MG capsule Take 300 mg by mouth 3 (three) times daily.    Marland Kitchen guaiFENesin (MUCINEX) 600 MG 12 hr tablet Take 600 mg by mouth 2 (two) times daily as needed for cough.    . hydrOXYzine (VISTARIL) 25 MG capsule Take 25 mg by mouth 3 (three) times daily.    . montelukast (SINGULAIR) 10 MG tablet Take 10 mg by mouth every evening.    . nitroGLYCERIN (NITROSTAT) 0.4 MG SL tablet Place 1 tablet (0.4 mg total) under the tongue every 5 (five) minutes as needed for chest pain. 30 tablet 2  . rosuvastatin (CRESTOR) 10 MG tablet Take 1 tablet (10 mg total) by mouth daily at 6 PM. 60 tablet 2   No current facility-administered medications on file prior to visit.     Cardiovascular studies:  CTA coronary 08/27/2018: 1. No aortic dissection. 2. Inadequate coaptation of the aortic valve leaflets consistent with severe aortic insufficiency. 3. Coronary artery calcium score 37 Agatston units. This places the patient in the 50th percentile for age and gender, suggesting intermediate risk for future cardiac events. 4. Nonobstructive mild coronary disease. Interpretation of the RCA is somewhat limited by artifact but I think that there is no significant disease.  Cardiovascular studies: EKG 08/27/2018: Sinus  rhythm. LBBB (not new)  Echocardiogram 07/14/2018: Left ventricle cavity is normal in size. Moderate concentric hypertrophy of the left ventricle. Normal global wall motion. Unable to evaluate diastolic function due to severity of aortic regurgitation. Calculated EF 55%. Left atrial cavity is mildly dilated. Probably tricuspid valve with central coaptation defect. Severe aortic regurgitation. Mild to moderate mitral regurgitation. Mild tricuspid regurgitation.  No evidence of pulmonary hypertension.  Lexiscan myoview stress test 06/23/2018:  1. Lexiscan stress test was performed. Exercise capacity was notassessed. Stress symptoms included dizziness and headache. Resting blood pressure was 138/62 mmHg and peak effect blood pressure  was 162/64 mmHg. The resting and stress electrocardiogram demonstrated normal sinus rhythm, LBBB and no resting arrhythmias. Stress EKG is non diagnostic for ischemia as it is a pharmacologic stress.  2. The overall quality of the study is good. Left ventricular cavity is noted to be enlarged on the rest and stress studies. Gated SPECT images reveal global decrease in myocardial thickening and wall motion. The left ventricular ejection fraction was calculated or visually estimated to be 38%. SPECT images reveal small sized, fixed, inferior perfusion defect. Findings likely represent dilated nonischemic cardiomyopathy.  3. High risk study.  Recent labs: Results for RAMONDO, DIETZE (MRN [id number]) as of 09/04/2018 08:27  Ref. Range 08/27/2018 06:04 08/27/2018 11:39 08/27/2018 16:25 08/28/2018 84:16  BASIC METABOLIC PANEL Unknown Rpt (A)   Rpt (A)  Sodium Latest Ref Range: 135 - 145 mmol/L 137   140  Potassium Latest Ref Range: 3.5 - 5.1 mmol/L 4.6   4.1  Chloride Latest Ref Range: 98 - 111 mmol/L 103   102  CO2 Latest Ref Range: 22 - 32 mmol/L 23   29  Glucose Latest Ref Range: 70 - 99 mg/dL 141 (H)   100 (H)  BUN Latest Ref Range: 8 - 23 mg/dL 10   17  Creatinine  Latest Ref Range: 0.61 - 1.24 mg/dL 1.31 (H)  1.53 (H) 1.55 (H)  Calcium Latest Ref Range: 8.9 - 10.3 mg/dL 9.0   9.1  Anion gap Latest Ref Range: 5 - 15  11   9   GFR, Est Non African American Latest Ref Range: >60 mL/min 58 (L)  48 (L) 48 (L)  GFR, Est African American Latest Ref Range: >60 mL/min >60  56 (L) 55 (L)  B Natriuretic Peptide Latest Ref Range: 0.0 - 100.0 pg/mL 304.6 (H)     Troponin I Latest Ref Range: <0.03 ng/mL <0.03 0.04 (HH) 0.06 (HH) 0.05 (HH)  Total CHOL/HDL Ratio Latest Units: RATIO    2.9  Cholesterol Latest Ref Range: 0 - 200 mg/dL    225 (H)  HDL Cholesterol Latest Ref Range: >40 mg/dL    78  LDL (calc) Latest Ref Range: 0 - 99 mg/dL    129 (H)  Triglycerides Latest Ref Range: <150 mg/dL    90  VLDL Latest Ref Range: 0 - 40 mg/dL    18   Results for RAKEEM, COLLEY (MRN [id number]) as of 09/04/2018 08:27  Ref. Range 08/27/2018 16:25  WBC Latest Ref Range: 4.0 - 10.5 K/uL 10.1  RBC Latest Ref Range: 4.22 - 5.81 MIL/uL 4.94  Hemoglobin Latest Ref Range: 13.0 - 17.0 g/dL 13.0  HCT Latest Ref Range: 39.0 - 52.0 % 41.5  MCV Latest Ref Range: 80.0 - 100.0 fL 84.0  MCH Latest Ref Range: 26.0 - 34.0 pg 26.3  MCHC Latest Ref Range: 30.0 - 36.0 g/dL 31.3  RDW Latest Ref Range: 11.5 - 15.5 % 13.3  Platelets Latest Ref Range: 150 - 400 K/uL 287  nRBC Latest Ref Range: 0.0 - 0.2 % 0.0    Review of Systems  Constitution: Negative for decreased appetite, malaise/fatigue, weight gain and weight loss.  HENT: Negative for congestion.   Eyes: Negative for visual disturbance.  Cardiovascular: Negative for chest pain, dyspnea on exertion, leg swelling, palpitations and syncope.  Respiratory: Negative for shortness of breath.   Endocrine: Negative for cold intolerance.  Hematologic/Lymphatic: Does not bruise/bleed easily.  Skin: Negative for itching and rash.  Musculoskeletal: Negative for myalgias.  Gastrointestinal: Negative for abdominal pain,  nausea and vomiting.   Genitourinary: Negative for dysuria.  Neurological: Negative for dizziness and weakness.  Psychiatric/Behavioral: The patient is not nervous/anxious.   All other systems reviewed and are negative.       Vitals:   10/02/18 1457  BP: 125/60  Pulse: 78   (Measured by the patient using a home BP monitor)   Physical exam: Not performed, as this is a telephone visit      Assessment & Recommendations:   63 year old African-American male, 86 PY former smoker, OSA, LBBB, h/o asthma, severe AI with quadricuspid aortic valve.  Severe aortic regurgitation: Quadricuspid aortic valve Continue amlodipine to 10 mg daily, lasix to 40 mg in am, 20 mg in pm. Continue low salt diet. Avoid heavy exertion.  Recommend RHC/LHC, coronary angiography later this month with possible surgery in June.  Hypertension: As above  Nonobstructive CAD: Continue Aspirin 81 mg, crestor 10 mg daily.   Follow up virtual visit in 3 months   Estevon Fluke Esther Hardy, MD [facility] Pager: [phone] Office: [phone] If no answer Cell [phone]

## 2018-10-03 ENCOUNTER — Other Ambulatory Visit: Payer: Self-pay | Admitting: Cardiology

## 2018-10-03 DIAGNOSIS — I351 Nonrheumatic aortic (valve) insufficiency: Secondary | ICD-10-CM

## 2018-10-06 ENCOUNTER — Other Ambulatory Visit (HOSPITAL_COMMUNITY): Payer: Medicaid Other | Admitting: Dentistry

## 2018-10-06 NOTE — Addendum Note (Signed)
Addended by: Nigel Mormon on: 10/06/2018 03:56 PM   Modules accepted: Orders

## 2018-10-08 ENCOUNTER — Ambulatory Visit (HOSPITAL_COMMUNITY): Payer: Self-pay | Admitting: Dentistry

## 2018-10-08 ENCOUNTER — Encounter (HOSPITAL_COMMUNITY): Payer: Self-pay | Admitting: Dentistry

## 2018-10-08 ENCOUNTER — Other Ambulatory Visit: Payer: Self-pay

## 2018-10-08 VITALS — BP 134/61 | HR 90 | Temp 98.7°F

## 2018-10-08 DIAGNOSIS — K0530 Chronic periodontitis, unspecified: Secondary | ICD-10-CM

## 2018-10-08 DIAGNOSIS — K1379 Other lesions of oral mucosa: Secondary | ICD-10-CM

## 2018-10-08 DIAGNOSIS — K030 Excessive attrition of teeth: Secondary | ICD-10-CM

## 2018-10-08 DIAGNOSIS — K036 Deposits [accretions] on teeth: Secondary | ICD-10-CM

## 2018-10-08 DIAGNOSIS — S025XXA Fracture of tooth (traumatic), initial encounter for closed fracture: Secondary | ICD-10-CM

## 2018-10-08 DIAGNOSIS — K06010 Localized gingival recession, unspecified: Secondary | ICD-10-CM

## 2018-10-08 DIAGNOSIS — M2632 Excessive spacing of fully erupted teeth: Secondary | ICD-10-CM

## 2018-10-08 DIAGNOSIS — K029 Dental caries, unspecified: Secondary | ICD-10-CM

## 2018-10-08 DIAGNOSIS — K140 Glossitis: Secondary | ICD-10-CM

## 2018-10-08 DIAGNOSIS — M264 Malocclusion, unspecified: Secondary | ICD-10-CM

## 2018-10-08 DIAGNOSIS — K083 Retained dental root: Secondary | ICD-10-CM

## 2018-10-08 DIAGNOSIS — K08409 Partial loss of teeth, unspecified cause, unspecified class: Secondary | ICD-10-CM

## 2018-10-08 DIAGNOSIS — F40232 Fear of other medical care: Secondary | ICD-10-CM

## 2018-10-08 DIAGNOSIS — K0889 Other specified disorders of teeth and supporting structures: Secondary | ICD-10-CM

## 2018-10-08 DIAGNOSIS — I351 Nonrheumatic aortic (valve) insufficiency: Secondary | ICD-10-CM

## 2018-10-08 DIAGNOSIS — Z01818 Encounter for other preprocedural examination: Secondary | ICD-10-CM

## 2018-10-08 DIAGNOSIS — K045 Chronic apical periodontitis: Secondary | ICD-10-CM

## 2018-10-08 NOTE — Progress Notes (Addendum)
DENTAL CONSULTATION  Date of Consultation:  10/08/2018 Patient Name:   Charles Riggs. Date of Birth:   April 25, 1956 Medical Record Number: [id number]  VITALS: BP 134/61 (BP Location: Right Arm)   Pulse 90   Temp 98.7 F (37.1 C)   CHIEF COMPLAINT: Patient referred by Dr. Roxy Manns for dental consultation.  HPI: Charles Riggs. is a 63 year old male recently diagnosed with severe aortic insufficiency. Patient with anticipated aortic valve replacement with Dr. Roxy Manns in the future. Patient is now seen as part of a medically necessary pre-heart valve surgery dental protocol examination to rule out dental infection that may affect the patient's systemic health and anticipated heart valve surgery.  The patient currently denies acute toothaches, swellings, or abscesses. Patient was [place] [facility] [place] [facility] [place] [facility] approximately 3 years ago. Patient had an extraction without complications. It had been 8-10 years prior to that before he had any other dental care. Patient cannot remember the name of that dentist. Patient denies having partial dentures. Patient does have a history of anxiety and dental phobia.  PROBLEM LIST: Patient Active Problem List   Diagnosis Date Noted  . Severe aortic insufficiency     Priority: High  . Coronary artery disease involving native coronary artery of native heart without angina pectoris 09/04/2018  . Elevated troponin 08/29/2018  . Acute on chronic diastolic heart failure ([place])   . Nonrheumatic aortic valve insufficiency   . Essential hypertension   . Obstructive sleep apnea   . Quadricuspid aortic valve   . Chest pain in adult 08/27/2018  . LEG PAIN 04/26/2010  . DEPRESSION 08/19/2009  . RENAL CALCULUS 01/05/2009  . DM 10/12/2008  . COCAINE ABUSE, HX OF 06/16/2007  . ASTHMA 03/24/2007  . GERD 03/24/2007    PMH: Past Medical History:  Diagnosis Date  . Acute on chronic diastolic heart failure ([place])   . Anxiety   . Aortic insufficiency    . Essential hypertension   . Nerve pain   . Non-rheumatic aortic regurgitation   . Obstructive sleep apnea   . Quadricuspid aortic valve   . Severe aortic insufficiency   . SOB (shortness of breath)     PSH: Past Surgical History:  Procedure Laterality Date  . HERNIA REPAIR      ALLERGIES: No Known Allergies  MEDICATIONS: Current Outpatient Medications  Medication Sig Dispense Refill  . acetaminophen (TYLENOL) 500 MG tablet Take 1,000 mg by mouth daily as needed for mild pain or moderate pain.     Marland Kitchen albuterol (PROVENTIL HFA;VENTOLIN HFA) 108 (90 Base) MCG/ACT inhaler Inhale 2 puffs into the lungs every 6 (six) hours as needed for wheezing or shortness of breath.    Marland Kitchen albuterol (PROVENTIL) (2.5 MG/3ML) 0.083% nebulizer solution Take 3 mLs (2.5 mg total) by nebulization every 6 (six) hours as needed for wheezing or shortness of breath. 75 mL 12  . amLODipine (NORVASC) 10 MG tablet Take 1 tablet (10 mg total) by mouth daily. 60 tablet 3  . budesonide-formoterol (SYMBICORT) 80-4.5 MCG/ACT inhaler Inhale 2 puffs into the lungs 2 (two) times daily.    . COMBIVENT RESPIMAT 20-100 MCG/ACT AERS respimat Inhale 2 puffs into the lungs daily as needed for shortness of breath. 1 Inhaler 0  . fluticasone (FLONASE) 50 MCG/ACT nasal spray Place 2 sprays into both nostrils daily as needed for allergies or rhinitis.    . furosemide (LASIX) 20 MG tablet 2 in the morning, 1 in the afternoon (Patient taking differently:  Take 20-40 mg by mouth See admin instructions. 2 in the morning, 1 in the afternoon) 180 tablet 3  . gabapentin (NEURONTIN) 300 MG capsule Take 300 mg by mouth 3 (three) times daily.    Marland Kitchen guaiFENesin (MUCINEX) 600 MG 12 hr tablet Take 600 mg by mouth 2 (two) times daily as needed for cough.    . hydrOXYzine (VISTARIL) 25 MG capsule Take 25 mg by mouth 3 (three) times daily.    . montelukast (SINGULAIR) 10 MG tablet Take 10 mg by mouth every evening.    . nitroGLYCERIN (NITROSTAT) 0.4 MG  SL tablet Place 1 tablet (0.4 mg total) under the tongue every 5 (five) minutes as needed for chest pain. 30 tablet 2  . rosuvastatin (CRESTOR) 10 MG tablet Take 1 tablet (10 mg total) by mouth daily at 6 PM. 60 tablet 2   No current facility-administered medications for this visit.     LABS: Lab Results  Component Value Date   WBC 10.1 08/27/2018   HGB 13.0 08/27/2018   HCT 41.5 08/27/2018   MCV 84.0 08/27/2018   PLT 287 08/27/2018      Component Value Date/Time   NA 140 08/28/2018 0815   K 4.1 08/28/2018 0815   CL 102 08/28/2018 0815   CO2 29 08/28/2018 0815   GLUCOSE 100 (H) 08/28/2018 0815   BUN 17 08/28/2018 0815   CREATININE 1.55 (H) 08/28/2018 0815   CALCIUM 9.1 08/28/2018 0815   GFRNONAA 48 (L) 08/28/2018 0815   GFRAA 55 (L) 08/28/2018 0815   No results found for: INR, PROTIME No results found for: PTT  SOCIAL HISTORY: Social History   Socioeconomic History  . Marital status: Married    Spouse name: Not on file  . Number of children: 4  . Years of education: Not on file  . Highest education level: Not on file  Occupational History  . Not on file  Social Needs  . Financial resource strain: Not on file  . Food insecurity:    Worry: Not on file    Inability: Not on file  . Transportation needs:    Medical: Not on file    Non-medical: Not on file  Tobacco Use  . Smoking status: Former Smoker    Types: Cigarettes  . Smokeless tobacco: Never Used  . Tobacco comment: 30 PY  Substance and Sexual Activity  . Alcohol use: No  . Drug use: No  . Sexual activity: Not on file  Lifestyle  . Physical activity:    Days per week: Not on file    Minutes per session: Not on file  . Stress: Not on file  Relationships  . Social connections:    Talks on phone: Not on file    Gets together: Not on file    Attends religious service: Not on file    Active member of club or organization: Not on file    Attends meetings of clubs or organizations: Not on file     Relationship status: Not on file  . Intimate partner violence:    Fear of current or ex partner: Not on file    Emotionally abused: Not on file    Physically abused: Not on file    Forced sexual activity: Not on file  Other Topics Concern  . Not on file  Social History Narrative  . Not on file    FAMILY HISTORY: Family History  Problem Relation Age of Onset  . COPD Father   . Cancer  Brother     REVIEW OF SYSTEMS: Reviewed with the patient as per History of present illness. Psych: Patient denies having dental phobia.  DENTAL HISTORY: CHIEF COMPLAINT: Patient referred by Dr. Roxy Manns for dental consultation.  HPI: Charles Riggs. is a 63 year old male recently diagnosed with severe aortic insufficiency.atient with anticipated aortic valve replacement with Dr. Roxy Manns in the future. Patient is now seen as part of a medically necessary pre-heart valve surgery dental protocol examination to rule out dental infection that may affect the patient's systemic health and anticipated heart valve surgery.  The patient currently denies acute toothaches, swellings, or abscesses. Patient was last seen by a dentist in the prison system approximately 3 years ago. Patient had an extraction without complications. It had been 8-10 years prior to that before he had any other dental care. Patient cannot remember the name of that dentist. Patient denies having partial dentures. Patient does have a history of anxiety and dental phobia.   DENTAL EXAMINATION: GENERAL:  The patient is a well-developed, well-nourished male in no acute distress. HEAD AND NECK:  There is no palpable neck lymphadenopathy.The patient denies acute TMJ symptoms. INTRAORAL EXAM:  Patient has normal saliva. I do not see any evidence of oral abscess formation.  The patient has a 6 x 6 mm tongue ulceration on the left lateral aspect in the area of #17. He also has a smaller 3 x 3 mm tongue ulceration of the right lateral tongue in the area  of #32. These ulcerations are consistent with trauma from the lower left and lower right posterior molars. These ulcerations have only been present for 3-4 days. Patient was informed of the need for reevaluation in 2-3 weeks with consideration of biopsy to rule out cancer at that time. DENTITION:  Patient is missing tooth numbers 1, 3, 11, 12, 13, 14, 15, 18, 30, and 31. There are retained roots in the area of tooth numbers 5 and 19.  There is evidence of incisal attrition.  Tooth #4 has a fracture of the mid-root. PERIODONTAL: The patient has chronic periodontitis with plaque and calculus accumulations, gingival recession, and incipient to moderate bone loss. DENTAL CARIES/SUBOPTIMAL RESTORATIONS:  Dental caries are noted as per dental charting form. ENDODONTIC:  The patient currently denies acute pulpitis symptoms. Patient has previous root canal therapies associated with the retained roots in the area tooth numbers 5 and 19. CROWN AND BRIDGE:  There are no crown or bridge restorations. PROSTHODONTIC:  Patient denies having partial dentures. OCCLUSION: patient has a poor occlusal scheme secondary to multiple missing teeth, supra-eruption and drifting of the unopposed teeth into the edentulous areas, multiple diastemas, multiple retained root segments, and lack of replacement of the missing teeth with dental prostheses.  RADIOGRAPHIC INTERPRETATION: An orthopantogram was taken and supplemented with a full series of dental radiographs. There are multiple missing teeth. There are retained root segments in the area tooth numbers 5 and 19.  There is incipient to moderate bone loss. There is supra-eruption and drifting of the unopposed teeth into the edentulous areas. Multiple diastemas are noted.  Tooth #4 appears to have a mid root fracture. The patient has bilateral pneumatization of the maxillary sinuses.   ASSESSMENTS: 1. Severe aortic insufficiency 2. Pre-heart valve surgery dental protocol 3.  Multiple retained root segments 4. Dental caries 5. Chronic periodontitis with bone loss 6. Gingival recession 7. Accretions 8. Incipient tooth mobility 9. Multiple missing teeth 10. Multiple diastemas 11.Supra-eruption and drifting of the unopposed teeth into the  edentulous areas 12. Poor occlusal scheme and malocclusion 13. Tooth #4 appears to have admitted root fracture. 14. Questionable need for antibiotic premedication prior to invasive dental procedures due to the severe aortic insufficiency and pending aortic valve replacement heart surgery. 15. Dental phobia 16.  Bilateral traumatic tongue ulcerations of the left lateral tongue in the area of tooth #17 and 32 respectively.  PLAN/RECOMMENDATIONS: 1. I discussed the risks, benefits, and complications of various treatment options with the patient in relationship to his medical and dental conditions, anticipated heart valve surgery, and risk for endocarditis.  We discussed various treatment options to include no treatment, multiple extractions with alveoloplasty, pre-prosthetic surgery as indicated, periodontal therapy, dental restorations, root canal therapy, crown and bridge therapy, implant therapy, and replacement of missing teeth as indicated. We also discussed referral to an oral surgeon. The patient currently wishes to proceed with referral to an oral surgeon for evaluation for extraction of retained roots in the area tooth #5 and 19 as well as need for extraction of tooth #4 with a mid-root fracture. ORal surgeon may also discuss future biopsy of the left and right lateral tongue ulcerations if these do not resolve in 2-3 weeks. The patient should be able to proceed with heart valve surgery after adequate healing from the dental extractions.   2. Discussion of findings with medical team and coordination of future medical and dental care as needed.  I spent in excess of 120 minutes during the conduct of this consultation and >50% of  this time involved direct face-to-face encounter for counseling and/or coordination of the patient's care.    Lenn Cal, DDS

## 2018-10-08 NOTE — Patient Instructions (Addendum)
The patient was referred to Dr. Diona Browner for evaluation for extractions prior to anticipated heart valve surgery. Patient is currently proceeding to Dr. Lupita Leash office for walk-in evaluation and treatment as indicated. Dr. Enrique Sack           [facility]    Department of Dental Medicine     DR. Jakeia Carreras      HEART VALVES AND MOUTH CARE:  FACTS:   If you have any infection in your mouth, it can infect your heart valve.  If you heart valve is infected, you will be seriously ill.  Infections in the mouth can be SILENT and do not always cause pain.  Examples of infections in the mouth are gum disease, dental cavities, and abscesses.  Some possible signs of infection are: Bad breath, bleeding gums, or teeth that are sensitive to sweets, hot, and/or cold. There are many other signs as well.  WHAT YOU HAVE TO DO:   Brush your teeth after meals and at bedtime. Spend at least 2 minutes brushing well, especially behind your back teeth and all around your teeth that stand alone. Brush at the gumline also.  Do not go to bed without brushing your teeth and flossing.  If you gums bleed when you brush or floss, do NOT stop brushing or flossing. It usually means that your gums need more attention and better cleaning.   If your Dentist or Dr. Enrique Sack gave you a prescription mouthwash to use, make sure to use it as directed. If you run out of the medication, get a refill at the pharmacy.   If you were given any other medications or directions by your Dentist, please follow them. If you did not understand the directions or forget what you were told, please call. We will be happy to refresh her memory.  If you need antibiotics before dental procedures, make sure you take them one hour prior to every dental visit as directed.   Get a dental checkup every 4-6 months in order to keep your mouth healthy, or to find and treat any new infection. You will most likely need your teeth cleaned or gums  treated at the same time.  If you are not able to come in for your scheduled appointment, call your Dentist as soon as possible to reschedule.  If you have a problem in between dental visits, call your Dentist.

## 2018-10-09 ENCOUNTER — Other Ambulatory Visit (HOSPITAL_COMMUNITY): Payer: Self-pay

## 2018-10-09 ENCOUNTER — Inpatient Hospital Stay (HOSPITAL_COMMUNITY): Admission: RE | Admit: 2018-10-09 | Payer: Self-pay | Source: Ambulatory Visit

## 2018-10-10 ENCOUNTER — Other Ambulatory Visit: Payer: Self-pay

## 2018-10-10 ENCOUNTER — Other Ambulatory Visit (HOSPITAL_COMMUNITY)
Admission: RE | Admit: 2018-10-10 | Discharge: 2018-10-10 | Disposition: A | Discharge status: Home / Self Care | Payer: No Typology Code available for payment source | Source: Ambulatory Visit | Attending: Cardiology | Admitting: Cardiology

## 2018-10-10 DIAGNOSIS — Z1159 Encounter for screening for other viral diseases: Secondary | ICD-10-CM | POA: Diagnosis present

## 2018-10-10 LAB — BASIC METABOLIC PANEL
BUN/Creatinine Ratio: 11 (ref 10–24)
BUN: 19 mg/dL (ref 8–27)
CO2: 26 mmol/L (ref 20–29)
Calcium: 9 mg/dL (ref 8.6–10.2)
Chloride: 102 mmol/L (ref 96–106)
Creatinine, Ser: 1.74 mg/dL — ABNORMAL HIGH (ref 0.76–1.27)
GFR calc Af Amer: 48 mL/min/{1.73_m2} — ABNORMAL LOW (ref >59)
GFR calc non Af Amer: 41 mL/min/{1.73_m2} — ABNORMAL LOW (ref >59)
Glucose: 117 mg/dL — ABNORMAL HIGH (ref 65–99)
Potassium: 4.8 mmol/L (ref 3.5–5.2)
Sodium: 140 mmol/L (ref 134–144)

## 2018-10-10 LAB — CBC
Hematocrit: 32.9 % — ABNORMAL LOW (ref 37.5–51.0)
Hemoglobin: 11 g/dL — ABNORMAL LOW (ref 13.0–17.7)
MCH: 27.6 pg (ref 26.6–33.0)
MCHC: 33.4 g/dL (ref 31.5–35.7)
MCV: 83 fL (ref 79–97)
Platelets: 269 10*3/uL (ref 150–450)
RBC: 3.98 x10E6/uL — ABNORMAL LOW (ref 4.14–5.80)
RDW: 13.2 % (ref 11.6–15.4)
WBC: 6.8 10*3/uL (ref 3.4–10.8)

## 2018-10-11 LAB — NOVEL CORONAVIRUS, NAA (HOSP ORDER, SEND-OUT TO REF LAB; TAT 18-24 HRS): SARS-CoV-2, NAA: NOT DETECTED

## 2018-10-14 ENCOUNTER — Encounter (HOSPITAL_COMMUNITY)
Admission: RE | Disposition: A | Discharge status: Home / Self Care | Payer: Self-pay | Source: Home / Self Care | Attending: Cardiology

## 2018-10-14 ENCOUNTER — Other Ambulatory Visit: Payer: Self-pay

## 2018-10-14 ENCOUNTER — Ambulatory Visit (HOSPITAL_COMMUNITY)
Admission: RE | Admit: 2018-10-14 | Discharge: 2018-10-14 | Disposition: A | Discharge status: Home / Self Care | Payer: No Typology Code available for payment source | Attending: Cardiology | Admitting: Cardiology

## 2018-10-14 DIAGNOSIS — I110 Hypertensive heart disease with heart failure: Secondary | ICD-10-CM | POA: Diagnosis not present

## 2018-10-14 DIAGNOSIS — I5033 Acute on chronic diastolic (congestive) heart failure: Secondary | ICD-10-CM | POA: Diagnosis not present

## 2018-10-14 DIAGNOSIS — I10 Essential (primary) hypertension: Secondary | ICD-10-CM | POA: Diagnosis present

## 2018-10-14 DIAGNOSIS — I447 Left bundle-branch block, unspecified: Secondary | ICD-10-CM | POA: Diagnosis not present

## 2018-10-14 DIAGNOSIS — G4733 Obstructive sleep apnea (adult) (pediatric): Secondary | ICD-10-CM | POA: Diagnosis not present

## 2018-10-14 DIAGNOSIS — J45909 Unspecified asthma, uncomplicated: Secondary | ICD-10-CM | POA: Diagnosis not present

## 2018-10-14 DIAGNOSIS — F419 Anxiety disorder, unspecified: Secondary | ICD-10-CM | POA: Diagnosis not present

## 2018-10-14 DIAGNOSIS — Q238 Other congenital malformations of aortic and mitral valves: Secondary | ICD-10-CM | POA: Insufficient documentation

## 2018-10-14 DIAGNOSIS — Z87891 Personal history of nicotine dependence: Secondary | ICD-10-CM | POA: Diagnosis not present

## 2018-10-14 DIAGNOSIS — Z79899 Other long term (current) drug therapy: Secondary | ICD-10-CM | POA: Insufficient documentation

## 2018-10-14 DIAGNOSIS — I351 Nonrheumatic aortic (valve) insufficiency: Secondary | ICD-10-CM | POA: Diagnosis present

## 2018-10-14 DIAGNOSIS — Z7951 Long term (current) use of inhaled steroids: Secondary | ICD-10-CM | POA: Diagnosis not present

## 2018-10-14 DIAGNOSIS — I359 Nonrheumatic aortic valve disorder, unspecified: Secondary | ICD-10-CM

## 2018-10-14 DIAGNOSIS — I2510 Atherosclerotic heart disease of native coronary artery without angina pectoris: Secondary | ICD-10-CM | POA: Diagnosis present

## 2018-10-14 DIAGNOSIS — Z7982 Long term (current) use of aspirin: Secondary | ICD-10-CM | POA: Diagnosis not present

## 2018-10-14 HISTORY — PX: RIGHT/LEFT HEART CATH AND CORONARY ANGIOGRAPHY: CATH118266

## 2018-10-14 LAB — POCT I-STAT EG7
Acid-Base Excess: 3 mmol/L — ABNORMAL HIGH (ref 0.0–2.0)
Bicarbonate: 29.6 mmol/L — ABNORMAL HIGH (ref 20.0–28.0)
Calcium, Ion: 1.22 mmol/L (ref 1.15–1.40)
HCT: 32 % — ABNORMAL LOW (ref 39.0–52.0)
Hemoglobin: 10.9 g/dL — ABNORMAL LOW (ref 13.0–17.0)
O2 Saturation: 66 %
Potassium: 4.7 mmol/L (ref 3.5–5.1)
Sodium: 143 mmol/L (ref 135–145)
TCO2: 31 mmol/L (ref 22–32)
pCO2, Ven: 52.9 mmHg (ref 44.0–60.0)
pH, Ven: 7.355 (ref 7.250–7.430)
pO2, Ven: 36 mmHg (ref 32.0–45.0)

## 2018-10-14 LAB — POCT I-STAT 7, (LYTES, BLD GAS, ICA,H+H)
Acid-Base Excess: 1 mmol/L (ref 0.0–2.0)
Bicarbonate: 26.6 mmol/L (ref 20.0–28.0)
Calcium, Ion: 1.16 mmol/L (ref 1.15–1.40)
HCT: 32 % — ABNORMAL LOW (ref 39.0–52.0)
Hemoglobin: 10.9 g/dL — ABNORMAL LOW (ref 13.0–17.0)
O2 Saturation: 95 %
Potassium: 4.5 mmol/L (ref 3.5–5.1)
Sodium: 144 mmol/L (ref 135–145)
TCO2: 28 mmol/L (ref 22–32)
pCO2 arterial: 47.7 mmHg (ref 32.0–48.0)
pH, Arterial: 7.355 (ref 7.350–7.450)
pO2, Arterial: 82 mmHg — ABNORMAL LOW (ref 83.0–108.0)

## 2018-10-14 SURGERY — RIGHT/LEFT HEART CATH AND CORONARY ANGIOGRAPHY
Anesthesia: LOCAL

## 2018-10-14 MED ORDER — MIDAZOLAM HCL 2 MG/2ML IJ SOLN
INTRAMUSCULAR | Status: AC
  Filled 2018-10-14: qty 2

## 2018-10-14 MED ORDER — SODIUM CHLORIDE 0.9 % IV SOLN: INTRAVENOUS | Status: AC

## 2018-10-14 MED ORDER — HEPARIN (PORCINE) IN NACL 1000-0.9 UT/500ML-% IV SOLN
INTRAVENOUS | Status: DC | PRN
  Administered 2018-10-14: 500 mL

## 2018-10-14 MED ORDER — HEPARIN SODIUM (PORCINE) 1000 UNIT/ML IJ SOLN
INTRAMUSCULAR | Status: DC | PRN
  Administered 2018-10-14: 6000 [IU] via INTRAVENOUS

## 2018-10-14 MED ORDER — VERAPAMIL HCL 2.5 MG/ML IV SOLN
INTRAVENOUS | Status: DC | PRN
  Administered 2018-10-14: 11:00:00 10 mL via INTRA_ARTERIAL

## 2018-10-14 MED ORDER — LABETALOL HCL 5 MG/ML IV SOLN: 10.0000 mg | INTRAVENOUS | Status: DC | PRN

## 2018-10-14 MED ORDER — ASPIRIN 81 MG PO CHEW
81.0000 mg | CHEWABLE_TABLET | ORAL | Status: AC
  Administered 2018-10-14: 81 mg via ORAL
  Filled 2018-10-14: qty 1

## 2018-10-14 MED ORDER — SODIUM CHLORIDE 0.9% FLUSH: 3.0000 mL | INTRAVENOUS | Status: DC | PRN

## 2018-10-14 MED ORDER — VERAPAMIL HCL 2.5 MG/ML IV SOLN
INTRAVENOUS | Status: AC
  Filled 2018-10-14: qty 2

## 2018-10-14 MED ORDER — SODIUM CHLORIDE 0.9 % WEIGHT BASED INFUSION
3.0000 mL/kg/h | INTRAVENOUS | Status: AC
  Administered 2018-10-14: 3 mL/kg/h via INTRAVENOUS

## 2018-10-14 MED ORDER — FENTANYL CITRATE (PF) 100 MCG/2ML IJ SOLN
INTRAMUSCULAR | Status: DC | PRN
  Administered 2018-10-14 (×2): 50 ug via INTRAVENOUS

## 2018-10-14 MED ORDER — MIDAZOLAM HCL 2 MG/2ML IJ SOLN
INTRAMUSCULAR | Status: DC | PRN
  Administered 2018-10-14: 1 mg via INTRAVENOUS

## 2018-10-14 MED ORDER — SODIUM CHLORIDE 0.9 % WEIGHT BASED INFUSION: 1.0000 mL/kg/h | INTRAVENOUS | Status: DC

## 2018-10-14 MED ORDER — SODIUM CHLORIDE 0.9 % IV SOLN: 250.0000 mL | INTRAVENOUS | Status: DC | PRN

## 2018-10-14 MED ORDER — ACETAMINOPHEN 325 MG PO TABS: 650.0000 mg | ORAL_TABLET | ORAL | Status: DC | PRN

## 2018-10-14 MED ORDER — SODIUM CHLORIDE 0.9% FLUSH: 3.0000 mL | Freq: Two times a day (BID) | INTRAVENOUS | Status: DC

## 2018-10-14 MED ORDER — HYDRALAZINE HCL 20 MG/ML IJ SOLN: 10.0000 mg | INTRAMUSCULAR | Status: DC | PRN

## 2018-10-14 MED ORDER — ONDANSETRON HCL 4 MG/2ML IJ SOLN: 4.0000 mg | Freq: Four times a day (QID) | INTRAMUSCULAR | Status: DC | PRN

## 2018-10-14 MED ORDER — IOHEXOL 350 MG/ML SOLN
INTRAVENOUS | Status: DC | PRN
  Administered 2018-10-14: 11:00:00 45 mL via INTRAVENOUS

## 2018-10-14 MED ORDER — HEPARIN (PORCINE) IN NACL 1000-0.9 UT/500ML-% IV SOLN
INTRAVENOUS | Status: AC
  Filled 2018-10-14: qty 1000

## 2018-10-14 MED ORDER — LIDOCAINE HCL (PF) 1 % IJ SOLN
INTRAMUSCULAR | Status: AC
  Filled 2018-10-14: qty 30

## 2018-10-14 MED ORDER — LIDOCAINE HCL (PF) 1 % IJ SOLN
INTRAMUSCULAR | Status: DC | PRN
  Administered 2018-10-14: 5 mL

## 2018-10-14 MED ORDER — FENTANYL CITRATE (PF) 100 MCG/2ML IJ SOLN
INTRAMUSCULAR | Status: AC
  Filled 2018-10-14: qty 2

## 2018-10-14 SURGICAL SUPPLY — 11 items
CATH 5FR JL3.5 JR4 ANG PIG MP (CATHETERS) ×1 IMPLANT
CATH BALLN WEDGE 5F 110CM (CATHETERS) ×1 IMPLANT
DEVICE RAD COMP TR BAND LRG (VASCULAR PRODUCTS) ×1 IMPLANT
GLIDESHEATH SLEND A-KIT 6F 22G (SHEATH) ×1 IMPLANT
GUIDEWIRE INQWIRE 1.5J.035X260 (WIRE) IMPLANT
INQWIRE 1.5J .035X260CM (WIRE) ×2
KIT HEART LEFT (KITS) ×2 IMPLANT
PACK CARDIAC CATHETERIZATION (CUSTOM PROCEDURE TRAY) ×2 IMPLANT
SHEATH GLIDE SLENDER 4/5FR (SHEATH) ×1 IMPLANT
TRANSDUCER W/STOPCOCK (MISCELLANEOUS) ×2 IMPLANT
TUBING CIL FLEX 10 FLL-RA (TUBING) ×2 IMPLANT

## 2018-10-14 NOTE — Progress Notes (Signed)
D/c instructions reviewed with wife, Deneise Lever, via telephone due to [place] restrictions.  All questions answered and Deneise Lever verbalized understanding

## 2018-10-14 NOTE — Discharge Instructions (Signed)
Radial Site Care ° °This sheet gives you information about how to care for yourself after your procedure. Your health care provider may also give you more specific instructions. If you have problems or questions, contact your health care provider. °What can I expect after the procedure? °After the procedure, it is common to have: °· Bruising and tenderness at the catheter insertion area. °Follow these instructions at home: °Medicines °· Take over-the-counter and prescription medicines only as told by your health care provider. °Insertion site care °· Follow instructions from your health care provider about how to take care of your insertion site. Make sure you: °? Wash your hands with soap and water before you change your bandage (dressing). If soap and water are not available, use hand sanitizer. °? Change your dressing as told by your health care provider. °? Leave stitches (sutures), skin glue, or adhesive strips in place. These skin closures may need to stay in place for 2 weeks or longer. If adhesive strip edges start to loosen and curl up, you may trim the loose edges. Do not remove adhesive strips completely unless your health care provider tells you to do that. °· Check your insertion site every day for signs of infection. Check for: °? Redness, swelling, or pain. °? Fluid or blood. °? Pus or a bad smell. °? Warmth. °· Do not take baths, swim, or use a hot tub until your health care provider approves. °· You may shower 24-48 hours after the procedure, or as directed by your health care provider. °? Remove the dressing and gently wash the site with plain soap and water. °? Pat the area dry with a clean towel. °? Do not rub the site. That could cause bleeding. °· Do not apply powder or lotion to the site. °Activity ° °· For 24 hours after the procedure, or as directed by your health care provider: °? Do not flex or bend the affected arm. °? Do not push or pull heavy objects with the affected arm. °? Do not  drive yourself home from the hospital or clinic. You may drive 24 hours after the procedure unless your health care provider tells you not to. °? Do not operate machinery or power tools. °· Do not lift anything that is heavier than 10 lb (4.5 kg), or the limit that you are told, until your health care provider says that it is safe. °· Ask your health care provider when it is okay to: °? Return to work or school. °? Resume usual physical activities or sports. °? Resume sexual activity. °General instructions °· If the catheter site starts to bleed, raise your arm and put firm pressure on the site. If the bleeding does not stop, get help right away. This is a medical emergency. °· If you went home on the same day as your procedure, a responsible adult should be with you for the first 24 hours after you arrive home. °· Keep all follow-up visits as told by your health care provider. This is important. °Contact a health care provider if: °· You have a fever. °· You have redness, swelling, or yellow drainage around your insertion site. °Get help right away if: °· You have unusual pain at the radial site. °· The catheter insertion area swells very fast. °· The insertion area is bleeding, and the bleeding does not stop when you hold steady pressure on the area. °· Your arm or hand becomes pale, cool, tingly, or numb. °These symptoms may represent a serious problem   that is an emergency. Do not wait to see if the symptoms will go away. Get medical help right away. Call your local emergency services (911 in the [place] Do not drive yourself to the hospital. °Summary °· After the procedure, it is common to have bruising and tenderness at the site. °· Follow instructions from your health care provider about how to take care of your radial site wound. Check the wound every day for signs of infection. °· Do not lift anything that is heavier than 10 lb (4.5 kg), or the limit that you are told, until your health care provider says  that it is safe. °This information is not intended to replace advice given to you by your health care provider. Make sure you discuss any questions you have with your health care provider. °Document Released: 06/16/2010 Document Revised: 06/19/2017 Document Reviewed: 06/19/2017 °Elsevier Interactive Patient Education © 2019 [employer] ° °

## 2018-10-14 NOTE — Interval H&P Note (Signed)
History and Physical Interval Note:  10/14/2018 10:14 AM  Charles Riggs.  has presented today for surgery, with the diagnosis of Aortic regurgitation.  The various methods of treatment have been discussed with the patient and family. After consideration of risks, benefits and other options for treatment, the patient has consented to  Procedure(s): RIGHT/LEFT HEART CATH AND CORONARY ANGIOGRAPHY (N/A) as a surgical intervention.  The patient's history has been reviewed, patient examined, no change in status, stable for surgery.  I have reviewed the patient's chart and labs.  Questions were answered to the patient's satisfaction.    2012 Appropriate Use Criteria for Diagnostic Catheterization Home / Select Test of Interest Indication for RHC Valvular Disease Valvular Disease (Right and Left Heart Catheterization or Right Heart Catheterization Alone With or  Valvular Disease  (Right and Left Heart Catheterization or Right Heart Catheterization  Alone With or Without Left Ventriculography and Coronary Angiography) Link Here: [email or web address]fi Indication:  1. Preoperative assessment before valvular surgery A (7) Indication: 70; Score 7    Charles Riggs Charles Riggs

## 2018-10-15 ENCOUNTER — Encounter (HOSPITAL_COMMUNITY): Payer: Self-pay | Admitting: Cardiology

## 2018-10-17 ENCOUNTER — Ambulatory Visit: Payer: No Typology Code available for payment source | Admitting: Thoracic Surgery (Cardiothoracic Vascular Surgery)

## 2018-10-19 ENCOUNTER — Encounter (HOSPITAL_COMMUNITY): Payer: Self-pay

## 2018-10-19 ENCOUNTER — Other Ambulatory Visit: Payer: Self-pay

## 2018-10-19 ENCOUNTER — Emergency Department (HOSPITAL_COMMUNITY)
Admission: EM | Admit: 2018-10-19 | Discharge: 2018-10-19 | Disposition: A | Discharge status: Home / Self Care | Payer: No Typology Code available for payment source | Attending: Emergency Medicine | Admitting: Emergency Medicine

## 2018-10-19 ENCOUNTER — Emergency Department (HOSPITAL_COMMUNITY): Payer: No Typology Code available for payment source

## 2018-10-19 DIAGNOSIS — I110 Hypertensive heart disease with heart failure: Secondary | ICD-10-CM | POA: Insufficient documentation

## 2018-10-19 DIAGNOSIS — I2510 Atherosclerotic heart disease of native coronary artery without angina pectoris: Secondary | ICD-10-CM | POA: Insufficient documentation

## 2018-10-19 DIAGNOSIS — R079 Chest pain, unspecified: Secondary | ICD-10-CM

## 2018-10-19 DIAGNOSIS — R1013 Epigastric pain: Secondary | ICD-10-CM | POA: Diagnosis not present

## 2018-10-19 DIAGNOSIS — J45909 Unspecified asthma, uncomplicated: Secondary | ICD-10-CM | POA: Diagnosis not present

## 2018-10-19 DIAGNOSIS — Z87891 Personal history of nicotine dependence: Secondary | ICD-10-CM | POA: Diagnosis not present

## 2018-10-19 DIAGNOSIS — Z79899 Other long term (current) drug therapy: Secondary | ICD-10-CM | POA: Insufficient documentation

## 2018-10-19 DIAGNOSIS — I5032 Chronic diastolic (congestive) heart failure: Secondary | ICD-10-CM | POA: Insufficient documentation

## 2018-10-19 LAB — BASIC METABOLIC PANEL
Anion gap: 9 (ref 5–15)
BUN: 15 mg/dL (ref 8–23)
CO2: 23 mmol/L (ref 22–32)
Calcium: 9 mg/dL (ref 8.9–10.3)
Chloride: 109 mmol/L (ref 98–111)
Creatinine, Ser: 1.82 mg/dL — ABNORMAL HIGH (ref 0.61–1.24)
GFR calc Af Amer: 45 mL/min — ABNORMAL LOW (ref >60)
GFR calc non Af Amer: 39 mL/min — ABNORMAL LOW (ref >60)
Glucose, Bld: 104 mg/dL — ABNORMAL HIGH (ref 70–99)
Potassium: 4.3 mmol/L (ref 3.5–5.1)
Sodium: 141 mmol/L (ref 135–145)

## 2018-10-19 LAB — CBC
HCT: 35.7 % — ABNORMAL LOW (ref 39.0–52.0)
Hemoglobin: 11 g/dL — ABNORMAL LOW (ref 13.0–17.0)
MCH: 26.5 pg (ref 26.0–34.0)
MCHC: 30.8 g/dL (ref 30.0–36.0)
MCV: 86 fL (ref 80.0–100.0)
Platelets: 372 10*3/uL (ref 150–400)
RBC: 4.15 MIL/uL — ABNORMAL LOW (ref 4.22–5.81)
RDW: 13.5 % (ref 11.5–15.5)
WBC: 5.9 10*3/uL (ref 4.0–10.5)
nRBC: 0 % (ref 0.0–0.2)

## 2018-10-19 LAB — TROPONIN I: Troponin I: 0.04 ng/mL (ref <0.03)

## 2018-10-19 MED ORDER — SODIUM CHLORIDE 0.9% FLUSH: 3.0000 mL | Freq: Once | INTRAVENOUS | Status: DC

## 2018-10-19 NOTE — ED Notes (Signed)
Bed: OV78 Expected date:  Expected time:  Means of arrival:  Comments: Neg pressure

## 2018-10-19 NOTE — Discharge Instructions (Signed)
The test today did not show any serious problems.  You may have some gastritis or reflux disease causing your discomfort.  To treat this consider taking Pepcid 20 mg twice a day for 2 to 3 weeks.  You could also use a antacid such as Maalox or Tums, before meals and at bedtime.  Follow-up with your PCP for further care and treatment as needed.

## 2018-10-19 NOTE — ED Provider Notes (Signed)
[facility] DEPT Provider Note   CSN: [id number] Arrival date & time: 10/19/18  1635    History   Chief Complaint Chief Complaint  Patient presents with  . Chest Pain    HPI Charles Riggs. is a 63 y.o. male.     HPI   He presents for evaluation of chest pain, constant since 5 days ago.  He states it "feels like gas.  It is located at the epigastric/lower chest region.  Yesterday he took some Ex-Lax and had a good bowel movement which was somewhat dark in color.  He is also had several bowel movements since then.  He states he is eating well.  There is been no nausea, vomiting, fever, chills, cough, shortness of breath, weakness or dizziness.  Patient has known cardiac disease, aortic regurgitation and on 10/14/2018 he had cardiac catheterization, both left and right heart.  Coronary arteries were normal.  There are no other known modifying factors.  Past Medical History:  Diagnosis Date  . Acute on chronic diastolic heart failure ([place])   . Anxiety   . Aortic insufficiency   . Essential hypertension   . Nerve pain   . Non-rheumatic aortic regurgitation   . Obstructive sleep apnea   . Quadricuspid aortic valve   . Severe aortic insufficiency   . SOB (shortness of breath)     Patient Active Problem List   Diagnosis Date Noted  . Coronary artery disease involving native coronary artery of native heart without angina pectoris 09/04/2018  . Elevated troponin 08/29/2018  . Severe aortic insufficiency   . Acute on chronic diastolic heart failure ([place])   . Nonrheumatic aortic valve insufficiency   . Essential hypertension   . Obstructive sleep apnea   . Quadricuspid aortic valve   . Chest pain in adult 08/27/2018  . LEG PAIN 04/26/2010  . DEPRESSION 08/19/2009  . RENAL CALCULUS 01/05/2009  . DM 10/12/2008  . COCAINE ABUSE, HX OF 06/16/2007  . ASTHMA 03/24/2007  . GERD 03/24/2007    Past Surgical History:  Procedure Laterality Date  .  HERNIA REPAIR    . RIGHT/LEFT HEART CATH AND CORONARY ANGIOGRAPHY N/A 10/14/2018   Procedure: RIGHT/LEFT HEART CATH AND CORONARY ANGIOGRAPHY;  Surgeon: Nigel Mormon, MD;  Location: [facility];  Service: Cardiovascular;  Laterality: N/A;        Home Medications    Prior to Admission medications   Medication Sig Start Date End Date Taking? Authorizing Provider  acetaminophen (TYLENOL) 500 MG tablet Take 1,000 mg by mouth daily as needed for mild pain or moderate pain.     [provider]  albuterol (PROVENTIL HFA;VENTOLIN HFA) 108 (90 Base) MCG/ACT inhaler Inhale 2 puffs into the lungs every 6 (six) hours as needed for wheezing or shortness of breath.    [provider]  albuterol (PROVENTIL) (2.5 MG/3ML) 0.083% nebulizer solution Take 3 mLs (2.5 mg total) by nebulization every 6 (six) hours as needed for wheezing or shortness of breath. 11/14/17   Noemi Chapel, MD  amLODipine (NORVASC) 10 MG tablet Take 1 tablet (10 mg total) by mouth daily. 09/04/18   [name], Reynold Bowen, MD  budesonide-formoterol (SYMBICORT) 80-4.5 MCG/ACT inhaler Inhale 2 puffs into the lungs 2 (two) times daily.    [provider]  COMBIVENT RESPIMAT 20-100 MCG/ACT AERS respimat Inhale 2 puffs into the lungs daily as needed for shortness of breath. 12/10/16   Law, Bea Graff, PA-C  fluticasone (FLONASE) 50 MCG/ACT  nasal spray Place 2 sprays into both nostrils daily as needed for allergies or rhinitis.    [provider]  furosemide (LASIX) 20 MG tablet 2 in the morning, 1 in the afternoon Patient taking differently: Take 20-40 mg by mouth See admin instructions. 2 in the morning, 1 in the afternoon 09/04/18   [name], Manish J, MD  gabapentin (NEURONTIN) 300 MG capsule Take 300 mg by mouth 3 (three) times daily.    [provider]  guaiFENesin (MUCINEX) 600 MG 12 hr tablet Take 600 mg by mouth 2 (two) times daily as needed for cough.    [provider]   hydrOXYzine (VISTARIL) 25 MG capsule Take 25 mg by mouth 3 (three) times daily.    [provider]  montelukast (SINGULAIR) 10 MG tablet Take 10 mg by mouth every evening.    [provider]  nitroGLYCERIN (NITROSTAT) 0.4 MG SL tablet Place 1 tablet (0.4 mg total) under the tongue every 5 (five) minutes as needed for chest pain. 08/28/18   [name], Reynold Bowen, MD  rosuvastatin (CRESTOR) 10 MG tablet Take 1 tablet (10 mg total) by mouth daily at 6 PM. 08/28/18   [name], Reynold Bowen, MD    Family History Family History  Problem Relation Age of Onset  . COPD Father   . Cancer Brother     Social History Social History   Tobacco Use  . Smoking status: Former Smoker    Types: Cigarettes  . Smokeless tobacco: Never Used  . Tobacco comment: 30 PY  Substance Use Topics  . Alcohol use: No  . Drug use: No     Allergies   Patient has no known allergies.   Review of Systems Review of Systems  All other systems reviewed and are negative.    Physical Exam Updated Vital Signs BP (!) 141/79 (BP Location: Left Arm)   Pulse 90   Temp 99 F (37.2 C) (Oral)   Resp (!) 21   Ht 5\' 11"  (1.803 m)   Wt 119.7 kg   SpO2 97%   BMI 36.82 kg/m   Physical Exam Vitals signs and nursing note reviewed.  Constitutional:      General: He is not in acute distress.    Appearance: He is well-developed. He is not ill-appearing, toxic-appearing or diaphoretic.  HENT:     Head: Normocephalic and atraumatic.     Right Ear: External ear normal.     Left Ear: External ear normal.  Eyes:     Conjunctiva/sclera: Conjunctivae normal.     Pupils: Pupils are equal, round, and reactive to light.  Neck:     Musculoskeletal: Normal range of motion and neck supple.     Trachea: Phonation normal.  Cardiovascular:     Rate and Rhythm: Normal rate and regular rhythm.     Heart sounds: Normal heart sounds.  Pulmonary:     Effort: Pulmonary effort is normal. No respiratory distress.      Breath sounds: Normal breath sounds. No stridor. No rhonchi.  Chest:     Chest wall: No tenderness.  Abdominal:     General: There is no distension.     Palpations: Abdomen is soft. There is no mass.     Tenderness: There is no abdominal tenderness.     Hernia: No hernia is present.  Musculoskeletal: Normal range of motion.  Skin:    General: Skin is warm and dry.  Neurological:     Mental Status: He is alert and  oriented to person, place, and time.     Cranial Nerves: No cranial nerve deficit.     Sensory: No sensory deficit.     Motor: No abnormal muscle tone.     Coordination: Coordination normal.  Psychiatric:        Mood and Affect: Mood normal.        Behavior: Behavior normal.        Thought Content: Thought content normal.        Judgment: Judgment normal.      ED Treatments / Results  Labs (all labs ordered are listed, but only abnormal results are displayed) Labs Reviewed  BASIC METABOLIC PANEL - Abnormal; Notable for the following components:      Result Value   Glucose, Bld 104 (*)    Creatinine, Ser 1.82 (*)    GFR calc non Af Amer 39 (*)    GFR calc Af Amer 45 (*)    All other components within normal limits  CBC - Abnormal; Notable for the following components:   RBC 4.15 (*)    Hemoglobin 11.0 (*)    HCT 35.7 (*)    All other components within normal limits  TROPONIN I - Abnormal; Notable for the following components:   Troponin I 0.04 (*)    All other components within normal limits  SARS CORONAVIRUS 2 (HOSPITAL ORDER, [facility])    EKG EKG Interpretation  Date/Time:  Sunday Oct 19 2018 16:38:51 EDT Ventricular Rate:  94 PR Interval:    QRS Duration: 119 QT Interval:  388 QTC Calculation: 486 R Axis:   23 Text Interpretation:  Sinus rhythm Ventricular premature complex Probable left atrial enlargement Incomplete left bundle branch block LVH with secondary repolarization abnormality Anterior Q waves, possibly due to  LVH Since last EKG, TWI in V5 and V6 appears new Otherwise no significant change Confirmed by Duffy Donnivan [phone]) on 10/19/2018 4:41:26 PM   Radiology Dg Chest 2 View  Result Date: 10/19/2018 CLINICAL DATA:  Pt states that he has had epigastic chest pain since Tuesday. Pt states he had a heart cath on Tuesday. H/o HTN. Former smoker. EXAM: CHEST - 2 VIEW COMPARISON:  08/27/2018 FINDINGS: Resolution of in the left lower lung opacities seen previously. Lungs are clear. Heart size and mediastinal contours are within normal limits. No effusion.  No pneumothorax. Visualized bones unremarkable. IMPRESSION: No acute cardiopulmonary disease. Electronically Signed   By: Lucrezia Europe M.D.   On: 10/19/2018 16:56    Procedures Procedures (including critical care time)  Medications Ordered in ED Medications  sodium chloride flush (NS) 0.9 % injection 3 mL (has no administration in time range)     Initial Impression / Assessment and Plan / ED Course  I have reviewed the triage vital signs and the nursing notes.  Pertinent labs & imaging results that were available during my care of the patient were reviewed by me and considered in my medical decision making (see chart for details).  Clinical Course as of Oct 18 1925  Nancy Fetter Oct 19, 2018  1923 Normal except glucose high, creatinine high, GFR low  Basic metabolic panel(!) [EW]  5784 Mild elevation, consistent with baseline.  Troponin I - ONCE - STAT(!!) [EW]  1924 Normal except hemoglobin low, consistent with baseline  CBC(!) [EW]  1924 No infiltrate or CHF, images reviewed by me  DG Chest 2 View [EW]    Clinical Course User Index [EW] Daleen Bo, MD  Patient Vitals for the past 24 hrs:  BP Temp Temp src Pulse Resp SpO2 Height Weight  10/19/18 1641 - - - - - - 5\' 11"  (1.803 m) 119.7 kg  10/19/18 1640 (!) 141/79 99 F (37.2 C) Oral 90 (!) 21 97 % - -    7:27 PM Reevaluation with update and discussion. After initial assessment  and treatment, an updated evaluation reveals no change in clinical status.  Findings discussed with the patient and all questions were answered. Daleen Bo   Medical Decision Making: Specific pain lower chest and upper abdomen, most likely consistent with gastritis or mild reflux disease.  Patient recent cardiac catheterization completely normal coronary arteries.  No indication for the ED intervention, evaluation or hospitalization.  CRITICAL CARE-no Performed by: Daleen Bo  Nursing Notes Reviewed/ Care Coordinated Applicable Imaging Reviewed Interpretation of Laboratory Data incorporated into ED treatment  The patient appears reasonably screened and/or stabilized for discharge and I doubt any other medical condition or other [facility] requiring further screening, evaluation, or treatment in the ED at this time prior to discharge.  Plan: Home Medications-continue routine medication consider trial of gastric acid blocker medication; Home Treatments-gradually advance diet and activity; return here if the recommended treatment, does not improve the symptoms; Recommended follow up-PCP checkup 1 to 2 weeks and as needed   Final Clinical Impressions(s) / ED Diagnoses   Final diagnoses:  Nonspecific chest pain  Epigastric abdominal pain    ED Discharge Orders    None       Daleen Bo, MD 10/19/18 1927

## 2018-10-19 NOTE — ED Triage Notes (Signed)
Pt states he had a heart cath on Tuesday. Pt states that he has been in 10/10 pain for several days.

## 2018-10-19 NOTE — ED Notes (Signed)
Critical value called from lab:  Troponin 0.04.  Provider made aware.

## 2018-10-19 NOTE — ED Notes (Addendum)
Pt c/o of increased SOB.  Pt assisted to sit up in bed better to facilitate better lung expansion.  Pt's satting 99% on RA

## 2018-10-29 ENCOUNTER — Telehealth (HOSPITAL_COMMUNITY): Payer: Self-pay | Admitting: Dentistry

## 2018-10-29 NOTE — Telephone Encounter (Signed)
10/29/2018  Patient:            Charles Riggs. Date of Birth:  Mar 16, 1956 MRN:                [id number]   I called the office of the oral surgeon, Dr. Diona Browner, concerning follow-up on Charles Riggs. Aldona Bar indicated that patient was seen yesterday and had tooth numbers 4, 5, 19 extracted without complications. I will forward this information to Dr. Roxy Manns ([facility]) for continued workup on proceeding with heart valve surgery.   Lenn Cal, DDS

## 2018-11-03 ENCOUNTER — Telehealth: Payer: Self-pay | Admitting: Cardiology

## 2018-11-03 ENCOUNTER — Other Ambulatory Visit: Payer: Self-pay | Admitting: *Deleted

## 2018-11-03 ENCOUNTER — Ambulatory Visit (INDEPENDENT_AMBULATORY_CARE_PROVIDER_SITE_OTHER): Payer: No Typology Code available for payment source | Admitting: Thoracic Surgery (Cardiothoracic Vascular Surgery)

## 2018-11-03 ENCOUNTER — Other Ambulatory Visit: Payer: Self-pay

## 2018-11-03 ENCOUNTER — Encounter: Payer: Self-pay | Admitting: Thoracic Surgery (Cardiothoracic Vascular Surgery)

## 2018-11-03 VITALS — BP 111/60 | HR 90 | Temp 98.1°F | Resp 16 | Ht 71.0 in | Wt 264.0 lb

## 2018-11-03 DIAGNOSIS — I351 Nonrheumatic aortic (valve) insufficiency: Secondary | ICD-10-CM | POA: Diagnosis not present

## 2018-11-03 DIAGNOSIS — Q238 Other congenital malformations of aortic and mitral valves: Secondary | ICD-10-CM | POA: Diagnosis not present

## 2018-11-03 NOTE — Progress Notes (Signed)
[address]       [place] [address]             [phone]     CARDIOTHORACIC SURGERY OFFICE NOTE  Referring Provider is [name], Reynold Bowen, MD PCP is Administration, [employer] Complaint  Patient presents with   Aortic Insuffiency    discuss surgery    HPI:  Patient is a 63 year old African-American male with history of quadricuspid aortic valve and severe aortic insufficiency, hypertension, left bundle branch block, asthma, obstructive sleep apnea, abnormal stress test and remote history of tobacco use who returns to the office today for follow-up discussion regarding treatment options for management of severe aortic insufficiency.  Patient states that he has had some degree of exertional shortness of breath that has slowly progressed for years.  He denies any previous history of heart murmur or rheumatic fever.  Over the past year symptoms of exertional shortness of breath progressed, and approximately 3 months ago he developed more severe episodes of exertional shortness of breath with occasional paroxysmal episodes of resting shortness of breath.  Initially he was thought to be suffering from asthma.  However, he began to experience lower extremity edema and orthopnea.  He was referred for cardiology consultation and seen by Dr. Virgina Jock in early February.  An echocardiogram was performed that reportedly demonstrated the presence of severe aortic insufficiency with normal left ventricular size and systolic function.  Paradoxically, a stress test was performed and reported to be high risk due to the presence of low ejection fraction.  He was notably hypertensive at the time but reportedly not experiencing active symptoms of chest pain or shortness of breath.  The patient was started on nifedipine and Lasix at that time.    The patient states that he took his medications for approximately 2 weeks but subsequently stopped because he could not afford  to fill the prescriptions.  Initially he did well but after he stopped taking his medications he began to experience worsening shortness of breath.  Last week he began to experience lower extremity edema.  He resumed taking Lasix 2 days ago.  Early this morning the patient was awoken from sleep with burning substernal chest discomfort and shortness of breath.  He was brought via EMS to the emergency department where baseline EKG revealed sinus rhythm with left bundle branch block.  Chest x-ray revealed mild pulmonary edema.  BNP was elevated 300.  Initial troponin was negative.  Oxygen saturation was in the 80s.  Symptoms improved with IV Lasix.  He was admitted to the hospital for observation and follow-up troponin level was slightly increased to 0.04 from < 0.03 at baseline.  Cardiothoracic surgical consultation was requested and I initially saw the patient in consultation on August 27, 2018.  Cardiac gated CT angiogram was performed and confirmed the presence of a quadricuspid aortic valve and revealed what was felt to be mild nonobstructive coronary artery disease, although images were suboptimal and visualization of the right coronary artery was limited.  Clinically the patient improved rapidly with intravenous diuresis and he was ultimately discharged home with plans for close medical follow-up and diagnostic cardiac catheterization at a delayed date because of the ongoing COVID-19 pandemic.  The patient subsequently underwent left and right heart catheterization on Oct 14, 2018.  He was found to have normal coronary arteries with no significant coronary artery disease.  There was mild pulmonary hypertension.  The patient was seen in consultation by  Dr. Dorothyann Gibbs in the dental clinic and ultimately underwent dental extraction by an oral surgeon.  He has been cleared to proceed with aortic valve surgery.  He returns to our office today for follow-up.  Patient is married and lives locally in [place] with  his wife.  He works as a Administrator.  He states that he still exercises on a regular basis, but this is primarily weight training.  He notes that he had to give up any kind of aerobic exercise several years ago because of exertional shortness of breath.  He gets short of breath with low-level aerobic activity.  He has had some substernal chest discomfort with strenuous exertion.    He reports that since hospital discharge he has remained essentially stable, although he did go to the emergency department  on Oct 19, 2018 complaining of mild tightness across his chest.  He was treated and released.  He now gets short of breath with moderate level activity.  He has not had palpitations, dizzy spells, nor syncope.  He denies any recent febrile illness or cough.  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  He has not been exposed to any persons who have known or suspected COVID-19 infection.  His biggest complaint at present is that of acute exacerbation of pain in his right hip that is reportedly related to degenerative arthritis.  He has been told that he will likely need right hip replacement at some point.  He has been scheduled for possible cortisone injection of his hip but this has not yet been performed.  Past Medical History:  Diagnosis Date   Acute on chronic diastolic heart failure (HCC)    Anxiety    Aortic insufficiency    Essential hypertension    Nerve pain    Non-rheumatic aortic regurgitation    Obstructive sleep apnea    Quadricuspid aortic valve    Severe aortic insufficiency    SOB (shortness of breath)     Past Surgical History:  Procedure Laterality Date   HERNIA REPAIR     RIGHT/LEFT HEART CATH AND CORONARY ANGIOGRAPHY N/A 10/14/2018   Procedure: RIGHT/LEFT HEART CATH AND CORONARY ANGIOGRAPHY;  Surgeon: Nigel Mormon, MD;  Location: [facility];  Service: Cardiovascular;  Laterality: N/A;    Family History  Problem Relation  Age of Onset   COPD Father    Cancer Brother     Social History   Socioeconomic History   Marital status: Married    Spouse name: Not on file   Number of children: 4   Years of education: Not on file   Highest education level: Not on file  Occupational History   Not on file  Social Needs   Financial resource strain: Not on file   Food insecurity:    Worry: Not on file    Inability: Not on file   Transportation needs:    Medical: Not on file    Non-medical: Not on file  Tobacco Use   Smoking status: Former Smoker    Types: Cigarettes   Smokeless tobacco: Never Used   Tobacco comment: 30 PY  Substance and Sexual Activity   Alcohol use: No   Drug use: No   Sexual activity: Not on file  Lifestyle   Physical activity:    Days per week: Not on file    Minutes per session: Not on file   Stress: Not on file  Relationships   Social connections:  Talks on phone: Not on file    Gets together: Not on file    Attends religious service: Not on file    Active member of club or organization: Not on file    Attends meetings of clubs or organizations: Not on file    Relationship status: Not on file   Intimate partner violence:    Fear of current or ex partner: Not on file    Emotionally abused: Not on file    Physically abused: Not on file    Forced sexual activity: Not on file  Other Topics Concern   Not on file  Social History Narrative   Not on file    Current Outpatient Medications  Medication Sig Dispense Refill   acetaminophen (TYLENOL) 500 MG tablet Take 1,000 mg by mouth daily as needed for mild pain or moderate pain.      albuterol (PROVENTIL HFA;VENTOLIN HFA) 108 (90 Base) MCG/ACT inhaler Inhale 2 puffs into the lungs every 6 (six) hours as needed for wheezing or shortness of breath.     albuterol (PROVENTIL) (2.5 MG/3ML) 0.083% nebulizer solution Take 3 mLs (2.5 mg total) by nebulization every 6 (six) hours as needed for wheezing or  shortness of breath. 75 mL 12   amLODipine (NORVASC) 10 MG tablet Take 1 tablet (10 mg total) by mouth daily. 60 tablet 3   budesonide-formoterol (SYMBICORT) 80-4.5 MCG/ACT inhaler Inhale 2 puffs into the lungs 2 (two) times daily.     COMBIVENT RESPIMAT 20-100 MCG/ACT AERS respimat Inhale 2 puffs into the lungs daily as needed for shortness of breath. 1 Inhaler 0   fluticasone (FLONASE) 50 MCG/ACT nasal spray Place 2 sprays into both nostrils daily as needed for allergies or rhinitis.     furosemide (LASIX) 20 MG tablet 2 in the morning, 1 in the afternoon (Patient taking differently: Take 20-40 mg by mouth See admin instructions. 2 in the morning, 1 in the afternoon) 180 tablet 3   gabapentin (NEURONTIN) 300 MG capsule Take 300 mg by mouth 3 (three) times daily.     guaiFENesin (MUCINEX) 600 MG 12 hr tablet Take 600 mg by mouth 2 (two) times daily as needed for cough.     hydrOXYzine (VISTARIL) 25 MG capsule Take 25 mg by mouth 3 (three) times daily.     montelukast (SINGULAIR) 10 MG tablet Take 10 mg by mouth every evening.     nitroGLYCERIN (NITROSTAT) 0.4 MG SL tablet Place 1 tablet (0.4 mg total) under the tongue every 5 (five) minutes as needed for chest pain. 30 tablet 2   rosuvastatin (CRESTOR) 10 MG tablet Take 1 tablet (10 mg total) by mouth daily at 6 PM. 60 tablet 2   No current facility-administered medications for this visit.     No Known Allergies    Review of Systems:              General:                      normal appetite, normal energy, no weight gain, no weight loss, no fever             Cardiac:                       + chest pain with exertion, + chest pain at rest, + SOB with exertion, no resting SOB, no PND, + orthopnea, no palpitations, no arrhythmia, no atrial fibrillation, + LE edema, no dizzy  spells, no syncope             Respiratory:                 + shortness of breath, no home oxygen, no productive cough, no dry cough, no bronchitis, no wheezing,  no hemoptysis, no asthma, no pain with inspiration or cough, + sleep apnea, no CPAP at night             GI:                               no difficulty swallowing, no reflux, no frequent heartburn, no hiatal hernia, no abdominal pain, no constipation, no diarrhea, no hematochezia, no hematemesis, no melena             GU:                              no dysuria,  no frequency, no urinary tract infection, no hematuria, no enlarged prostate, no kidney stones, no kidney disease             Vascular:                     no pain suggestive of claudication, no pain in feet, no leg cramps, no varicose veins, no DVT, no non-healing foot ulcer             Neuro:                         no stroke, no TIA's, no seizures, no headaches, no temporary blindness one eye,  no slurred speech, no peripheral neuropathy, + chronic pain, no instability of gait, no memory/cognitive dysfunction             Musculoskeletal:         + arthritis - primarily involving the right hip, no joint swelling, no myalgias, no difficulty walking, normal mobility              Skin:                            no rash, no itching, no skin infections, no pressure sores or ulcerations             Psych:                         no anxiety, no depression, no nervousness, no unusual recent stress             Eyes:                           no blurry vision, no floaters, no recent vision changes, + wears glasses for reading             ENT:                            no hearing loss, no loose or painful teeth             Hematologic:               no easy bruising, no abnormal bleeding, no clotting disorder, no frequent epistaxis  Endocrine:                   no diabetes, does not check CBG's at home     Physical Exam:   BP 111/60 (BP Location: Right Arm, Patient Position: Sitting, Cuff Size: Large)    Pulse 90    Temp 98.1 F (36.7 C) (Skin)    Resp 16    Ht 5\' 11"  (1.803 m)    Wt 264 lb (119.7 kg)    SpO2 98% Comment: ON RA   BMI  36.82 kg/m   General:  Moderately obese,  well-appearing  HEENT:  Unremarkable   Neck:   no JVD, no bruits, no adenopathy   Chest:   clear to auscultation, symmetrical breath sounds, no wheezes, no rhonchi   CV:   RRR, grade II/VI diastolic murmur   Abdomen:  soft, non-tender, no masses   Extremities:  warm, well-perfused, pulses palpable, no LE edema  Rectal/GU  Deferred  Neuro:   Grossly non-focal and symmetrical throughout  Skin:   Clean and dry, no rashes, no breakdown   Diagnostic Tests:    ECHOCARDIOGRAM LIMITED REPORT       Patient Name:   Charles Riggs Date of Exam: 08/27/2018 Medical Rec #:  [id number]      Height:       71.0 in Accession #:    [id number]     Weight:       254.5 lb Date of Birth:  [birth year]     BSA:          2.34 m Patient Age:    66 years       BP:           126/58 mmHg Patient Gender: M              HR:           85 bpm. Exam Location:  Inpatient    Procedure: 2D Echo, Cardiac Doppler and Color Doppler  Indications:     AV insufficiency 424.10 / I35.9   History:         Patient has prior history of Echocardiogram examinations, most                  recent 07/23/2018. Diabetes, Asthma.   Sonographer:     Madelaine Etienne RDCS (AE) Referring Phys:  [id number] [facility] [name] Diagnosing Phys: Vernell Leep MD  IMPRESSIONS    1. The left ventricle has low normal systolic function, with an ejection fraction of 50-55%. The cavity size was mildly dilated.  2. The right ventricle has normal systolc function. The cavity was normal.  3. The asending aorta measures upper limit of normal at 3.7 cm. No dissection seen.  4. Trieleaflet aortic valve with mild calcification. Severe aortic valve regurgitation.  5. Compared to previous study in 06/2018, LV is now mildly dilated.  FINDINGS  Left Ventricle: The left ventricle has low normal systolic function, with an ejection fraction of 50-55%. The cavity size was mildly dilated.   Right  Ventricle: The right ventricle has normal systolic function. The cavity was normal. Left Atrium: Left atrial size was normal in size. Left Atrial Appendage: Right Atrium: Right atrial size was normal in size. Pericardium: There is no evidence of pericardial effusion. Mitral Valve: The mitral valve is normal in structure. Tricuspid Valve: The tricuspid valve was normal in structure. Tricuspid valve regurgitation was not visualized by color flow Doppler. Aortic Valve: The aortic valve  is tricuspid Mild calcification of the aortic valve. Aortic valve regurgitation is severe by color flow Doppler. There is no evidence of aortic valve stenosis. Pulmonic Valve: The pulmonic valve was grossly normal. Aorta: The asending aorta measures upper limit of normal at 3.7 cm. No dissection seen. is normal in size and structure.   LEFT VENTRICLE PLAX 2D LVIDd:         5.40 cm LVIDs:         3.80 cm LV PW:         1.00 cm LV IVS:        0.90 cm LV SV:         79 ml LV SV Index:   32.45   LV Volumes (MOD) LV area d, A2C:    60.50 cm LV area d, A4C:    49.00 cm LV area s, A2C:    41.00 cm LV area s, A4C:    37.50 cm LV major d, A2C:   10.20 cm LV major d, A4C:   9.41 cm LV major s, A2C:   9.40 cm LV major s, A4C:   8.85 cm LV vol d, MOD A2C: 293.0 ml LV vol d, MOD A4C: 207.0 ml LV vol s, MOD A2C: 152.0 ml LV vol s, MOD A4C: 133.0 ml LV SV MOD A2C:     141.0 ml LV SV MOD A4C:     207.0 ml LV SV MOD BP:      108.9 ml  LEFT ATRIUM         Index LA diam:    3.00 cm 1.28 cm/m  AORTIC VALVE AV Vmax:           240.67 cm/s AV Vmean:          163.333 cm/s AV VTI:            0.451 m AV Peak Grad:      23.2 mmHg AV Mean Grad:      12.3 mmHg LVOT Vmax:         151.00 cm/s LVOT Vmean:        105.000 cm/s LVOT VTI:          0.305 m LVOT/AV VTI ratio: 0.68 AR PHT:            553 msec   AORTA Ao Root diam: 3.20 cm Ao Asc diam:  3.70 cm    SHUNTS Systemic VTI: 0.30 m    Manish  [name] MD Electronically signed by Vernell Leep MD Signature Date/Time: 08/27/2018/6:52:37 PM     RIGHT/LEFT HEART CATH AND CORONARY ANGIOGRAPHY  Conclusion   LM: Normal LAD: Normal Ramus: Normal LCx: Normal RCA: Normal  Angiographically normal coronary arteries with no significant CAD Mild pulmonary hypertnension mean PA 26 mmHg  Recommendation: Follow up with Dr. Roxy Manns re: aortic valve replacement  Nigel Mormon, MD [facility] Pager: [phone] Office: [phone] If no answer Cell [phone]    Recommendations   Antiplatelet/Anticoag In light of recent NSTEMI and anticipated aortic valve replacement.  Procedural Details   Technical Details Procedures: 1. Right heart catheterization 2. Left heart catheterization 3. Selective left and right coronary angiography 4. Conscious sedation monitoring 36 min  Indication: Severe aortic regurgitation  History: 63 year old African-American male, 9 PY former smoker, OSA, LBBB, h/o asthma, severe AI with quadricuspid aortic valve.  Diagnostic Angiography: Catheter/s advances over guidewire under fluoroscopy Left coronary artery: 5 Fr JL 3.5  Right coronary artery: 5 Fr JR 4 Left heart catheterization:  5 Fr JR 4  Pressures tracings obtained in right atrium, right ventricle, pulmonary artery, and pulmonary capillary wedge position.   Anticoagulation:  6000 units heparin  Hemostasis: TR band  Total contrast used: 45 cc   Total fluoro time: 5.0 min Air Kerma: 754 mGy  All wires and catheters removed out of the body at the end of the procedure Final angiogram showed no dissection/perforation         Estimated blood loss <50 mL.   During this procedure medications were administered to achieve and maintain moderate conscious sedation while the patient's heart rate, blood pressure, and oxygen saturation were continuously monitored and I was present face-to-face 100%  of this time.  Medications  (Filter: Administrations occurring from 10/14/18 1009 to 10/14/18 1114)  Medication Rate/Dose/Volume Action  Date Time   Heparin (Porcine) in NaCl 1000-0.9 UT/500ML-% SOLN (mL) 500 mL Given 10/14/18 1020   Total dose as of 11/03/18 1631        500 mL        Heparin (Porcine) in NaCl 1000-0.9 UT/500ML-% SOLN (mL) 500 mL Given 10/14/18 1020   Total dose as of 11/03/18 1631        500 mL        fentaNYL (SUBLIMAZE) injection (mcg) 50 mcg Given 10/14/18 1028   Total dose as of 11/03/18 1631 50 mcg Given 1038   100 mcg        midazolam (VERSED) injection (mg) 1 mg Given 10/14/18 1028   Total dose as of 11/03/18 1631        1 mg        lidocaine (PF) (XYLOCAINE) 1 % injection (mL) 5 mL Given 10/14/18 1035   Total dose as of 11/03/18 1631        5 mL        Radial Cocktail/Verapamil only (mL) 10 mL Given 10/14/18 1041   Total dose as of 11/03/18 1631        10 mL        heparin injection (Units) 6,000 Units Given 10/14/18 1048   Total dose as of 11/03/18 1631        6,000 Units        iohexol (OMNIPAQUE) 350 MG/ML injection (mL) 45 mL Given 10/14/18 1106   Total dose as of 11/03/18 1631        45 mL        Sedation Time   Sedation Time Physician-1: 36 minutes 39 seconds  Complications   Complications documented before study signed (10/14/2018 11:17 AM EDT)    RIGHT/LEFT HEART CATH AND CORONARY ANGIOGRAPHY   None Documented by Nigel Mormon, MD 10/14/2018 11:12 AM EDT  Time Range: Intraprocedure      Coronary Findings   Diagnostic  Dominance: Right  Left Main  Vessel is normal in caliber. Vessel is angiographically normal.  Left Anterior Descending  Vessel is normal in caliber. Vessel is angiographically normal.  Ramus Intermedius  Vessel is normal in caliber. Vessel is angiographically normal.  Left Circumflex  Vessel is normal in caliber. Vessel is angiographically normal.  Right Coronary Artery  Vessel is normal in caliber. Vessel is  angiographically normal.  Intervention   No interventions have been documented.  Right Heart   Right Heart Pressures RA: 7 mmHg RV: 36/6 mmHg PA: 36/6 mmHg. Mean PA 26 mmHg PCWP: 14 mmHg LVEDP `19 mmHg  Left Heart   Left Ventricle LV end diastolic pressure is mildly elevated.  Coronary Diagrams  Diagnostic  Dominance: Right    Intervention   Implants    No implant documentation for this case.  Syngo Images   Show images for CARDIAC CATHETERIZATION  Images on Long Term Storage   Show images for [place], [place].   Link to Procedure Log   Procedure Log    Hemo Data    Most Recent Value  Fick Cardiac Output 7.28 L/min  Fick Cardiac Output Index 3.07 (L/min)/BSA  RA A Wave 10 mmHg  RA V Wave 8 mmHg  RA Mean 7 mmHg  RV Systolic Pressure 35 mmHg  RV Diastolic Pressure 4 mmHg  RV EDP 10 mmHg  PA Systolic Pressure 37 mmHg  PA Diastolic Pressure 12 mmHg  PA Mean 26 mmHg  PW A Wave 17 mmHg  PW V Wave 15 mmHg  PW Mean 14 mmHg  AO Systolic Pressure 474 mmHg  AO Diastolic Pressure 74 mmHg  AO Mean 259 mmHg  LV Systolic Pressure 563 mmHg  LV Diastolic Pressure 9 mmHg  LV EDP 19 mmHg  AOp Systolic Pressure 875 mmHg  AOp Diastolic Pressure 70 mmHg  AOp Mean Pressure 643 mmHg  LVp Systolic Pressure 329 mmHg  LVp Diastolic Pressure 12 mmHg  LVp EDP Pressure 29 mmHg  QP/QS 1  TPVR Index 10.44 HRUI  TSVR Index 34.59 HRUI  PVR SVR Ratio 0.2  TPVR/TSVR Ratio 0.3     Impression:  Patient has quadricuspid aortic valve with severe symptomatic aortic insufficiency.  He was hospitalized in early April with an acute exacerbation of chronic combined systolic and diastolic congestive heart failure.  He has been clinically stable since then although he continues to experience exertional shortness of breath and chest discomfort consistent with chronic combined systolic and diastolic congestive heart failure, New York Heart Association functional class II-III.  I have  personally reviewed the patient's limited echocardiogram performed in April and the diagnostic cardiac catheterization performed last month.  Previous cardiac gated CT angiogram revealed quadricuspid aortic valve, although images from the scan were not saved for review on PACS and the exam is not currently available on [employer].  Because the valve is quadricuspid it is unlikely that the patient would be candidate for valve repair.  He would likely best be treated with valve replacement in the near future.   Plan:  The patient was counseled at length regarding treatment alternatives for management of severe aortic insufficiency including continued medical therapy versus proceeding with aortic valve repair or replacement in the near future.  The natural history of aortic insufficiency was reviewed, as was long term prognosis with medical therapy alone.  Surgical options were discussed at length including conventional surgical aortic valve replacement through either a full median sternotomy or using minimally invasive techniques.  Other alternatives including an attempt at valve repair was discussed.  Discussion was held comparing the relative risks of mechanical valve replacement with need for lifelong anticoagulation versus use of a bioprosthetic tissue valve and the associated potential for late structural valve deterioration and failure.  This discussion was placed in the context of the patient's particular circumstances, and as a result the patient specifically requests that his valve be replaced using a bioprosthetic tissue valve.  The patient is interested in whether or not his valve might be repairable and he is also particularly interested in minimally invasive surgical techniques.  As a neck step the patient will undergo transesophageal echocardiogram to further evaluate whether or not an attempt at valve repair would be  feasible.  This seems unlikely.  The patient will additionally undergo CT  angiography to evaluate the feasibility of peripheral cannulation for surgery.  The patient will return to our office in approximately 3 weeks to review the results of these tests and make final plans for surgical intervention.  During the interim he will proceed with cortisone injection of his right hip in effort to minimize ongoing symptoms related to his right hip arthritis and hopefully improve his mobility during recovery from surgery.  All questions answered.   I spent in excess of 60 minutes during the conduct of this office consultation and >50% of this time involved direct face-to-face encounter with the patient for counseling and/or coordination of their care.     Valentina Gu. Roxy Manns, MD 11/03/2018 3:06 PM

## 2018-11-03 NOTE — Patient Instructions (Signed)
Continue all previous medications without any changes at this time  

## 2018-11-03 NOTE — Telephone Encounter (Signed)
Please let the patient know that I would like to perform TEE next Tuesday to get better pictures of the valve, as requested by surgeon Dr. Roxy Manns. I have left a voicemail for the patient. Lets plan for next Tuesday, if possible.  Thanks MJP

## 2018-11-04 ENCOUNTER — Telehealth: Payer: Self-pay

## 2018-11-04 NOTE — Telephone Encounter (Signed)
Patient called and said that he has had heart burn for 1 week, went to ER on 5/24.Marland Kitchen Pt taking pepcid for days now and is not getting any relief after taking  Is there something you can recommend.

## 2018-11-05 ENCOUNTER — Other Ambulatory Visit: Payer: Self-pay

## 2018-11-05 MED ORDER — OMEPRAZOLE 40 MG PO CPDR: 40.0000 mg | DELAYED_RELEASE_CAPSULE | Freq: Every day | ORAL | 0 refills | Status: DC

## 2018-11-05 NOTE — Telephone Encounter (Signed)
done

## 2018-11-06 ENCOUNTER — Other Ambulatory Visit: Payer: Self-pay | Admitting: Cardiology

## 2018-11-06 DIAGNOSIS — I351 Nonrheumatic aortic (valve) insufficiency: Secondary | ICD-10-CM

## 2018-11-07 ENCOUNTER — Other Ambulatory Visit (HOSPITAL_COMMUNITY)
Admission: RE | Admit: 2018-11-07 | Discharge: 2018-11-07 | Disposition: A | Discharge status: Home / Self Care | Payer: No Typology Code available for payment source | Source: Ambulatory Visit | Attending: Cardiology | Admitting: Cardiology

## 2018-11-07 DIAGNOSIS — Z1159 Encounter for screening for other viral diseases: Secondary | ICD-10-CM | POA: Insufficient documentation

## 2018-11-07 DIAGNOSIS — Z01812 Encounter for preprocedural laboratory examination: Secondary | ICD-10-CM | POA: Insufficient documentation

## 2018-11-08 LAB — NOVEL CORONAVIRUS, NAA (HOSP ORDER, SEND-OUT TO REF LAB; TAT 18-24 HRS): SARS-CoV-2, NAA: NOT DETECTED

## 2018-11-11 ENCOUNTER — Other Ambulatory Visit: Payer: Self-pay

## 2018-11-11 ENCOUNTER — Ambulatory Visit (HOSPITAL_COMMUNITY)
Admission: RE | Admit: 2018-11-11 | Discharge: 2018-11-11 | Disposition: A | Discharge status: Home / Self Care | Payer: No Typology Code available for payment source | Source: Ambulatory Visit | Attending: Cardiology | Admitting: Cardiology

## 2018-11-11 ENCOUNTER — Ambulatory Visit (HOSPITAL_COMMUNITY)
Admission: RE | Admit: 2018-11-11 | Discharge: 2018-11-11 | Disposition: A | Discharge status: Home / Self Care | Payer: No Typology Code available for payment source | Attending: Cardiology | Admitting: Cardiology

## 2018-11-11 ENCOUNTER — Encounter (HOSPITAL_COMMUNITY): Payer: Self-pay

## 2018-11-11 ENCOUNTER — Encounter (HOSPITAL_COMMUNITY)
Admission: RE | Disposition: A | Discharge status: Home / Self Care | Payer: Self-pay | Source: Home / Self Care | Attending: Cardiology

## 2018-11-11 DIAGNOSIS — F419 Anxiety disorder, unspecified: Secondary | ICD-10-CM | POA: Diagnosis not present

## 2018-11-11 DIAGNOSIS — Z7982 Long term (current) use of aspirin: Secondary | ICD-10-CM | POA: Diagnosis not present

## 2018-11-11 DIAGNOSIS — Z79899 Other long term (current) drug therapy: Secondary | ICD-10-CM | POA: Diagnosis not present

## 2018-11-11 DIAGNOSIS — Z87891 Personal history of nicotine dependence: Secondary | ICD-10-CM | POA: Insufficient documentation

## 2018-11-11 DIAGNOSIS — I351 Nonrheumatic aortic (valve) insufficiency: Secondary | ICD-10-CM | POA: Insufficient documentation

## 2018-11-11 DIAGNOSIS — I110 Hypertensive heart disease with heart failure: Secondary | ICD-10-CM | POA: Diagnosis not present

## 2018-11-11 DIAGNOSIS — Q238 Other congenital malformations of aortic and mitral valves: Secondary | ICD-10-CM | POA: Insufficient documentation

## 2018-11-11 DIAGNOSIS — I5032 Chronic diastolic (congestive) heart failure: Secondary | ICD-10-CM | POA: Diagnosis not present

## 2018-11-11 DIAGNOSIS — Z7951 Long term (current) use of inhaled steroids: Secondary | ICD-10-CM | POA: Insufficient documentation

## 2018-11-11 DIAGNOSIS — G4733 Obstructive sleep apnea (adult) (pediatric): Secondary | ICD-10-CM | POA: Diagnosis not present

## 2018-11-11 DIAGNOSIS — I359 Nonrheumatic aortic valve disorder, unspecified: Secondary | ICD-10-CM

## 2018-11-11 HISTORY — PX: TEE WITHOUT CARDIOVERSION: SHX5443

## 2018-11-11 SURGERY — ECHOCARDIOGRAM, TRANSESOPHAGEAL
Anesthesia: Moderate Sedation

## 2018-11-11 MED ORDER — FENTANYL CITRATE (PF) 100 MCG/2ML IJ SOLN
INTRAMUSCULAR | Status: DC | PRN
  Administered 2018-11-11: 50 ug via INTRAVENOUS

## 2018-11-11 MED ORDER — MIDAZOLAM HCL 2 MG/2ML IJ SOLN
INTRAMUSCULAR | Status: DC | PRN
  Administered 2018-11-11: 1 mg via INTRAVENOUS

## 2018-11-11 MED ORDER — MIDAZOLAM HCL (PF) 5 MG/ML IJ SOLN
INTRAMUSCULAR | Status: AC
  Filled 2018-11-11: qty 2

## 2018-11-11 MED ORDER — BUTAMBEN-TETRACAINE-BENZOCAINE 2-2-14 % EX AERO
INHALATION_SPRAY | CUTANEOUS | Status: DC | PRN
  Administered 2018-11-11: 2 via TOPICAL

## 2018-11-11 MED ORDER — SODIUM CHLORIDE 0.9 % IV SOLN
INTRAVENOUS | Status: DC
  Administered 2018-11-11: 09:00:00 via INTRAVENOUS

## 2018-11-11 MED ORDER — FENTANYL CITRATE (PF) 100 MCG/2ML IJ SOLN
INTRAMUSCULAR | Status: AC
  Filled 2018-11-11: qty 2

## 2018-11-11 MED ORDER — DIPHENHYDRAMINE HCL 50 MG/ML IJ SOLN
INTRAMUSCULAR | Status: AC
  Filled 2018-11-11: qty 1

## 2018-11-11 NOTE — CV Procedure (Signed)
TEE: Under moderate sedation, TEE was performed without complications: LV: Normal. Normal EF. RV: Normal LA: Normal. Left atrial appendage: Normal without thrombus. Normal function. Inter atrial septum is intact without defect. Double contrast study negative for atrial level shunting. No late appearance of bubbles either. RA: Normal  MV: Normal Trace MR. TV: Normal. No TR AV: Quadricuspid aortic valve with severe aortic insufficiency. PV: Normal. Trace PI.  Thoracic and ascending aorta: Normal without significant plaque or atheromatous changes.  Conscious sedation protocol was followed, I personally administered conscious sedation and monitored the patient. Patient received 1 milligrams of Versed and 50 . Patient tolerated the procedure well and there was no complication from conscious sedation. Time administered was 15 min  [place], MD [facility] [place] Pager: [phone] Office: [phone] If no answer Cell [phone]  .

## 2018-11-11 NOTE — Progress Notes (Signed)
  Echocardiogram Echocardiogram Transesophageal has been performed.  Cecely Rengel L Androw 11/11/2018, 9:47 AM

## 2018-11-11 NOTE — Discharge Instructions (Signed)

## 2018-11-11 NOTE — Interval H&P Note (Signed)
History and Physical Interval Note:  11/11/2018 8:46 AM  Charles Riggs.  has presented today for surgery, with the diagnosis of [name].  The various methods of treatment have been discussed with the patient and family. After consideration of risks, benefits and other options for treatment, the patient has consented to  Procedure(s): TRANSESOPHAGEAL ECHOCARDIOGRAM (TEE) (N/A) as a surgical intervention.  The patient's history has been reviewed, patient examined, no change in status, stable for surgery.  I have reviewed the patient's chart and labs.  Questions were answered to the patient's satisfaction.     [place]

## 2018-11-11 NOTE — H&P (Signed)
Charles Riggs. is an 63 y.o. male.   Chief Complaint: Severe aortic regurgitation HPI:   63 y.o. African American male  with quadricuspid aortic valve with severe AI with upcoming aortic valve surgery.  Past Medical History:  Diagnosis Date  . Acute on chronic diastolic heart failure ([place])   . Anxiety   . Aortic insufficiency   . Essential hypertension   . Nerve pain   . Non-rheumatic aortic regurgitation   . Obstructive sleep apnea   . Quadricuspid aortic valve   . Severe aortic insufficiency   . SOB (shortness of breath)     Past Surgical History:  Procedure Laterality Date  . HERNIA REPAIR    . RIGHT/LEFT HEART CATH AND CORONARY ANGIOGRAPHY N/A 10/14/2018   Procedure: RIGHT/LEFT HEART CATH AND CORONARY ANGIOGRAPHY;  Surgeon: Nigel Mormon, MD;  Location: [facility];  Service: Cardiovascular;  Laterality: N/A;    Family History  Problem Relation Age of Onset  . COPD Father   . Cancer Brother    Social History:  reports that he has quit smoking. His smoking use included cigarettes. He has never used smokeless tobacco. He reports that he does not drink alcohol or use drugs.  Allergies: No Known Allergies  Review of Systems  Constitution: Negative for decreased appetite, malaise/fatigue, weight gain and weight loss.  HENT: Negative for congestion.   Eyes: Negative for visual disturbance.  Cardiovascular: Positive for dyspnea on exertion. Negative for chest pain, leg swelling, palpitations and syncope.  Respiratory: Negative for cough.   Endocrine: Negative for cold intolerance.  Hematologic/Lymphatic: Does not bruise/bleed easily.  Skin: Negative for itching and rash.  Musculoskeletal: Negative for myalgias.  Gastrointestinal: Negative for abdominal pain, nausea and vomiting.  Genitourinary: Negative for dysuria.  Neurological: Negative for dizziness and weakness.  Psychiatric/Behavioral: The patient is not nervous/anxious.   All other systems  reviewed and are negative.    There were no vitals taken for this visit. There is no height or weight on file to calculate BMI.  Physical Exam  Constitutional: He is oriented to person, place, and time. He appears well-developed and well-nourished. No distress.  HENT:  Head: Normocephalic and atraumatic.  Eyes: Pupils are equal, round, and reactive to light. Conjunctivae are normal.  Neck: No JVD present.  Cardiovascular: Normal rate, regular rhythm and intact distal pulses.  Murmur heard. High-pitched blowing decrescendo early diastolic murmur is present with a grade of 4/6 at the upper right sternal border radiating to the apex. Pulmonary/Chest: Effort normal and breath sounds normal. He has no wheezes. He has no rales.  Abdominal: Soft. Bowel sounds are normal. There is no rebound.  Musculoskeletal:        General: No edema.  Lymphadenopathy:    He has no cervical adenopathy.  Neurological: He is alert and oriented to person, place, and time. No cranial nerve deficit.  Skin: Skin is warm and dry.  Psychiatric: He has a normal mood and affect.  Nursing note and vitals reviewed.   No results found for this or any previous visit (from the past 48 hour(s)).  Labs:   Lab Results  Component Value Date   WBC 5.9 10/19/2018   HGB 11.0 (L) 10/19/2018   HCT 35.7 (L) 10/19/2018   MCV 86.0 10/19/2018   PLT 372 10/19/2018   No results for input(s): NA, K, CL, CO2, BUN, CREATININE, CALCIUM, PROT, BILITOT, ALKPHOS, ALT, AST, GLUCOSE in the last 168 hours.  Invalid input(s): LABALBU  Lipid  Panel     Component Value Date/Time   CHOL 225 (H) 08/28/2018 0815   TRIG 90 08/28/2018 0815   HDL 78 08/28/2018 0815   CHOLHDL 2.9 08/28/2018 0815   VLDL 18 08/28/2018 0815   LDLCALC 129 (H) 08/28/2018 0815    BNP (last 3 results) Recent Labs    11/14/17 2110 08/27/18 0604  BNP 65.1 304.6*    HEMOGLOBIN A1C Lab Results  Component Value Date   HGBA1C 6.3 10/05/2008    Cardiac  Panel (last 3 results) Recent Labs    08/27/18 1625 08/28/18 0815 10/19/18 1708  TROPONINI 0.06* 0.05* 0.04*    Lab Results  Component Value Date   TROPONINI 0.04 (HH) 10/19/2018     TSH No results for input(s): TSH in the last 8760 hours.   Medications Prior to Admission  Medication Sig Dispense Refill  . acetaminophen (TYLENOL) 500 MG tablet Take 1,000 mg by mouth daily as needed for mild pain or moderate pain.     Marland Kitchen albuterol (PROVENTIL HFA;VENTOLIN HFA) 108 (90 Base) MCG/ACT inhaler Inhale 2 puffs into the lungs every 6 (six) hours as needed for wheezing or shortness of breath.    Marland Kitchen albuterol (PROVENTIL) (2.5 MG/3ML) 0.083% nebulizer solution Take 3 mLs (2.5 mg total) by nebulization every 6 (six) hours as needed for wheezing or shortness of breath. 75 mL 12  . amLODipine (NORVASC) 10 MG tablet Take 1 tablet (10 mg total) by mouth daily. 60 tablet 3  . aspirin EC 81 MG tablet Take 81 mg by mouth daily.    . budesonide-formoterol (SYMBICORT) 160-4.5 MCG/ACT inhaler Inhale 2 puffs into the lungs 2 (two) times daily.    . COMBIVENT RESPIMAT 20-100 MCG/ACT AERS respimat Inhale 2 puffs into the lungs daily as needed for shortness of breath. 1 Inhaler 0  . dextromethorphan-guaiFENesin (MUCINEX DM) 30-600 MG 12hr tablet Take 1 tablet by mouth 2 (two) times daily.    . fluticasone (FLONASE) 50 MCG/ACT nasal spray Place 2 sprays into both nostrils daily as needed for allergies or rhinitis.    . furosemide (LASIX) 20 MG tablet 2 in the morning, 1 in the afternoon (Patient taking differently: Take 20 mg by mouth 2 (two) times daily. ) 180 tablet 3  . gabapentin (NEURONTIN) 600 MG tablet Take 600 mg by mouth 3 (three) times daily.    Marland Kitchen HYDROcodone-acetaminophen (NORCO/VICODIN) 5-325 MG tablet Take 1 tablet by mouth every 4 (four) hours as needed for moderate pain.    . nitroGLYCERIN (NITROSTAT) 0.4 MG SL tablet Place 1 tablet (0.4 mg total) under the tongue every 5 (five) minutes as needed  for chest pain. 30 tablet 2  . omeprazole (PRILOSEC) 40 MG capsule Take 1 capsule (40 mg total) by mouth daily. 30 capsule 0  . rosuvastatin (CRESTOR) 10 MG tablet Take 1 tablet (10 mg total) by mouth daily at 6 PM. 60 tablet 2  . hydrOXYzine (VISTARIL) 25 MG capsule Take 25 mg by mouth 3 (three) times daily.    . montelukast (SINGULAIR) 10 MG tablet Take 10 mg by mouth every evening.       No current facility-administered medications for this encounter.    Vitals pending   Assessment/Plan  63 y.o. African American male  with quadricuspid aortic valve with severe AI with upcoming aortic valve surgery.  Plan: TEE  Nigel Mormon, MD 11/11/2018, 8:02 AM [facility] [place] Pager: [phone] Office: [phone] If no answer: [phone]

## 2018-11-12 ENCOUNTER — Telehealth: Payer: Self-pay

## 2018-11-12 NOTE — Telephone Encounter (Signed)
FYI-Pt called to advise that the Prilosec you rx'd is working.//ah

## 2018-11-18 ENCOUNTER — Ambulatory Visit
Admission: RE | Admit: 2018-11-18 | Discharge: 2018-11-18 | Disposition: A | Discharge status: Home / Self Care | Payer: No Typology Code available for payment source | Source: Ambulatory Visit | Attending: Thoracic Surgery (Cardiothoracic Vascular Surgery) | Admitting: Thoracic Surgery (Cardiothoracic Vascular Surgery)

## 2018-11-18 DIAGNOSIS — I351 Nonrheumatic aortic (valve) insufficiency: Secondary | ICD-10-CM

## 2018-11-18 MED ORDER — IOPAMIDOL (ISOVUE-370) INJECTION 76%
75.0000 mL | Freq: Once | INTRAVENOUS | Status: AC | PRN
  Administered 2018-11-18: 75 mL via INTRAVENOUS

## 2018-11-24 ENCOUNTER — Other Ambulatory Visit: Payer: Self-pay | Admitting: *Deleted

## 2018-11-24 ENCOUNTER — Encounter: Payer: Self-pay | Admitting: Thoracic Surgery (Cardiothoracic Vascular Surgery)

## 2018-11-24 ENCOUNTER — Other Ambulatory Visit: Payer: Self-pay

## 2018-11-24 ENCOUNTER — Ambulatory Visit (INDEPENDENT_AMBULATORY_CARE_PROVIDER_SITE_OTHER): Payer: No Typology Code available for payment source | Admitting: Thoracic Surgery (Cardiothoracic Vascular Surgery)

## 2018-11-24 VITALS — BP 130/70 | HR 85 | Temp 97.5°F | Resp 16 | Ht 71.0 in | Wt 264.0 lb

## 2018-11-24 DIAGNOSIS — I351 Nonrheumatic aortic (valve) insufficiency: Secondary | ICD-10-CM

## 2018-11-24 DIAGNOSIS — Q238 Other congenital malformations of aortic and mitral valves: Secondary | ICD-10-CM | POA: Diagnosis not present

## 2018-11-24 DIAGNOSIS — I10 Essential (primary) hypertension: Secondary | ICD-10-CM

## 2018-11-24 DIAGNOSIS — I5033 Acute on chronic diastolic (congestive) heart failure: Secondary | ICD-10-CM

## 2018-11-24 MED ORDER — FUROSEMIDE 20 MG PO TABS: 40.0000 mg | ORAL_TABLET | Freq: Two times a day (BID) | ORAL | 0 refills | Status: DC

## 2018-11-24 NOTE — Patient Instructions (Addendum)
Stop taking aspirin 7 days prior to surgery  Continue taking all other medications without change through the day before surgery.  Have nothing to eat or drink after midnight the night before surgery.  On the morning of surgery take only Prilosec with a sip of water.  You may use your inhaler

## 2018-11-24 NOTE — Progress Notes (Signed)
[address]       [place] [address]             [phone]     CARDIOTHORACIC SURGERY OFFICE NOTE  Referring Provider is [name], Reynold Bowen, MD PCP is [facility]   HPI:  Patient is a 63 year old African-American male with history of quadricuspid aortic valve and severe aortic insufficiency, hypertension, left bundle branch block, asthma, obstructive sleep apnea, and remote history of tobacco use who returns to the office today for follow-up of severe symptomatic aortic insufficiency.  He was initially seen in consultation on August 27, 2018 and he was last seen here in our office on November 03, 2018.  Since then he underwent transesophageal echocardiogram to further evaluate whether or not an attempt at valve repair might be feasible.  He also underwent CT angiography to evaluate the feasibility of peripheral cannulation for surgery.  He has been referred for steroid injection of his left hip because of severe degenerative arthritis with ongoing pain, and hip injection has been scheduled for December 10, 2018.  He returns to the office today to review the results of his tests and hopefully schedule surgery soon after his hip injection has been performed with intention that improved pain relief related to his hip will facilitate his postoperative recovery following surgery.  He reports no new problems or complaints.  He remains severely limited by pain in his left hip.  He remains ambulatory but it is really bothering him considerably.  He understands that he will likely need hip replacement once his aortic valve is been definitively treated.  The remainder of his review of systems is unchanged.   Current Outpatient Medications  Medication Sig Dispense Refill  . acetaminophen (TYLENOL) 500 MG tablet Take 1,000 mg by mouth daily as needed for mild pain or moderate pain.     Marland Kitchen albuterol (PROVENTIL HFA;VENTOLIN HFA) 108 (90 Base) MCG/ACT inhaler Inhale 2 puffs into  the lungs every 6 (six) hours as needed for wheezing or shortness of breath.    Marland Kitchen albuterol (PROVENTIL) (2.5 MG/3ML) 0.083% nebulizer solution Take 3 mLs (2.5 mg total) by nebulization every 6 (six) hours as needed for wheezing or shortness of breath. 75 mL 12  . amLODipine (NORVASC) 10 MG tablet Take 1 tablet (10 mg total) by mouth daily. 60 tablet 3  . aspirin EC 81 MG tablet Take 81 mg by mouth daily.    . budesonide-formoterol (SYMBICORT) 160-4.5 MCG/ACT inhaler Inhale 2 puffs into the lungs 2 (two) times daily.    . COMBIVENT RESPIMAT 20-100 MCG/ACT AERS respimat Inhale 2 puffs into the lungs daily as needed for shortness of breath. 1 Inhaler 0  . dextromethorphan-guaiFENesin (MUCINEX DM) 30-600 MG 12hr tablet Take 1 tablet by mouth 2 (two) times daily.    . fluticasone (FLONASE) 50 MCG/ACT nasal spray Place 2 sprays into both nostrils daily as needed for allergies or rhinitis.    . furosemide (LASIX) 20 MG tablet 2 in the morning, 1 in the afternoon (Patient taking differently: Take 20 mg by mouth 2 (two) times daily. ) 180 tablet 3  . gabapentin (NEURONTIN) 600 MG tablet Take 600 mg by mouth 3 (three) times daily.    Marland Kitchen HYDROcodone-acetaminophen (NORCO/VICODIN) 5-325 MG tablet Take 1 tablet by mouth every 4 (four) hours as needed for moderate pain.    . hydrOXYzine (VISTARIL) 25 MG capsule Take 25 mg by mouth 3 (three) times daily.    . montelukast (  SINGULAIR) 10 MG tablet Take 10 mg by mouth every evening.    . nitroGLYCERIN (NITROSTAT) 0.4 MG SL tablet Place 1 tablet (0.4 mg total) under the tongue every 5 (five) minutes as needed for chest pain. 30 tablet 2  . omeprazole (PRILOSEC) 40 MG capsule Take 1 capsule (40 mg total) by mouth daily. 30 capsule 0  . rosuvastatin (CRESTOR) 10 MG tablet Take 1 tablet (10 mg total) by mouth daily at 6 PM. 60 tablet 2   No current facility-administered medications for this visit.       Physical Exam:   BP 130/70 (BP Location: Right Arm, Patient  Position: Sitting, Cuff Size: Large)   Pulse 85   Temp (!) 97.5 F (36.4 C) Comment: thermal  Resp 16   Ht 5\' 11"  (1.803 m)   Wt 264 lb (119.7 kg)   SpO2 97% Comment: RA  BMI 36.82 kg/m   General:  Well-appearing  Chest:   Clear to auscultation  CV:   Regular rate and rhythm with holodiastolic murmur  Incisions:  n/a  Abdomen:  Soft nontender  Extremities:  Warm and well-perfused  Diagnostic Tests:  Procedure: Transesophageal Echo  Indications:     Aortic valve disorder 424.1 / I35.9   History:         Patient has prior history of Echocardiogram examinations, most                  recent 08/27/2018. Quadricuspid aortic valve and Aortic Valve                  Disease Signs/Symptoms: Chest Pain Risk Factors: Hypertension                  and Diabetes. Obstructive sleep apnea                  COCAINE ABUSE, HX OF.   Sonographer:     Talmage Coin Referring Phys:  [id number] [facility] J [name] Diagnosing Phys: Vernell Leep MD Report CC'd to:  Vernell Leep MD     PROCEDURE: Local oropharyngeal anesthetic was provided with Benzocaine spray. Patients was under conscious sedation during this procedure. Anesthetic was administered intravenously by performing Physician: 21mcg of Fentanyl, 1.0mg  of Versed. The  transesophogeal probe was passed through the esophogus of the patient. Image quality was good. The patient's vital signs; including heart rate, blood pressure, and oxygen saturation; remained stable throughout the procedure. The patient developed no  complications during the procedure.  IMPRESSIONS    1. The left ventricle has mildly reduced systolic function, with an ejection fraction of 45-50%. The cavity size was moderately dilated. No evidence of left ventricular regional wall motion abnormalities.  2. Quadricuspid aortic valve with central coaptation defect and severe aortic insufficiency. Diastolic flow reversal seen in descending aorta.  3. There is mild  dilatation of the ascending aorta measuring 39 mm.  FINDINGS  Left Ventricle: The left ventricle has mildly reduced systolic function, with an ejection fraction of 45-50%. The cavity size was moderately dilated. No evidence of left ventricular regional wall motion abnormalities.  Right Ventricle: The right ventricle has normal systolic function.  Left Atrium: Left atrial size was normal in size.   Right Atrium: Right atrial size was normal in size. Right atrial pressure is estimated at 10 mmHg.  Pericardium: There is no evidence of pericardial effusion.  Mitral Valve: The mitral valve is grossly normal. Mitral valve regurgitation is trivial by color flow Doppler.  Tricuspid Valve:  The tricuspid valve was grossly normal. Tricuspid valve regurgitation was not visualized by color flow Doppler.  Aortic Valve: Quadricuspid aortic valve with central coaptation defect and severe aortic insufficiency. Diastolic flow reversal seen in descending aorta.  Pulmonic Valve: The pulmonic valve was grossly normal. Pulmonic valve regurgitation is not visualized by color flow Doppler.  Aorta: There is mild dilatation of the ascending aorta measuring 39 mm.    +------------+--------++ AORTIC VALVE         +------------+--------++ AR PHT:     292 msec +------------+--------++    Vernell Leep MD Electronically signed by Vernell Leep MD Signature Date/Time: 11/11/2018/10:03:52 AM      CT ANGIOGRAPHY CHEST, ABDOMEN AND PELVIS  TECHNIQUE: Multidetector CT imaging through the chest, abdomen and pelvis was performed using the standard protocol during bolus administration of intravenous contrast. Multiplanar reconstructed images and MIPs were obtained and reviewed to evaluate the vascular anatomy.  Creatinine was obtained on site at [facility] at [address]  Results: Creatinine 1.7 mg/dL.  CONTRAST:  69mL ISOVUE-370 IOPAMIDOL (ISOVUE-370)  INJECTION 76%  COMPARISON:  CT scan of the chest 08/27/2018; CT scan of the abdomen and pelvis 10/19/2016  FINDINGS: CTA CHEST FINDINGS  Cardiovascular: Conventional 3 vessel arch anatomy. No significant atherosclerotic plaque in the thoracic aorta. Mild ectasia of the tubular portion of the ascending thoracic aorta with a maximal transverse diameter of 3.7 cm. The aortic valve appears thickened. The valve was much better evaluated on the prior gated cardiac CTA. The heart is normal in size. Mild concentric left ventricular hypertrophy. No pericardial effusion. Scattered calcifications along the coronary arteries.  Mediastinum/Nodes: Unremarkable CT appearance of the thyroid gland. No suspicious mediastinal or hilar adenopathy. No soft tissue mediastinal mass. The thoracic esophagus is unremarkable.  Lungs/Pleura: Lungs are clear. No pleural effusion or pneumothorax.  Musculoskeletal: No acute fracture or aggressive appearing lytic or blastic osseous lesion.  Review of the MIP images confirms the above findings.  CTA ABDOMEN AND PELVIS FINDINGS  VASCULAR  Aorta: Normal caliber aorta without aneurysm, dissection, vasculitis or significant stenosis.  Celiac: Patent without evidence of aneurysm, dissection, vasculitis or significant stenosis.  SMA: Patent without evidence of aneurysm, dissection, vasculitis or significant stenosis.  Renals: Both renal arteries are patent without evidence of aneurysm, dissection, vasculitis, fibromuscular dysplasia or significant stenosis.  IMA: Patent without evidence of aneurysm, dissection, vasculitis or significant stenosis.  Inflow: Patent without evidence of aneurysm, dissection, vasculitis or significant stenosis.  Veins: No focal venous abnormality.  Review of the MIP images confirms the above findings.  NON-VASCULAR  Hepatobiliary: Normal hepatic contour and morphology. No discrete hepatic lesions.  Normal appearance of the gallbladder. No intra or extrahepatic biliary ductal dilatation.  Pancreas: Unremarkable. No pancreatic ductal dilatation or surrounding inflammatory changes.  Spleen: Normal in size without focal abnormality.  Adrenals/Urinary Tract: The adrenal glands are normal. Numerous bilateral nonobstructing renal stones. There are at least 9 stones on the right, and 9 stones on the left. The largest individual stones measure approximately 5-6 mm. No enhancing renal mass. Circumscribed low-attenuation lesions in the left kidney are most consistent with simple cysts. The ureters and bladder are unremarkable.  Stomach/Bowel: Stomach is within normal limits. Appendix appears normal. No evidence of bowel wall thickening, distention, or inflammatory changes.  Lymphatic: No suspicious lymphadenopathy.  Reproductive: Prostate is unremarkable.  Other: No abdominal wall hernia or abnormality. No abdominopelvic ascites.  Musculoskeletal: No acute or significant osseous findings.  Review of the MIP images confirms the  above findings.  IMPRESSION: 1. No evidence of aortic aneurysm, dissection or significant non coronary atherosclerotic plaque. 2. Normal caliber femoral and iliac arteries bilaterally. 3. Thickened aortic valve which was better evaluated on the cardiac gated CTA of the chest dated 08/27/2018. 4. Coronary artery calcifications. 5. No acute cardiopulmonary process. 6. No acute abnormality within the abdomen or pelvis. 7. Numerous bilateral nonobstructing renal stones.  Signed,  Criselda Peaches, MD, [facility]  [facility]   Electronically Signed   By: Jacqulynn Cadet M.D.   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  He was hospitalized in early April with an acute exacerbation of chronic  combined systolic and diastolic congestive heart failure.  He has been clinically stable since then although he continues to experience exertional shortness of breath and chest discomfort consistent with chronic combined systolic and diastolic congestive heart failure, New York Heart Association functional class II-III.  At present his mobility remains significantly limited because of severe pain in his left hip attributed to degenerative arthritis.  Steroid hip injection has been scheduled in the near future.  I have personally reviewed the patient's recent transesophageal echocardiogram, diagnostic cardiac catheterization, and CT angiograms.  The patient has a quadricuspid aortic valve with severe aortic insufficiency and moderate left ventricular systolic dysfunction.  There is no aortic stenosis.  However, there is some mild fibrosis, thickening, and retracted free margins of at least 2 of the 4 leaflets of the valve.  Although it is conceivable that an attempt at valve repair could be performed, it would be difficult to provide a reasonable estimate regarding long-term durability.  The overall size of the aortic valve and aortic root is sufficient that use of subannular ring annuloplasty could potentially improve long-term durability.  I would not attempt valve repair using minimally invasive approach for surgery.  The patient appears to be a reasonable candidate for minimally invasive approach for surgery if valve replacement is planned without any attempt at valve repair.    Plan:  The patient was again counseled at length regarding treatment alternatives for management of severe aortic insufficiency including continued medical therapy versus proceeding with aortic valve repair or replacement in the near future.  The natural history of aortic insufficiency was reviewed, as was long term prognosis with medical therapy alone.  Surgical options were discussed at length including conventional surgical  aortic valve replacement through either a full median sternotomy or using minimally invasive techniques.  Other alternatives including an attempt at valve repair was discussed.  Discussion was held comparing the relative risks of mechanical valve replacement with need for lifelong anticoagulation versus use of a bioprosthetic tissue valve and the associated potential for late structural valve deterioration and failure.  This discussion was placed in the context of the patient's particular circumstances, and as a result the patient specifically requests that his valve be replaced using a bioprosthetic tissue valve.  The patient would rather not proceed with an attempt at valve repair if long-term durability remains uncertain.  He is particularly interested in minimally invasive surgical techniques for valve replacement.  We tentatively plan to proceed with surgery on December 16, 2018.  The patient is currently scheduled to undergo steroid injection of his hip on December 10, 2018.  The patient understands and accepts all potential associated risks of surgery including but not limited to risk of death, stroke, myocardial infarction, congestive heart failure, respiratory  failure, renal failure, pneumonia, bleeding requiring blood transfusion and or reexploration, arrhythmia, heart block or bradycardia requiring permanent pacemaker, aortic dissection or other major vascular complication, pleural effusions or other delayed complications related to continued congestive heart failure, and other late complications related to valve replacement including structural valve deterioration and failure, thrombosis, endocarditis, or paravalvular leak.  The relative risks and benefits of use of a minithoracotomy approach for surgery have been reviewed as they pertain to the patient's specific circumstances, and all of his questions have been addressed.  Specific risks potentially related to the minimally-invasive approach were discussed  at length, including but not limited to risk of conversion to full or partial sternotomy, aortic dissection or other major vascular complication, unilateral acute lung injury or pulmonary edema, phrenic nerve dysfunction or paralysis, rib fracture, chronic pain, lung hernia, or lymphocele.  Expectations for his postoperative convalescence have been discussed.  All questions answered.     I spent in excess of 15 minutes during the conduct of this office consultation and >50% of this time involved direct face-to-face encounter with the patient for counseling and/or coordination of their care.    Valentina Gu. Roxy Manns, MD 11/24/2018 2:02 PM

## 2018-12-01 ENCOUNTER — Other Ambulatory Visit: Payer: Self-pay | Admitting: Cardiology

## 2018-12-10 NOTE — Progress Notes (Signed)
[facility] [address], [place] [address] Phone: [phone] Fax: [phone]  [facility] [place], [place] - [address] [place] [address] Phone: [phone] Fax: [phone]      Your procedure is scheduled on July 21  Report to [facility] Main Entrance "A" at 0530 A.M., and check in at the Admitting office.  Call this number if you have problems the morning of surgery:  [phone]  Call [phone] if you have any questions prior to your surgery date Monday-Friday 8am-4pm    Remember:  Do not eat or drink after midnight the night before your surgery    Take these medicines the morning of surgery with A SIP OF WATER  acetaminophen (TYLENOL) if needed albuterol (PROVENTIL HFA;VENTOLIN HFA) Please bring all inhalers with you the day of surgery.  albuterol (PROVENTIL) if needed amLODipine (NORVASC) budesonide-formoterol (SYMBICORT) fluticasone (FLONASE) COMBIVENT RESPIMAT gabapentin (NEURONTIN) HYDROcodone-acetaminophen (NORCO/VICODIN) if needed hydrOXYzine (VISTARIL)  omeprazole (PRILOSEC   7 days prior to surgery STOP taking any Aspirin (unless otherwise instructed by your surgeon), Aleve, Naproxen, Ibuprofen, Motrin, Advil, Goody's, BC's, all herbal medications, fish oil, and all vitamins.    The Morning of Surgery  Do not wear jewelry, make-up or nail polish.  Do not wear lotions, powders, or perfumes/colognes, or deodorant  Do not shave 48 hours prior to surgery.  Men may shave face and neck.  Do not bring valuables to the hospital.  [facility] is not responsible [facility] belongings or valuables.  If you are a smoker, DO NOT Smoke 24 hours prior to surgery IF you wear a CPAP at night please bring your mask, tubing, and machine the morning of surgery   Remember that you must have someone to transport you home after  your surgery, and remain with you for 24 hours if you are discharged the same day.   Contacts, glasses, hearing aids, dentures or bridgework may not be worn into surgery.    Leave your suitcase in the car.  After surgery it may be brought to your room.  For patients admitted to the hospital, discharge time will be determined by your treatment team.  Patients discharged the day of surgery will not be allowed to drive home.    Special instructions:   [facility]- Preparing For Surgery  Before surgery, you can play an important role. Because skin is not sterile, your skin needs to be as free of germs as possible. You can reduce the number of germs on your skin by washing with CHG (chlorahexidine gluconate) Soap before surgery.  CHG is an antiseptic cleaner which kills germs and bonds with the skin to continue killing germs even after washing.    Oral Hygiene is also important to reduce your risk of infection.  Remember - BRUSH YOUR TEETH THE MORNING OF SURGERY WITH YOUR REGULAR TOOTHPASTE  Please do not use if you have an allergy to CHG or antibacterial soaps. If your skin becomes reddened/irritated stop using the CHG.  Do not shave (including legs and underarms) for at least 48 hours prior to first CHG shower. It is OK to shave your face.  Please follow these instructions carefully.   1. Shower the NIGHT BEFORE SURGERY and the MORNING OF SURGERY with CHG Soap.   2. If you chose to wash your hair, wash your hair first as usual with your normal  shampoo.  3. After you shampoo, rinse your hair and body thoroughly to remove the shampoo.  4. Use CHG as you would any other liquid soap. You can apply CHG directly to the skin and wash gently with a scrungie or a clean washcloth.   5. Apply the CHG Soap to your body ONLY FROM THE NECK DOWN.  Do not use on open wounds or open sores. Avoid contact with your eyes, ears, mouth and genitals (private parts). Wash Face and genitals (private parts)   with your normal soap.   6. Wash thoroughly, paying special attention to the area where your surgery will be performed.  7. Thoroughly rinse your body with warm water from the neck down.  8. DO NOT shower/wash with your normal soap after using and rinsing off the CHG Soap.  9. Pat yourself dry with a CLEAN TOWEL.  10. Wear CLEAN PAJAMAS to bed the night before surgery, wear comfortable clothes the morning of surgery  11. Place CLEAN SHEETS on your bed the night of your first shower and DO NOT SLEEP WITH PETS.    Day of Surgery:  Do not apply any deodorants/lotions. Please shower the morning of surgery with the CHG soap  Please wear clean clothes to the hospital/surgery center.   Remember to brush your teeth WITH YOUR REGULAR TOOTHPASTE.   Please read over the following fact sheets that you were given.

## 2018-12-11 ENCOUNTER — Ambulatory Visit (HOSPITAL_COMMUNITY)
Admission: RE | Admit: 2018-12-11 | Discharge: 2018-12-11 | Disposition: A | Discharge status: Home / Self Care | Payer: No Typology Code available for payment source | Source: Ambulatory Visit | Attending: Thoracic Surgery (Cardiothoracic Vascular Surgery) | Admitting: Thoracic Surgery (Cardiothoracic Vascular Surgery)

## 2018-12-11 ENCOUNTER — Other Ambulatory Visit: Payer: Self-pay

## 2018-12-11 ENCOUNTER — Encounter (HOSPITAL_COMMUNITY)
Admission: RE | Admit: 2018-12-11 | Discharge: 2018-12-11 | Disposition: A | Discharge status: Home / Self Care | Payer: No Typology Code available for payment source | Source: Ambulatory Visit | Attending: Thoracic Surgery (Cardiothoracic Vascular Surgery) | Admitting: Thoracic Surgery (Cardiothoracic Vascular Surgery)

## 2018-12-11 ENCOUNTER — Encounter (HOSPITAL_COMMUNITY): Payer: Self-pay

## 2018-12-11 ENCOUNTER — Other Ambulatory Visit (HOSPITAL_COMMUNITY): Payer: No Typology Code available for payment source

## 2018-12-11 DIAGNOSIS — I351 Nonrheumatic aortic (valve) insufficiency: Secondary | ICD-10-CM

## 2018-12-11 HISTORY — DX: Heart failure, unspecified: I50.9

## 2018-12-11 HISTORY — DX: Chronic kidney disease, unspecified: N18.9

## 2018-12-11 HISTORY — DX: Unspecified osteoarthritis, unspecified site: M19.90

## 2018-12-11 LAB — URINALYSIS, ROUTINE W REFLEX MICROSCOPIC
Glucose, UA: NEGATIVE mg/dL
Hgb urine dipstick: NEGATIVE
Ketones, ur: NEGATIVE mg/dL
Leukocytes,Ua: NEGATIVE
Nitrite: NEGATIVE
Protein, ur: NEGATIVE mg/dL
Specific Gravity, Urine: 1.03 (ref 1.005–1.030)
pH: 5 (ref 5.0–8.0)

## 2018-12-11 LAB — COMPREHENSIVE METABOLIC PANEL
ALT: 12 U/L (ref 0–44)
AST: 15 U/L (ref 15–41)
Albumin: 4 g/dL (ref 3.5–5.0)
Alkaline Phosphatase: 59 U/L (ref 38–126)
Anion gap: 13 (ref 5–15)
BUN: 23 mg/dL (ref 8–23)
CO2: 18 mmol/L — ABNORMAL LOW (ref 22–32)
Calcium: 9.3 mg/dL (ref 8.9–10.3)
Chloride: 109 mmol/L (ref 98–111)
Creatinine, Ser: 1.74 mg/dL — ABNORMAL HIGH (ref 0.61–1.24)
GFR calc Af Amer: 48 mL/min — ABNORMAL LOW (ref >60)
GFR calc non Af Amer: 41 mL/min — ABNORMAL LOW (ref >60)
Glucose, Bld: 124 mg/dL — ABNORMAL HIGH (ref 70–99)
Potassium: 3.8 mmol/L (ref 3.5–5.1)
Sodium: 140 mmol/L (ref 135–145)
Total Bilirubin: 1.3 mg/dL — ABNORMAL HIGH (ref 0.3–1.2)
Total Protein: 7.2 g/dL (ref 6.5–8.1)

## 2018-12-11 LAB — BLOOD GAS, ARTERIAL
Acid-base deficit: 1.4 mmol/L (ref 0.0–2.0)
Bicarbonate: 21.7 mmol/L (ref 20.0–28.0)
Drawn by: 42180
FIO2: 21
O2 Saturation: 99 %
Patient temperature: 98.6
pCO2 arterial: 30.1 mmHg — ABNORMAL LOW (ref 32.0–48.0)
pH, Arterial: 7.472 — ABNORMAL HIGH (ref 7.350–7.450)
pO2, Arterial: 124 mmHg — ABNORMAL HIGH (ref 83.0–108.0)

## 2018-12-11 LAB — SURGICAL PCR SCREEN
MRSA, PCR: NEGATIVE
Staphylococcus aureus: NEGATIVE

## 2018-12-11 LAB — CBC
HCT: 31.8 % — ABNORMAL LOW (ref 39.0–52.0)
Hemoglobin: 9.5 g/dL — ABNORMAL LOW (ref 13.0–17.0)
MCH: 24.2 pg — ABNORMAL LOW (ref 26.0–34.0)
MCHC: 29.9 g/dL — ABNORMAL LOW (ref 30.0–36.0)
MCV: 81.1 fL (ref 80.0–100.0)
Platelets: 328 10*3/uL (ref 150–400)
RBC: 3.92 MIL/uL — ABNORMAL LOW (ref 4.22–5.81)
RDW: 13.9 % (ref 11.5–15.5)
WBC: 5.6 10*3/uL (ref 4.0–10.5)
nRBC: 0 % (ref 0.0–0.2)

## 2018-12-11 LAB — HEMOGLOBIN A1C
Hgb A1c MFr Bld: 5.5 % (ref 4.8–5.6)
Mean Plasma Glucose: 111.15 mg/dL

## 2018-12-11 LAB — PROTIME-INR
INR: 1.1 (ref 0.8–1.2)
Prothrombin Time: 14 seconds (ref 11.4–15.2)

## 2018-12-11 LAB — ABO/RH: ABO/RH(D): O POS

## 2018-12-11 LAB — APTT: aPTT: 42 seconds — ABNORMAL HIGH (ref 24–36)

## 2018-12-11 NOTE — Progress Notes (Addendum)
PCP -  Cardiologist - Dr Virgina Jock  Chest x-ray - 12/11/18 EKG - 12/11/18 Stress Test -  ECHO - 6/20 Cardiac Cath - 5/20  Sleep Study - yes CPAP - yes  Fasting Blood Sugar - na Checks Blood Sugar _____ times a day  Blood Thinner Instructions:stop nsaids Aspirin Instructions: conti  Anesthesia review: heart hx  Patient denies shortness of breath, fever, cough and chest pain at PAT appointment   Patient verbalized understanding of instructions that were given to them at the PAT appointment. Patient was also instructed that they will need to review over the PAT instructions again at home before surgery.

## 2018-12-11 NOTE — Progress Notes (Signed)
Pre-MVR Dopplers completed. Refer to "CV Proc" under chart review to view preliminary results.  12/11/2018 11:05 AM Maudry Mayhew, MHA, RVT, RDCS, RDMS

## 2018-12-12 ENCOUNTER — Other Ambulatory Visit (HOSPITAL_COMMUNITY)
Admission: RE | Admit: 2018-12-12 | Discharge: 2018-12-12 | Disposition: A | Discharge status: Home / Self Care | Payer: No Typology Code available for payment source | Source: Ambulatory Visit | Attending: Thoracic Surgery (Cardiothoracic Vascular Surgery) | Admitting: Thoracic Surgery (Cardiothoracic Vascular Surgery)

## 2018-12-12 ENCOUNTER — Encounter (HOSPITAL_COMMUNITY): Payer: Self-pay

## 2018-12-12 DIAGNOSIS — I351 Nonrheumatic aortic (valve) insufficiency: Secondary | ICD-10-CM

## 2018-12-12 DIAGNOSIS — Z1159 Encounter for screening for other viral diseases: Secondary | ICD-10-CM | POA: Insufficient documentation

## 2018-12-12 LAB — SARS CORONAVIRUS 2 (TAT 6-24 HRS): SARS Coronavirus 2: NEGATIVE

## 2018-12-13 ENCOUNTER — Other Ambulatory Visit (HOSPITAL_COMMUNITY): Payer: No Typology Code available for payment source

## 2018-12-15 ENCOUNTER — Ambulatory Visit (HOSPITAL_COMMUNITY)
Admission: RE | Admit: 2018-12-15 | Discharge: 2018-12-15 | Disposition: A | Discharge status: Home / Self Care | Payer: No Typology Code available for payment source | Source: Ambulatory Visit | Attending: Thoracic Surgery (Cardiothoracic Vascular Surgery) | Admitting: Thoracic Surgery (Cardiothoracic Vascular Surgery)

## 2018-12-15 ENCOUNTER — Other Ambulatory Visit: Payer: Self-pay

## 2018-12-15 ENCOUNTER — Encounter (HOSPITAL_COMMUNITY): Payer: Self-pay | Admitting: Certified Registered Nurse Anesthetist

## 2018-12-15 DIAGNOSIS — N189 Chronic kidney disease, unspecified: Secondary | ICD-10-CM | POA: Diagnosis present

## 2018-12-15 DIAGNOSIS — I351 Nonrheumatic aortic (valve) insufficiency: Secondary | ICD-10-CM | POA: Insufficient documentation

## 2018-12-15 LAB — PULMONARY FUNCTION TEST
DL/VA % pred: 96 %
DL/VA: 4.05 ml/min/mmHg/L
DLCO cor % pred: 89 %
DLCO cor: 25.3 ml/min/mmHg
DLCO unc % pred: 73 %
DLCO unc: 20.72 ml/min/mmHg
FEF 25-75 Post: 1.87 L/sec
FEF 25-75 Pre: 1.34 L/sec
FEF2575-%Change-Post: 39 %
FEF2575-%Pred-Post: 63 %
FEF2575-%Pred-Pre: 45 %
FEV1-%Change-Post: 10 %
FEV1-%Pred-Post: 83 %
FEV1-%Pred-Pre: 76 %
FEV1-Post: 2.7 L
FEV1-Pre: 2.45 L
FEV1FVC-%Change-Post: 0 %
FEV1FVC-%Pred-Pre: 83 %
FEV6-%Change-Post: 7 %
FEV6-%Pred-Post: 97 %
FEV6-%Pred-Pre: 90 %
FEV6-Post: 3.92 L
FEV6-Pre: 3.63 L
FEV6FVC-%Change-Post: 0 %
FEV6FVC-%Pred-Post: 100 %
FEV6FVC-%Pred-Pre: 100 %
FVC-%Change-Post: 9 %
FVC-%Pred-Post: 99 %
FVC-%Pred-Pre: 90 %
FVC-Post: 4.11 L
FVC-Pre: 3.75 L
Post FEV1/FVC ratio: 66 %
Post FEV6/FVC ratio: 97 %
Pre FEV1/FVC ratio: 65 %
Pre FEV6/FVC Ratio: 97 %
RV % pred: 128 %
RV: 2.99 L
TLC % pred: 96 %
TLC: 6.94 L

## 2018-12-15 MED ORDER — DEXMEDETOMIDINE HCL IN NACL 400 MCG/100ML IV SOLN
0.1000 ug/kg/h | INTRAVENOUS | Status: AC
  Administered 2018-12-16: .7 ug/kg/h via INTRAVENOUS
  Filled 2018-12-15: qty 100

## 2018-12-15 MED ORDER — SODIUM CHLORIDE 0.9 % IV SOLN
INTRAVENOUS | Status: DC
  Filled 2018-12-15: qty 30

## 2018-12-15 MED ORDER — EPINEPHRINE PF 1 MG/ML IJ SOLN
0.0000 ug/min | INTRAVENOUS | Status: DC
  Filled 2018-12-15: qty 4

## 2018-12-15 MED ORDER — NITROGLYCERIN IN D5W 200-5 MCG/ML-% IV SOLN
2.0000 ug/min | INTRAVENOUS | Status: DC
  Filled 2018-12-15: qty 250

## 2018-12-15 MED ORDER — MILRINONE LACTATE IN DEXTROSE 20-5 MG/100ML-% IV SOLN
0.3000 ug/kg/min | INTRAVENOUS | Status: DC
  Filled 2018-12-15: qty 100

## 2018-12-15 MED ORDER — VANCOMYCIN HCL 1000 MG IV SOLR
INTRAVENOUS | Status: DC
  Filled 2018-12-15: qty 1000

## 2018-12-15 MED ORDER — DOPAMINE-DEXTROSE 3.2-5 MG/ML-% IV SOLN
0.0000 ug/kg/min | INTRAVENOUS | Status: DC
  Filled 2018-12-15: qty 250

## 2018-12-15 MED ORDER — POTASSIUM CHLORIDE 2 MEQ/ML IV SOLN
80.0000 meq | INTRAVENOUS | Status: DC
  Filled 2018-12-15: qty 40

## 2018-12-15 MED ORDER — PLASMA-LYTE 148 IV SOLN
INTRAVENOUS | Status: DC
  Filled 2018-12-15: qty 2.5

## 2018-12-15 MED ORDER — SODIUM CHLORIDE 0.9 % IV SOLN
1.5000 g | INTRAVENOUS | Status: AC
  Administered 2018-12-16: 1.5 g via INTRAVENOUS
  Administered 2018-12-16: .75 g via INTRAVENOUS
  Filled 2018-12-15: qty 1.5

## 2018-12-15 MED ORDER — ALBUTEROL SULFATE (2.5 MG/3ML) 0.083% IN NEBU
2.5000 mg | INHALATION_SOLUTION | Freq: Once | RESPIRATORY_TRACT | Status: AC
  Administered 2018-12-15: 2.5 mg via RESPIRATORY_TRACT

## 2018-12-15 MED ORDER — VANCOMYCIN HCL 10 G IV SOLR
1500.0000 mg | INTRAVENOUS | Status: AC
  Administered 2018-12-16: 1500 mg via INTRAVENOUS
  Filled 2018-12-15: qty 1500

## 2018-12-15 MED ORDER — MANNITOL 20 % IV SOLN
INTRAVENOUS | Status: DC
  Filled 2018-12-15 (×2): qty 13

## 2018-12-15 MED ORDER — INSULIN REGULAR(HUMAN) IN NACL 100-0.9 UT/100ML-% IV SOLN
INTRAVENOUS | Status: AC
  Administered 2018-12-16: 08:00:00 1 [IU]/h via INTRAVENOUS
  Filled 2018-12-15: qty 100

## 2018-12-15 MED ORDER — PHENYLEPHRINE HCL-NACL 20-0.9 MG/250ML-% IV SOLN
30.0000 ug/min | INTRAVENOUS | Status: AC
  Administered 2018-12-16: 15 ug/min via INTRAVENOUS
  Filled 2018-12-15: qty 250

## 2018-12-15 MED ORDER — SODIUM CHLORIDE 0.9 % IV SOLN
750.0000 mg | INTRAVENOUS | Status: DC
  Filled 2018-12-15: qty 750

## 2018-12-15 MED ORDER — TRANEXAMIC ACID (OHS) PUMP PRIME SOLUTION
2.0000 mg/kg | INTRAVENOUS | Status: DC
  Filled 2018-12-15: qty 2.23

## 2018-12-15 MED ORDER — TRANEXAMIC ACID (OHS) BOLUS VIA INFUSION
15.0000 mg/kg | INTRAVENOUS | Status: AC
  Administered 2018-12-16: 1671 mg via INTRAVENOUS
  Filled 2018-12-15: qty 1671

## 2018-12-15 MED ORDER — TRANEXAMIC ACID 1000 MG/10ML IV SOLN
1.5000 mg/kg/h | INTRAVENOUS | Status: AC
  Administered 2018-12-16: 1.5 mg/kg/h via INTRAVENOUS
  Filled 2018-12-15: qty 25

## 2018-12-15 NOTE — H&P (Signed)
[address]       [place] [address]             [phone]          CARDIOTHORACIC SURGERY HISTORY AND PHYSICAL EXAM  Referring Provider is [name], Reynold Bowen, MD PCP is Administration, [employer] Complaint  Patient presents with   Aortic Insuffiency    discuss surgery    HPI:  Patient is a 63 year old African-American male with history of quadricuspid aortic valve and severe aortic insufficiency, hypertension, left bundle branch block, asthma, obstructive sleep apnea, abnormal stress test and remote history of tobacco use who returns to the office today for follow-up discussion regarding treatment options for management of severe aortic insufficiency.  Patient states that he has had some degree of exertional shortness of breath that has slowly progressed for years. He denies any previous history of heart murmur or rheumatic fever. Over the past year symptoms of exertional shortness of breath progressed, and approximately 3 months ago he developed more severe episodes of exertional shortness of breath with occasional paroxysmal episodes of resting shortness of breath. Initially he was thought to be suffering from asthma. However, he began to experience lower extremity edema and orthopnea. He was referred for cardiology consultation and seen by Dr. Kandis Ban early February. An echocardiogram was performed that reportedly demonstrated the presence of severe aortic insufficiency with normal left ventricular size and systolic function. Paradoxically, a stress test was performed and reported to be high risk due to the presence of low ejection fraction. He was notably hypertensive at the time but reportedly not experiencing active symptoms of chest pain or shortness of breath. The patient was started on nifedipine and Lasix at that time.   The patient states that he took his medications for approximately 2 weeks but subsequently stopped  because he could not afford to fill the prescriptions. Initially he did well but after he stopped taking his medications he began to experience worsening shortness of breath. Last week he began to experience lower extremity edema. He resumed taking Lasix 2 days ago. Early this morning the patient was awoken from sleep with burning substernal chest discomfort and shortness of breath. He was brought via EMS to the emergency department where baseline EKG revealed sinus rhythm with left bundle branch block. Chest x-ray revealed mild pulmonary edema. BNP was elevated 300. Initial troponin was negative. Oxygen saturation was in the 80s. Symptoms improved with IV Lasix. He was admitted to the hospital for observation and follow-up troponin level was slightly increased to 0.37from <0.03 at baseline.Cardiothoracic surgical consultation was requested and I initially saw the patient in consultation on August 27, 2018.  Cardiac gated CT angiogram was performed and confirmed the presence of a quadricuspid aortic valve and revealed what was felt to be mild nonobstructive coronary artery disease, although images were suboptimal and visualization of the right coronary artery was limited.  Clinically the patient improved rapidly with intravenous diuresis and he was ultimately discharged home with plans for close medical follow-up and diagnostic cardiac catheterization at a delayed date because of the ongoing COVID-19 pandemic.  The patient subsequently underwent left and right heart catheterization on Oct 14, 2018.  He was found to have normal coronary arteries with no significant coronary artery disease.  There was mild pulmonary hypertension.  The patient was seen in consultation by Dr. Dorothyann Gibbs in the dental clinic and ultimately underwent dental extraction by an oral surgeon.  He has been cleared to proceed with aortic valve surgery.  He returns to our office today for follow-up.  Patient is married and lives  locally in [place] with his wife. He works as a Administrator. He states that he still exercises on a regular basis, but this is primarily weight training. He notes that he had to give up any kind of aerobic exercise several years ago because of exertional shortness of breath. He gets short of breath with low-level aerobic activity. He has had some substernal chest discomfort with strenuous exertion.   He reports that since hospital discharge he has remained essentially stable, although he did go to the emergency department on Oct 19, 2018 complaining of mild tightness across his chest.  He was treated and released.  He now gets short of breath with moderate level activity.  He has not had palpitations, dizzy spells, nor syncope. He denies any recent febrile illness or cough. 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 He has not been exposed to any persons who have known or suspected COVID-19 infection.  His biggest complaint at present is that of acute exacerbation of pain in his right hip that is reportedly related to degenerative arthritis.  He has been told that he will likely need right hip replacement at some point.  He has been scheduled for possible cortisone injection of his hip but this has not yet been performed.  Patient is a 63 year old African-American male with history of quadricuspid aortic valve and severe aortic insufficiency, hypertension, left bundle branch block, asthma, obstructive sleep apnea, and remote history of tobacco use who returns to the office today for follow-up of severe symptomatic aortic insufficiency.  He was initially seen in consultation on August 27, 2018 and he was last seen here in our office on November 03, 2018.  Since then he underwent transesophageal echocardiogram to further evaluate whether or not an attempt at valve repair might be feasible.  He also underwent CT angiography to evaluate the feasibility of peripheral cannulation for  surgery.  He has been referred for steroid injection of his left hip because of severe degenerative arthritis with ongoing pain, and hip injection has been scheduled for December 10, 2018.  He returns to the office today to review the results of his tests and hopefully schedule surgery soon after his hip injection has been performed with intention that improved pain relief related to his hip will facilitate his postoperative recovery following surgery.  He reports no new problems or complaints.  He remains severely limited by pain in his left hip.  He remains ambulatory but it is really bothering him considerably.  He understands that he will likely need hip replacement once his aortic valve is been definitively treated.  The remainder of his review of systems is unchanged.   Past Medical History:  Diagnosis Date   Acute on chronic diastolic heart failure (HCC)    Anxiety    Aortic insufficiency    Arthritis    CHF (congestive heart failure) (HCC)    CKD (chronic kidney disease)    Essential hypertension    Nerve pain    Non-rheumatic aortic regurgitation    Obstructive sleep apnea    cpap   Quadricuspid aortic valve    Severe aortic insufficiency    SOB (shortness of breath)     Past Surgical History:  Procedure Laterality Date   HERNIA REPAIR     RIGHT/LEFT HEART CATH AND CORONARY ANGIOGRAPHY  N/A 10/14/2018   Procedure: RIGHT/LEFT HEART CATH AND CORONARY ANGIOGRAPHY;  Surgeon: Nigel Mormon, MD;  Location: [facility];  Service: Cardiovascular;  Laterality: N/A;   TEE WITHOUT CARDIOVERSION N/A 11/11/2018   Procedure: TRANSESOPHAGEAL ECHOCARDIOGRAM (TEE);  Surgeon: Nigel Mormon, MD;  Location: [facility];  Service: Cardiovascular;  Laterality: N/A;    Family History  Problem Relation Age of Onset   COPD Father    Cancer Brother     Social History Social History   Tobacco Use   Smoking status: Former Smoker    Types: Cigarettes    Smokeless tobacco: Never Used   Tobacco comment: 30 PY  Substance Use Topics   Alcohol use: No   Drug use: No    Prior to Admission medications   Medication Sig Start Date End Date Taking? Authorizing Provider  acetaminophen (TYLENOL) 500 MG tablet Take 1,000 mg by mouth daily as needed for mild pain or moderate pain.     [provider]  albuterol (PROVENTIL HFA;VENTOLIN HFA) 108 (90 Base) MCG/ACT inhaler Inhale 2 puffs into the lungs every 6 (six) hours as needed for wheezing or shortness of breath.    [provider]  albuterol (PROVENTIL) (2.5 MG/3ML) 0.083% nebulizer solution Take 3 mLs (2.5 mg total) by nebulization every 6 (six) hours as needed for wheezing or shortness of breath. 11/14/17   Noemi Chapel, MD  amLODipine (NORVASC) 10 MG tablet Take 1 tablet (10 mg total) by mouth daily. 09/04/18   [name], Reynold Bowen, MD  aspirin EC 81 MG tablet Take 81 mg by mouth daily.    [provider]  budesonide-formoterol (SYMBICORT) 160-4.5 MCG/ACT inhaler Inhale 2 puffs into the lungs 2 (two) times daily.    [provider]  COMBIVENT RESPIMAT 20-100 MCG/ACT AERS respimat Inhale 2 puffs into the lungs daily as needed for shortness of breath. 12/10/16   Law, Bea Graff, PA-C  dextromethorphan-guaiFENesin (MUCINEX DM) 30-600 MG 12hr tablet Take 1 tablet by mouth 2 (two) times daily.    [provider]  fluticasone (FLONASE) 50 MCG/ACT nasal spray Place 2 sprays into both nostrils daily as needed for allergies or rhinitis.    [provider]  furosemide (LASIX) 20 MG tablet Take 2 tablets (40 mg total) by mouth 2 (two) times daily for 30 days. 11/24/18 12/24/18  Rexene Alberts, MD  gabapentin (NEURONTIN) 600 MG tablet Take 600 mg by mouth 3 (three) times daily.    [provider]  HYDROcodone-acetaminophen (NORCO/VICODIN) 5-325 MG tablet Take 1 tablet by mouth every 4 (four) hours as needed for moderate pain.    [provider]   hydrOXYzine (VISTARIL) 25 MG capsule Take 25 mg by mouth 3 (three) times daily.    [provider]  montelukast (SINGULAIR) 10 MG tablet Take 10 mg by mouth every evening.    [provider]  nitroGLYCERIN (NITROSTAT) 0.4 MG SL tablet Place 1 tablet (0.4 mg total) under the tongue every 5 (five) minutes as needed for chest pain. 08/28/18   [name], Reynold Bowen, MD  omeprazole (PRILOSEC) 40 MG capsule TAKE ONE CAPSULE BY MOUTH DAILY 12/01/18   [name], Manish J, MD  rosuvastatin (CRESTOR) 10 MG tablet Take 1 tablet (10 mg total) by mouth daily at 6 PM. 08/28/18   [name], Manish J, MD    No Known Allergies    Review of Systems:  General:normalappetite, normalenergy, noweight gain, noweight loss, nofever Cardiac:+chest pain with exertion, +chest pain at rest, +SOB  with exertion,noresting SOB, noPND, +orthopnea, nopalpitations, noarrhythmia, noatrial fibrillation, +LE edema, nodizzy spells, nosyncope Respiratory:+shortness of breath, nohome oxygen, noproductive cough, nodry cough, nobronchitis, nowheezing, nohemoptysis, noasthma, nopain with inspiration or cough, +sleep apnea, noCPAP at night IR:[id number] swallowing, noreflux, nofrequent heartburn, nohiatal hernia, noabdominal pain, noconstipation, nodiarrhea, nohematochezia, nohematemesis, nomelena PP:JKDTOIZTI, nofrequency, nourinary tract infection, nohematuria, noenlarged prostate, nokidney stones, nokidney disease Vascular:nopain suggestive of claudication, nopain in feet, noleg cramps, novaricose veins, noDVT, nonon-healing foot ulcer Neuro:nostroke, noTIA's, noseizures, noheadaches, notemporary blindness one eye,  noslurred speech, noperipheral neuropathy, +chronic pain, noinstability of gait, nomemory/cognitive dysfunction Musculoskeletal:+arthritis - primarily involving the right hip, nojoint swelling, nomyalgias, nodifficulty walking, normalmobility  Skin:norash, noitching, noskin infections, nopressure sores or ulcerations Psych:noanxiety, nodepression, nonervousness, nounusual recent stress Eyes:noblurry vision, nofloaters, norecent vision changes, +wears glasses for reading WPY:KDXIPJASN loss, noloose or painful teeth Hematologic:noeasy bruising, noabnormal bleeding, noclotting disorder, nofrequent epistaxis Endocrine:nodiabetes, does not check CBG's at home                           Physical Exam:              BP 111/60 (BP Location: Right Arm, Patient Position: Sitting, Cuff Size: Large)    Pulse 90    Temp 98.1 F (36.7 C) (Skin)    Resp 16    Ht 5\' 11"  (1.803 m)    Wt 264 lb (119.7 kg)    SpO2 98% Comment: ON RA   BMI 36.82 kg/m              General:                      Moderately obese,  well-appearing             HEENT:                       Unremarkable              Neck:                           no JVD, no bruits, no adenopathy              Chest:                          clear to auscultation, symmetrical breath sounds, no wheezes, no rhonchi              CV:                              RRR, grade II/VI diastolic murmur              Abdomen:                    soft, non-tender, no masses              Extremities:                 warm, well-perfused, pulses palpable, no LE edema             Rectal/GU                   Deferred             Neuro:  Grossly non-focal and symmetrical throughout              Skin:                            Clean and dry, no rashes, no breakdown   Diagnostic Tests:  ECHOCARDIOGRAM LIMITED REPORT     Patient Name: MATTHEO SWINDLE Date of Exam: 08/27/2018 Medical Rec #: [id number] Height: 71.0 in Accession #: [id number] Weight: 254.5 lb Date of Birth: 1956/04/13 BSA: 2.34 m Patient Age: 21 years BP: 126/58 mmHg Patient Gender: M HR: 85 bpm. Exam Location: Inpatient   Procedure: 2D Echo, Cardiac Doppler and Color Doppler  Indications: AV insufficiency 424.10 / I35.9  History: Patient has prior history of Echocardiogram examinations, most recent 07/23/2018. Diabetes, Asthma.  Sonographer: Madelaine Etienne RDCS (AE) Referring Phys: [id number] [facility] [name] Diagnosing Phys: Vernell Leep MD  IMPRESSIONS   1. The left ventricle has low normal systolic function, with an ejection fraction of 50-55%. The cavity size was mildly dilated. 2. The right ventricle has normal systolc function. The cavity was normal. 3. The asending aorta measures upper limit of normal at 3.7 cm. No dissection seen. 4. Trieleaflet aortic valve with mild calcification. Severe aortic valve regurgitation. 5. Compared to previous study in 06/2018, LV is now mildly dilated.  FINDINGS Left Ventricle: The left ventricle has low normal systolic function, with an ejection fraction of 50-55%. The cavity size was mildly dilated.  Right Ventricle: The right ventricle has normal systolic function. The cavity was normal. Left Atrium: Left atrial size was normal in size. Left Atrial Appendage: Right Atrium: Right atrial size was normal in size. Pericardium: There is no evidence of pericardial effusion. Mitral Valve: The mitral valve is normal in structure. Tricuspid Valve: The tricuspid valve was normal in structure. Tricuspid valve regurgitation  was not visualized by color flow Doppler. Aortic Valve: The aortic valve is tricuspid Mild calcification of the aortic valve. Aortic valve regurgitation is severe by color flow Doppler. There is no evidence of aortic valve stenosis. Pulmonic Valve: The pulmonic valve was grossly normal. Aorta: The asending aorta measures upper limit of normal at 3.7 cm. No dissection seen. is normal in size and structure.  LEFT VENTRICLE PLAX 2D LVIDd: 5.40 cm LVIDs: 3.80 cm LV PW: 1.00 cm LV IVS: 0.90 cm LV SV: 79 ml LV SV Index: 32.45  LV Volumes (MOD) LV area d, A2C: 60.50 cm LV area d, A4C: 49.00 cm LV area s, A2C: 41.00 cm LV area s, A4C: 37.50 cm LV major d, A2C: 10.20 cm LV major d, A4C: 9.41 cm LV major s, A2C: 9.40 cm LV major s, A4C: 8.85 cm LV vol d, MOD A2C: 293.0 ml LV vol d, MOD A4C: 207.0 ml LV vol s, MOD A2C: 152.0 ml LV vol s, MOD A4C: 133.0 ml LV SV MOD A2C: 141.0 ml LV SV MOD A4C: 207.0 ml LV SV MOD BP: 108.9 ml  LEFT ATRIUM Index LA diam: 3.00 cm 1.28 cm/m AORTIC VALVE AV Vmax: 240.67 cm/s AV Vmean: 163.333 cm/s AV VTI: 0.451 m AV Peak Grad: 23.2 mmHg AV Mean Grad: 12.3 mmHg LVOT Vmax: 151.00 cm/s LVOT Vmean: 105.000 cm/s LVOT VTI: 0.305 m LVOT/AV VTI ratio: 0.68 AR PHT: 553 msec  AORTA Ao Root diam: 3.20 cm Ao Asc diam: 3.70 cm   SHUNTS Systemic VTI: 0.30 m   Vernell Leep MD Electronically signed by Vernell Leep MD Signature Date/Time: 08/27/2018/6:52:37  PM    RIGHT/LEFT HEART CATH AND CORONARY ANGIOGRAPHY  Conclusion   LM: Normal LAD: Normal Ramus: Normal LCx: Normal RCA: Normal  Angiographically normal coronary arteries with no significant CAD Mild pulmonary hypertnension mean PA 26 mmHg  Recommendation: Follow up with Dr. Roxy Manns re: aortic valve  replacement  Nigel Mormon, MD [facility] Pager: [phone] Office: [phone] If no answer Cell [phone]    Recommendations   Antiplatelet/Anticoag In light of recent NSTEMI and anticipated aortic valve replacement.  Procedural Details   Technical Details Procedures: 1. Right heart catheterization 2. Left heart catheterization 3. Selective left and right coronary angiography 4. Conscious sedation monitoring 36 min  Indication: Severe aortic regurgitation  History: 63 year old African-American male, 38 PY former smoker, OSA, LBBB, h/o asthma, severe AI with quadricuspid aortic valve.  Diagnostic Angiography: Catheter/s advances over guidewire under fluoroscopy Left coronary artery: 5 Fr JL 3.5  Right coronary artery: 5 Fr JR 4 Left heart catheterization: 5 Fr JR 4  Pressures tracings obtained in right atrium, right ventricle, pulmonary artery, and pulmonary capillary wedge position.   Anticoagulation:  6000 units heparin  Hemostasis: TR band  Total contrast used: 45 cc   Total fluoro time: 5.0 min Air Kerma: 754 mGy  All wires and catheters removed out of the body at the end of the procedure Final angiogram showed no dissection/perforation         Estimated blood loss <50 mL.   During this procedure medications were administered to achieve and maintain moderate conscious sedation while the patient's heart rate, blood pressure, and oxygen saturation were continuously monitored and I was present face-to-face 100% of this time.  Medications  (Filter: Administrations occurring from 10/14/18 1009 to 10/14/18 1114)          Medication Rate/Dose/Volume Action  Date Time   Heparin (Porcine) in NaCl 1000-0.9 UT/500ML-% SOLN (mL) 500 mL Given 10/14/18 1020   Total dose as of 11/03/18 1631        500 mL        Heparin (Porcine) in NaCl 1000-0.9 UT/500ML-% SOLN (mL) 500 mL Given 10/14/18 1020   Total dose as  of 11/03/18 1631        500 mL        fentaNYL (SUBLIMAZE) injection (mcg) 50 mcg Given 10/14/18 1028   Total dose as of 11/03/18 1631 50 mcg Given 1038   100 mcg        midazolam (VERSED) injection (mg) 1 mg Given 10/14/18 1028   Total dose as of 11/03/18 1631        1 mg        lidocaine (PF) (XYLOCAINE) 1 % injection (mL) 5 mL Given 10/14/18 1035   Total dose as of 11/03/18 1631        5 mL        Radial Cocktail/Verapamil only (mL) 10 mL Given 10/14/18 1041   Total dose as of 11/03/18 1631        10 mL        heparin injection (Units) 6,000 Units Given 10/14/18 1048   Total dose as of 11/03/18 1631        6,000 Units        iohexol (OMNIPAQUE) 350 MG/ML injection (mL) 45 mL Given 10/14/18 1106   Total dose as of 11/03/18 1631        45 mL        Sedation Time   Sedation Time Physician-1: 36 minutes 39  seconds  Complications   Complications documented before study signed (10/14/2018 11:17 AM EDT)    RIGHT/LEFT HEART CATH AND CORONARY ANGIOGRAPHY   None Documented by Nigel Mormon, MD 10/14/2018 11:12 AM EDT  Time Range: Intraprocedure      Coronary Findings   Diagnostic  Dominance: Right  Left Main  Vessel is normal in caliber. Vessel is angiographically normal.  Left Anterior Descending  Vessel is normal in caliber. Vessel is angiographically normal.  Ramus Intermedius  Vessel is normal in caliber. Vessel is angiographically normal.  Left Circumflex  Vessel is normal in caliber. Vessel is angiographically normal.  Right Coronary Artery  Vessel is normal in caliber. Vessel is angiographically normal.  Intervention   No interventions have been documented.  Right Heart   Right Heart Pressures RA: 7 mmHg RV: 36/6 mmHg PA: 36/6 mmHg. Mean PA 26 mmHg PCWP: 14 mmHg LVEDP `19 mmHg  Left Heart   Left Ventricle LV end diastolic pressure is mildly elevated.  Coronary  Diagrams   Diagnostic  Dominance: Right    Intervention   Implants       No implant documentation for this case.  Syngo Images   Show images for CARDIAC CATHETERIZATION  Images on Long Term Storage   Show images for Azariel, Banik.   Link to Procedure Log   Procedure Log    Hemo Data    Most Recent Value  Fick Cardiac Output 7.28 L/min  Fick Cardiac Output Index 3.07 (L/min)/BSA  RA A Wave 10 mmHg  RA V Wave 8 mmHg  RA Mean 7 mmHg  RV Systolic Pressure 35 mmHg  RV Diastolic Pressure 4 mmHg  RV EDP 10 mmHg  PA Systolic Pressure 37 mmHg  PA Diastolic Pressure 12 mmHg  PA Mean 26 mmHg  PW A Wave 17 mmHg  PW V Wave 15 mmHg  PW Mean 14 mmHg  AO Systolic Pressure 765 mmHg  AO Diastolic Pressure 74 mmHg  AO Mean 465 mmHg  LV Systolic Pressure 035 mmHg  LV Diastolic Pressure 9 mmHg  LV EDP 19 mmHg  AOp Systolic Pressure 465 mmHg  AOp Diastolic Pressure 70 mmHg  AOp Mean Pressure 681 mmHg  LVp Systolic Pressure 275 mmHg  LVp Diastolic Pressure 12 mmHg  LVp EDP Pressure 29 mmHg  QP/QS 1  TPVR Index 10.44 HRUI  TSVR Index 34.59 HRUI  PVR SVR Ratio 0.2  TPVR/TSVR Ratio 0.3      Procedure: Transesophageal Echo  Indications: Aortic valve disorder 424.1 / I35.9  History: Patient has prior history of Echocardiogram examinations, most recent 08/27/2018. Quadricuspid aortic valve and Aortic Valve Disease Signs/Symptoms: Chest Pain Risk Factors: Hypertension and Diabetes. Obstructive sleep apnea COCAINE ABUSE, HX OF.  Sonographer: Talmage Coin Referring Phys: [id number] [facility] J [name] Diagnosing Phys: Vernell Leep MD Report CC'd to: Vernell Leep MD    PROCEDURE: Local oropharyngeal anesthetic was provided with Benzocaine spray. Patients was under conscious sedation during this procedure. Anesthetic was administered intravenously by performing  Physician: 48mcg of Fentanyl, 1.0mg  of Versed. The  transesophogeal probe was passed through the esophogus of the patient. Image quality was good. The patient's vital signs; including heart rate, blood pressure, and oxygen saturation; remained stable throughout the procedure. The patient developed no  complications during the procedure.  IMPRESSIONS   1. The left ventricle has mildly reduced systolic function, with an ejection fraction of 45-50%. The cavity size was moderately dilated. No evidence of left ventricular regional wall motion abnormalities. 2.  Quadricuspid aortic valve with central coaptation defect and severe aortic insufficiency. Diastolic flow reversal seen in descending aorta. 3. There is mild dilatation of the ascending aorta measuring 39 mm.  FINDINGS Left Ventricle: The left ventricle has mildly reduced systolic function, with an ejection fraction of 45-50%. The cavity size was moderately dilated. No evidence of left ventricular regional wall motion abnormalities.  Right Ventricle: The right ventricle has normal systolic function.  Left Atrium: Left atrial size was normal in size.   Right Atrium: Right atrial size was normal in size. Right atrial pressure is estimated at 10 mmHg.  Pericardium: There is no evidence of pericardial effusion.  Mitral Valve: The mitral valve is grossly normal. Mitral valve regurgitation is trivial by color flow Doppler.  Tricuspid Valve: The tricuspid valve was grossly normal. Tricuspid valve regurgitation was not visualized by color flow Doppler.  Aortic Valve: Quadricuspid aortic valve with central coaptation defect and severe aortic insufficiency. Diastolic flow reversal seen in descending aorta.  Pulmonic Valve: The pulmonic valve was grossly normal. Pulmonic valve regurgitation is not visualized by color flow Doppler.  Aorta: There is mild dilatation of the ascending aorta measuring 39  mm.   +------------+--------++  AORTIC VALVE     +------------+--------++  AR PHT:  292 msec   +------------+--------++   Vernell Leep MD Electronically signed by Vernell Leep MD Signature Date/Time: 11/11/2018/10:03:52 AM     CT ANGIOGRAPHY CHEST, ABDOMEN AND PELVIS  TECHNIQUE: Multidetector CT imaging through the chest, abdomen and pelvis was performed using the standard protocol during bolus administration of intravenous contrast. Multiplanar reconstructed images and MIPs were obtained and reviewed to evaluate the vascular anatomy.  Creatinine was obtained on site at [facility] at [address]  Results: Creatinine 1.7 mg/dL.  CONTRAST: 63mL ISOVUE-370 IOPAMIDOL (ISOVUE-370) INJECTION 76%  COMPARISON: CT scan of the chest 08/27/2018; CT scan of the abdomen and pelvis 10/19/2016  FINDINGS: CTA CHEST FINDINGS  Cardiovascular: Conventional 3 vessel arch anatomy. No significant atherosclerotic plaque in the thoracic aorta. Mild ectasia of the tubular portion of the ascending thoracic aorta with a maximal transverse diameter of 3.7 cm. The aortic valve appears thickened. The valve was much better evaluated on the prior gated cardiac CTA. The heart is normal in size. Mild concentric left ventricular hypertrophy. No pericardial effusion. Scattered calcifications along the coronary arteries.  Mediastinum/Nodes: Unremarkable CT appearance of the thyroid gland. No suspicious mediastinal or hilar adenopathy. No soft tissue mediastinal mass. The thoracic esophagus is unremarkable.  Lungs/Pleura: Lungs are clear. No pleural effusion or pneumothorax.  Musculoskeletal: No acute fracture or aggressive appearing lytic or blastic osseous lesion.  Review of the MIP images confirms the above findings.  CTA ABDOMEN AND PELVIS FINDINGS  VASCULAR  Aorta: Normal caliber aorta without aneurysm, dissection,  vasculitis or significant stenosis.  Celiac: Patent without evidence of aneurysm, dissection, vasculitis or significant stenosis.  SMA: Patent without evidence of aneurysm, dissection, vasculitis or significant stenosis.  Renals: Both renal arteries are patent without evidence of aneurysm, dissection, vasculitis, fibromuscular dysplasia or significant stenosis.  IMA: Patent without evidence of aneurysm, dissection, vasculitis or significant stenosis.  Inflow: Patent without evidence of aneurysm, dissection, vasculitis or significant stenosis.  Veins: No focal venous abnormality.  Review of the MIP images confirms the above findings.  NON-VASCULAR  Hepatobiliary: Normal hepatic contour and morphology. No discrete hepatic lesions. Normal appearance of the gallbladder. No intra or extrahepatic biliary ductal dilatation.  Pancreas: Unremarkable. No pancreatic ductal dilatation or surrounding inflammatory changes.  Spleen: Normal in size without focal abnormality.  Adrenals/Urinary Tract: The adrenal glands are normal. Numerous bilateral nonobstructing renal stones. There are at least 9 stones on the right, and 9 stones on the left. The largest individual stones measure approximately 5-6 mm. No enhancing renal mass. Circumscribed low-attenuation lesions in the left kidney are most consistent with simple cysts. The ureters and bladder are unremarkable.  Stomach/Bowel: Stomach is within normal limits. Appendix appears normal. No evidence of bowel wall thickening, distention, or inflammatory changes.  Lymphatic: No suspicious lymphadenopathy.  Reproductive: Prostate is unremarkable.  Other: No abdominal wall hernia or abnormality. No abdominopelvic ascites.  Musculoskeletal: No acute or significant osseous findings.  Review of the MIP images confirms the above findings.  IMPRESSION: 1. No evidence of aortic aneurysm, dissection or significant  non coronary atherosclerotic plaque. 2. Normal caliber femoral and iliac arteries bilaterally. 3. Thickened aortic valve which was better evaluated on the cardiac gated CTA of the chest dated 08/27/2018. 4. Coronary artery calcifications. 5. No acute cardiopulmonary process. 6. No acute abnormality within the abdomen or pelvis. 7. Numerous bilateral nonobstructing renal stones.  Signed,  Criselda Peaches, MD, [facility]  [facility]   Electronically Signed By: Jacqulynn Cadet M.D. 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 He was hospitalized in early April with an acute exacerbation of chronic combined systolic and diastolic congestive heart failure. He has been clinically stable since then although he continues to experience exertional shortness of breath and chest discomfort consistent with chronic combined systolic and diastolic congestive heart failure, New York Heart Association functional class II-III.  At present his mobility remains significantly limited because of severe pain in his left hip attributed to degenerative arthritis.  Steroid hip injection has been scheduled in the near future.  I have personally reviewed the patient's recent transesophageal echocardiogram, diagnostic cardiac catheterization, and CT angiograms.  The patient has a quadricuspid aortic valve with severe aortic insufficiency and moderate left ventricular systolic dysfunction.  There is no aortic stenosis.  However, there is some mild fibrosis, thickening, and retracted free margins of at least 2 of the 4 leaflets of the valve.  Although it is conceivable that an attempt at valve repair could be performed, it would be difficult to provide a reasonable estimate regarding long-term durability.  The overall size of the aortic valve and aortic root is  sufficient that use of subannular ring annuloplasty could potentially improve long-term durability.  I would not attempt valve repair using minimally invasive approach for surgery.  The patient appears to be a reasonable candidate for minimally invasive approach for surgery if valve replacement is planned without any attempt at valve repair.    Plan:  The patientwas againcounseled at length regarding treatment alternatives for management of severe aortic insufficiencyincluding continued medical therapy versus proceeding with aortic valve repair orreplacement in the near future. The natural history of aortic insufficiencywas reviewed, as was long term prognosis with medical therapy alone. Surgical options were discussed at length including conventional surgical aortic valve replacement through either a full median sternotomy or using minimally invasive techniques. Other alternatives including an attempt atvalve repairwasdiscussed. Discussion was held comparing the relative risks of mechanical valve replacement with need for lifelong anticoagulation versus use of a bioprosthetic tissue valve and the associated potential for late structural valve deterioration and failure. This discussion was placed in the context of the patient's particular circumstances, and as  a result the patient specifically requests that hisvalve be replaced using a bioprosthetic tissue valve.The patient would rather not proceed with an attempt at valve repair if long-term durability remains uncertain.  He is particularly interested inminimally invasive surgical techniques for valve replacement.  We tentatively plan to proceed with surgery on December 16, 2018.  The patient is currently scheduled to undergo steroid injection of his hip on December 10, 2018.  The patient understands and accepts all potential associated risks of surgery including but not limited to risk of death, stroke, myocardial infarction, congestive heart  failure, respiratory failure, renal failure, pneumonia, bleeding requiring blood transfusion and or reexploration, arrhythmia, heart block or bradycardia requiring permanent pacemaker, aortic dissection or other major vascular complication, pleural effusions or other delayed complications related to continued congestive heart failure, and other late complications related to valve replacement including structural valve deterioration and failure, thrombosis, endocarditis, or paravalvular leak.  The relative risks and benefits of use of a minithoracotomy approach for surgery have been reviewed as they pertain to the patient's specific circumstances, and all of his questions have been addressed.  Specific risks potentially related to the minimally-invasive approach were discussed at length, including but not limited to risk of conversion to full or partial sternotomy, aortic dissection or other major vascular complication, unilateral acute lung injury or pulmonary edema, phrenic nerve dysfunction or paralysis, rib fracture, chronic pain, lung hernia, or lymphocele.  Expectations for his postoperative convalescence have been discussed.  All questions answered.      Valentina Gu. Roxy Manns, MD 11/24/2018 2:02 PM

## 2018-12-16 ENCOUNTER — Inpatient Hospital Stay (HOSPITAL_COMMUNITY): Payer: No Typology Code available for payment source | Admitting: Physician Assistant

## 2018-12-16 ENCOUNTER — Inpatient Hospital Stay (HOSPITAL_COMMUNITY): Payer: No Typology Code available for payment source | Admitting: Certified Registered Nurse Anesthetist

## 2018-12-16 ENCOUNTER — Encounter (HOSPITAL_COMMUNITY)
Admission: RE | Disposition: A | Discharge status: Home / Self Care | Payer: Self-pay | Source: Home / Self Care | Attending: Thoracic Surgery (Cardiothoracic Vascular Surgery)

## 2018-12-16 ENCOUNTER — Inpatient Hospital Stay (HOSPITAL_COMMUNITY)
Admission: RE | Admit: 2018-12-16 | Discharge: 2018-12-20 | DRG: 220 | Disposition: A | Discharge status: Home / Self Care | Payer: No Typology Code available for payment source | Attending: Thoracic Surgery (Cardiothoracic Vascular Surgery) | Admitting: Thoracic Surgery (Cardiothoracic Vascular Surgery)

## 2018-12-16 ENCOUNTER — Encounter (HOSPITAL_COMMUNITY): Payer: Self-pay | Admitting: *Deleted

## 2018-12-16 ENCOUNTER — Inpatient Hospital Stay (HOSPITAL_COMMUNITY): Payer: No Typology Code available for payment source

## 2018-12-16 ENCOUNTER — Other Ambulatory Visit: Payer: Self-pay

## 2018-12-16 DIAGNOSIS — I5042 Chronic combined systolic (congestive) and diastolic (congestive) heart failure: Secondary | ICD-10-CM | POA: Diagnosis present

## 2018-12-16 DIAGNOSIS — G8929 Other chronic pain: Secondary | ICD-10-CM | POA: Diagnosis present

## 2018-12-16 DIAGNOSIS — Z953 Presence of xenogenic heart valve: Secondary | ICD-10-CM

## 2018-12-16 DIAGNOSIS — Z87891 Personal history of nicotine dependence: Secondary | ICD-10-CM | POA: Diagnosis not present

## 2018-12-16 DIAGNOSIS — I10 Essential (primary) hypertension: Secondary | ICD-10-CM | POA: Diagnosis present

## 2018-12-16 DIAGNOSIS — Z1159 Encounter for screening for other viral diseases: Secondary | ICD-10-CM

## 2018-12-16 DIAGNOSIS — I447 Left bundle-branch block, unspecified: Secondary | ICD-10-CM | POA: Diagnosis present

## 2018-12-16 DIAGNOSIS — M25551 Pain in right hip: Secondary | ICD-10-CM | POA: Diagnosis present

## 2018-12-16 DIAGNOSIS — Z825 Family history of asthma and other chronic lower respiratory diseases: Secondary | ICD-10-CM | POA: Diagnosis not present

## 2018-12-16 DIAGNOSIS — G4733 Obstructive sleep apnea (adult) (pediatric): Secondary | ICD-10-CM | POA: Diagnosis present

## 2018-12-16 DIAGNOSIS — D631 Anemia in chronic kidney disease: Secondary | ICD-10-CM | POA: Diagnosis present

## 2018-12-16 DIAGNOSIS — I351 Nonrheumatic aortic (valve) insufficiency: Secondary | ICD-10-CM | POA: Diagnosis present

## 2018-12-16 DIAGNOSIS — Z79891 Long term (current) use of opiate analgesic: Secondary | ICD-10-CM | POA: Diagnosis not present

## 2018-12-16 DIAGNOSIS — I252 Old myocardial infarction: Secondary | ICD-10-CM | POA: Diagnosis not present

## 2018-12-16 DIAGNOSIS — Z79899 Other long term (current) drug therapy: Secondary | ICD-10-CM

## 2018-12-16 DIAGNOSIS — M25552 Pain in left hip: Secondary | ICD-10-CM | POA: Diagnosis present

## 2018-12-16 DIAGNOSIS — F419 Anxiety disorder, unspecified: Secondary | ICD-10-CM | POA: Diagnosis present

## 2018-12-16 DIAGNOSIS — I2510 Atherosclerotic heart disease of native coronary artery without angina pectoris: Secondary | ICD-10-CM | POA: Diagnosis present

## 2018-12-16 DIAGNOSIS — N200 Calculus of kidney: Secondary | ICD-10-CM | POA: Diagnosis present

## 2018-12-16 DIAGNOSIS — I77810 Thoracic aortic ectasia: Secondary | ICD-10-CM | POA: Diagnosis present

## 2018-12-16 DIAGNOSIS — E1122 Type 2 diabetes mellitus with diabetic chronic kidney disease: Secondary | ICD-10-CM | POA: Diagnosis present

## 2018-12-16 DIAGNOSIS — Z952 Presence of prosthetic heart valve: Secondary | ICD-10-CM

## 2018-12-16 DIAGNOSIS — N189 Chronic kidney disease, unspecified: Secondary | ICD-10-CM | POA: Diagnosis present

## 2018-12-16 DIAGNOSIS — Z7982 Long term (current) use of aspirin: Secondary | ICD-10-CM | POA: Diagnosis not present

## 2018-12-16 DIAGNOSIS — M1990 Unspecified osteoarthritis, unspecified site: Secondary | ICD-10-CM | POA: Diagnosis present

## 2018-12-16 DIAGNOSIS — J45909 Unspecified asthma, uncomplicated: Secondary | ICD-10-CM | POA: Diagnosis present

## 2018-12-16 DIAGNOSIS — Q238 Other congenital malformations of aortic and mitral valves: Secondary | ICD-10-CM

## 2018-12-16 DIAGNOSIS — J9811 Atelectasis: Secondary | ICD-10-CM

## 2018-12-16 DIAGNOSIS — I130 Hypertensive heart and chronic kidney disease with heart failure and stage 1 through stage 4 chronic kidney disease, or unspecified chronic kidney disease: Secondary | ICD-10-CM | POA: Diagnosis present

## 2018-12-16 HISTORY — PX: TEE WITHOUT CARDIOVERSION: SHX5443

## 2018-12-16 HISTORY — PX: RIB PLATING: SHX5079

## 2018-12-16 HISTORY — PX: AORTIC VALVE REPLACEMENT: SHX41

## 2018-12-16 HISTORY — DX: Presence of xenogenic heart valve: Z95.3

## 2018-12-16 HISTORY — DX: Anemia in chronic kidney disease: D63.1

## 2018-12-16 LAB — POCT I-STAT 4, (NA,K, GLUC, HGB,HCT)
Glucose, Bld: 103 mg/dL — ABNORMAL HIGH (ref 70–99)
Glucose, Bld: 129 mg/dL — ABNORMAL HIGH (ref 70–99)
Glucose, Bld: 139 mg/dL — ABNORMAL HIGH (ref 70–99)
Glucose, Bld: 128 mg/dL — ABNORMAL HIGH (ref 70–99)
Glucose, Bld: 135 mg/dL — ABNORMAL HIGH (ref 70–99)
Glucose, Bld: 100 mg/dL — ABNORMAL HIGH (ref 70–99)
HCT: 28 % — ABNORMAL LOW (ref 39.0–52.0)
HCT: 26 % — ABNORMAL LOW (ref 39.0–52.0)
HCT: 23 % — ABNORMAL LOW (ref 39.0–52.0)
HCT: 24 % — ABNORMAL LOW (ref 39.0–52.0)
HCT: 24 % — ABNORMAL LOW (ref 39.0–52.0)
HCT: 26 % — ABNORMAL LOW (ref 39.0–52.0)
Hemoglobin: 9.5 g/dL — ABNORMAL LOW (ref 13.0–17.0)
Hemoglobin: 8.8 g/dL — ABNORMAL LOW (ref 13.0–17.0)
Hemoglobin: 7.8 g/dL — ABNORMAL LOW (ref 13.0–17.0)
Hemoglobin: 8.2 g/dL — ABNORMAL LOW (ref 13.0–17.0)
Hemoglobin: 8.2 g/dL — ABNORMAL LOW (ref 13.0–17.0)
Hemoglobin: 8.8 g/dL — ABNORMAL LOW (ref 13.0–17.0)
Potassium: 3.5 mmol/L (ref 3.5–5.1)
Potassium: 3.8 mmol/L (ref 3.5–5.1)
Potassium: 4.3 mmol/L (ref 3.5–5.1)
Potassium: 4.2 mmol/L (ref 3.5–5.1)
Potassium: 3.6 mmol/L (ref 3.5–5.1)
Potassium: 4.2 mmol/L (ref 3.5–5.1)
Sodium: 142 mmol/L (ref 135–145)
Sodium: 139 mmol/L (ref 135–145)
Sodium: 135 mmol/L (ref 135–145)
Sodium: 138 mmol/L (ref 135–145)
Sodium: 142 mmol/L (ref 135–145)
Sodium: 141 mmol/L (ref 135–145)

## 2018-12-16 LAB — CREATININE, SERUM
Creatinine, Ser: 1.42 mg/dL — ABNORMAL HIGH (ref 0.61–1.24)
GFR calc Af Amer: >60 mL/min (ref >60)
GFR calc non Af Amer: 53 mL/min — ABNORMAL LOW (ref >60)

## 2018-12-16 LAB — POCT I-STAT 7, (LYTES, BLD GAS, ICA,H+H)
Acid-Base Excess: 2 mmol/L (ref 0.0–2.0)
Acid-base deficit: 1 mmol/L (ref 0.0–2.0)
Acid-base deficit: 1 mmol/L (ref 0.0–2.0)
Bicarbonate: 27.7 mmol/L (ref 20.0–28.0)
Bicarbonate: 26.6 mmol/L (ref 20.0–28.0)
Bicarbonate: 24.9 mmol/L (ref 20.0–28.0)
Calcium, Ion: 1.08 mmol/L — ABNORMAL LOW (ref 1.15–1.40)
Calcium, Ion: 1.17 mmol/L (ref 1.15–1.40)
Calcium, Ion: 1.15 mmol/L (ref 1.15–1.40)
HCT: 25 % — ABNORMAL LOW (ref 39.0–52.0)
HCT: 30 % — ABNORMAL LOW (ref 39.0–52.0)
HCT: 29 % — ABNORMAL LOW (ref 39.0–52.0)
Hemoglobin: 8.5 g/dL — ABNORMAL LOW (ref 13.0–17.0)
Hemoglobin: 10.2 g/dL — ABNORMAL LOW (ref 13.0–17.0)
Hemoglobin: 9.9 g/dL — ABNORMAL LOW (ref 13.0–17.0)
O2 Saturation: 100 %
O2 Saturation: 93 %
O2 Saturation: 95 %
Patient temperature: 37.3
Patient temperature: 37.2
Potassium: 4.2 mmol/L (ref 3.5–5.1)
Potassium: 4.5 mmol/L (ref 3.5–5.1)
Potassium: 4.9 mmol/L (ref 3.5–5.1)
Sodium: 141 mmol/L (ref 135–145)
Sodium: 142 mmol/L (ref 135–145)
Sodium: 141 mmol/L (ref 135–145)
TCO2: 29 mmol/L (ref 22–32)
TCO2: 28 mmol/L (ref 22–32)
TCO2: 26 mmol/L (ref 22–32)
pCO2 arterial: 46.6 mmHg (ref 32.0–48.0)
pCO2 arterial: 58.8 mmHg — ABNORMAL HIGH (ref 32.0–48.0)
pCO2 arterial: 46.1 mmHg (ref 32.0–48.0)
pH, Arterial: 7.382 (ref 7.350–7.450)
pH, Arterial: 7.266 — ABNORMAL LOW (ref 7.350–7.450)
pH, Arterial: 7.34 — ABNORMAL LOW (ref 7.350–7.450)
pO2, Arterial: 343 mmHg — ABNORMAL HIGH (ref 83.0–108.0)
pO2, Arterial: 81 mmHg — ABNORMAL LOW (ref 83.0–108.0)
pO2, Arterial: 79 mmHg — ABNORMAL LOW (ref 83.0–108.0)

## 2018-12-16 LAB — CBC
HCT: 28.1 % — ABNORMAL LOW (ref 39.0–52.0)
HCT: 28.5 % — ABNORMAL LOW (ref 39.0–52.0)
Hemoglobin: 8.5 g/dL — ABNORMAL LOW (ref 13.0–17.0)
Hemoglobin: 8.4 g/dL — ABNORMAL LOW (ref 13.0–17.0)
MCH: 24.9 pg — ABNORMAL LOW (ref 26.0–34.0)
MCH: 23.9 pg — ABNORMAL LOW (ref 26.0–34.0)
MCHC: 30.2 g/dL (ref 30.0–36.0)
MCHC: 29.5 g/dL — ABNORMAL LOW (ref 30.0–36.0)
MCV: 82.2 fL (ref 80.0–100.0)
MCV: 81.2 fL (ref 80.0–100.0)
Platelets: 247 10*3/uL (ref 150–400)
Platelets: 234 10*3/uL (ref 150–400)
RBC: 3.42 MIL/uL — ABNORMAL LOW (ref 4.22–5.81)
RBC: 3.51 MIL/uL — ABNORMAL LOW (ref 4.22–5.81)
RDW: 14.1 % (ref 11.5–15.5)
RDW: 14.1 % (ref 11.5–15.5)
WBC: 14.7 10*3/uL — ABNORMAL HIGH (ref 4.0–10.5)
WBC: 10.9 10*3/uL — ABNORMAL HIGH (ref 4.0–10.5)
nRBC: 0 % (ref 0.0–0.2)
nRBC: 0 % (ref 0.0–0.2)

## 2018-12-16 LAB — POCT I-STAT, CHEM 8
BUN: 14 mg/dL (ref 8–23)
Calcium, Ion: 1.15 mmol/L (ref 1.15–1.40)
Chloride: 107 mmol/L (ref 98–111)
Creatinine, Ser: 1.3 mg/dL — ABNORMAL HIGH (ref 0.61–1.24)
Glucose, Bld: 139 mg/dL — ABNORMAL HIGH (ref 70–99)
HCT: 27 % — ABNORMAL LOW (ref 39.0–52.0)
Hemoglobin: 9.2 g/dL — ABNORMAL LOW (ref 13.0–17.0)
Potassium: 5 mmol/L (ref 3.5–5.1)
Sodium: 139 mmol/L (ref 135–145)
TCO2: 23 mmol/L (ref 22–32)

## 2018-12-16 LAB — PLATELET COUNT: Platelets: 234 10*3/uL (ref 150–400)

## 2018-12-16 LAB — HEMOGLOBIN AND HEMATOCRIT, BLOOD
HCT: 23.5 % — ABNORMAL LOW (ref 39.0–52.0)
Hemoglobin: 7.3 g/dL — ABNORMAL LOW (ref 13.0–17.0)

## 2018-12-16 LAB — GLUCOSE, CAPILLARY
Glucose-Capillary: 102 mg/dL — ABNORMAL HIGH (ref 70–99)
Glucose-Capillary: 159 mg/dL — ABNORMAL HIGH (ref 70–99)
Glucose-Capillary: 84 mg/dL (ref 70–99)
Glucose-Capillary: 96 mg/dL (ref 70–99)

## 2018-12-16 LAB — APTT: aPTT: 41 seconds — ABNORMAL HIGH (ref 24–36)

## 2018-12-16 LAB — PROTIME-INR
INR: 1.4 — ABNORMAL HIGH (ref 0.8–1.2)
Prothrombin Time: 17.1 seconds — ABNORMAL HIGH (ref 11.4–15.2)

## 2018-12-16 LAB — PREPARE RBC (CROSSMATCH)

## 2018-12-16 LAB — MAGNESIUM: Magnesium: 3.1 mg/dL — ABNORMAL HIGH (ref 1.7–2.4)

## 2018-12-16 SURGERY — REPLACEMENT, AORTIC VALVE, MINIMALLY INVASIVE
Anesthesia: General | Site: Chest

## 2018-12-16 MED ORDER — LACTATED RINGERS IV SOLN
INTRAVENOUS | Status: DC
  Administered 2018-12-17: 21:00:00 via INTRAVENOUS

## 2018-12-16 MED ORDER — PANTOPRAZOLE SODIUM 40 MG PO TBEC
40.0000 mg | DELAYED_RELEASE_TABLET | Freq: Every day | ORAL | Status: DC
  Administered 2018-12-18 – 2018-12-20 (×3): 40 mg via ORAL
  Filled 2018-12-16 (×3): qty 1

## 2018-12-16 MED ORDER — VANCOMYCIN HCL IN DEXTROSE 1-5 GM/200ML-% IV SOLN
1000.0000 mg | Freq: Once | INTRAVENOUS | Status: AC
  Administered 2018-12-16: 1000 mg via INTRAVENOUS
  Filled 2018-12-16: qty 200

## 2018-12-16 MED ORDER — MORPHINE SULFATE (PF) 2 MG/ML IV SOLN
1.0000 mg | INTRAVENOUS | Status: DC | PRN
  Administered 2018-12-16 – 2018-12-19 (×6): 2 mg via INTRAVENOUS
  Filled 2018-12-16 (×6): qty 1

## 2018-12-16 MED ORDER — NITROGLYCERIN IN D5W 200-5 MCG/ML-% IV SOLN: 0.0000 ug/min | INTRAVENOUS | Status: DC

## 2018-12-16 MED ORDER — METOPROLOL TARTRATE 5 MG/5ML IV SOLN: 2.5000 mg | INTRAVENOUS | Status: DC | PRN

## 2018-12-16 MED ORDER — HEPARIN SODIUM (PORCINE) 1000 UNIT/ML IJ SOLN
INTRAMUSCULAR | Status: AC
  Filled 2018-12-16: qty 1

## 2018-12-16 MED ORDER — SODIUM CHLORIDE 0.9% FLUSH
10.0000 mL | Freq: Two times a day (BID) | INTRAVENOUS | Status: DC
  Administered 2018-12-17 – 2018-12-18 (×2): 10 mL

## 2018-12-16 MED ORDER — ROCURONIUM BROMIDE 10 MG/ML (PF) SYRINGE
PREFILLED_SYRINGE | INTRAVENOUS | Status: AC
  Filled 2018-12-16: qty 20

## 2018-12-16 MED ORDER — FAMOTIDINE IN NACL 20-0.9 MG/50ML-% IV SOLN
20.0000 mg | Freq: Two times a day (BID) | INTRAVENOUS | Status: DC
  Administered 2018-12-16: 15:00:00 20 mg via INTRAVENOUS

## 2018-12-16 MED ORDER — PLASMA-LYTE 148 IV SOLN
INTRAVENOUS | Status: DC | PRN
  Administered 2018-12-16: 09:00:00 500 mL via INTRAVASCULAR

## 2018-12-16 MED ORDER — ROCURONIUM BROMIDE 10 MG/ML (PF) SYRINGE
PREFILLED_SYRINGE | INTRAVENOUS | Status: DC | PRN
  Administered 2018-12-16: 20 mg via INTRAVENOUS
  Administered 2018-12-16: 50 mg via INTRAVENOUS
  Administered 2018-12-16: 30 mg via INTRAVENOUS
  Administered 2018-12-16 (×2): 50 mg via INTRAVENOUS

## 2018-12-16 MED ORDER — SODIUM CHLORIDE 0.9 % IV SOLN
INTRAVENOUS | Status: DC
  Administered 2018-12-16: 15:00:00 via INTRAVENOUS

## 2018-12-16 MED ORDER — OXYCODONE HCL 5 MG PO TABS
5.0000 mg | ORAL_TABLET | ORAL | Status: DC | PRN
  Administered 2018-12-16 – 2018-12-20 (×10): 10 mg via ORAL
  Filled 2018-12-16 (×10): qty 2

## 2018-12-16 MED ORDER — LACTATED RINGERS IV SOLN: INTRAVENOUS | Status: DC

## 2018-12-16 MED ORDER — PROTAMINE SULFATE 10 MG/ML IV SOLN
INTRAVENOUS | Status: AC
  Filled 2018-12-16: qty 5

## 2018-12-16 MED ORDER — MIDAZOLAM HCL 2 MG/2ML IJ SOLN: 2.0000 mg | INTRAMUSCULAR | Status: DC | PRN

## 2018-12-16 MED ORDER — SODIUM CHLORIDE 0.45 % IV SOLN
INTRAVENOUS | Status: DC | PRN
  Administered 2018-12-16: 15:00:00 via INTRAVENOUS

## 2018-12-16 MED ORDER — TRAMADOL HCL 50 MG PO TABS
50.0000 mg | ORAL_TABLET | ORAL | Status: DC | PRN
  Administered 2018-12-17: 100 mg via ORAL
  Administered 2018-12-18 – 2018-12-20 (×3): 50 mg via ORAL
  Filled 2018-12-16: qty 2
  Filled 2018-12-16 (×3): qty 1

## 2018-12-16 MED ORDER — SUGAMMADEX SODIUM 200 MG/2ML IV SOLN
INTRAVENOUS | Status: DC | PRN
  Administered 2018-12-16: 300 mg via INTRAVENOUS

## 2018-12-16 MED ORDER — METOPROLOL TARTRATE 12.5 MG HALF TABLET
12.5000 mg | ORAL_TABLET | Freq: Once | ORAL | Status: AC
  Administered 2018-12-16: 12.5 mg via ORAL
  Filled 2018-12-16: qty 1

## 2018-12-16 MED ORDER — ACETAMINOPHEN 160 MG/5ML PO SOLN: 650.0000 mg | Freq: Once | ORAL | Status: AC

## 2018-12-16 MED ORDER — ONDANSETRON HCL 4 MG/2ML IJ SOLN
INTRAMUSCULAR | Status: DC | PRN
  Administered 2018-12-16: 4 mg via INTRAVENOUS

## 2018-12-16 MED ORDER — POTASSIUM CHLORIDE 10 MEQ/50ML IV SOLN: 10.0000 meq | INTRAVENOUS | Status: AC

## 2018-12-16 MED ORDER — CHLORHEXIDINE GLUCONATE 4 % EX LIQD: 30.0000 mL | CUTANEOUS | Status: DC

## 2018-12-16 MED ORDER — ALBUMIN HUMAN 5 % IV SOLN: 250.0000 mL | INTRAVENOUS | Status: AC | PRN

## 2018-12-16 MED ORDER — PROPOFOL 10 MG/ML IV BOLUS
INTRAVENOUS | Status: AC
  Filled 2018-12-16: qty 20

## 2018-12-16 MED ORDER — ORAL CARE MOUTH RINSE
15.0000 mL | Freq: Two times a day (BID) | OROMUCOSAL | Status: DC
  Administered 2018-12-16 – 2018-12-18 (×4): 15 mL via OROMUCOSAL

## 2018-12-16 MED ORDER — FENTANYL CITRATE (PF) 250 MCG/5ML IJ SOLN
INTRAMUSCULAR | Status: AC
  Filled 2018-12-16: qty 25

## 2018-12-16 MED ORDER — LACTATED RINGERS IV SOLN
INTRAVENOUS | Status: DC | PRN
  Administered 2018-12-16: 07:00:00 via INTRAVENOUS

## 2018-12-16 MED ORDER — BISACODYL 5 MG PO TBEC
10.0000 mg | DELAYED_RELEASE_TABLET | Freq: Every day | ORAL | Status: DC
  Administered 2018-12-17 – 2018-12-18 (×2): 10 mg via ORAL
  Filled 2018-12-16 (×3): qty 2

## 2018-12-16 MED ORDER — LACTATED RINGERS IV SOLN: 500.0000 mL | Freq: Once | INTRAVENOUS | Status: DC | PRN

## 2018-12-16 MED ORDER — HEPARIN SODIUM (PORCINE) 1000 UNIT/ML IJ SOLN
INTRAMUSCULAR | Status: AC
  Filled 2018-12-16: qty 2

## 2018-12-16 MED ORDER — DEXMEDETOMIDINE HCL IN NACL 400 MCG/100ML IV SOLN: 0.0000 ug/kg/h | INTRAVENOUS | Status: DC

## 2018-12-16 MED ORDER — DEXAMETHASONE SODIUM PHOSPHATE 10 MG/ML IJ SOLN
INTRAMUSCULAR | Status: AC
  Filled 2018-12-16: qty 1

## 2018-12-16 MED ORDER — ASPIRIN EC 325 MG PO TBEC
325.0000 mg | DELAYED_RELEASE_TABLET | Freq: Every day | ORAL | Status: DC
  Administered 2018-12-17 – 2018-12-20 (×4): 325 mg via ORAL
  Filled 2018-12-16 (×4): qty 1

## 2018-12-16 MED ORDER — ACETAMINOPHEN 650 MG RE SUPP
650.0000 mg | Freq: Once | RECTAL | Status: AC
  Administered 2018-12-16: 650 mg via RECTAL

## 2018-12-16 MED ORDER — SODIUM CHLORIDE 0.9 % IR SOLN
Status: DC | PRN
  Administered 2018-12-16: 3000 mL

## 2018-12-16 MED ORDER — ACETAMINOPHEN 160 MG/5ML PO SOLN: 1000.0000 mg | Freq: Four times a day (QID) | ORAL | Status: DC

## 2018-12-16 MED ORDER — SODIUM CHLORIDE 0.9 % IV SOLN
250.0000 mL | INTRAVENOUS | Status: DC
  Administered 2018-12-18: 250 mL via INTRAVENOUS

## 2018-12-16 MED ORDER — CHLORHEXIDINE GLUCONATE 0.12 % MT SOLN
15.0000 mL | Freq: Once | OROMUCOSAL | Status: AC
  Administered 2018-12-16: 15 mL via OROMUCOSAL
  Filled 2018-12-16: qty 15

## 2018-12-16 MED ORDER — MONTELUKAST SODIUM 10 MG PO TABS
10.0000 mg | ORAL_TABLET | Freq: Every evening | ORAL | Status: DC
  Administered 2018-12-17 – 2018-12-19 (×3): 10 mg via ORAL
  Filled 2018-12-16 (×3): qty 1

## 2018-12-16 MED ORDER — INSULIN REGULAR(HUMAN) IN NACL 100-0.9 UT/100ML-% IV SOLN: INTRAVENOUS | Status: DC

## 2018-12-16 MED ORDER — SODIUM CHLORIDE 0.9% FLUSH: 10.0000 mL | INTRAVENOUS | Status: DC | PRN

## 2018-12-16 MED ORDER — MIDAZOLAM HCL 5 MG/5ML IJ SOLN
INTRAMUSCULAR | Status: DC | PRN
  Administered 2018-12-16: 3 mg via INTRAVENOUS
  Administered 2018-12-16 (×4): 1 mg via INTRAVENOUS

## 2018-12-16 MED ORDER — SODIUM CHLORIDE 0.9 % IV SOLN
1.5000 g | Freq: Two times a day (BID) | INTRAVENOUS | Status: AC
  Administered 2018-12-16 – 2018-12-18 (×4): 1.5 g via INTRAVENOUS
  Filled 2018-12-16 (×4): qty 1.5

## 2018-12-16 MED ORDER — MIDAZOLAM HCL (PF) 10 MG/2ML IJ SOLN
INTRAMUSCULAR | Status: AC
  Filled 2018-12-16: qty 2

## 2018-12-16 MED ORDER — PROTAMINE SULFATE 10 MG/ML IV SOLN
INTRAVENOUS | Status: DC | PRN
  Administered 2018-12-16: 290 mg via INTRAVENOUS
  Administered 2018-12-16: 10 mg via INTRAVENOUS

## 2018-12-16 MED ORDER — ONDANSETRON HCL 4 MG/2ML IJ SOLN
4.0000 mg | Freq: Four times a day (QID) | INTRAMUSCULAR | Status: DC | PRN
  Administered 2018-12-18: 4 mg via INTRAVENOUS
  Filled 2018-12-16: qty 2

## 2018-12-16 MED ORDER — ALBUMIN HUMAN 5 % IV SOLN
INTRAVENOUS | Status: DC | PRN
  Administered 2018-12-16: 13:00:00 via INTRAVENOUS

## 2018-12-16 MED ORDER — SODIUM CHLORIDE 0.9% FLUSH: 3.0000 mL | Freq: Two times a day (BID) | INTRAVENOUS | Status: DC

## 2018-12-16 MED ORDER — VANCOMYCIN HCL 1000 MG IV SOLR
INTRAVENOUS | Status: DC | PRN
  Administered 2018-12-16: 1000 mL

## 2018-12-16 MED ORDER — ACETAMINOPHEN 500 MG PO TABS
1000.0000 mg | ORAL_TABLET | Freq: Four times a day (QID) | ORAL | Status: DC
  Administered 2018-12-17 – 2018-12-20 (×14): 1000 mg via ORAL
  Filled 2018-12-16 (×14): qty 2

## 2018-12-16 MED ORDER — CHLORHEXIDINE GLUCONATE CLOTH 2 % EX PADS
6.0000 | MEDICATED_PAD | Freq: Every day | CUTANEOUS | Status: DC
  Administered 2018-12-16: 6 via TOPICAL

## 2018-12-16 MED ORDER — PROTAMINE SULFATE 10 MG/ML IV SOLN
INTRAVENOUS | Status: AC
  Filled 2018-12-16: qty 25

## 2018-12-16 MED ORDER — FENTANYL CITRATE (PF) 250 MCG/5ML IJ SOLN
INTRAMUSCULAR | Status: DC | PRN
  Administered 2018-12-16: 100 ug via INTRAVENOUS
  Administered 2018-12-16: 400 ug via INTRAVENOUS
  Administered 2018-12-16 (×3): 50 ug via INTRAVENOUS
  Administered 2018-12-16 (×2): 100 ug via INTRAVENOUS
  Administered 2018-12-16: 50 ug via INTRAVENOUS

## 2018-12-16 MED ORDER — PROPOFOL 10 MG/ML IV BOLUS
INTRAVENOUS | Status: DC | PRN
  Administered 2018-12-16: 200 mg via INTRAVENOUS

## 2018-12-16 MED ORDER — DOCUSATE SODIUM 100 MG PO CAPS
200.0000 mg | ORAL_CAPSULE | Freq: Every day | ORAL | Status: DC
  Administered 2018-12-17 – 2018-12-20 (×3): 200 mg via ORAL
  Filled 2018-12-16 (×3): qty 2

## 2018-12-16 MED ORDER — ASPIRIN 81 MG PO CHEW: 324.0000 mg | CHEWABLE_TABLET | Freq: Every day | ORAL | Status: DC

## 2018-12-16 MED ORDER — INSULIN REGULAR BOLUS VIA INFUSION
0.0000 [IU] | Freq: Three times a day (TID) | INTRAVENOUS | Status: DC
  Filled 2018-12-16: qty 10

## 2018-12-16 MED ORDER — PHENYLEPHRINE 40 MCG/ML (10ML) SYRINGE FOR IV PUSH (FOR BLOOD PRESSURE SUPPORT)
PREFILLED_SYRINGE | INTRAVENOUS | Status: AC
  Filled 2018-12-16: qty 10

## 2018-12-16 MED ORDER — MAGNESIUM SULFATE 4 GM/100ML IV SOLN
4.0000 g | Freq: Once | INTRAVENOUS | Status: AC
  Administered 2018-12-16: 4 g via INTRAVENOUS
  Filled 2018-12-16: qty 100

## 2018-12-16 MED ORDER — PHENYLEPHRINE HCL-NACL 20-0.9 MG/250ML-% IV SOLN
0.0000 ug/min | INTRAVENOUS | Status: DC
  Administered 2018-12-16: 20 ug/min via INTRAVENOUS
  Filled 2018-12-16: qty 250

## 2018-12-16 MED ORDER — BISACODYL 10 MG RE SUPP: 10.0000 mg | Freq: Every day | RECTAL | Status: DC

## 2018-12-16 MED ORDER — 0.9 % SODIUM CHLORIDE (POUR BTL) OPTIME
TOPICAL | Status: DC | PRN
  Administered 2018-12-16: 09:00:00 5000 mL

## 2018-12-16 MED ORDER — CHLORHEXIDINE GLUCONATE 0.12 % MT SOLN
15.0000 mL | OROMUCOSAL | Status: AC
  Administered 2018-12-16: 15 mL via OROMUCOSAL

## 2018-12-16 MED ORDER — ONDANSETRON HCL 4 MG/2ML IJ SOLN
INTRAMUSCULAR | Status: AC
  Filled 2018-12-16: qty 2

## 2018-12-16 MED ORDER — HEPARIN SODIUM (PORCINE) 1000 UNIT/ML IJ SOLN
INTRAMUSCULAR | Status: DC | PRN
  Administered 2018-12-16: 35000 [IU] via INTRAVENOUS

## 2018-12-16 MED ORDER — SODIUM CHLORIDE 0.9% FLUSH: 3.0000 mL | INTRAVENOUS | Status: DC | PRN

## 2018-12-16 MED ORDER — DEXAMETHASONE SODIUM PHOSPHATE 10 MG/ML IJ SOLN
INTRAMUSCULAR | Status: DC | PRN
  Administered 2018-12-16: 10 mg via INTRAVENOUS

## 2018-12-16 SURGICAL SUPPLY — 107 items
ADAPTER CARDIO PERF ANTE/RETRO (ADAPTER) ×4 IMPLANT
ADAPTER MULTI PERFUSION 15 (ADAPTER) ×1 IMPLANT
ADH SKN CLS APL DERMABOND .7 (GAUZE/BANDAGES/DRESSINGS) ×3
ADPR PRFSN 84XANTGRD RTRGD (ADAPTER) ×3
BAG DECANTER FOR FLEXI CONT (MISCELLANEOUS) ×4 IMPLANT
BATTERY MAXDRIVER (MISCELLANEOUS) ×1 IMPLANT
CANISTER SUCT 3000ML PPV (MISCELLANEOUS) ×4 IMPLANT
CANNULA FEM VENOUS REMOTE 22FR (CANNULA) ×1 IMPLANT
CANNULA FEMORAL ART 14 SM (MISCELLANEOUS) ×1 IMPLANT
CANNULA GUNDRY RCSP 15FR (MISCELLANEOUS) ×4 IMPLANT
CANNULA OPTISITE PERFUSION 16F (CANNULA) IMPLANT
CANNULA OPTISITE PERFUSION 18F (CANNULA) ×1 IMPLANT
CANNULA SUMP PERICARDIAL (CANNULA) ×4 IMPLANT
CATH CPB KIT OWEN (MISCELLANEOUS) IMPLANT
CATH HEART VENT LEFT (CATHETERS) ×3 IMPLANT
CATH ROBINSON RED A/P 18FR (CATHETERS) ×1 IMPLANT
CELLS DAT CNTRL 66122 CELL SVR (MISCELLANEOUS) ×3 IMPLANT
CONN ST 1/4X3/8  BEN (MISCELLANEOUS) ×2
CONN ST 1/4X3/8 BEN (MISCELLANEOUS) ×6 IMPLANT
CONNECTOR 1/2X3/8X1/2 3 WAY (MISCELLANEOUS) ×1
CONNECTOR 1/2X3/8X1/2 3WAY (MISCELLANEOUS) ×3 IMPLANT
CONT SPEC 4OZ CLIKSEAL STRL BL (MISCELLANEOUS) ×4 IMPLANT
COVER BACK TABLE 24X17X13 BIG (DRAPES) ×4 IMPLANT
COVER PROBE W GEL 5X96 (DRAPES) ×4 IMPLANT
COVER WAND RF STERILE (DRAPES) ×3 IMPLANT
DERMABOND ADVANCED (GAUZE/BANDAGES/DRESSINGS) ×1
DERMABOND ADVANCED .7 DNX12 (GAUZE/BANDAGES/DRESSINGS) ×6 IMPLANT
DEVICE CLOSURE PERCLS PRGLD 6F (VASCULAR PRODUCTS) IMPLANT
DEVICE SUT CK QUICK LOAD INDV (Prosthesis & Implant Heart) ×1 IMPLANT
DEVICE SUT CK QUICK LOAD MINI (Prosthesis & Implant Heart) ×1 IMPLANT
DEVICE TROCAR PUNCTURE CLOSURE (ENDOMECHANICALS) ×4 IMPLANT
DRAIN CHANNEL 32F RND 10.7 FF (WOUND CARE) ×8 IMPLANT
DRAPE BILATERAL SPLIT (DRAPES) ×4 IMPLANT
DRAPE CV SPLIT W-CLR ANES SCRN (DRAPES) ×4 IMPLANT
DRAPE INCISE IOBAN 66X45 STRL (DRAPES) ×11 IMPLANT
DRAPE SLUSH/WARMER DISC (DRAPES) ×4 IMPLANT
ELECT BLADE 4.0 EZ CLEAN MEGAD (MISCELLANEOUS) ×4
ELECT BLADE 6.5 EXT (BLADE) ×4 IMPLANT
ELECT REM PT RETURN 9FT ADLT (ELECTROSURGICAL) ×8
ELECTRODE BLDE 4.0 EZ CLN MEGD (MISCELLANEOUS) ×3 IMPLANT
ELECTRODE REM PT RTRN 9FT ADLT (ELECTROSURGICAL) ×6 IMPLANT
FELT TEFLON 1X6 (MISCELLANEOUS) ×8 IMPLANT
FEMORAL VENOUS CANN RAP (CANNULA) IMPLANT
GAUZE SPONGE 4X4 12PLY STRL (GAUZE/BANDAGES/DRESSINGS) ×4 IMPLANT
GAUZE SPONGE 4X4 12PLY STRL LF (GAUZE/BANDAGES/DRESSINGS) ×1 IMPLANT
GLOVE BIO SURGEON STRL SZ 6.5 (GLOVE) ×4 IMPLANT
GLOVE BIO SURGEON STRL SZ7.5 (GLOVE) ×2 IMPLANT
GLOVE ORTHO TXT STRL SZ7.5 (GLOVE) ×13 IMPLANT
GOWN STRL REUS W/ TWL LRG LVL3 (GOWN DISPOSABLE) ×12 IMPLANT
GOWN STRL REUS W/TWL LRG LVL3 (GOWN DISPOSABLE) ×32
GRASPER SUT TROCAR 14GX15 (MISCELLANEOUS) ×4 IMPLANT
IV NS IRRIG 3000ML ARTHROMATIC (IV SOLUTION) ×1 IMPLANT
KIT BASIN OR (CUSTOM PROCEDURE TRAY) ×4 IMPLANT
KIT CATH SUCT 8FR (CATHETERS) ×3 IMPLANT
KIT DILATOR VASC 18G NDL (KITS) ×4 IMPLANT
KIT DRAINAGE VACCUM ASSIST (KITS) ×1 IMPLANT
KIT SUCTION CATH 14FR (SUCTIONS) ×4 IMPLANT
KIT SUT CK MINI COMBO 4X17 (Prosthesis & Implant Heart) ×1 IMPLANT
KIT TURNOVER KIT B (KITS) ×4 IMPLANT
LEAD PACING MYOCARDI (MISCELLANEOUS) ×4 IMPLANT
LINE VENT (MISCELLANEOUS) ×1 IMPLANT
NDL AORTIC ROOT 14G 7F (CATHETERS) ×3 IMPLANT
NEEDLE AORTIC ROOT 14G 7F (CATHETERS) ×4 IMPLANT
NS IRRIG 1000ML POUR BTL (IV SOLUTION) ×20 IMPLANT
PACK E MIN INVASIVE VALVE (SUTURE) ×1 IMPLANT
PACK OPEN HEART (CUSTOM PROCEDURE TRAY) ×4 IMPLANT
PAD ARMBOARD 7.5X6 YLW CONV (MISCELLANEOUS) ×8 IMPLANT
PAD ELECT DEFIB RADIOL ZOLL (MISCELLANEOUS) ×4 IMPLANT
PERCLOSE PROGLIDE 6F (VASCULAR PRODUCTS) ×20
PLATE RIB LOCKING UNI 14HOLE (Plate) ×1 IMPLANT
POSITIONER HEAD DONUT 9IN (MISCELLANEOUS) ×4 IMPLANT
RETRACTOR WND ALEXIS 18 MED (MISCELLANEOUS) ×3 IMPLANT
RTRCTR WOUND ALEXIS 18CM MED (MISCELLANEOUS) ×4
SCREW STERNAL 2.3X17MM (Screw) ×4 IMPLANT
SCREW STERNAL LOCK 2.3MM (Screw) ×6 IMPLANT
SET CANNULATION TOURNIQUET (MISCELLANEOUS) ×4 IMPLANT
SET CARDIOPLEGIA MPS 5001102 (MISCELLANEOUS) ×1 IMPLANT
SET IRRIG TUBING LAPAROSCOPIC (IRRIGATION / IRRIGATOR) ×4 IMPLANT
SET MICROPUNCTURE 5F STIFF (MISCELLANEOUS) ×1 IMPLANT
SHEATH PINNACLE 8F 10CM (SHEATH) ×3 IMPLANT
SOLUTION ANTI FOG 6CC (MISCELLANEOUS) ×4 IMPLANT
SUT BONE WAX W31G (SUTURE) ×4 IMPLANT
SUT ETHIBON 2 0 V 52N 30 (SUTURE) ×2 IMPLANT
SUT ETHIBOND X763 2 0 SH 1 (SUTURE) ×2 IMPLANT
SUT GORETEX CV 4 TH 22 36 (SUTURE) ×3 IMPLANT
SUT GORETEX CV4 TH-18 (SUTURE) ×6 IMPLANT
SUT PROLENE 3 0 SH DA (SUTURE) ×1 IMPLANT
SUT PROLENE 3 0 SH1 36 (SUTURE) ×5 IMPLANT
SUT PROLENE 4 0 RB 1 (SUTURE) ×28
SUT PROLENE 4-0 RB1 .5 CRCL 36 (SUTURE) IMPLANT
SUT PROLENE 6 0 C 1 30 (SUTURE) ×2 IMPLANT
SUT SILK  1 MH (SUTURE) ×4
SUT SILK 1 MH (SUTURE) IMPLANT
SYSTEM SAHARA CHEST DRAIN ATS (WOUND CARE) ×4 IMPLANT
TAPE CLOTH SURG 4X10 WHT LF (GAUZE/BANDAGES/DRESSINGS) ×1 IMPLANT
TOWEL GREEN STERILE (TOWEL DISPOSABLE) ×4 IMPLANT
TOWEL GREEN STERILE FF (TOWEL DISPOSABLE) ×5 IMPLANT
TRAP SPECIMEN MUCOUS 40CC (MISCELLANEOUS) ×1 IMPLANT
TRAY FOLEY SLVR 16FR TEMP STAT (SET/KITS/TRAYS/PACK) ×4 IMPLANT
TROCAR XCEL BLADELESS 5X75MML (TROCAR) ×1 IMPLANT
TROCAR XCEL NON-BLD 11X100MML (ENDOMECHANICALS) ×7 IMPLANT
TUBE SUCT INTRACARD DLP 20F (MISCELLANEOUS) ×4 IMPLANT
UNDERPAD 30X30 (UNDERPADS AND DIAPERS) ×4 IMPLANT
VALVE AORTIC SZ25 INSP/RESIL (Prosthesis & Implant Heart) ×1 IMPLANT
VENT LEFT HEART 12002 (CATHETERS) ×4
WATER STERILE IRR 1000ML POUR (IV SOLUTION) ×8 IMPLANT
WIRE EMERALD 3MM-J .035X150CM (WIRE) ×1 IMPLANT

## 2018-12-16 NOTE — Anesthesia Preprocedure Evaluation (Signed)
Anesthesia Evaluation  Patient identified by MRN, date of birth, ID band Patient awake    Reviewed: Allergy & Precautions, H&P , NPO status , Patient's Chart, lab work & pertinent test results  Airway Mallampati: II   Neck ROM: full    Dental   Pulmonary sleep apnea , former smoker,    breath sounds clear to auscultation       Cardiovascular hypertension, +CHF  + Valvular Problems/Murmurs AI  Rhythm:regular Rate:Normal     Neuro/Psych PSYCHIATRIC DISORDERS Anxiety Depression    GI/Hepatic GERD  ,  Endo/Other  diabetesobese  Renal/GU Renal InsufficiencyRenal disease     Musculoskeletal  (+) Arthritis ,   Abdominal   Peds  Hematology   Anesthesia Other Findings   Reproductive/Obstetrics                             Anesthesia Physical Anesthesia Plan  ASA: III  Anesthesia Plan: General   Post-op Pain Management:    Induction: Intravenous  PONV Risk Score and Plan: 2 and Ondansetron, Dexamethasone, Midazolam and Treatment may vary due to age or medical condition  Airway Management Planned: Double Lumen EBT  Additional Equipment: Arterial line, CVP, PA Cath, Ultrasound Guidance Line Placement and TEE  Intra-op Plan:   Post-operative Plan: Post-operative intubation/ventilation  Informed Consent: I have reviewed the patients History and Physical, chart, labs and discussed the procedure including the risks, benefits and alternatives for the proposed anesthesia with the patient or authorized representative who has indicated his/her understanding and acceptance.       Plan Discussed with: CRNA, Anesthesiologist and Surgeon  Anesthesia Plan Comments:         Anesthesia Quick Evaluation

## 2018-12-16 NOTE — Anesthesia Procedure Notes (Signed)
Procedure Name: Intubation Date/Time: 12/16/2018 7:54 AM Performed by: Clearnce Sorrel, CRNA Pre-anesthesia Checklist: Patient identified, Emergency Drugs available, Suction available, Patient being monitored and Timeout performed Patient Re-evaluated:Patient Re-evaluated prior to induction Oxygen Delivery Method: Circle system utilized Preoxygenation: Pre-oxygenation with 100% oxygen Induction Type: IV induction Ventilation: Mask ventilation without difficulty and Oral airway inserted - appropriate to patient size Laryngoscope Size: Mac and 4 Grade View: Grade I Tube type: Oral Endobronchial tube: Left, Double lumen EBT and EBT position confirmed by fiberoptic bronchoscope and 41 Fr Number of attempts: 1 Airway Equipment and Method: Stylet Placement Confirmation: ETT inserted through vocal cords under direct vision,  positive ETCO2 and breath sounds checked- equal and bilateral Tube secured with: Tape Dental Injury: Teeth and Oropharynx as per pre-operative assessment

## 2018-12-16 NOTE — Brief Op Note (Addendum)
12/16/2018  12:53 PM  PATIENT:  [name]  63 y.o. male  PRE-OPERATIVE DIAGNOSIS:  Aortic Insufficiency  POST-OPERATIVE DIAGNOSIS:  Aortic Insufficiency  PROCEDURE:  Procedure(s): MINIMALLY INVASIVE AORTIC VALVE REPLACEMENT with a 25 EDWARDS INSPIRIS BIOPSROTHETIC VALVE  TRANSESOPHAGEAL ECHOCARDIOGRAM   SURGEON: Rexene Alberts, MD  PHYSICIAN ASSISTANT: Roddenberry  ANESTHESIA:   general  EBL:  650 mL  BLOOD ADMINISTERED:none  DRAINS: Mediastinal and right pleural tubes   LOCAL MEDICATIONS USED:  NONE  SPECIMEN:  Source of Specimen:  aortic valve leaflets  DISPOSITION OF SPECIMEN:  PATHOLOGY  COUNTS:  YES  DICTATION: .Dragon Dictation  PLAN OF CARE: Admit to inpatient   PATIENT DISPOSITION:  ICU - intubated and hemodynamically stable.   Delay start of Pharmacological VTE agent (>24hrs) due to surgical blood loss or risk of bleeding: yes

## 2018-12-16 NOTE — Progress Notes (Signed)
  Echocardiogram Echocardiogram Transesophageal has been performed.  Charles Riggs 12/16/2018, 10:10 AM

## 2018-12-16 NOTE — Anesthesia Procedure Notes (Signed)
Arterial Line Insertion Start/End7/21/2020 6:50 AM, 12/16/2018 6:57 AM  Patient location: Pre-op. Preanesthetic checklist: patient identified, IV checked, site marked, risks and benefits discussed, surgical consent, monitors and equipment checked, pre-op evaluation, timeout performed and anesthesia consent Lidocaine 1% used for infiltration Left, radial was placed Catheter size: 20 Fr Hand hygiene performed  and maximum sterile barriers used   Attempts: 1 Procedure performed without using ultrasound guided technique. Following insertion, dressing applied. Post procedure assessment: normal and unchanged

## 2018-12-16 NOTE — Interval H&P Note (Signed)
History and Physical Interval Note:  12/16/2018 6:34 AM  Charles Riggs.  has presented today for surgery, with the diagnosis of Aortic Insufficiency.  The various methods of treatment have been discussed with the patient and family. After consideration of risks, benefits and other options for treatment, the patient has consented to  Procedure(s): MINIMALLY INVASIVE AORTIC VALVE REPLACEMENT (AVR) (N/A) TRANSESOPHAGEAL ECHOCARDIOGRAM (TEE) (N/A) as a surgical intervention.  The patient's history has been reviewed, patient examined, no change in status, stable for surgery.  I have reviewed the patient's chart and labs.  Questions were answered to the patient's satisfaction.     Rexene Alberts

## 2018-12-16 NOTE — Op Note (Signed)
CARDIOTHORACIC SURGERY OPERATIVE NOTE  Date of Procedure:  12/16/2018  Preoperative Diagnosis:   Quadricuspid Aortic Valve  Severe Aortic Insufficiency  Postoperative Diagnosis: Same   Procedure:    Minimally Invasive Aortic Valve Replacement Right Anterior Mini-thoracotomy Edwards Inspiris Resilia Stented Bovine Pericardial Tissue Valve (size 76mm, model #11500A, serial #[id number]) Plating of right 3rd costal cartilage (KLS titanium plate)   Surgeon: Valentina Gu. Roxy Manns, MD  Assistant: Enid Cutter, PA-C  Anesthesia: Albertha Ghee, MD  Operative Findings:  Quadricuspid aortic valve  Severe aortic insufficiency  Low normal LV systolic function            BRIEF CLINICAL NOTE AND INDICATIONS FOR SURGERY  Patient is a 63 year old African-American male with history ofquadricuspid aortic valve andsevere aortic insufficiency, hypertension, left bundle branch block, asthma, obstructive sleep apnea, abnormal stress test and remote history of tobacco use whoreturns to the office today for follow-up discussion regardingtreatment options for management of severe aortic insufficiency.  Patient states that he has had some degree of exertional shortness of breath that has slowly progressed for years. He denies any previous history of heart murmur or rheumatic fever. Over the past year symptoms of exertional shortness of breath progressed, and approximately 3 months ago he developed more severe episodes of exertional shortness of breath with occasional paroxysmal episodes of resting shortness of breath. Initially he was thought to be suffering from asthma. However, he began to experience lower extremity edema and orthopnea. He was referred for cardiology consultation and seen by Dr. Kandis Ban early February. An echocardiogram was performed that reportedly demonstrated the presence of severe aortic insufficiency with normal left ventricular size and systolic function.  Paradoxically, a stress test was performed and reported to be high risk due to the presence of low ejection fraction. He was notably hypertensive at the time but reportedly not experiencing active symptoms of chest pain or shortness of breath. The patient was started on nifedipine and Lasix at that time.   The patient states that he took his medications for approximately 2 weeks but subsequently stopped because he could not afford to fill the prescriptions. Initially he did well but after he stopped taking his medications he began to experience worsening shortness of breath. Last week he began to experience lower extremity edema. He resumed taking Lasix 2 days ago. Early this morning the patient was awoken from sleep with burning substernal chest discomfort and shortness of breath. He was brought via EMS to the emergency department where baseline EKG revealed sinus rhythm with left bundle branch block. Chest x-ray revealed mild pulmonary edema. BNP was elevated 300. Initial troponin was negative. Oxygen saturation was in the 80s. Symptoms improved with IV Lasix. He was admitted to the hospital for observation and follow-up troponin level was slightly increased to 0.46from <0.03 at baseline.Cardiothoracic surgical consultation was requestedand I initially saw the patient in consultation on August 27, 2018. Cardiac gated CT angiogram was performed and confirmed the presence of a quadricuspid aortic valve and revealed what was felt to be mild nonobstructive coronary artery disease, although images were suboptimal and visualization of the right coronary artery was limited. Clinically the patient improved rapidly with intravenous diuresis and he was ultimately discharged home with plans for close medical follow-up and diagnostic cardiac catheterization at a delayed date because of the ongoing COVID-19 pandemic. The patient subsequently underwent left and right heart catheterization on Oct 14, 2018. He was found to have normal coronary arteries with no significant coronary artery disease. There was mild pulmonary  hypertension.   The patient has been seen in consultation and counseled at length regarding the indications, risks and potential benefits of surgery.  All questions have been answered, and the patient provides full informed consent for the operation as described.   DETAILS OF THE OPERATIVE PROCEDURE  Preparation:  The patient is brought to the operating room on the above mentioned date and central monitoring was established by the anesthesia team including placement of Swan-Ganz catheter through the left internal jugular vein.  A radial arterial line is placed. The patient is placed in the supine position on the operating table.  Intravenous antibiotics are administered. General endotracheal anesthesia is induced uneventfully. The patient is initially intubated using a dual lumen endotracheal tube.  A Foley catheter is placed.  Baseline transesophageal echocardiogram was performed.  Findings were notable for a congenitally quadricuspid aortic valve with 4 distinctly separate leaflets.  There was severe aortic insufficiency.  There was mild LV systolic dysfunction and mild LV hypertrophy.  The patient is placed in the supine position with their neck gently extended and turned to the left.   The patient's right neck, chest, abdomen, both groins, and both lower extremities are prepared and draped in a sterile manner. A time out procedure is performed.   Percutaneous Vascular Access:  Percutaneous arterial and venous access were obtained on the right side.  Using ultrasound guidance the right common femoral vein was cannulated using the Seldinger technique and a pair of Perclose vascular closure devises were placed, after which time an 8 French sheath inserted.  The right common femoral artery was cannulated using a micropuncture wire and sheath.  A pair of Perclose vascular  closure devices were placed at opposing 30 degree angles in the femoral artery, and a 8 French sheath inserted.  The right internal jugular vein was cannulated  using ultrasound guidance and an 8 French sheath inserted.     Surgical Approach:  A right miniature anteriorthoracotomy incision is performed. The incision is placed immediately over the 3rd intercostal space.  The muscle fibers of the pectoralis major muscle are split longitudinally and the right pleural space is entered through the 3rd intercostal space. The 3rd costal cartilage is divided at it's insertion onto the sternum.  The right internal mammary artery and veins are ligated and divided.  A soft tissue retractor is placed.  Two 11 mm ports are placed through separate stab incisions inferiorly. The right pleural space is insufflated continuously with carbon dioxide gas through the posterior port during the remainder of the operation.     Extracorporeal Cardiopulmonary Bypass and Myocardial Protection:  The patient was heparinized systemically.  The right common femoral vein is cannulated through the venous sheath and a guidewire advanced into the right atrium using TEE guidance.  The femoral vein cannulated using a 22 Fr long femoral venous cannula.  The right common femoral artery is cannulated through the arterial sheath and a guidewire advanced into the descending thoracic aorta using TEE guidance.  Femoral artery is cannulated with a 18 French femoral arterial cannula.  The right internal jugular vein is cannulated through the venous sheath and a guidewire advanced into the right atrium.  The internal jugular vein is cannulated using a 14 French pediatric femoral venous cannula.   Adequate heparinization is verified.      The entire pre-bypass portion of the operation was notable for stable hemodynamics.  Cardiopulmonary bypass was begun.  Vacuum assist venous drainage is utilized. An incision is made in the  pericardium over the  ascending aorta and extended in both directions. Silk traction sutures are placed to retract the pericardium.  Venous drainage and exposure are notably excellent. Attempts are made to place a retrograde cardioplegia cannula through the right atrium into the coronary sinus using transesophageal echocardiogram guidance.  However, the orifice of the coronary sinus is relatively small.  Subsequently umbilical tapes were placed around the superior vena cava and the inferior vena cava and a small incision is made in the lateral wall of the right atrium.  Retrograde cardioplegia cannula is then placed under direct vision into the coronary sinus.  A left ventricular vent is placed through the right superior pulmonary vein.  An antegrade cardioplegia cannula is placed in the ascending aorta.    The patient is cooled to 28C systemic temperature.  The aortic cross clamp is applied and cardioplegia is delivered initially in a retrograde fashion through the coronary sinus catheter using modified [place] cold blood cardioplegia ([place] blood cardioplegia protocol).   The initial cardioplegic arrest is rapid with early diastolic arrest.  A repeat dose of cardioplegia is administered directly into the left main coronary artery after excision of the aortic valve leaflets.  Additional repeat doses are administered every 30 minutes thereafter through the coronary sinus catheter in order to maintain completely flat electrocardiogram.  Myocardial protection was felt to be excellent.   Aortic Valve Replacement:  An oblique transverse aortotomy incision was performed.  The aortic valve was inspected and notable for a congenitally quadricuspid aortic valve.  There are 4 distinct leaflets with 4 separate commissures.  The left main and the right coronary arteries are notably 180 degrees opposed from each other.  The free margins of the leaflets were fibrotic with nodular retraction and the valve was completely incompetent.   There was moderately restricted leaflet mobility involving all 4 leaflets.  The aortic valve leaflets were excised sharply.  Decalcification of the annulus was not required..  The aortic annulus was sized to accept a 25 mm prosthesis.  The aortic root and left ventricle were irrigated with copious cold saline solution.  Aortic valve replacement was performed using interrupted horizontal mattress 2-0 Ethibond pledgeted sutures with pledgets in the subannular position.  An Edwards Inspiris Resilia stented bovine pericardial tissue valve (size 25 mm, model # 11500A, serial # [id number]) was implanted uneventfully. The valve seated appropriately with adequate space beneath the left main and right coronary artery.   Procedure Completion:  The aortotomy was closed using a 2-layer closure of running 4-0 Prolene suture.  One final dose of warm retrograde "reanimation dose" cardioplegia was administered retrograde through the coronary sinus catheter while all air was evacuated through the aortic root.  The aortic cross clamp was removed after a total cross clamp time of 93 minutes.  Epicardial pacing wires are fixed to the right ventricular outflow tract and to the right atrial appendage. The patient is rewarmed to 37C temperature.  The pericardial sac was drained using a 28 French Bard drain placed through the anterior port incision.   The patient is weaned and disconnected from cardiopulmonary bypass.  The patient's rhythm at separation from bypass was sinus bradycardia.  The patient was weaned from bypass without any inotropic support. Total cardiopulmonary bypass time for the operation was 176 minutes.  Followup transesophageal echocardiogram performed after separation from bypass revealed a well-seated bioprosthetic tissue valve in the aortic position with no perivalvular leak.  Left ventricular function was unchanged from preoperatively.    The  femoral arterial and venous cannulas were removed and all  Perclose sutures secured.  Manual pressure was maintained while Protamine was administered.  Single lung ventilation was begun. The aortotomy closure was inspected for hemostasis. The right pleural space is irrigated with saline solution and inspected for hemostasis. The right pleural space was drained using a 28 French Bard drain placed through the posterior port incision. The 3rd costal cartilage is replaced using a small KLS titanium plate.  The miniature thoracotomy incision was closed in multiple layers in routine fashion.    The post-bypass portion of the operation was notable for stable rhythm and hemodynamics.   No blood products were administered during the operation.   Disposition:  The patient tolerated the procedure well.  The patient was extubated in the operating room and subsequently transported to the surgical intensive care unit in stable condition. There were no intraoperative complications. All sponge instrument and needle counts are verified correct at completion of the operation.     Valentina Gu. Roxy Manns MD 12/16/2018 1:59 PM

## 2018-12-16 NOTE — Anesthesia Procedure Notes (Signed)
Central Venous Catheter Insertion Performed by: Albertha Ghee, MD, anesthesiologist Start/End7/21/2020 6:55 AM, 12/16/2018 7:04 AM Patient location: Pre-op. Preanesthetic checklist: patient identified, IV checked, site marked, risks and benefits discussed, surgical consent, monitors and equipment checked, pre-op evaluation, timeout performed and anesthesia consent Position: Trendelenburg Lidocaine 1% used for infiltration and patient sedated Hand hygiene performed , maximum sterile barriers used  and Seldinger technique used Catheter size: 8.5 Fr Central line was placed.Sheath introducer Swan type:thermodilation Procedure performed using ultrasound guided technique. Ultrasound Notes:anatomy identified, needle tip was noted to be adjacent to the nerve/plexus identified, no ultrasound evidence of intravascular and/or intraneural injection and image(s) printed for medical record Attempts: 1 Following insertion, line sutured, dressing applied and Biopatch. Post procedure assessment: blood return through all ports, free fluid flow and no air  Patient tolerated the procedure well with no immediate complications.

## 2018-12-16 NOTE — Transfer of Care (Signed)
Immediate Anesthesia Transfer of Care Note  Patient: [name].  Procedure(s) Performed: MINIMALLY INVASIVE AORTIC VALVE REPLACEMENT (AVR) using Inspiris Aortic valve 6mm. (N/A Chest) TRANSESOPHAGEAL ECHOCARDIOGRAM (TEE) (N/A ) Rib Plating  Patient Location: SICU  Anesthesia Type:General  Level of Consciousness: sedated  Airway & Oxygen Therapy: Patient Spontanous Breathing and Patient connected to face mask oxygen  Post-op Assessment: Report given to RN and Post -op Vital signs reviewed and stable  Post vital signs: Reviewed and stable  Last Vitals:  Vitals Value Taken Time  BP    Temp    Pulse    Resp    SpO2      Last Pain:  Vitals:   12/16/18 0609  TempSrc:   PainSc: 0-No pain      Patients Stated Pain Goal: 3 (64/40/34 7425)  Complications: No apparent anesthesia complications

## 2018-12-16 NOTE — Progress Notes (Signed)
     [address]       [place] [address]             [phone]       Looks good  Vitals:   12/16/18 1800 12/16/18 1900  BP: 117/73 120/73  Pulse: 79 79  Resp: 10 16  Temp: 99.3 F (37.4 C) 99.5 F (37.5 C)  SpO2: 99% 98%   PAP: (27-35)/(17-20) 27/17 CO:  [4.3 L/min-5.5 L/min] 5.5 L/min CI:  [1.9 L/min/m2-2.4 L/min/m2] 2.4 L/min/m2 I/O last 3 completed shifts: In: 4449.6 [I.V.:3132.7; Blood:600; IV Piggyback:716.9] Out: 2560 [Urine:1620; Blood:650; Chest Tube:290]

## 2018-12-17 ENCOUNTER — Inpatient Hospital Stay (HOSPITAL_COMMUNITY): Payer: No Typology Code available for payment source

## 2018-12-17 ENCOUNTER — Encounter (HOSPITAL_COMMUNITY): Payer: Self-pay | Admitting: Thoracic Surgery (Cardiothoracic Vascular Surgery)

## 2018-12-17 LAB — CBC
HCT: 25.7 % — ABNORMAL LOW (ref 39.0–52.0)
HCT: 25.2 % — ABNORMAL LOW (ref 39.0–52.0)
Hemoglobin: 7.8 g/dL — ABNORMAL LOW (ref 13.0–17.0)
Hemoglobin: 7.5 g/dL — ABNORMAL LOW (ref 13.0–17.0)
MCH: 24.3 pg — ABNORMAL LOW (ref 26.0–34.0)
MCH: 24 pg — ABNORMAL LOW (ref 26.0–34.0)
MCHC: 30.4 g/dL (ref 30.0–36.0)
MCHC: 29.8 g/dL — ABNORMAL LOW (ref 30.0–36.0)
MCV: 80.1 fL (ref 80.0–100.0)
MCV: 80.8 fL (ref 80.0–100.0)
Platelets: 192 K/uL (ref 150–400)
Platelets: 205 K/uL (ref 150–400)
RBC: 3.21 MIL/uL — ABNORMAL LOW (ref 4.22–5.81)
RBC: 3.12 MIL/uL — ABNORMAL LOW (ref 4.22–5.81)
RDW: 14.1 % (ref 11.5–15.5)
RDW: 14.3 % (ref 11.5–15.5)
WBC: 9.1 K/uL (ref 4.0–10.5)
WBC: 9 K/uL (ref 4.0–10.5)
nRBC: 0 % (ref 0.0–0.2)
nRBC: 0 % (ref 0.0–0.2)

## 2018-12-17 LAB — BASIC METABOLIC PANEL
Anion gap: 5 (ref 5–15)
Anion gap: 8 (ref 5–15)
BUN: 15 mg/dL (ref 8–23)
BUN: 24 mg/dL — ABNORMAL HIGH (ref 8–23)
CO2: 24 mmol/L (ref 22–32)
CO2: 23 mmol/L (ref 22–32)
Calcium: 8.1 mg/dL — ABNORMAL LOW (ref 8.9–10.3)
Calcium: 8.2 mg/dL — ABNORMAL LOW (ref 8.9–10.3)
Chloride: 110 mmol/L (ref 98–111)
Chloride: 104 mmol/L (ref 98–111)
Creatinine, Ser: 1.46 mg/dL — ABNORMAL HIGH (ref 0.61–1.24)
Creatinine, Ser: 1.99 mg/dL — ABNORMAL HIGH (ref 0.61–1.24)
GFR calc Af Amer: 59 mL/min — ABNORMAL LOW (ref >60)
GFR calc Af Amer: 41 mL/min — ABNORMAL LOW (ref >60)
GFR calc non Af Amer: 51 mL/min — ABNORMAL LOW (ref >60)
GFR calc non Af Amer: 35 mL/min — ABNORMAL LOW (ref >60)
Glucose, Bld: 143 mg/dL — ABNORMAL HIGH (ref 70–99)
Glucose, Bld: 154 mg/dL — ABNORMAL HIGH (ref 70–99)
Potassium: 5.2 mmol/L — ABNORMAL HIGH (ref 3.5–5.1)
Potassium: 4.4 mmol/L (ref 3.5–5.1)
Sodium: 139 mmol/L (ref 135–145)
Sodium: 135 mmol/L (ref 135–145)

## 2018-12-17 LAB — MAGNESIUM
Magnesium: 2.8 mg/dL — ABNORMAL HIGH (ref 1.7–2.4)
Magnesium: 2.9 mg/dL — ABNORMAL HIGH (ref 1.7–2.4)

## 2018-12-17 LAB — GLUCOSE, CAPILLARY
Glucose-Capillary: 136 mg/dL — ABNORMAL HIGH (ref 70–99)
Glucose-Capillary: 144 mg/dL — ABNORMAL HIGH (ref 70–99)
Glucose-Capillary: 118 mg/dL — ABNORMAL HIGH (ref 70–99)
Glucose-Capillary: 114 mg/dL — ABNORMAL HIGH (ref 70–99)
Glucose-Capillary: 135 mg/dL — ABNORMAL HIGH (ref 70–99)
Glucose-Capillary: 145 mg/dL — ABNORMAL HIGH (ref 70–99)

## 2018-12-17 MED ORDER — INSULIN ASPART 100 UNIT/ML ~~LOC~~ SOLN
0.0000 [IU] | SUBCUTANEOUS | Status: DC
  Administered 2018-12-17 – 2018-12-18 (×5): 2 [IU] via SUBCUTANEOUS

## 2018-12-17 MED ORDER — DEXMEDETOMIDINE HCL IN NACL 400 MCG/100ML IV SOLN: 0.1000 ug/kg/h | INTRAVENOUS | Status: DC

## 2018-12-17 MED ORDER — GABAPENTIN 600 MG PO TABS
600.0000 mg | ORAL_TABLET | Freq: Three times a day (TID) | ORAL | Status: DC
  Administered 2018-12-17 – 2018-12-20 (×10): 600 mg via ORAL
  Filled 2018-12-17 (×10): qty 1

## 2018-12-17 MED ORDER — ROSUVASTATIN CALCIUM 5 MG PO TABS
10.0000 mg | ORAL_TABLET | Freq: Every day | ORAL | Status: DC
  Administered 2018-12-18 – 2018-12-19 (×2): 10 mg via ORAL
  Filled 2018-12-17 (×2): qty 2

## 2018-12-17 MED ORDER — FUROSEMIDE 10 MG/ML IJ SOLN
20.0000 mg | Freq: Four times a day (QID) | INTRAMUSCULAR | Status: AC
  Administered 2018-12-17 (×3): 20 mg via INTRAVENOUS
  Filled 2018-12-17 (×3): qty 2

## 2018-12-17 MED ORDER — ENOXAPARIN SODIUM 30 MG/0.3ML ~~LOC~~ SOLN
30.0000 mg | Freq: Every day | SUBCUTANEOUS | Status: DC
  Administered 2018-12-18 – 2018-12-19 (×2): 30 mg via SUBCUTANEOUS
  Filled 2018-12-17 (×2): qty 0.3

## 2018-12-17 NOTE — Progress Notes (Signed)
[facility] BRIEF SICU PROGRESS NOTE  1 Day Post-Op  S/P Procedure(s) (LRB): MINIMALLY INVASIVE AORTIC VALVE REPLACEMENT (AVR) using Inspiris Aortic valve 57mm. (N/A) TRANSESOPHAGEAL ECHOCARDIOGRAM (TEE) (N/A) Rib Plating   Looks great NSR w/ stable BP Breathing comfortably on room air UOP adequate Labs okay although creatinine up slightly and Hgb 7.5  Plan: Continue current plan  Rexene Alberts, MD 12/17/2018 7:12 PM

## 2018-12-17 NOTE — Discharge Summary (Signed)
Physician Discharge Summary  Patient ID: Charles Riggs. MRN: [id number] DOB/AGE: 1955/07/17 63 y.o.  Admit date: 12/16/2018 Discharge date: 12/20/2018  Admission Diagnoses: Severe aortic insufficiency  Nonrheumatic aortic valve insufficiency  Essential hypertension  Obstructive sleep apnea  Chronic kidney disease (CKD)  Anemia in chronic kidney disease  Discharge Diagnoses:  Principal Problem:   S/P minimally invasive aortic valve replacement with bioprosthetic valve Active Problems:   Severe aortic insufficiency   Nonrheumatic aortic valve insufficiency   Essential hypertension   Obstructive sleep apnea   Quadricuspid aortic valve   Chronic kidney disease (CKD)   Anemia in chronic kidney disease   Expected acute blood loss anemia  Discharged Condition: stable  HISTORY OF PRESENT ILLNESS  Patient is a 63 year old African-American male with history ofquadricuspid aortic valve andsevere aortic insufficiency, hypertension, left bundle branch block, asthma, obstructive sleep apnea, abnormal stress test and remote history of tobacco use whoreturns to the office today for follow-up discussion regardingtreatment options for management of severe aortic insufficiency.  Patient states that he has had some degree of exertional shortness of breath that has slowly progressed for years. He denies any previous history of heart murmur or rheumatic fever. Over the past year symptoms of exertional shortness of breath progressed, and approximately 3 months ago he developed more severe episodes of exertional shortness of breath with occasional paroxysmal episodes of resting shortness of breath. Initially he was thought to be suffering from asthma. However, he began to experience lower extremity edema and orthopnea. He was referred for cardiology consultation and seen by Dr. Kandis Ban early February. An echocardiogram was performed that reportedly demonstrated the presence of severe  aortic insufficiency with normal left ventricular size and systolic function. Paradoxically, a stress test was performed and reported to be high risk due to the presence of low ejection fraction. He was notably hypertensive at the time but reportedly not experiencing active symptoms of chest pain or shortness of breath. The patient was started on nifedipine and Lasix at that time.   The patient states that he took his medications for approximately 2 weeks but subsequently stopped because he could not afford to fill the prescriptions. Initially he did well but after he stopped taking his medications he began to experience worsening shortness of breath. Last week he began to experience lower extremity edema. He resumed taking Lasix 2 days ago. Early this morning the patient was awoken from sleep with burning substernal chest discomfort and shortness of breath. He was brought via EMS to the emergency department where baseline EKG revealed sinus rhythm with left bundle branch block. Chest x-ray revealed mild pulmonary edema. BNP was elevated 300. Initial troponin was negative. Oxygen saturation was in the 80s. Symptoms improved with IV Lasix. He was admitted to the hospital for observation and follow-up troponin level was slightly increased to 0.66from <0.03 at baseline.Cardiothoracic surgical consultation was requestedand I initially saw the patient in consultation on August 27, 2018. Cardiac gated CT angiogram was performed and confirmed the presence of a quadricuspid aortic valve and revealed what was felt to be mild nonobstructive coronary artery disease, although images were suboptimal and visualization of the right coronary artery was limited. Clinically the patient improved rapidly with intravenous diuresis and he was ultimately discharged home with plans for close medical follow-up and diagnostic cardiac catheterization at a delayed date because of the ongoing COVID-19 pandemic. The  patient subsequently underwent left and right heart catheterization on Oct 14, 2018. He was found to have normal coronary arteries  with no significant coronary artery disease. There was mild pulmonary hypertension. The patient was seen in consultation by Dr. Dorothyann Gibbs in the dental clinic and ultimately underwent dental extraction by an oral surgeon. He has been cleared to proceed with aortic valve surgery. He returns to our office today for follow-up.  Patient is married and lives locally in [place] with his wife. He works as a Administrator. He states that he still exercises on a regular basis, but this is primarily weight training. He notes that he had to give up any kind of aerobic exercise several years ago because of exertional shortness of breath. He gets short of breath with low-level aerobic activity. He has had some substernal chest discomfort with strenuous exertion.He reports that since hospital discharge he has remained essentially stable, although he did go to the emergency departmenton Oct 19, 2018 complaining of mild tightness across his chest. He was treated and released. He now gets short of breath with moderate level activity. He has not had palpitations, dizzy spells, nor syncope. He denies any recent febrile illness or cough. 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 He has not been exposed to any persons who have known or suspected COVID-19 infection.His biggest complaint at present is that of acute exacerbation of pain in his right hip that is reportedly related to degenerative arthritis. He has been told that he will likely need right hip replacement at some point. He has been scheduled for possible cortisone injection of his hip but this has not yet been performed.  Patient is a 63 year old African-American male with history of quadricuspid aortic valve and severe aortic insufficiency, hypertension, left bundle branch block, asthma,  obstructive sleep apnea, and remote history of tobacco use who returns to the office today for follow-up of severe symptomatic aortic insufficiency. He was initially seen in consultation on August 27, 2018 and he was last seen here in our office on November 03, 2018. Since then he underwent transesophageal echocardiogram to further evaluate whether or not an attempt at valve repair might be feasible. He also underwent CT angiography to evaluate the feasibility of peripheral cannulation for surgery. He has been referred for steroid injection of his left hip because of severe degenerative arthritis with ongoing pain, and hip injection has been scheduled for December 10, 2018. He returns to the office today to review the results of his tests and hopefully schedule surgery soon after his hip injection has been performed with intention that improved pain relief related to his hip will facilitate his postoperative recovery following surgery. He reports no new problems or complaints. He remains severely limited by pain in his left hip. He remains ambulatory but it is really bothering him considerably. He understands that he will likely need hip replacement once his aortic valve is been definitively treated. The remainder of his review of systems is unchanged.   Hospital Course:   Mr. Brackins was admitted for same-day surgery on 12/16/18 and taken to the OR where mini aortic valve replacement was carried out utilizing a 25 Edwards Inspiris bioprsthetic valve.  He separated from cardiopulmonary bypass with difficulty and was transferred to the CVICU in stable condition. He remained hemodynamically stable and required no inotropic or vasoactive support. He was extubated routinely. He initially had a sinus bradycardia and required pacing utilizing the epicardial leads. B-blocker was held. On POD-1, he was mobilized with physical therapy. He made slow progress with his mobility due to his chronic back and  right hip pain. His  cardiac rhythm remained stable. He had expected acute blood loss anemia following surgery and was treated with Trinsicon.  His CXR remained stable with no significant pleural effusion and clear lung fields.His incisions were healing with no sign of complication at discharge. Arrangements were made for a rolling walker for home use prior to discharge.    Consults: None  Significant Diagnostic Studies:   ECHOCARDIOGRAM LIMITED REPORT     Patient Name: Charles Riggs Date of Exam: 08/27/2018 Medical Rec #: [id number] Height: 71.0 in Accession #: [id number] Weight: 254.5 lb Date of Birth: 1956/03/16 BSA: 2.34 m Patient Age: 59 years BP: 126/58 mmHg Patient Gender: M HR: 85 bpm. Exam Location: Inpatient   Procedure: 2D Echo, Cardiac Doppler and Color Doppler  Indications: AV insufficiency 424.10 / I35.9  History: Patient has prior history of Echocardiogram examinations, most recent 07/23/2018. Diabetes, Asthma.  Sonographer: Madelaine Etienne RDCS (AE) Referring Phys: [id number] [facility] [name] Diagnosing Phys: Vernell Leep MD  IMPRESSIONS   1. The left ventricle has low normal systolic function, with an ejection fraction of 50-55%. The cavity size was mildly dilated. 2. The right ventricle has normal systolc function. The cavity was normal. 3. The asending aorta measures upper limit of normal at 3.7 cm. No dissection seen. 4. Trieleaflet aortic valve with mild calcification. Severe aortic valve regurgitation. 5. Compared to previous study in 06/2018, LV is now mildly dilated.  FINDINGS Left Ventricle: The left ventricle has low normal systolic function, with an ejection fraction of 50-55%. The cavity size was mildly dilated.  Right Ventricle: The right ventricle has normal systolic function. The cavity was normal. Left Atrium: Left atrial  size was normal in size. Left Atrial Appendage: Right Atrium: Right atrial size was normal in size. Pericardium: There is no evidence of pericardial effusion. Mitral Valve: The mitral valve is normal in structure. Tricuspid Valve: The tricuspid valve was normal in structure. Tricuspid valve regurgitation was not visualized by color flow Doppler. Aortic Valve: The aortic valve is tricuspid Mild calcification of the aortic valve. Aortic valve regurgitation is severe by color flow Doppler. There is no evidence of aortic valve stenosis. Pulmonic Valve: The pulmonic valve was grossly normal. Aorta: The asending aorta measures upper limit of normal at 3.7 cm. No dissection seen. is normal in size and structure.  LEFT VENTRICLE PLAX 2D LVIDd: 5.40 cm LVIDs: 3.80 cm LV PW: 1.00 cm LV IVS: 0.90 cm LV SV: 79 ml LV SV Index: 32.45  LV Volumes (MOD) LV area d, A2C: 60.50 cm LV area d, A4C: 49.00 cm LV area s, A2C: 41.00 cm LV area s, A4C: 37.50 cm LV major d, A2C: 10.20 cm LV major d, A4C: 9.41 cm LV major s, A2C: 9.40 cm LV major s, A4C: 8.85 cm LV vol d, MOD A2C: 293.0 ml LV vol d, MOD A4C: 207.0 ml LV vol s, MOD A2C: 152.0 ml LV vol s, MOD A4C: 133.0 ml LV SV MOD A2C: 141.0 ml LV SV MOD A4C: 207.0 ml LV SV MOD BP: 108.9 ml  LEFT ATRIUM Index LA diam: 3.00 cm 1.28 cm/m AORTIC VALVE AV Vmax: 240.67 cm/s AV Vmean: 163.333 cm/s AV VTI: 0.451 m AV Peak Grad: 23.2 mmHg AV Mean Grad: 12.3 mmHg LVOT Vmax: 151.00 cm/s LVOT Vmean: 105.000 cm/s LVOT VTI: 0.305 m LVOT/AV VTI ratio: 0.68 AR PHT: 553 msec  AORTA Ao Root diam: 3.20 cm Ao Asc diam: 3.70 cm   SHUNTS Systemic VTI: 0.30 m  Vernell Leep MD Electronically signed by Vernell Leep MD Signature Date/Time: 08/27/2018/6:52:37  PM    RIGHT/LEFT HEART CATH AND CORONARY ANGIOGRAPHY  Conclusion   LM: Normal LAD: Normal Ramus: Normal LCx: Normal RCA: Normal  Angiographically normal coronary arteries with no significant CAD Mild pulmonary hypertnension mean PA 26 mmHg  Recommendation: Follow up with Dr. Roxy Manns re: aortic valve replacement  Nigel Mormon, MD [facility] Pager: [phone] Office: [phone] If no answer Cell [phone]    Recommendations   Antiplatelet/Anticoag In light of recent NSTEMI and anticipated aortic valve replacement.  Procedural Details   Technical Details Procedures: 1. Right heart catheterization 2. Left heart catheterization 3. Selective left and right coronary angiography 4. Conscious sedation monitoring 36 min  Indication: Severe aortic regurgitation  History: 63 year old African-American male, 61 PY former smoker, OSA, LBBB, h/o asthma, severe AI with quadricuspid aortic valve.  Diagnostic Angiography: Catheter/s advances over guidewire under fluoroscopy Left coronary artery: 5 Fr JL 3.5  Right coronary artery: 5 Fr JR 4 Left heart catheterization: 5 Fr JR 4  Pressures tracings obtained in right atrium, right ventricle, pulmonary artery, and pulmonary capillary wedge position.   Anticoagulation:  6000 units heparin  Hemostasis: TR band  Total contrast used: 45 cc   Total fluoro time: 5.0 min Air Kerma: 754 mGy  All wires and catheters removed out of the body at the end of the procedure Final angiogram showed no dissection/perforation         Estimated blood loss <50 mL.   During this procedure medications were administered to achieve and maintain moderate conscious sedation while the patient's heart rate, blood pressure, and oxygen saturation were continuously monitored and I was present face-to-face 100% of this time.  Medications  (Filter: Administrations occurring from 10/14/18 1009 to 10/14/18  1114)          Medication Rate/Dose/Volume Action  Date Time   Heparin (Porcine) in NaCl 1000-0.9 UT/500ML-% SOLN (mL) 500 mL Given 10/14/18 1020   Total dose as of 11/03/18 1631        500 mL        Heparin (Porcine) in NaCl 1000-0.9 UT/500ML-% SOLN (mL) 500 mL Given 10/14/18 1020   Total dose as of 11/03/18 1631        500 mL        fentaNYL (SUBLIMAZE) injection (mcg) 50 mcg Given 10/14/18 1028   Total dose as of 11/03/18 1631 50 mcg Given 1038   100 mcg        midazolam (VERSED) injection (mg) 1 mg Given 10/14/18 1028   Total dose as of 11/03/18 1631        1 mg        lidocaine (PF) (XYLOCAINE) 1 % injection (mL) 5 mL Given 10/14/18 1035   Total dose as of 11/03/18 1631        5 mL        Radial Cocktail/Verapamil only (mL) 10 mL Given 10/14/18 1041   Total dose as of 11/03/18 1631        10 mL        heparin injection (Units) 6,000 Units Given 10/14/18 1048   Total dose as of 11/03/18 1631        6,000 Units        iohexol (OMNIPAQUE) 350 MG/ML injection (mL) 45 mL Given 10/14/18 1106   Total dose as of 11/03/18 1631        45 mL  Sedation Time   Sedation Time Physician-1: 36 minutes 39 seconds  Complications      Complications documented before study signed (10/14/2018 11:17 AM EDT)     RIGHT/LEFT HEART CATH AND CORONARY ANGIOGRAPHY   None Documented by Nigel Mormon, MD 10/14/2018 11:12 AM EDT  Time Range: Intraprocedure       Coronary Findings   Diagnostic  Dominance: Right  Left Main  Vessel is normal in caliber. Vessel is angiographically normal.  Left Anterior Descending  Vessel is normal in caliber. Vessel is angiographically normal.  Ramus Intermedius  Vessel is normal in caliber. Vessel is angiographically normal.  Left Circumflex  Vessel is normal in caliber. Vessel is angiographically normal.  Right Coronary Artery  Vessel  is normal in caliber. Vessel is angiographically normal.  Intervention   No interventions have been documented.  Right Heart   Right Heart Pressures RA: 7 mmHg RV: 36/6 mmHg PA: 36/6 mmHg. Mean PA 26 mmHg PCWP: 14 mmHg LVEDP `19 mmHg  Left Heart   Left Ventricle LV end diastolic pressure is mildly elevated.  Coronary Diagrams   Diagnostic  Dominance: Right      Procedure: Transesophageal Echo  Indications: Aortic valve disorder 424.1 / I35.9  History: Patient has prior history of Echocardiogram examinations, most recent 08/27/2018. Quadricuspid aortic valve and Aortic Valve Disease Signs/Symptoms: Chest Pain Risk Factors: Hypertension and Diabetes. Obstructive sleep apnea COCAINE ABUSE, HX OF.  Sonographer: Talmage Coin Referring Phys: [id number] [facility] J [name] Diagnosing Phys: Vernell Leep MD Report CC'd to: Vernell Leep MD    PROCEDURE: Local oropharyngeal anesthetic was provided with Benzocaine spray. Patients was under conscious sedation during this procedure. Anesthetic was administered intravenously by performing Physician: 30mcg of Fentanyl, 1.0mg  of Versed. The  transesophogeal probe was passed through the esophogus of the patient. Image quality was good. The patient's vital signs; including heart rate, blood pressure, and oxygen saturation; remained stable throughout the procedure. The patient developed no  complications during the procedure.  IMPRESSIONS   1. The left ventricle has mildly reduced systolic function, with an ejection fraction of 45-50%. The cavity size was moderately dilated. No evidence of left ventricular regional wall motion abnormalities. 2. Quadricuspid aortic valve with central coaptation defect and severe aortic insufficiency. Diastolic flow reversal seen in descending aorta. 3. There is mild dilatation of the ascending  aorta measuring 39 mm.  FINDINGS Left Ventricle: The left ventricle has mildly reduced systolic function, with an ejection fraction of 45-50%. The cavity size was moderately dilated. No evidence of left ventricular regional wall motion abnormalities.  Right Ventricle: The right ventricle has normal systolic function.  Left Atrium: Left atrial size was normal in size.   Right Atrium: Right atrial size was normal in size. Right atrial pressure is estimated at 10 mmHg.  Pericardium: There is no evidence of pericardial effusion.  Mitral Valve: The mitral valve is grossly normal. Mitral valve regurgitation is trivial by color flow Doppler.  Tricuspid Valve: The tricuspid valve was grossly normal. Tricuspid valve regurgitation was not visualized by color flow Doppler.  Aortic Valve: Quadricuspid aortic valve with central coaptation defect and severe aortic insufficiency. Diastolic flow reversal seen in descending aorta.  Pulmonic Valve: The pulmonic valve was grossly normal. Pulmonic valve regurgitation is not visualized by color flow Doppler.  Aorta: There is mild dilatation of the ascending aorta measuring 39 mm.   +------------+--------++  AORTIC VALVE     +------------+--------++  AR PHT:  292 msec   +------------+--------++  Manish [name] MD Electronically signed by Vernell Leep MD Signature Date/Time: 11/11/2018/10:03:52 AM     CT ANGIOGRAPHY CHEST, ABDOMEN AND PELVIS  TECHNIQUE: Multidetector CT imaging through the chest, abdomen and pelvis was performed using the standard protocol during bolus administration of intravenous contrast. Multiplanar reconstructed images and MIPs were obtained and reviewed to evaluate the vascular anatomy.  Creatinine was obtained on site at [facility] at [address]  Results: Creatinine 1.7 mg/dL.  CONTRAST: 58mL ISOVUE-370 IOPAMIDOL (ISOVUE-370) INJECTION  76%  COMPARISON: CT scan of the chest 08/27/2018; CT scan of the abdomen and pelvis 10/19/2016  FINDINGS: CTA CHEST FINDINGS  Cardiovascular: Conventional 3 vessel arch anatomy. No significant atherosclerotic plaque in the thoracic aorta. Mild ectasia of the tubular portion of the ascending thoracic aorta with a maximal transverse diameter of 3.7 cm. The aortic valve appears thickened. The valve was much better evaluated on the prior gated cardiac CTA. The heart is normal in size. Mild concentric left ventricular hypertrophy. No pericardial effusion. Scattered calcifications along the coronary arteries.  Mediastinum/Nodes: Unremarkable CT appearance of the thyroid gland. No suspicious mediastinal or hilar adenopathy. No soft tissue mediastinal mass. The thoracic esophagus is unremarkable.  Lungs/Pleura: Lungs are clear. No pleural effusion or pneumothorax.  Musculoskeletal: No acute fracture or aggressive appearing lytic or blastic osseous lesion.  Review of the MIP images confirms the above findings.  CTA ABDOMEN AND PELVIS FINDINGS  VASCULAR  Aorta: Normal caliber aorta without aneurysm, dissection, vasculitis or significant stenosis.  Celiac: Patent without evidence of aneurysm, dissection, vasculitis or significant stenosis.  SMA: Patent without evidence of aneurysm, dissection, vasculitis or significant stenosis.  Renals: Both renal arteries are patent without evidence of aneurysm, dissection, vasculitis, fibromuscular dysplasia or significant stenosis.  IMA: Patent without evidence of aneurysm, dissection, vasculitis or significant stenosis.  Inflow: Patent without evidence of aneurysm, dissection, vasculitis or significant stenosis.  Veins: No focal venous abnormality.  Review of the MIP images confirms the above findings.  NON-VASCULAR  Hepatobiliary: Normal hepatic contour and morphology. No discrete hepatic lesions. Normal  appearance of the gallbladder. No intra or extrahepatic biliary ductal dilatation.  Pancreas: Unremarkable. No pancreatic ductal dilatation or surrounding inflammatory changes.  Spleen: Normal in size without focal abnormality.  Adrenals/Urinary Tract: The adrenal glands are normal. Numerous bilateral nonobstructing renal stones. There are at least 9 stones on the right, and 9 stones on the left. The largest individual stones measure approximately 5-6 mm. No enhancing renal mass. Circumscribed low-attenuation lesions in the left kidney are most consistent with simple cysts. The ureters and bladder are unremarkable.  Stomach/Bowel: Stomach is within normal limits. Appendix appears normal. No evidence of bowel wall thickening, distention, or inflammatory changes.  Lymphatic: No suspicious lymphadenopathy.  Reproductive: Prostate is unremarkable.  Other: No abdominal wall hernia or abnormality. No abdominopelvic ascites.  Musculoskeletal: No acute or significant osseous findings.  Review of the MIP images confirms the above findings.  IMPRESSION: 1. No evidence of aortic aneurysm, dissection or significant non coronary atherosclerotic plaque. 2. Normal caliber femoral and iliac arteries bilaterally. 3. Thickened aortic valve which was better evaluated on the cardiac gated CTA of the chest dated 08/27/2018. 4. Coronary artery calcifications. 5. No acute cardiopulmonary process. 6. No acute abnormality within the abdomen or pelvis. 7. Numerous bilateral nonobstructing renal stones.  Signed,  Criselda Peaches, MD, [facility]  [facility]   Electronically Signed By: Jacqulynn Cadet M.D. On: 11/18/2018 13:29  EXAM: CHEST -  2 VIEW  COMPARISON:  Chest x-ray 12/18/2018.  FINDINGS: Previously noted right-sided chest tube has been removed. No appreciable pneumothorax. Lung volumes are  normal. No consolidative airspace disease. No pleural effusions. No pneumothorax. No pulmonary nodule or mass noted. Pulmonary vasculature and the cardiomediastinal silhouette are within normal limits. Postoperative changes of right anterior many thoracotomy are again noted. Stented bioprosthesis projecting over the expected aortic valve position.  IMPRESSION: 1. Postoperative changes and support apparatus, as above. 2. No significant pneumothorax following removal of right-sided chest tube.   Electronically Signed   By: Vinnie Langton M.D.   On: 12/20/2018 08:29    Treatments: Surgery  CARDIOTHORACIC SURGERY OPERATIVE NOTE  Date of Procedure:                12/16/2018  Preoperative Diagnosis:        Quadricuspid Aortic Valve  Severe Aortic Insufficiency  Postoperative Diagnosis:    Same   Procedure:        Minimally Invasive Aortic Valve Replacement Right Anterior Mini-thoracotomy Edwards Inspiris Resilia Stented Bovine Pericardial Tissue Valve (size 35mm, model #11500A, serial #[id number]) Plating of right 3rd costal cartilage (KLS titanium plate)              Surgeon:        Valentina Gu. Roxy Manns, MD  Assistant:       Enid Cutter, PA-C  Anesthesia:    Albertha Ghee, MD  Operative Findings: ? Quadricuspid aortic valve ? Severe aortic insufficiency ? Low normal LV systolic function            BRIEF CLINICAL NOTE AND INDICATIONS FOR SURGERY  Patient is a 63 year old African-American male with history ofquadricuspid aortic valve andsevere aortic insufficiency, hypertension, left bundle branch block, asthma, obstructive sleep apnea, abnormal stress test and remote history of tobacco use whoreturns to the office today for follow-up discussion regardingtreatment options for management of severe aortic insufficiency.  Patient states that he has had some degree of exertional shortness of breath that has slowly progressed for years.  He denies any previous history of heart murmur or rheumatic fever. Over the past year symptoms of exertional shortness of breath progressed, and approximately 3 months ago he developed more severe episodes of exertional shortness of breath with occasional paroxysmal episodes of resting shortness of breath. Initially he was thought to be suffering from asthma. However, he began to experience lower extremity edema and orthopnea. He was referred for cardiology consultation and seen by Dr. Kandis Ban early February. An echocardiogram was performed that reportedly demonstrated the presence of severe aortic insufficiency with normal left ventricular size and systolic function. Paradoxically, a stress test was performed and reported to be high risk due to the presence of low ejection fraction. He was notably hypertensive at the time but reportedly not experiencing active symptoms of chest pain or shortness of breath. The patient was started on nifedipine and Lasix at that time.   The patient states that he took his medications for approximately 2 weeks but subsequently stopped because he could not afford to fill the prescriptions. Initially he did well but after he stopped taking his medications he began to experience worsening shortness of breath. Last week he began to experience lower extremity edema. He resumed taking Lasix 2 days ago. Early this morning the patient was awoken from sleep with burning substernal chest discomfort and shortness of breath. He was brought via EMS to the emergency department where baseline EKG revealed sinus rhythm with left bundle branch block. Chest  x-ray revealed mild pulmonary edema. BNP was elevated 300. Initial troponin was negative. Oxygen saturation was in the 80s. Symptoms improved with IV Lasix. He was admitted to the hospital for observation and follow-up troponin level was slightly increased to 0.36from <0.03 at baseline.Cardiothoracic surgical  consultation was requestedand I initially saw the patient in consultation on August 27, 2018. Cardiac gated CT angiogram was performed and confirmed the presence of a quadricuspid aortic valve and revealed what was felt to be mild nonobstructive coronary artery disease, although images were suboptimal and visualization of the right coronary artery was limited. Clinically the patient improved rapidly with intravenous diuresis and he was ultimately discharged home with plans for close medical follow-up and diagnostic cardiac catheterization at a delayed date because of the ongoing COVID-19 pandemic. The patient subsequently underwent left and right heart catheterization on Oct 14, 2018. He was found to have normal coronary arteries with no significant coronary artery disease. There was mild pulmonary hypertension.   The patient has been seen in consultation and counseled at length regarding the indications, risks and potential benefits of surgery.  All questions have been answered, and the patient provides full informed consent for the operation as described.    Discharge Exam: Blood pressure 111/64, pulse 76, temperature 98.5 F (36.9 C), temperature source Oral, resp. rate (!) 22, height 5\' 11"  (1.803 m), weight 115.2 kg, SpO2 97 %.   Physical Exam: General appearance:alert, cooperative and no distress Neurologic:intact Heart:regular rate and rhythm Lungs:On RA with acceptable O2 sats. Incisions intact and dry   Disposition:   Discharge Instructions    Amb Referral to Cardiac Rehabilitation   Complete by: As directed    Diagnosis: Valve Replacement   Valve: Aortic   After initial evaluation and assessments completed: Virtual Based Care may be provided alone or in conjunction with Phase 2 Cardiac Rehab based on patient barriers.: Yes     Allergies as of 12/20/2018   No Known Allergies     Medication List    STOP taking these medications   acetaminophen 500 MG  tablet Commonly known as: TYLENOL   amLODipine 10 MG tablet Commonly known as: NORVASC   HYDROcodone-acetaminophen 5-325 MG tablet Commonly known as: NORCO/VICODIN   nitroGLYCERIN 0.4 MG SL tablet Commonly known as: NITROSTAT     TAKE these medications   albuterol 108 (90 Base) MCG/ACT inhaler Commonly known as: VENTOLIN HFA Inhale 2 puffs into the lungs every 6 (six) hours as needed for wheezing or shortness of breath.   albuterol (2.5 MG/3ML) 0.083% nebulizer solution Commonly known as: PROVENTIL Take 3 mLs (2.5 mg total) by nebulization every 6 (six) hours as needed for wheezing or shortness of breath.   aspirin EC 81 MG tablet Take 81 mg by mouth daily.   budesonide-formoterol 160-4.5 MCG/ACT inhaler Commonly known as: SYMBICORT Inhale 2 puffs into the lungs 2 (two) times daily.   Combivent Respimat 20-100 MCG/ACT Aers respimat Generic drug: Ipratropium-Albuterol Inhale 2 puffs into the lungs daily as needed for shortness of breath.   dextromethorphan-guaiFENesin 30-600 MG 12hr tablet Commonly known as: MUCINEX DM Take 1 tablet by mouth 2 (two) times daily.   ferrous [id number] C-folic acid capsule Commonly known as: TRINSICON / FOLTRIN Take 1 capsule by mouth 3 (three) times daily after meals.   fluticasone 50 MCG/ACT nasal spray Commonly known as: FLONASE Place 2 sprays into both nostrils daily as needed for allergies or rhinitis.   furosemide 40 MG tablet Commonly known as: Lasix Take 1 tablet (  40 mg total) by mouth daily for 5 days. What changed:   medication strength  when to take this   gabapentin 600 MG tablet Commonly known as: NEURONTIN Take 600 mg by mouth 3 (three) times daily.   hydrOXYzine 25 MG capsule Commonly known as: VISTARIL Take 25 mg by mouth 3 (three) times daily.   metoprolol tartrate 25 MG tablet Commonly known as: LOPRESSOR Take 1 tablet (25 mg total) by mouth 2 (two) times daily.   montelukast 10 MG  tablet Commonly known as: SINGULAIR Take 10 mg by mouth every evening.   omeprazole 40 MG capsule Commonly known as: PRILOSEC TAKE ONE CAPSULE BY MOUTH DAILY   oxyCODONE-acetaminophen 5-325 MG tablet Commonly known as: Percocet Take 1 tablet by mouth every 4 (four) hours as needed for up to 7 days for severe pain.   rosuvastatin 10 MG tablet Commonly known as: CRESTOR Take 1 tablet (10 mg total) by mouth daily at 6 PM.      Follow-up Information    Rexene Alberts, MD. Go on 01/19/2019.   Specialty: Cardiothoracic Surgery Why: You have an appointment with Dr. Roxy Manns on Monday 01/19/19 at 2:30pm.  Please arrive 30 minutes early for a chest xray to be performed at [facility]  located on the first floor of the same building.  Contact information: [address] [place] [address] [phone]        Nigel Mormon, MD. Go on 01/05/2019.   Specialties: Cardiology, Radiology Why: You have a cardiology follow up appointment with Dr. Virgina Jock on Monday 01/05/19 at 9:30am.  Contact information: [place] [address] [phone]        [facility] Follow up.   Why: rolling walker Contact information: [place] [address] [phone]        [facility] [place] Follow up.   Specialty: Cardiothoracic Surgery Why: Office will call with appointment instructions for suture removal.  Contact information: [place], [place] [phone]         The patient has been discharged on:   1.Beta Blocker:  Yes [ x]                              No   [   ]                              If No, reason:  2.Ace Inhibitor/ARB: Yes [   ]                                     No  [ x  ]                                     If No, reason:BP not adequate for the addition of anb ACE-I  3.Statin:   Yes [ x  ]                   No  [   ]                  If No, reason:  4.Ecasa:  Yes  [ x  ]                  No   [   ]                  If No, reason:    Signed: Antony Odea, PA-C 12/20/2018, 10:07 AM

## 2018-12-17 NOTE — Discharge Instructions (Signed)

## 2018-12-17 NOTE — Progress Notes (Addendum)
TCTS DAILY ICU PROGRESS NOTE                   [address]            [place] [address]          [phone]   1 Day Post-Op Procedure(s) (LRB): MINIMALLY INVASIVE AORTIC VALVE REPLACEMENT (AVR) using Inspiris Aortic valve 61mm. (N/A) TRANSESOPHAGEAL ECHOCARDIOGRAM (TEE) (N/A) Rib Plating  Total Length of Stay:  LOS: 1 day   Subjective: Extubated las evening. Awake and alert, primary complaint is right hip and leg pain that is chronic.   Objective: Vital signs in last 24 hours: Temp:  [98.2 F (36.8 C)-99.5 F (37.5 C)] 98.2 F (36.8 C) (07/22 0600) Pulse Rate:  [78-81] 81 (07/22 0600) Cardiac Rhythm: Atrial paced (07/21 1500) Resp:  [10-24] 14 (07/22 0600) BP: (94-145)/(62-122) 106/63 (07/22 0500) SpO2:  [95 %-100 %] 100 % (07/22 0600) Arterial Line BP: (71-124)/(47-70) 108/55 (07/22 0600) Weight:  [117.4 kg] 117.4 kg (07/22 0600)  Filed Weights   12/16/18 0600 12/17/18 0600  Weight: 111.1 kg 117.4 kg    Weight change: 6.269 kg   Hemodynamic parameters for last 24 hours: PAP: (24-39)/(10-20) 32/10 CO:  [4.3 L/min-6.8 L/min] 6.8 L/min CI:  [1.9 L/min/m2-3 L/min/m2] 3 L/min/m2  Intake/Output from previous day: 07/21 0701 - 07/22 0700 In: 5892.5 [I.V.:4342.9; Blood:600; IV Piggyback:949.6] Out: 0347 [Urine:2545; Blood:650; Chest Tube:440]  Intake/Output this shift: Total I/O In: 185.9 [I.V.:85.9; IV Piggyback:100] Out: 165 [Urine:125; Chest Tube:40]  Current Meds: Scheduled Meds: . acetaminophen  1,000 mg Oral Q6H  . aspirin EC  325 mg Oral Daily  . bisacodyl  10 mg Oral Daily   Or  . bisacodyl  10 mg Rectal Daily  . Chlorhexidine Gluconate Cloth  6 each Topical Daily  . docusate sodium  200 mg Oral Daily  . [START ON 12/18/2018] enoxaparin (LOVENOX) injection  30 mg Subcutaneous QHS  . furosemide  20 mg Intravenous Q6H  . gabapentin  600 mg Oral TID  . insulin aspart  0-24 Units Subcutaneous Q4H  . mouth rinse  15 mL Mouth Rinse BID   . montelukast  10 mg Oral QPM  . [START ON 12/18/2018] pantoprazole  40 mg Oral Daily  . [START ON 12/18/2018] rosuvastatin  10 mg Oral q1800  . sodium chloride flush  10-40 mL Intracatheter Q12H   Continuous Infusions: . sodium chloride    . albumin human    . cefUROXime (ZINACEF)  IV Stopped (12/17/18 0645)  . dexmedetomidine (PRECEDEX) IV infusion Stopped (12/17/18 0451)  . lactated ringers    . lactated ringers 20 mL/hr at 12/17/18 0800   PRN Meds:.albumin human, metoprolol tartrate, morphine injection, ondansetron (ZOFRAN) IV, oxyCODONE, sodium chloride flush, traMADol  General appearance: alert, cooperative and mild distress Neurologic: intact Heart: Intrinsic sinus bradycardia in 40's. Currently A-paced at 70/min with appropriate capture. Lungs: Breath sounds clear, CT drainage moderate and tapering off.  Abdomen: soft and non-tender, absent BS.  Extremities: All well perfused, no pereipheral edema.  Wound: right chest incision covered with a dry dressing.  Lab Results: CBC: Recent Labs    12/16/18 2138 12/17/18 0430  WBC 10.9* 9.1  HGB 8.4* 7.8*  HCT 28.5* 25.7*  PLT 234 192   BMET:  Recent Labs    12/16/18 2134 12/16/18 2138 12/17/18 0430  NA 139  --  139  K 5.0  --  5.2*  CL 107  --  110  CO2  --   --  24  GLUCOSE 139*  --  143*  BUN 14  --  15  CREATININE 1.30* 1.42* 1.46*  CALCIUM  --   --  8.1*    CMET: Lab Results  Component Value Date   WBC 9.1 12/17/2018   HGB 7.8 (L) 12/17/2018   HCT 25.7 (L) 12/17/2018   PLT 192 12/17/2018   GLUCOSE 143 (H) 12/17/2018   CHOL 225 (H) 08/28/2018   TRIG 90 08/28/2018   HDL 78 08/28/2018   LDLCALC 129 (H) 08/28/2018   ALT 12 12/11/2018   AST 15 12/11/2018   NA 139 12/17/2018   K 5.2 (H) 12/17/2018   CL 110 12/17/2018   CREATININE 1.46 (H) 12/17/2018   BUN 15 12/17/2018   CO2 24 12/17/2018   INR 1.4 (H) 12/16/2018   HGBA1C 5.5 12/11/2018      PT/INR:  Recent Labs    12/16/18 1448  LABPROT  17.1*  INR 1.4*   Radiology: Dg Chest Port 1 View  Result Date: 12/17/2018 CLINICAL DATA:  Chest tube placement. EXAM: PORTABLE CHEST 1 VIEW COMPARISON:  12/16/2018. FINDINGS: Postsurgical changes noted the chest. Swan-Ganz catheter, right chest tube, pericardial drainage catheter in stable position prior cardiac valve replacement. Stable cardiomegaly. Improved atelectatic change, infiltrate right upper lung. Mild bibasilar subsegmental atelectasis. No pleural effusion or pneumothorax. Improved right chest wall subcutaneous emphysema. IMPRESSION: 1. Postsurgical changes chest. Lines and tubes in stable position. No pneumothorax. 2.  Prior cardiac valve replacement.  Stable cardiomegaly. 3. Improved right upper lobe atelectasis/infiltrate. Mild bibasilar atelectasis. 4.  Improved right chest wall subcutaneous emphysema. Electronically Signed   By: Marcello Moores  Register   On: 12/17/2018 08:40   Dg Chest Port 1 View  Result Date: 12/16/2018 CLINICAL DATA:  Status post aortic valve replacement EXAM: PORTABLE CHEST 1 VIEW COMPARISON:  12/11/2018 FINDINGS: Cardiac shadow is mildly prominent but accentuated by the portable technique. Aortic valve replacement is now seen. Swan-Ganz catheter is noted in the pulmonary outflow tract. Right thoracostomy catheter and pericardial drain are noted in satisfactory position. No definitive pneumothorax is seen. Some ovoid density is noted in the right upper lobe which may be related to incomplete aeration. IMPRESSION: Postoperative changes with tubes and lines as described above. Increased density in the right upper lobe likely related to some consolidation and incomplete re-expansion. No other focal abnormality is noted. Electronically Signed   By: Inez Catalina M.D.   On: 12/16/2018 15:25     Assessment/Plan: S/P Procedure(s) (LRB): MINIMALLY INVASIVE AORTIC VALVE REPLACEMENT (AVR) using Inspiris Aortic valve 2mm. (N/A) TRANSESOPHAGEAL ECHOCARDIOGRAM (TEE) (N/A) Rib  Plating  -POD1 aortic valve replacement with at 21 Edwards Inspiris bioprosthetic valve for severe aortic insufficiency. VS and hemodynamics stable since surgery. On no pressors or inotropes. Leave chest tubes but  D/C lines and mobilize with physical therapy.   -Chronic kidney disease and post-op volume excess- Wt ~ 7kg above pre-op.  UO adequate. Continue IV lasix 20mg  q6h. Monitor K+  -Expected acute blood loss anemia on chronic anemia-Hct stable with minimal continued losses. No indication for transfusion. Monitor. Add Trinsicon when he is tolerating advanced diet.  -Chronic hip pain-Neurontin resumed.   -History of asthma-suspicious that respiratory Sx's may have been a manifestation of his aortic insufficiency.  Singular resumed, holding inhalers for now to avoid arrhythmias from B-agonists.  -Endo- HgbA1C 5.5-Continue SSI for now.  -DVT PPX-SCD's for now. Add [facility] lovenox tomorrow. Antony Odea, PA-C [phone] 12/17/2018 8:47 AM   I have seen  and examined the patient and agree with the assessment and plan as outlined.  Doing well POD1.  Sinus brady under pacer w/ stable hemodynamics, no drips.  Breathing comfortably.  Feels hungry.  Chronic pain right hip is only complaint.  Mobilize.  D/C lines.  Continue AAI pacing for now.  Rexene Alberts, MD 12/17/2018 9:35 AM

## 2018-12-18 ENCOUNTER — Inpatient Hospital Stay (HOSPITAL_COMMUNITY): Payer: No Typology Code available for payment source

## 2018-12-18 LAB — BASIC METABOLIC PANEL
Anion gap: 9 (ref 5–15)
BUN: 24 mg/dL — ABNORMAL HIGH (ref 8–23)
CO2: 25 mmol/L (ref 22–32)
Calcium: 8.1 mg/dL — ABNORMAL LOW (ref 8.9–10.3)
Chloride: 102 mmol/L (ref 98–111)
Creatinine, Ser: 1.84 mg/dL — ABNORMAL HIGH (ref 0.61–1.24)
GFR calc Af Amer: 45 mL/min — ABNORMAL LOW (ref >60)
GFR calc non Af Amer: 39 mL/min — ABNORMAL LOW (ref >60)
Glucose, Bld: 127 mg/dL — ABNORMAL HIGH (ref 70–99)
Potassium: 4 mmol/L (ref 3.5–5.1)
Sodium: 136 mmol/L (ref 135–145)

## 2018-12-18 LAB — GLUCOSE, CAPILLARY
Glucose-Capillary: 108 mg/dL — ABNORMAL HIGH (ref 70–99)
Glucose-Capillary: 133 mg/dL — ABNORMAL HIGH (ref 70–99)

## 2018-12-18 LAB — CBC
HCT: 24.5 % — ABNORMAL LOW (ref 39.0–52.0)
Hemoglobin: 7.2 g/dL — ABNORMAL LOW (ref 13.0–17.0)
MCH: 23.8 pg — ABNORMAL LOW (ref 26.0–34.0)
MCHC: 29.4 g/dL — ABNORMAL LOW (ref 30.0–36.0)
MCV: 81.1 fL (ref 80.0–100.0)
Platelets: 194 10*3/uL (ref 150–400)
RBC: 3.02 MIL/uL — ABNORMAL LOW (ref 4.22–5.81)
RDW: 14.4 % (ref 11.5–15.5)
WBC: 8.2 10*3/uL (ref 4.0–10.5)
nRBC: 0 % (ref 0.0–0.2)

## 2018-12-18 MED ORDER — FUROSEMIDE 40 MG PO TABS
40.0000 mg | ORAL_TABLET | Freq: Every day | ORAL | Status: AC
  Administered 2018-12-19 – 2018-12-20 (×2): 40 mg via ORAL
  Filled 2018-12-18 (×2): qty 1

## 2018-12-18 MED ORDER — POTASSIUM CHLORIDE CRYS ER 20 MEQ PO TBCR
20.0000 meq | EXTENDED_RELEASE_TABLET | Freq: Every day | ORAL | Status: AC
  Administered 2018-12-18 – 2018-12-20 (×3): 20 meq via ORAL
  Filled 2018-12-18 (×3): qty 1

## 2018-12-18 MED ORDER — FUROSEMIDE 10 MG/ML IJ SOLN
40.0000 mg | Freq: Two times a day (BID) | INTRAMUSCULAR | Status: AC
  Administered 2018-12-18 (×2): 40 mg via INTRAVENOUS
  Filled 2018-12-18 (×2): qty 4

## 2018-12-18 MED ORDER — FE FUMARATE-B12-VIT C-FA-IFC PO CAPS
1.0000 | ORAL_CAPSULE | Freq: Three times a day (TID) | ORAL | Status: DC
  Administered 2018-12-18 – 2018-12-20 (×6): 1 via ORAL
  Filled 2018-12-18 (×6): qty 1

## 2018-12-18 MED ORDER — FUROSEMIDE 40 MG PO TABS: 40.0000 mg | ORAL_TABLET | Freq: Every day | ORAL | Status: DC

## 2018-12-18 MED ORDER — METOCLOPRAMIDE HCL 5 MG/ML IJ SOLN
10.0000 mg | Freq: Four times a day (QID) | INTRAMUSCULAR | Status: AC
  Administered 2018-12-18 – 2018-12-19 (×2): 10 mg via INTRAVENOUS
  Filled 2018-12-18 (×2): qty 2

## 2018-12-18 MED ORDER — ALUM & MAG HYDROXIDE-SIMETH 200-200-20 MG/5ML PO SUSP: 30.0000 mL | Freq: Four times a day (QID) | ORAL | Status: DC | PRN

## 2018-12-18 MED ORDER — MOVING RIGHT ALONG BOOK
Freq: Once | Status: AC
  Administered 2018-12-18: 06:00:00
  Filled 2018-12-18: qty 1

## 2018-12-18 MED FILL — Sodium Chloride IV Soln 0.9%: INTRAVENOUS | Qty: 2000 | Status: AC

## 2018-12-18 MED FILL — Heparin Sodium (Porcine) Inj 1000 Unit/ML: INTRAMUSCULAR | Qty: 10 | Status: AC

## 2018-12-18 MED FILL — Heparin Sodium (Porcine) Inj 1000 Unit/ML: INTRAMUSCULAR | Qty: 30 | Status: AC

## 2018-12-18 MED FILL — Lidocaine HCl Local Preservative Free (PF) Inj 2%: INTRAMUSCULAR | Qty: 15 | Status: AC

## 2018-12-18 MED FILL — Sodium Bicarbonate IV Soln 8.4%: INTRAVENOUS | Qty: 50 | Status: AC

## 2018-12-18 MED FILL — Mannitol IV Soln 20%: INTRAVENOUS | Qty: 500 | Status: AC

## 2018-12-18 MED FILL — Potassium Chloride Inj 2 mEq/ML: INTRAVENOUS | Qty: 40 | Status: AC

## 2018-12-18 MED FILL — Electrolyte-R (PH 7.4) Solution: INTRAVENOUS | Qty: 4000 | Status: AC

## 2018-12-18 NOTE — Progress Notes (Signed)
Spoke with Hershal Coria from [facility]. Their service was interested in knowing whether Mr. Berrios would need additional services after discharge for rehabilitation. I mentioned that I was not aware of any further needs at this time, but that the patient has orders to be transferred from the ICU, and additional needs may be discovered as the patient nears discharge.  The best number for [facility] is: [phone], should Mr. Gorden need services arranged for rehab after discharge.

## 2018-12-18 NOTE — Progress Notes (Addendum)
TCTS DAILY ICU PROGRESS NOTE                   [address]            [place], [address]          [phone]   2 Days Post-Op Procedure(s) (LRB): MINIMALLY INVASIVE AORTIC VALVE REPLACEMENT (AVR) using Inspiris Aortic valve 79mm. (N/A) TRANSESOPHAGEAL ECHOCARDIOGRAM (TEE) (N/A) Rib Plating  Total Length of Stay:  LOS: 2 days   Subjective: Awake and alert, pain controlled. Tolerating advanced diet. Passing gas, no BM yet.   Objective: Vital signs in last 24 hours: Temp:  [97.9 F (36.6 C)-98.8 F (37.1 C)] 98.3 F (36.8 C) (07/23 0419) Pulse Rate:  [66-83] 81 (07/23 0700) Cardiac Rhythm: Normal sinus rhythm;Atrial paced (07/23 0400) Resp:  [0-45] 28 (07/23 0700) BP: (95-145)/(67-86) 145/86 (07/23 0617) SpO2:  [87 %-100 %] 87 % (07/23 0700) Arterial Line BP: (105-134)/(54-68) 114/59 (07/22 1000) Weight:  [110.6 kg] 110.6 kg (07/23 0500)  Filed Weights   12/16/18 0600 12/17/18 0600 12/18/18 0500  Weight: 111.1 kg 117.4 kg 110.6 kg    Weight change: -6.8 kg   Hemodynamic parameters for last 24 hours: PAP: (27-35)/(6-13) 27/6  Intake/Output from previous day: 07/22 0701 - 07/23 0700 In: 1380.1 [P.O.:720; I.V.:460.1; IV Piggyback:200] Out: 2485 [Urine:1985; Chest Tube:500]  Intake/Output this shift: No intake/output data recorded.  Current Meds: Scheduled Meds: . acetaminophen  1,000 mg Oral Q6H  . aspirin EC  325 mg Oral Daily  . bisacodyl  10 mg Oral Daily   Or  . bisacodyl  10 mg Rectal Daily  . Chlorhexidine Gluconate Cloth  6 each Topical Daily  . docusate sodium  200 mg Oral Daily  . enoxaparin (LOVENOX) injection  30 mg Subcutaneous QHS  . ferrous [id number] C-folic acid  1 capsule Oral TID PC  . furosemide  40 mg Intravenous BID  . [START ON 12/19/2018] furosemide  40 mg Oral Daily  . gabapentin  600 mg Oral TID  . mouth rinse  15 mL Mouth Rinse BID  . montelukast  10 mg Oral QPM  . pantoprazole  40 mg Oral Daily  .  potassium chloride  20 mEq Oral Daily  . rosuvastatin  10 mg Oral q1800  . sodium chloride flush  10-40 mL Intracatheter Q12H   Continuous Infusions: . sodium chloride 250 mL (12/18/18 0635)  . lactated ringers     PRN Meds:.metoprolol tartrate, morphine injection, ondansetron (ZOFRAN) IV, oxyCODONE, sodium chloride flush, traMADol  General appearance: alert, cooperative and no distress Neurologic: intact Heart: regular rate and rhythm Lungs: Few coarse cracklse bilat. CT drainage 531ml/24 hours (231ml past 12 hrs). CXR stable with expected post-op changes.  Abdomen: soft, nontender. Extremities: Well perfused, paplable DP pulses bilat. Right groin vascular access sites dry, soft, no hematoma. Wound: Right chest incision is covered with a dry Aquacel dressing.  Lab Results: CBC: Recent Labs    12/17/18 1709 12/18/18 0406  WBC 9.0 8.2  HGB 7.5* 7.2*  HCT 25.2* 24.5*  PLT 205 194   BMET:  Recent Labs    12/17/18 1709 12/18/18 0406  NA 135 136  K 4.4 4.0  CL 104 102  CO2 23 25  GLUCOSE 154* 127*  BUN 24* 24*  CREATININE 1.99* 1.84*  CALCIUM 8.2* 8.1*    CMET: Lab Results  Component Value Date   WBC 8.2 12/18/2018   HGB 7.2 (L) 12/18/2018   HCT 24.5 (L)  12/18/2018   PLT 194 12/18/2018   GLUCOSE 127 (H) 12/18/2018   CHOL 225 (H) 08/28/2018   TRIG 90 08/28/2018   HDL 78 08/28/2018   LDLCALC 129 (H) 08/28/2018   ALT 12 12/11/2018   AST 15 12/11/2018   NA 136 12/18/2018   K 4.0 12/18/2018   CL 102 12/18/2018   CREATININE 1.84 (H) 12/18/2018   BUN 24 (H) 12/18/2018   CO2 25 12/18/2018   INR 1.4 (H) 12/16/2018   HGBA1C 5.5 12/11/2018      PT/INR:  Recent Labs    12/16/18 1448  LABPROT 17.1*  INR 1.4*   Radiology: No results found.   Assessment/Plan: S/P Procedure(s) (LRB): MINIMALLY INVASIVE AORTIC VALVE REPLACEMENT (AVR) using Inspiris Aortic valve 34mm. (N/A) TRANSESOPHAGEAL ECHOCARDIOGRAM (TEE) (N/A) Rib Plating  -POD2 aortic valve  replacement with at 62 Edwards Inspiris bioprosthetic valve for severe aortic insufficiency. VS and hemodynamics stable since surgery.  Leave chest tubes for drainage, cordis removed.  -Chronic kidney disease and post-op volume excess- Net 1L diuresis yesterday but Wt.down by 7kg (?)    UO adequate and creat trending back down.  IV lasix 40mg  this am. Monitor K+  -Expected acute blood loss anemia on chronic anemia-Hct stable with minimal continued losses. No indication for transfusion. Monitor. Add Trinsicon today  -Chronic hip pain-Neurontin resumed. PT to eval and tx.  -History of asthma-suspicious that respiratory Sx's may have been a manifestation of his aortic insufficiency.  Singular resumed, holding inhalers for now to avoid arrhythmias from B-agonists.  -Endo- HgbA1C 5.5- Glucose <150 past 24 hours. D/C SSI.  -DVT PPX-- Add [place] Lovenox today     Malon Kindle [phone] 12/18/2018 7:33 AM   I have seen and examined the patient and agree with the assessment and plan as outlined.  Now maintaining NSR w/ HR 70's and stable BP.  Stop pacing.  Continue diuresis.  D/C tubes later today or tomorrow, depending on output.  Mobilize.  Watch Hgb and start iron.  Transfer 4E.  Anticipate possible d/c home by Saturday  Rexene Alberts, MD 12/18/2018 8:27 AM

## 2018-12-18 NOTE — Plan of Care (Signed)
  Problem: Education: Goal: Knowledge of General Education information will improve Description Including pain rating scale, medication(s)/side effects and non-pharmacologic comfort measures Outcome: Progressing   Problem: Health Behavior/Discharge Planning: Goal: Ability to manage health-related needs will improve Outcome: Progressing   

## 2018-12-18 NOTE — Progress Notes (Signed)
Assisted tele visit to patient with family member.  Esli Clements R, RN  

## 2018-12-18 NOTE — Evaluation (Signed)
Physical Therapy Evaluation Patient Details Name: Charles Riggs. MRN: [id number] DOB: [birth year] Today's Date: 12/18/2018   History of Present Illness  63 year old African-American male with history of quadricuspid aortic valve and severe aortic insufficiency, hypertension, left bundle branch block, asthma, obstructive sleep apnea, abnormal stress test and herniated disc with pain referred to hips R>L. s/p Minimally Invasive Aortic Valve Replacement 12/16/2018.  Clinical Impression  PTA pt living with wife in multistory home with 3 steps to enter and bed and bath on first floor. Pt independent in mobility and ADLs working as Geologist, engineering. Pt currently mainly limited in mobility by R hip pain radiating down to foot with increased numbness and tingling with progression of gait, however longstanding hip pain and cardiopulmonary deficits have lead to decreased strength and endurance. Pt has had cortisone injection in R hip with little success.  Pt is currently mod I for bed mobility, supervision for transfers and min guard for ambulation of 420 ft with EVA walker. PT will continue to work with pt acutely however PT does not feel that additional PT services are needed at discharge. Pt to follow up with orthopedist about R hip pain and possible spinal source.     Follow Up Recommendations No PT follow up;Supervision - Intermittent    Equipment Recommendations  Rolling walker with 5" wheels       Precautions / Restrictions Precautions Precautions: Sternal Precaution Comments: R sided chest tube Restrictions Weight Bearing Restrictions: Yes RUE Weight Bearing: Partial weight bearing RUE Partial Weight Bearing Percentage or Pounds: STERNAL precautions LUE Weight Bearing: Partial weight bearing LUE Partial Weight Bearing Percentage or Pounds: STERNAL precautions      Mobility  Bed Mobility Overal bed mobility: Modified Independent             General bed mobility comments: HoB  elevated, vc for sternal precautions  Transfers Overall transfer level: Needs assistance Equipment used: None Transfers: Sit to/from Stand Sit to Stand: Supervision         General transfer comment: supervision for safety, able to powerup and steady before reaching to EVA walker  Ambulation/Gait Ambulation/Gait assistance: Min guard Gait Distance (Feet): 420 Feet Assistive device: (EVA Walker) Gait Pattern/deviations: Step-through pattern;Shuffle Gait velocity: slowed Gait velocity interpretation: 1.31 - 2.62 ft/sec, indicative of limited community ambulator General Gait Details: slow shuffling gait, with c/o of R hip pain with progression of R LE numbness and tingling with advancement of gait      Balance Overall balance assessment: Mild deficits observed, not formally tested                                           Pertinent Vitals/Pain Pain Assessment: 0-10 Pain Score: 10-Worst pain ever Pain Location: R hip  Pain Descriptors / Indicators: Burning;Radiating Pain Intervention(s): Limited activity within patient's tolerance;Monitored during session;Repositioned;Patient requesting pain meds-RN notified    Home Living Family/patient expects to be discharged to:: Private residence Living Arrangements: Spouse/significant other Available Help at Discharge: Family Type of Home: House Home Access: Stairs to enter Entrance Stairs-Rails: None Technical brewer of Steps: 3 Home Layout: Other (Comment)(bed on order will sleep in recliner ) Home Equipment: Shower seat - built in;Hand held shower head      Prior Function Level of Independence: Independent                  Extremity/Trunk Assessment  Upper Extremity Assessment Upper Extremity Assessment: Overall WFL for tasks assessed    Lower Extremity Assessment Lower Extremity Assessment: RLE deficits/detail;LLE deficits/detail RLE Deficits / Details: R hip pain radiating down to foot,  ROM WFL, strength grossly assessed during movement at 4/5 RLE Sensation: (numbness and tingling with progression of gait) LLE Deficits / Details: ROM WFL, strength grossly assessed durning movement at 4/5        Communication   Communication: No difficulties  Cognition Arousal/Alertness: Awake/alert Behavior During Therapy: WFL for tasks assessed/performed Overall Cognitive Status: Within Functional Limits for tasks assessed                                        General Comments General comments (skin integrity, edema, etc.): VSS, pt with tendency to tightly grasp walker and decreased signal for SaO2 on RA, when hand relaxed SaO2 returned to >90%O2, HR max with ambulation 111 bpm        Assessment/Plan    PT Assessment Patient needs continued PT services  PT Problem List Decreased strength;Decreased activity tolerance;Decreased mobility;Pain       PT Treatment Interventions DME instruction;Gait training;Stair training;Functional mobility training;Therapeutic activities;Therapeutic exercise;Balance training;Cognitive remediation;Patient/family education    PT Goals (Current goals can be found in the Care Plan section)  Acute Rehab PT Goals Patient Stated Goal: have less hip pain PT Goal Formulation: With patient Time For Goal Achievement: 01/01/19 Potential to Achieve Goals: Fair    Frequency Min 3X/week    AM-PAC PT "6 Clicks" Mobility  Outcome Measure Help needed turning from your back to your side while in a flat bed without using bedrails?: None Help needed moving from lying on your back to sitting on the side of a flat bed without using bedrails?: None Help needed moving to and from a bed to a chair (including a wheelchair)?: None Help needed standing up from a chair using your arms (e.g., wheelchair or bedside chair)?: None Help needed to walk in hospital room?: A Little Help needed climbing 3-5 steps with a railing? : A Little 6 Click Score:  22    End of Session   Activity Tolerance: Patient tolerated treatment well Patient left: in chair;with call bell/phone within reach;with nursing/sitter in room Nurse Communication: Mobility status;Patient requests pain meds PT Visit Diagnosis: Other abnormalities of gait and mobility (R26.89);Difficulty in walking, not elsewhere classified (R26.2);Muscle weakness (generalized) (M62.81);Pain Pain - Right/Left: Right Pain - part of body: Hip    Time: 2671-2458 PT Time Calculation (min) (ACUTE ONLY): 45 min   Charges:   PT Evaluation $PT Eval Moderate Complexity: 1 Mod PT Treatments $Gait Training: 23-37 mins        Terissa Haffey B. Migdalia Dk PT, DPT Acute Rehabilitation Services Pager [phone] Office [phone]   [place] 12/18/2018, 9:54 AM

## 2018-12-18 NOTE — TOC Initial Note (Signed)
Transition of Care (TOC) - Initial/Assessment Note    Patient Details  Name: Charles Riggs. MRN: [id number] Date of Birth: 1955-12-13  Transition of Care [facility]) CM/SW Contact:    Midge Minium RN, BSN, NCM-BC, ACM-RN [phone] Phone Number: 12/18/2018, 3:40 PM  Clinical Narrative:                 CM following for dispositional needs. 63 yo male s/p minimally invasive AVR 12/16/18. CM spoke to the patient to discuss the POC. The patient states he lives at home with his spouse and was independent PTA. PCP verified as: [facility] (verbal permission given to discuss his admission with [place] coordinators) PT eval complete with intermittent supervision/RW recommended. DME preference provided, with [employer] selected, with Learta Codding; [employer] liaison informed; AVS updated. CM paged the [facility] coordinator for @ [phone] (awaiting a response). Please fax the patients H/P, discharge summary to [phone]. CM team will continue to follow.   Expected Discharge Plan: Home/Self Care Barriers to Discharge: Continued Medical Work up   Patient Goals and CMS Choice Patient states their goals for this hospitalization and ongoing recovery are:: "to return home" CMS Medicare.gov Compare Post Acute Care list provided to:: Patient Choice offered to / list presented to : Patient  Expected Discharge Plan and Services Expected Discharge Plan: Home/Self Care In-house Referral: NA Discharge Planning Services: CM Consult Post Acute Care Choice: Durable Medical Equipment Living arrangements for the past 2 months: Single Family Home                 DME Arranged: Walker rolling DME Agency: [place] Date DME Agency Contacted: 12/18/18 Time DME Agency Contacted: [id number] Representative spoke with at DME Agency: Learta Codding HH Arranged: NA [place] Agency: NA        Prior Living Arrangements/Services Living arrangements for the past 2 months: [place] Lives with:: Self, Spouse    Do you feel safe going back to the place where you live?: Yes      Need for Family Participation in Patient Care: No (Comment) Care giver support system in place?: Yes (comment)   Criminal Activity/Legal Involvement Pertinent to Current Situation/Hospitalization: No - Comment as needed  Activities of Daily Living Home Assistive Devices/Equipment: CPAP, Nebulizer ADL Screening (condition at time of admission) Patient's cognitive ability adequate to safely complete daily activities?: Yes Is the patient deaf or have difficulty hearing?: Yes Does the patient have difficulty seeing, even when wearing glasses/contacts?: No Does the patient have difficulty concentrating, remembering, or making decisions?: No Patient able to express need for assistance with ADLs?: Yes Does the patient have difficulty dressing or bathing?: No Independently performs ADLs?: Yes (appropriate for developmental age) Does the patient have difficulty walking or climbing stairs?: Yes Weakness of Legs: Both Weakness of Arms/Hands: None  Permission Sought/Granted Permission sought to share information with : Case Manager, PCP, Investment banker, corporate granted to share info w AGENCY: [place]; [employer]        Emotional Assessment Appearance:: Appears older than stated age Attitude/Demeanor/Rapport: Gracious Affect (typically observed): Accepting, Pleasant Orientation: : Oriented to Situation, Oriented to  Time, Oriented to Place, Oriented to Self Alcohol / Substance Use: Not Applicable Psych Involvement: No (comment)  Admission diagnosis:  Aortic Insufficiency Patient Active Problem List   Diagnosis Date Noted  . S/P minimally invasive aortic valve replacement with bioprosthetic valve 12/16/2018  . Anemia in chronic kidney disease   . Chronic kidney disease (  CKD)   . Elevated troponin 08/29/2018  . Severe aortic insufficiency   . Acute on chronic diastolic heart failure ([place])    . Nonrheumatic aortic valve insufficiency   . Essential hypertension   . Obstructive sleep apnea   . Quadricuspid aortic valve   . Chest pain in adult 08/27/2018  . LEG PAIN 04/26/2010  . DEPRESSION 08/19/2009  . RENAL CALCULUS 01/05/2009  . DM 10/12/2008  . COCAINE ABUSE, HX OF 06/16/2007  . ASTHMA 03/24/2007  . GERD 03/24/2007   PCP:  [facility]   [facility] [address], [place] [address] Phone: [phone] Fax: [place], [place] [address] [place] [address] Phone: [phone] Fax: [phone]     Social Determinants of Health (SDOH) Interventions    Readmission Risk Interventions Readmission Risk Prevention Plan 12/18/2018  Transportation Screening Complete  PCP or Specialist Appt within 3-5 Days Complete  HRI or [place] Complete  Social Work Consult for [place] Planning/Counseling Complete  Palliative Care Screening Not Applicable  Medication Review (RN Care Manager) Referral to Pharmacy  Some recent data might be hidden

## 2018-12-18 NOTE — Progress Notes (Signed)
Pt received from [place]. Telebox 10 applied/ccmd notified. Chg bath given. Vitals stable. Pt chest tube connected to suction. Pt denies any complaints. Pt oriented to room and call bell within reach. Will continue to monitor,  Jerald Kief, RN

## 2018-12-19 LAB — BASIC METABOLIC PANEL
Anion gap: 9 (ref 5–15)
BUN: 18 mg/dL (ref 8–23)
CO2: 27 mmol/L (ref 22–32)
Calcium: 8.4 mg/dL — ABNORMAL LOW (ref 8.9–10.3)
Chloride: 102 mmol/L (ref 98–111)
Creatinine, Ser: 1.66 mg/dL — ABNORMAL HIGH (ref 0.61–1.24)
GFR calc Af Amer: 51 mL/min — ABNORMAL LOW (ref >60)
GFR calc non Af Amer: 44 mL/min — ABNORMAL LOW (ref >60)
Glucose, Bld: 110 mg/dL — ABNORMAL HIGH (ref 70–99)
Potassium: 4.3 mmol/L (ref 3.5–5.1)
Sodium: 138 mmol/L (ref 135–145)

## 2018-12-19 LAB — CBC
HCT: 26 % — ABNORMAL LOW (ref 39.0–52.0)
Hemoglobin: 7.7 g/dL — ABNORMAL LOW (ref 13.0–17.0)
MCH: 23.8 pg — ABNORMAL LOW (ref 26.0–34.0)
MCHC: 29.6 g/dL — ABNORMAL LOW (ref 30.0–36.0)
MCV: 80.5 fL (ref 80.0–100.0)
Platelets: 223 10*3/uL (ref 150–400)
RBC: 3.23 MIL/uL — ABNORMAL LOW (ref 4.22–5.81)
RDW: 14.2 % (ref 11.5–15.5)
WBC: 9 10*3/uL (ref 4.0–10.5)
nRBC: 0 % (ref 0.0–0.2)

## 2018-12-19 MED ORDER — IPRATROPIUM-ALBUTEROL 0.5-2.5 (3) MG/3ML IN SOLN
3.0000 mL | Freq: Every day | RESPIRATORY_TRACT | Status: DC
  Administered 2018-12-19 – 2018-12-20 (×2): 3 mL via RESPIRATORY_TRACT
  Filled 2018-12-19 (×2): qty 3

## 2018-12-19 MED ORDER — METOPROLOL TARTRATE 12.5 MG HALF TABLET
12.5000 mg | ORAL_TABLET | Freq: Two times a day (BID) | ORAL | Status: DC
  Administered 2018-12-19 – 2018-12-20 (×3): 12.5 mg via ORAL
  Filled 2018-12-19 (×3): qty 1

## 2018-12-19 MED ORDER — IPRATROPIUM-ALBUTEROL 20-100 MCG/ACT IN AERS: 1.0000 | INHALATION_SPRAY | Freq: Every day | RESPIRATORY_TRACT | Status: DC

## 2018-12-19 MED ORDER — NON FORMULARY: 160.0000 ug | Freq: Two times a day (BID) | Status: DC

## 2018-12-19 MED ORDER — MOMETASONE FURO-FORMOTEROL FUM 200-5 MCG/ACT IN AERO
2.0000 | INHALATION_SPRAY | Freq: Two times a day (BID) | RESPIRATORY_TRACT | Status: DC
  Administered 2018-12-20: 2 via RESPIRATORY_TRACT
  Filled 2018-12-19: qty 8.8

## 2018-12-19 NOTE — Progress Notes (Addendum)
3 Days Post-Op Procedure(s) (LRB): MINIMALLY INVASIVE AORTIC VALVE REPLACEMENT (AVR) using Inspiris Aortic valve 63mm. (N/A) TRANSESOPHAGEAL ECHOCARDIOGRAM (TEE) (N/A) Rib Plating Subjective: Up in the chair. He is pleased with his progress. Primary problem remains right hip and leg pain which is chronic.  Surgical pain is controlled.   Objective: Vital signs in last 24 hours: Temp:  [97.9 F (36.6 C)-98.5 F (36.9 C)] 98.5 F (36.9 C) (07/24 0442) Pulse Rate:  [69-84] 79 (07/24 0442) Cardiac Rhythm: Normal sinus rhythm (07/24 0230) Resp:  [18-33] 18 (07/24 0442) BP: (117-144)/(70-89) 126/72 (07/24 0442) SpO2:  [98 %-100 %] 99 % (07/24 0442)     Intake/Output from previous day: 07/23 0701 - 07/24 0700 In: 240 [P.O.:240] Out: 2460 [Urine:2025; Chest Tube:435] Intake/Output this shift: No intake/output data recorded.  Physical Exam: General appearance: alert, cooperative and no distress Neurologic: intact Heart: regular rate and rhythm Lungs: On RA with acceptable O2 sats. . CT drainage has tapered off.  Lab Results: Recent Labs    12/18/18 0406 12/19/18 0358  WBC 8.2 9.0  HGB 7.2* 7.7*  HCT 24.5* 26.0*  PLT 194 223   BMET:  Recent Labs    12/18/18 0406 12/19/18 0358  NA 136 138  K 4.0 4.3  CL 102 102  CO2 25 27  GLUCOSE 127* 110*  BUN 24* 18  CREATININE 1.84* 1.66*  CALCIUM 8.1* 8.4*    PT/INR:  Recent Labs    12/16/18 1448  LABPROT 17.1*  INR 1.4*   ABG    Component Value Date/Time   PHART 7.340 (L) 12/16/2018 1637   HCO3 24.9 12/16/2018 1637   TCO2 23 12/16/2018 2134   ACIDBASEDEF 1.0 12/16/2018 1637   O2SAT 95.0 12/16/2018 1637   CBG (last 3)  Recent Labs    12/17/18 2011 12/18/18 0021 12/18/18 0416  GLUCAP 145* 133* 108*    Assessment/Plan: S/P Procedure(s) (LRB): MINIMALLY INVASIVE AORTIC VALVE REPLACEMENT (AVR) using Inspiris Aortic valve 69mm. (N/A) TRANSESOPHAGEAL ECHOCARDIOGRAM (TEE) (N/A) Rib Plating  -POD3 aortic  valve replacement with at 28 [address] Inspiris bioprosthetic valve for severe aortic insufficiency. VS stable. Remove chest tubes and pacer wires today. Anticipate discharge in AM.  -Chronic kidney disease and post-op volume excess-Appears euvolemic on exam.   UO adequate and creat continues to trend toward baseline.  PO lasix 40mg  BID. Monitor K+  -Expected acute blood loss anemia on chronic anemia-Hct trending. Monitor. ContinueTrinsicon   -Chronic hip pain-Neurontin resumed. PT assisting. Plan for rolling walker at diacharge.  -History of asthma-suspicious that respiratory Sx's may have been a manifestation of his aortic insufficiency. He has had no significant wheezing since surgery.  Singular resumed, avoiding B-agonists.  -Endo- HgbA1C 5.5- Glucometers discontinued  -DVT PPX-- Continue Lovenox, ambulate as able.    LOS: 3 days    Antony Odea, PA-C [phone] 12/19/2018   I have seen and examined the patient and agree with the assessment and plan as outlined.  Doing very well.  Start low dose beta blocker.  Anticipate likely d/c home tomorrow.  Rexene Alberts, MD 12/19/2018 8:59 AM

## 2018-12-19 NOTE — Progress Notes (Signed)
Physical Therapy Treatment Patient Details Name: Charles Riggs. MRN: [id number] DOB: 07-Dec-1955 Today's Date: 12/19/2018    History of Present Illness 63 year old African-American male with history of quadricuspid aortic valve and severe aortic insufficiency, hypertension, left bundle branch block, asthma, obstructive sleep apnea, abnormal stress test and herniated disc with pain referred to hips R>L. s/p Minimally Invasive Aortic Valve Replacement 12/16/2018.    PT Comments    Pt performed gt training without device and min-guard assistance.  Pt is slow and guarded and limited due to R hip pain.  Educated on moving in the tube and patient able to maintain.  Refused stair training due to hip pain and noted DOE.  PTA educated on proper sequencing and not to pull on railing for support.      Follow Up Recommendations  No PT follow up;Supervision - Intermittent     Equipment Recommendations  Rolling walker with 5" wheels    Recommendations for Other Services       Precautions / Restrictions Precautions Precautions: Sternal Precaution Comments: Educated on moving in the tube to avoid pushing or pulling outside of the tube. Restrictions LUE Partial Weight Bearing Percentage or Pounds: STERNAL precautions    Mobility  Bed Mobility Overal bed mobility: Modified Independent             General bed mobility comments: HoB elevated, vc for sternal precautions  Transfers Overall transfer level: Needs assistance Equipment used: None Transfers: Sit to/from Stand Sit to Stand: Supervision         General transfer comment: Mildly unsteady when coming to standing but able to correct balance without physical assistance.  Ambulation/Gait Ambulation/Gait assistance: Min guard Gait Distance (Feet): 250 Feet Assistive device: None Gait Pattern/deviations: Step-through pattern Gait velocity: slowed   General Gait Details: Cues for reciprocal armswing and upper trunk control.   Pt slow and guarded with noted DOE.   Stairs             Wheelchair Mobility    Modified Rankin (Stroke Patients Only)       Balance Overall balance assessment: Mild deficits observed, not formally tested                                          Cognition Arousal/Alertness: Awake/alert Behavior During Therapy: WFL for tasks assessed/performed Overall Cognitive Status: Within Functional Limits for tasks assessed                                        Exercises      General Comments        Pertinent Vitals/Pain Pain Assessment: 0-10 Pain Score: 8  Pain Location: R hip  Pain Descriptors / Indicators: Burning;Radiating Pain Intervention(s): Monitored during session;Repositioned    Home Living                      Prior Function            PT Goals (current goals can now be found in the care plan section) Acute Rehab PT Goals Patient Stated Goal: have less hip pain Potential to Achieve Goals: Fair Progress towards PT goals: Progressing toward goals    Frequency    Min 3X/week      PT Plan Current plan remains appropriate  Co-evaluation              AM-PAC PT "6 Clicks" Mobility   Outcome Measure  Help needed turning from your back to your side while in a flat bed without using bedrails?: None Help needed moving from lying on your back to sitting on the side of a flat bed without using bedrails?: None Help needed moving to and from a bed to a chair (including a wheelchair)?: None Help needed standing up from a chair using your arms (e.g., wheelchair or bedside chair)?: None Help needed to walk in hospital room?: A Little Help needed climbing 3-5 steps with a railing? : A Little 6 Click Score: 22    End of Session Equipment Utilized During Treatment: Gait belt Activity Tolerance: Patient tolerated treatment well Patient left: in chair;with call bell/phone within reach;with nursing/sitter in  room Nurse Communication: Mobility status;Patient requests pain meds PT Visit Diagnosis: Other abnormalities of gait and mobility (R26.89);Difficulty in walking, not elsewhere classified (R26.2);Muscle weakness (generalized) (M62.81);Pain Pain - Right/Left: Right Pain - part of body: Hip     Time: 1728-1740 PT Time Calculation (min) (ACUTE ONLY): 12 min  Charges:  $Gait Training: 8-22 mins                     Charles Riggs, PTA Acute Rehabilitation Services Pager [phone] Office [phone]     Charles Riggs Charles Riggs 12/19/2018, 5:47 PM

## 2018-12-19 NOTE — Progress Notes (Signed)
EPWs removed per order.  Pt tolerated well, all tips intact, site with mild oozing, guaze applied.  Pt understands bedrest for one hr.  CCMD notified, will monitor closely.

## 2018-12-19 NOTE — Plan of Care (Signed)
  Problem: Education: Goal: Knowledge of General Education information will improve Description Including pain rating scale, medication(s)/side effects and non-pharmacologic comfort measures Outcome: Progressing   Problem: Health Behavior/Discharge Planning: Goal: Ability to manage health-related needs will improve Outcome: Progressing   

## 2018-12-19 NOTE — Progress Notes (Signed)
CARDIAC REHAB PHASE I   PRE:  Rate/Rhythm: 74 BBB  BP:  Sitting: 122/67      SaO2: 97 RA  MODE:  Ambulation: 470 ft   POST:  Rate/Rhythm: 90 BBB  BP:  Sitting: 141/68    SaO2: 100 RA   Pt ambulated 443ft in hallway standby assist with front wheel walker. Pt c/o some chest tightness during walked, encouraged to take standing rest break and use pursed lipped breathing. Pt states relief. Pt educated on importance of showering and monitoring incisions daily. Encouraged continued IS use and walks. Pt given heart healthy diet. Reviewed restrictions and exercise guidelines. Will refer to CRP II [place]. Pt agreeable to Virtual Cardiac Rehab.   Pt is interested in participating in Virtual Cardiac Rehab. Pt advised that Virtual Cardiac Rehab is provided at no cost to the patient.  Checklist:  1. Pt has smart device  ie smartphone and/or ipad for downloading an app  Yes 2. Reliable internet/wifi service    Yes 3. Understands how to use their smartphone and navigate within an app.  Yes  Reviewed with pt the scheduling process for virtual cardiac rehab.  Pt verbalized understanding.  [phone] Charles Falco, RN BSN 12/19/2018 3:10 PM

## 2018-12-19 NOTE — Progress Notes (Signed)
Ambulated patient in hall approx. 400 ft. Tolerated well

## 2018-12-20 ENCOUNTER — Inpatient Hospital Stay (HOSPITAL_COMMUNITY): Payer: No Typology Code available for payment source

## 2018-12-20 LAB — CBC
HCT: 22.5 % — ABNORMAL LOW (ref 39.0–52.0)
Hemoglobin: 6.9 g/dL — CL (ref 13.0–17.0)
MCH: 24.4 pg — ABNORMAL LOW (ref 26.0–34.0)
MCHC: 30.7 g/dL (ref 30.0–36.0)
MCV: 79.5 fL — ABNORMAL LOW (ref 80.0–100.0)
Platelets: 208 10*3/uL (ref 150–400)
RBC: 2.83 MIL/uL — ABNORMAL LOW (ref 4.22–5.81)
RDW: 14.3 % (ref 11.5–15.5)
WBC: 6 10*3/uL (ref 4.0–10.5)
nRBC: 0 % (ref 0.0–0.2)

## 2018-12-20 LAB — BASIC METABOLIC PANEL
Anion gap: 6 (ref 5–15)
BUN: 12 mg/dL (ref 8–23)
CO2: 28 mmol/L (ref 22–32)
Calcium: 8.3 mg/dL — ABNORMAL LOW (ref 8.9–10.3)
Chloride: 103 mmol/L (ref 98–111)
Creatinine, Ser: 1.29 mg/dL — ABNORMAL HIGH (ref 0.61–1.24)
GFR calc Af Amer: >60 mL/min (ref >60)
GFR calc non Af Amer: 59 mL/min — ABNORMAL LOW (ref >60)
Glucose, Bld: 144 mg/dL — ABNORMAL HIGH (ref 70–99)
Potassium: 3.9 mmol/L (ref 3.5–5.1)
Sodium: 137 mmol/L (ref 135–145)

## 2018-12-20 MED ORDER — FE FUMARATE-B12-VIT C-FA-IFC PO CAPS: 1.0000 | ORAL_CAPSULE | Freq: Three times a day (TID) | ORAL | 0 refills | Status: DC

## 2018-12-20 MED ORDER — FUROSEMIDE 40 MG PO TABS: 40.0000 mg | ORAL_TABLET | Freq: Every day | ORAL | 11 refills | Status: DC

## 2018-12-20 MED ORDER — METOPROLOL TARTRATE 25 MG PO TABS: 25.0000 mg | ORAL_TABLET | Freq: Two times a day (BID) | ORAL | 2 refills | Status: DC

## 2018-12-20 MED ORDER — OXYCODONE-ACETAMINOPHEN 5-325 MG PO TABS: 1.0000 | ORAL_TABLET | ORAL | 0 refills | Status: AC | PRN

## 2018-12-20 NOTE — Progress Notes (Signed)
Discharged to home with family office visits in place teaching done  

## 2018-12-20 NOTE — Progress Notes (Signed)
4 Days Post-Op Procedure(s) (LRB): MINIMALLY INVASIVE AORTIC VALVE REPLACEMENT (AVR) using Inspiris Aortic valve 45mm. (N/A) TRANSESOPHAGEAL ECHOCARDIOGRAM (TEE) (N/A) Rib Plating Subjective: Feels good and is anxious to return home.   Objective: Vital signs in last 24 hours: Temp:  [98.2 F (36.8 C)-98.5 F (36.9 C)] 98.5 F (36.9 C) (07/25 0356) Pulse Rate:  [76-85] 76 (07/25 0356) Cardiac Rhythm: Normal sinus rhythm (07/25 0810) Resp:  [19-26] 22 (07/25 0356) BP: (111-145)/(64-80) 111/64 (07/25 0356) SpO2:  [96 %-98 %] 97 % (07/25 0840) Weight:  [389.3 kg] 115.2 kg (07/25 0356)  Hemodynamic parameters for last 24 hours:    Intake/Output from previous day: 07/24 0701 - 07/25 0700 In: -  Out: 1820 [TDSKA:7681; Chest Tube:50] Intake/Output this shift: No intake/output data recorded.  Physical Exam: General appearance:alert, cooperative and no distress Neurologic:intact Heart:regular rate and rhythm Lungs:On RA with acceptable O2 sats.  Incisions intact and dry  Lab Results: Recent Labs    12/19/18 0358 12/20/18 0317  WBC 9.0 6.0  HGB 7.7* 6.9*  HCT 26.0* 22.5*  PLT 223 208   BMET:  Recent Labs    12/19/18 0358 12/20/18 0317  NA 138 137  K 4.3 3.9  CL 102 103  CO2 27 28  GLUCOSE 110* 144*  BUN 18 12  CREATININE 1.66* 1.29*  CALCIUM 8.4* 8.3*    PT/INR: No results for input(s): LABPROT, INR in the last 72 hours. ABG    Component Value Date/Time   PHART 7.340 (L) 12/16/2018 1637   HCO3 24.9 12/16/2018 1637   TCO2 23 12/16/2018 2134   ACIDBASEDEF 1.0 12/16/2018 1637   O2SAT 95.0 12/16/2018 1637   CBG (last 3)  Recent Labs    12/17/18 2011 12/18/18 0021 12/18/18 0416  GLUCAP 145* 133* 108*    Assessment/Plan: S/P Procedure(s) (LRB): MINIMALLY INVASIVE AORTIC VALVE REPLACEMENT (AVR) using Inspiris Aortic valve 81mm. (N/A) TRANSESOPHAGEAL ECHOCARDIOGRAM (TEE) (N/A) Rib Plating  -POD4aortic valve replacement with at 24 [address]  Inspiris bioprosthetic valve for severe aortic insufficiency. VS stable. Doing well, plan discharge today.   -Chronic kidney disease and post-op volume excess-Appears euvolemic on exam. UO adequateand creat continues to trend toward baseline.PO lasix 40mg QD for 5 days.   -Expected acute blood loss anemia on chronic anemia, Hct dropped further but he is tolerating this well.  -Chronic hip pain-Neurontin resumed.PT assisting. Plan for rolling walker at [place].  -History of asthma-Will resume medications at discharge.   -Endo- HgbA1C 5.5- Glucometers discontinued    LOS: 4 days    Antony Odea, PA-C [phone] 12/20/2018

## 2018-12-20 NOTE — Progress Notes (Signed)
Notified MD on call regarding patient's Hgb this am of 6.9. Patient's asymptomatic with no complaints. No orders received patient resting.

## 2018-12-20 NOTE — Care Management (Signed)
H&P/DC Summary faxed to [facility] as per CM [facility] note.  RW to be delivered by [place].

## 2018-12-20 NOTE — Progress Notes (Signed)
CARDIAC REHAB PHASE I   Reinforced d/c education. Pt educated on site care. Reviewed restrictions. Encouraged ambulation. Walker at bedside for home use. Pt referred to CRP II [place].   [id number] Rufina Falco, RN BSN 12/20/2018 9:36 AM

## 2018-12-22 LAB — BPAM RBC
Blood Product Expiration Date: 202008172359
Blood Product Expiration Date: 202008172359
Blood Product Expiration Date: 202008172359
Blood Product Expiration Date: 202008172359
ISSUE DATE / TIME: 202007152142
ISSUE DATE / TIME: 202007212007
ISSUE DATE / TIME: 202007230653
Unit Type and Rh: 5100
Unit Type and Rh: 5100
Unit Type and Rh: 5100
Unit Type and Rh: 5100

## 2018-12-22 LAB — TYPE AND SCREEN
ABO/RH(D): O POS
Antibody Screen: NEGATIVE
Unit division: 0
Unit division: 0
Unit division: 0
Unit division: 0

## 2018-12-23 ENCOUNTER — Other Ambulatory Visit: Payer: Self-pay | Admitting: *Deleted

## 2018-12-23 ENCOUNTER — Ambulatory Visit (INDEPENDENT_AMBULATORY_CARE_PROVIDER_SITE_OTHER): Payer: Self-pay | Admitting: *Deleted

## 2018-12-23 ENCOUNTER — Telehealth (HOSPITAL_COMMUNITY): Payer: Self-pay | Admitting: *Deleted

## 2018-12-23 ENCOUNTER — Other Ambulatory Visit: Payer: Self-pay

## 2018-12-23 ENCOUNTER — Ambulatory Visit
Admission: RE | Admit: 2018-12-23 | Discharge: 2018-12-23 | Disposition: A | Discharge status: Home / Self Care | Payer: No Typology Code available for payment source | Source: Ambulatory Visit | Attending: Thoracic Surgery (Cardiothoracic Vascular Surgery) | Admitting: Thoracic Surgery (Cardiothoracic Vascular Surgery)

## 2018-12-23 DIAGNOSIS — Z953 Presence of xenogenic heart valve: Secondary | ICD-10-CM

## 2018-12-23 DIAGNOSIS — Z4802 Encounter for removal of sutures: Secondary | ICD-10-CM

## 2018-12-23 DIAGNOSIS — I351 Nonrheumatic aortic (valve) insufficiency: Secondary | ICD-10-CM

## 2018-12-23 DIAGNOSIS — Q238 Other congenital malformations of aortic and mitral valves: Secondary | ICD-10-CM

## 2018-12-23 LAB — ECHO INTRAOPERATIVE TEE
Height: 71 in
Weight: 3920 oz

## 2018-12-23 NOTE — Telephone Encounter (Addendum)
Received referral for this pt to participate in cardiac rehab s/p AVR.  Contacted pt to inform him of our scheduling process and confirm that this pt will pursue [place] from community care at [facility]. Pt knows he will need to get this authorization from his primary MD at the [place].  Pt stated that he did not have an appt until December.  Advised that since he had heart surgery he should at least touch base with his primary provider.  At that time he can ask for authorization for CR here at [facility].  During this conversation, pt complains of feeling electrical "shocks" in his chest from one side to the other that radiates to the arms and his spine.  Pt made reference to sternal wire.  Explained that the sternal wire is on the inside and this holds the breast bone together as the healing process and ossification.  Pt stated that he has an appt today with Dr. Virgina Jock.  Advised pt that I do see an appt for him to be seen by the CVTS nurse for staple removal.  The staples are on the outside as he thought they were inside of him.  Pt notes that the shock sensation he feels is continuous.  Asked if this felt sharp and prickly like a pin.  Pt stated no this feels like when you wear a TENS unit. Pt is en route to his appt for xray and staple removal.  Asked pt to please relay his complaint to them so that he can be evaluated for this "shock" sensation he feels "all the time".  Cherre Huger, BSN Cardiac and Training and development officer

## 2018-12-23 NOTE — Anesthesia Postprocedure Evaluation (Signed)
Anesthesia Post Note  Patient: Damarion Mendizabal.  Procedure(s) Performed: MINIMALLY INVASIVE AORTIC VALVE REPLACEMENT (AVR) using Inspiris Aortic valve 32mm. (N/A Chest) TRANSESOPHAGEAL ECHOCARDIOGRAM (TEE) (N/A ) Rib Plating     Patient location during evaluation: SICU Anesthesia Type: General Level of consciousness: sedated Pain management: pain level controlled Vital Signs Assessment: post-procedure vital signs reviewed and stable Respiratory status: patient remains intubated per anesthesia plan Cardiovascular status: stable Postop Assessment: no apparent nausea or vomiting Anesthetic complications: no    Last Vitals:  Vitals:   12/20/18 0356 12/20/18 0840  BP: 111/64   Pulse: 76   Resp: (!) 22   Temp: 36.9 C   SpO2: 96% 97%    Last Pain:  Vitals:   12/20/18 0800  TempSrc:   PainSc: 2                  Kailie Polus S

## 2018-12-24 NOTE — Progress Notes (Signed)
Mr. Charles Riggs returns s/p MINI AVR and PLATING of 3RD COSTAL CARTILAGE on 12/15/18.  On exam, the operative sites are healing well.  I removed two sutures from previous chest tube sites. There was some gaping post removal.  Benzoin and steri strips applied.  His main complaint is feeling as if he is being "shocked" at intervals.  He has called his cardiologist and was told he had wires inside his chest that needed to come out. I explained that the surgery Dr. Roxy Manns did did not require any wires to be removed.  All surgical materials are to remain.  In my opinion, the "shocks" are due to nerve pain related to his surgery.  This is seen often in this thoracic area.  I explained this to him and hopefully this is the case.  He is on Neurontin for his chronic hip pain.  I told him to continue this medication because we often prescribe it for post operative nerve pain and he agreed.  He will f/u as scheduled.

## 2018-12-27 DIAGNOSIS — R55 Syncope and collapse: Secondary | ICD-10-CM

## 2018-12-27 HISTORY — DX: Syncope and collapse: R55

## 2018-12-28 ENCOUNTER — Inpatient Hospital Stay (HOSPITAL_COMMUNITY)
Admission: EM | Admit: 2018-12-28 | Discharge: 2018-12-31 | DRG: 187 | Disposition: A | Discharge status: Home / Self Care | Payer: No Typology Code available for payment source | Attending: Thoracic Surgery (Cardiothoracic Vascular Surgery) | Admitting: Thoracic Surgery (Cardiothoracic Vascular Surgery)

## 2018-12-28 ENCOUNTER — Other Ambulatory Visit: Payer: Self-pay

## 2018-12-28 ENCOUNTER — Inpatient Hospital Stay (HOSPITAL_COMMUNITY): Payer: No Typology Code available for payment source

## 2018-12-28 ENCOUNTER — Emergency Department (HOSPITAL_COMMUNITY): Payer: No Typology Code available for payment source

## 2018-12-28 DIAGNOSIS — N189 Chronic kidney disease, unspecified: Secondary | ICD-10-CM | POA: Diagnosis present

## 2018-12-28 DIAGNOSIS — E669 Obesity, unspecified: Secondary | ICD-10-CM | POA: Diagnosis present

## 2018-12-28 DIAGNOSIS — Z87891 Personal history of nicotine dependence: Secondary | ICD-10-CM

## 2018-12-28 DIAGNOSIS — I10 Essential (primary) hypertension: Secondary | ICD-10-CM | POA: Diagnosis present

## 2018-12-28 DIAGNOSIS — R0602 Shortness of breath: Secondary | ICD-10-CM

## 2018-12-28 DIAGNOSIS — Z7951 Long term (current) use of inhaled steroids: Secondary | ICD-10-CM | POA: Diagnosis not present

## 2018-12-28 DIAGNOSIS — K219 Gastro-esophageal reflux disease without esophagitis: Secondary | ICD-10-CM | POA: Diagnosis present

## 2018-12-28 DIAGNOSIS — Z952 Presence of prosthetic heart valve: Secondary | ICD-10-CM

## 2018-12-28 DIAGNOSIS — Z20828 Contact with and (suspected) exposure to other viral communicable diseases: Secondary | ICD-10-CM | POA: Diagnosis present

## 2018-12-28 DIAGNOSIS — R55 Syncope and collapse: Secondary | ICD-10-CM | POA: Diagnosis not present

## 2018-12-28 DIAGNOSIS — I5033 Acute on chronic diastolic (congestive) heart failure: Secondary | ICD-10-CM | POA: Diagnosis present

## 2018-12-28 DIAGNOSIS — I313 Pericardial effusion (noninflammatory): Secondary | ICD-10-CM | POA: Diagnosis present

## 2018-12-28 DIAGNOSIS — Z7982 Long term (current) use of aspirin: Secondary | ICD-10-CM

## 2018-12-28 DIAGNOSIS — J90 Pleural effusion, not elsewhere classified: Principal | ICD-10-CM | POA: Diagnosis present

## 2018-12-28 DIAGNOSIS — Z6833 Body mass index (BMI) 33.0-33.9, adult: Secondary | ICD-10-CM

## 2018-12-28 DIAGNOSIS — I130 Hypertensive heart and chronic kidney disease with heart failure and stage 1 through stage 4 chronic kidney disease, or unspecified chronic kidney disease: Secondary | ICD-10-CM | POA: Diagnosis present

## 2018-12-28 DIAGNOSIS — I5032 Chronic diastolic (congestive) heart failure: Secondary | ICD-10-CM | POA: Diagnosis present

## 2018-12-28 DIAGNOSIS — G4733 Obstructive sleep apnea (adult) (pediatric): Secondary | ICD-10-CM | POA: Diagnosis present

## 2018-12-28 DIAGNOSIS — Z79891 Long term (current) use of opiate analgesic: Secondary | ICD-10-CM

## 2018-12-28 DIAGNOSIS — I359 Nonrheumatic aortic valve disorder, unspecified: Secondary | ICD-10-CM | POA: Diagnosis not present

## 2018-12-28 DIAGNOSIS — Z992 Dependence on renal dialysis: Secondary | ICD-10-CM | POA: Diagnosis not present

## 2018-12-28 DIAGNOSIS — N183 Chronic kidney disease, stage 3 (moderate): Secondary | ICD-10-CM | POA: Diagnosis present

## 2018-12-28 DIAGNOSIS — Z79899 Other long term (current) drug therapy: Secondary | ICD-10-CM | POA: Diagnosis not present

## 2018-12-28 DIAGNOSIS — D62 Acute posthemorrhagic anemia: Secondary | ICD-10-CM | POA: Diagnosis not present

## 2018-12-28 DIAGNOSIS — R0600 Dyspnea, unspecified: Secondary | ICD-10-CM

## 2018-12-28 DIAGNOSIS — R000 Tachycardia, unspecified: Secondary | ICD-10-CM | POA: Diagnosis not present

## 2018-12-28 DIAGNOSIS — D649 Anemia, unspecified: Secondary | ICD-10-CM

## 2018-12-28 DIAGNOSIS — D631 Anemia in chronic kidney disease: Secondary | ICD-10-CM | POA: Diagnosis present

## 2018-12-28 LAB — BRAIN NATRIURETIC PEPTIDE: B Natriuretic Peptide: 161.7 pg/mL — ABNORMAL HIGH (ref 0.0–100.0)

## 2018-12-28 LAB — SARS CORONAVIRUS 2 (TAT 6-24 HRS): SARS Coronavirus 2: NEGATIVE

## 2018-12-28 LAB — CBC WITH DIFFERENTIAL/PLATELET
Abs Immature Granulocytes: 0.07 10*3/uL (ref 0.00–0.07)
Basophils Absolute: 0 10*3/uL (ref 0.0–0.1)
Basophils Relative: 0 %
Eosinophils Absolute: 0.2 10*3/uL (ref 0.0–0.5)
Eosinophils Relative: 1 %
HCT: 22.7 % — ABNORMAL LOW (ref 39.0–52.0)
Hemoglobin: 6.6 g/dL — CL (ref 13.0–17.0)
Immature Granulocytes: 1 %
Lymphocytes Relative: 10 %
Lymphs Abs: 1.2 10*3/uL (ref 0.7–4.0)
MCH: 23.2 pg — ABNORMAL LOW (ref 26.0–34.0)
MCHC: 29.1 g/dL — ABNORMAL LOW (ref 30.0–36.0)
MCV: 79.9 fL — ABNORMAL LOW (ref 80.0–100.0)
Monocytes Absolute: 0.7 10*3/uL (ref 0.1–1.0)
Monocytes Relative: 6 %
Neutro Abs: 9.7 10*3/uL — ABNORMAL HIGH (ref 1.7–7.7)
Neutrophils Relative %: 82 %
Platelets: 406 10*3/uL — ABNORMAL HIGH (ref 150–400)
RBC: 2.84 MIL/uL — ABNORMAL LOW (ref 4.22–5.81)
RDW: 15.1 % (ref 11.5–15.5)
WBC: 11.9 10*3/uL — ABNORMAL HIGH (ref 4.0–10.5)
nRBC: 0 % (ref 0.0–0.2)

## 2018-12-28 LAB — BASIC METABOLIC PANEL
Anion gap: 9 (ref 5–15)
BUN: 19 mg/dL (ref 8–23)
CO2: 23 mmol/L (ref 22–32)
Calcium: 8.6 mg/dL — ABNORMAL LOW (ref 8.9–10.3)
Chloride: 105 mmol/L (ref 98–111)
Creatinine, Ser: 1.77 mg/dL — ABNORMAL HIGH (ref 0.61–1.24)
GFR calc Af Amer: 47 mL/min — ABNORMAL LOW (ref >60)
GFR calc non Af Amer: 41 mL/min — ABNORMAL LOW (ref >60)
Glucose, Bld: 125 mg/dL — ABNORMAL HIGH (ref 70–99)
Potassium: 4.7 mmol/L (ref 3.5–5.1)
Sodium: 137 mmol/L (ref 135–145)

## 2018-12-28 LAB — TROPONIN I (HIGH SENSITIVITY)
Troponin I (High Sensitivity): 81 ng/L — ABNORMAL HIGH (ref <18)
Troponin I (High Sensitivity): 78 ng/L — ABNORMAL HIGH (ref <18)

## 2018-12-28 LAB — APTT: aPTT: 45 seconds — ABNORMAL HIGH (ref 24–36)

## 2018-12-28 LAB — PREPARE RBC (CROSSMATCH)

## 2018-12-28 MED ORDER — ALBUTEROL SULFATE HFA 108 (90 BASE) MCG/ACT IN AERS
2.0000 | INHALATION_SPRAY | Freq: Once | RESPIRATORY_TRACT | Status: AC
  Administered 2018-12-28: 16:00:00 2 via RESPIRATORY_TRACT
  Filled 2018-12-28: qty 6.7

## 2018-12-28 MED ORDER — ALBUTEROL SULFATE HFA 108 (90 BASE) MCG/ACT IN AERS
2.0000 | INHALATION_SPRAY | Freq: Four times a day (QID) | RESPIRATORY_TRACT | Status: DC | PRN
  Administered 2018-12-28: 2 via RESPIRATORY_TRACT

## 2018-12-28 MED ORDER — METOPROLOL TARTRATE 25 MG PO TABS
25.0000 mg | ORAL_TABLET | Freq: Two times a day (BID) | ORAL | Status: DC
  Administered 2018-12-28 – 2018-12-31 (×6): 25 mg via ORAL
  Filled 2018-12-28 (×6): qty 1

## 2018-12-28 MED ORDER — FE FUMARATE-B12-VIT C-FA-IFC PO CAPS
1.0000 | ORAL_CAPSULE | Freq: Three times a day (TID) | ORAL | Status: DC
  Administered 2018-12-29 – 2018-12-31 (×7): 1 via ORAL
  Filled 2018-12-28 (×9): qty 1

## 2018-12-28 MED ORDER — SODIUM CHLORIDE 0.9 % IV SOLN: 250.0000 mL | INTRAVENOUS | Status: DC | PRN

## 2018-12-28 MED ORDER — IOHEXOL 350 MG/ML SOLN
75.0000 mL | Freq: Once | INTRAVENOUS | Status: AC | PRN
  Administered 2018-12-28: 22:00:00 75 mL via INTRAVENOUS

## 2018-12-28 MED ORDER — FUROSEMIDE 10 MG/ML IJ SOLN: 40.0000 mg | Freq: Once | INTRAMUSCULAR | Status: DC

## 2018-12-28 MED ORDER — GABAPENTIN 600 MG PO TABS
600.0000 mg | ORAL_TABLET | Freq: Three times a day (TID) | ORAL | Status: DC
  Administered 2018-12-28 – 2018-12-31 (×8): 600 mg via ORAL
  Filled 2018-12-28 (×8): qty 1

## 2018-12-28 MED ORDER — ASPIRIN 81 MG PO CHEW
243.0000 mg | CHEWABLE_TABLET | Freq: Once | ORAL | Status: AC
  Administered 2018-12-28: 243 mg via ORAL
  Filled 2018-12-28: qty 3

## 2018-12-28 MED ORDER — ALBUTEROL SULFATE (2.5 MG/3ML) 0.083% IN NEBU
2.5000 mg | INHALATION_SOLUTION | Freq: Four times a day (QID) | RESPIRATORY_TRACT | Status: DC | PRN
  Administered 2018-12-29 – 2018-12-31 (×4): 2.5 mg via RESPIRATORY_TRACT
  Filled 2018-12-28 (×4): qty 3

## 2018-12-28 MED ORDER — FUROSEMIDE 10 MG/ML IJ SOLN
40.0000 mg | Freq: Once | INTRAMUSCULAR | Status: AC
  Administered 2018-12-28: 22:00:00 40 mg via INTRAVENOUS
  Filled 2018-12-28: qty 4

## 2018-12-28 MED ORDER — ONDANSETRON HCL 4 MG/2ML IJ SOLN: 4.0000 mg | Freq: Four times a day (QID) | INTRAMUSCULAR | Status: DC | PRN

## 2018-12-28 MED ORDER — SODIUM CHLORIDE 0.9% FLUSH: 3.0000 mL | INTRAVENOUS | Status: DC | PRN

## 2018-12-28 MED ORDER — ROSUVASTATIN CALCIUM 5 MG PO TABS
10.0000 mg | ORAL_TABLET | Freq: Every day | ORAL | Status: DC
  Administered 2018-12-29 – 2018-12-30 (×2): 10 mg via ORAL
  Filled 2018-12-28 (×2): qty 2

## 2018-12-28 MED ORDER — ASPIRIN EC 81 MG PO TBEC
81.0000 mg | DELAYED_RELEASE_TABLET | Freq: Every day | ORAL | Status: DC
  Administered 2018-12-29 – 2018-12-31 (×3): 81 mg via ORAL
  Filled 2018-12-28 (×3): qty 1

## 2018-12-28 MED ORDER — AEROCHAMBER PLUS FLO-VU LARGE MISC: 1.0000 | Freq: Once | Status: DC

## 2018-12-28 MED ORDER — SODIUM CHLORIDE 0.9% FLUSH
3.0000 mL | Freq: Two times a day (BID) | INTRAVENOUS | Status: DC
  Administered 2018-12-28 – 2018-12-31 (×6): 3 mL via INTRAVENOUS

## 2018-12-28 MED ORDER — SODIUM CHLORIDE 0.9 % IV SOLN: 10.0000 mL/h | Freq: Once | INTRAVENOUS | Status: DC

## 2018-12-28 MED ORDER — ACETAMINOPHEN 325 MG PO TABS
650.0000 mg | ORAL_TABLET | ORAL | Status: DC | PRN
  Administered 2018-12-28 – 2018-12-30 (×3): 650 mg via ORAL
  Filled 2018-12-28 (×3): qty 2

## 2018-12-28 MED ORDER — HYDROCODONE-ACETAMINOPHEN 5-325 MG PO TABS
1.0000 | ORAL_TABLET | Freq: Once | ORAL | Status: AC
  Administered 2018-12-28: 1 via ORAL
  Filled 2018-12-28: qty 1

## 2018-12-28 MED ORDER — MONTELUKAST SODIUM 10 MG PO TABS
10.0000 mg | ORAL_TABLET | Freq: Every evening | ORAL | Status: DC
  Administered 2018-12-29 – 2018-12-30 (×3): 10 mg via ORAL
  Filled 2018-12-28 (×3): qty 1

## 2018-12-28 NOTE — ED Notes (Signed)
ED TO INPATIENT HANDOFF REPORT  ED Nurse Name and Phone #: Mauri [place] Name/Age/Gender Charles Riggs. 63 y.o. male Room/Bed: 021C/021C  Code Status   Code Status: Prior  Home/SNF/Other Dc home AO x 4 I  Triage Complete: Triage complete  Chief Complaint Syncope  Triage Note EMS reported pt was sitting out side and felt to hot and went in the house. Pt had a syncope pt sitting in chair when he passed out. . Pt also reports SHOB . Wife in room and reports pt has not had fever or cough or sore throat. t   Allergies No Known Allergies  Level of Care/Admitting Diagnosis ED Disposition    ED Disposition Condition [facility] Area: [place] [[id number]]  Level of Care: Progressive [102]  Covid Evaluation: Asymptomatic Screening Protocol (No Symptoms)  Diagnosis: Syncope [161096]  Admitting Physician: Rexene Alberts [place]  Attending Physician: Rexene Alberts [[id number]]  Estimated length of stay: past midnight tomorrow  Certification:: I certify this patient will need inpatient services for at least 2 midnights  PT Class (Do Not Modify): Inpatient [101]  PT Acc Code (Do Not Modify): Private [1]       B Medical/Surgery History Past Medical History:  Diagnosis Date  . Acute on chronic diastolic heart failure ([place])   . Anemia in chronic kidney disease   . Anxiety   . Aortic insufficiency   . Arthritis   . CHF (congestive heart failure) ([place])   . CKD (chronic kidney disease)   . Essential hypertension   . Nerve pain   . Non-rheumatic aortic regurgitation   . Obstructive sleep apnea    cpap  . Quadricuspid aortic valve   . S/P minimally invasive aortic valve replacement with bioprosthetic valve 12/16/2018   25 mm Edwards Inspiris Resilia stented bovine pericardial tissue valve via right mini thoracotomy approach  . Severe aortic insufficiency   . SOB (shortness of breath)    Past Surgical History:  Procedure Laterality Date  .  AORTIC VALVE REPLACEMENT N/A 12/16/2018   Procedure: MINIMALLY INVASIVE AORTIC VALVE REPLACEMENT (AVR) using Inspiris Aortic valve 80mm.;  Surgeon: Rexene Alberts, MD;  Location: [facility];  Service: Open Heart Surgery;  Laterality: N/A;  . HERNIA REPAIR    . RIB PLATING  12/16/2018   Procedure: Rib Plating;  Surgeon: Rexene Alberts, MD;  Location: [facility];  Service: Open Heart Surgery;;  . RIGHT/LEFT HEART CATH AND CORONARY ANGIOGRAPHY N/A 10/14/2018   Procedure: RIGHT/LEFT HEART CATH AND CORONARY ANGIOGRAPHY;  Surgeon: Nigel Mormon, MD;  Location: [place] CV LAB;  Service: Cardiovascular;  Laterality: N/A;  . TEE WITHOUT CARDIOVERSION N/A 11/11/2018   Procedure: TRANSESOPHAGEAL ECHOCARDIOGRAM (TEE);  Surgeon: Nigel Mormon, MD;  Location: [facility] ENDOSCOPY;  Service: Cardiovascular;  Laterality: N/A;  . TEE WITHOUT CARDIOVERSION N/A 12/16/2018   Procedure: TRANSESOPHAGEAL ECHOCARDIOGRAM (TEE);  Surgeon: Rexene Alberts, MD;  Location: [place];  Service: Open Heart Surgery;  Laterality: N/A;     A IV Location/Drains/Wounds Patient Lines/Drains/Airways Status   Active Line/Drains/Airways    Name:   Placement date:   Placement time:   Site:   Days:   Peripheral IV 12/28/18 Left   12/28/18    1600    -   less than 1   Peripheral IV 12/28/18 Right Antecubital   12/28/18    -    Antecubital   less than 1   Incision (Closed) 12/16/18  Chest Right   12/16/18    0918     12          Intake/Output Last 24 hours  Intake/Output Summary (Last 24 hours) at 12/28/2018 1932 Last data filed at 12/28/2018 1852 Gross per 24 hour  Intake 315 ml  Output -  Net 315 ml    Labs/Imaging Results for orders placed or performed during the hospital encounter of 12/28/18 (from the past 48 hour(s))  Basic metabolic panel     Status: Abnormal   Collection Time: 12/28/18  4:18 PM  Result Value Ref Range   Sodium 137 135 - 145 mmol/L   Potassium 4.7 3.5 - 5.1 mmol/L   Chloride 105 98 - 111 mmol/L   CO2  23 22 - 32 mmol/L   Glucose, Bld 125 (H) 70 - 99 mg/dL   BUN 19 8 - 23 mg/dL   Creatinine, Ser 1.77 (H) 0.61 - 1.24 mg/dL   Calcium 8.6 (L) 8.9 - 10.3 mg/dL   GFR calc non Af Amer 41 (L) >60 mL/min   GFR calc Af Amer 47 (L) >60 mL/min   Anion gap 9 5 - 15    Comment: Performed at [facility], [address], [place], [place] [address]  CBC with Differential     Status: Abnormal   Collection Time: 12/28/18  4:18 PM  Result Value Ref Range   WBC 11.9 (H) 4.0 - 10.5 K/uL   RBC 2.84 (L) 4.22 - 5.81 MIL/uL   Hemoglobin 6.6 (LL) 13.0 - 17.0 g/dL    Comment: REPEATED TO VERIFY THIS CRITICAL RESULT HAS VERIFIED AND BEEN CALLED TO RN L VENEGAS BY LESLIE BENFIELD ON 08 02 2020 AT [phone], AND HAS BEEN READ BACK.     HCT 22.7 (L) 39.0 - 52.0 %   MCV 79.9 (L) 80.0 - 100.0 fL   MCH 23.2 (L) 26.0 - 34.0 pg   MCHC 29.1 (L) 30.0 - 36.0 g/dL   RDW 15.1 11.5 - 15.5 %   Platelets 406 (H) 150 - 400 K/uL   nRBC 0.0 0.0 - 0.2 %   Neutrophils Relative % 82 %   Neutro Abs 9.7 (H) 1.7 - 7.7 K/uL   Lymphocytes Relative 10 %   Lymphs Abs 1.2 0.7 - 4.0 K/uL   Monocytes Relative 6 %   Monocytes Absolute 0.7 0.1 - 1.0 K/uL   Eosinophils Relative 1 %   Eosinophils Absolute 0.2 0.0 - 0.5 K/uL   Basophils Relative 0 %   Basophils Absolute 0.0 0.0 - 0.1 K/uL   Immature Granulocytes 1 %   Abs Immature Granulocytes 0.07 0.00 - 0.07 K/uL    Comment: Performed at [facility] [address], [place], [place] [address]  Troponin I (High Sensitivity)     Status: Abnormal   Collection Time: 12/28/18  4:18 PM  Result Value Ref Range   Troponin I (High Sensitivity) 81 (H) <18 ng/L    Comment: (NOTE) Elevated high sensitivity troponin I (hsTnI) values and significant  changes across serial measurements may suggest ACS but many other  chronic and acute conditions are known to elevate hsTnI results.  Refer to the "Links" section for chest pain algorithms and additional  guidance. Performed at [facility], [place] [address] [place], [place] [address]   Brain natriuretic peptide     Status: Abnormal   Collection Time: 12/28/18  4:18 PM  Result Value Ref Range   B Natriuretic Peptide  161.7 (H) 0.0 - 100.0 pg/mL    Comment: Performed at [facility], [place] [address], [place], [place] [address]  APTT     Status: Abnormal   Collection Time: 12/28/18  4:18 PM  Result Value Ref Range   aPTT 45 (H) 24 - 36 seconds    Comment:        IF BASELINE aPTT IS ELEVATED, SUGGEST PATIENT RISK ASSESSMENT BE USED TO DETERMINE APPROPRIATE ANTICOAGULANT THERAPY. Performed at [facility], [place] [address] [place], [place] [address]   Type and screen [place]     Status: None (Preliminary result)   Collection Time: 12/28/18  5:30 PM  Result Value Ref Range   ABO/RH(D) O POS    Antibody Screen NEG    Sample Expiration 12/31/2018,2359    Unit Number [id number]    Blood Component Type RED CELLS,LR    Unit division 00    Status of Unit ISSUED    Transfusion Status OK TO TRANSFUSE    Crossmatch Result      Compatible Performed at [facility], [place] [address], [place], [place] [address]   Prepare RBC     Status: None   Collection Time: 12/28/18  5:30 PM  Result Value Ref Range   Order Confirmation      ORDER PROCESSED BY BLOOD BANK Performed at [facility], [place] [address] [place], [place] [address]   Troponin I (High Sensitivity)     Status: Abnormal   Collection Time: 12/28/18  5:36 PM  Result Value Ref Range   Troponin I (High Sensitivity) 78 (H) <18 ng/L    Comment: (NOTE) Elevated high sensitivity troponin I (hsTnI) values and significant  changes across serial measurements may suggest ACS but many other  chronic and acute conditions are known to elevate hsTnI results.  Refer to the "Links" section for chest pain algorithms and additional  guidance. Performed at [facility], [place] [address], [place], [place] [address]    Dg Chest  Port 1 View  Result Date: 12/28/2018 CLINICAL DATA:  Shortness of breath EXAM: PORTABLE CHEST 1 VIEW COMPARISON:  December 23, 2018 FINDINGS: The mediastinal contour and cardiac silhouette are normal. Patchy consolidation of the right mid and lung base are identified with right pleural effusion. The left lung is clear. The bony structures are stable. IMPRESSION: Pneumonia of the right mid and lung base with associated right pleural effusion. Electronically Signed   By: Abelardo Diesel M.D.   On: 12/28/2018 16:23    Pending Labs Unresulted Labs (From admission, onward)    Start     Ordered   12/28/18 1639  SARS CORONAVIRUS 2 Nasal Swab Aptima Multi Swab  (Asymptomatic Patients Labs)  Once,   STAT    Question Answer Comment  Is this test for diagnosis or screening Screening   Symptomatic for COVID-19 as defined by CDC No   Hospitalized for COVID-19 No   Admitted to ICU for COVID-19 No   Previously tested for COVID-19 Yes   Resident in a congregate (group) care setting No   Employed in healthcare setting No      12/28/18 1638   Signed and Held  Comprehensive metabolic panel  Tomorrow morning,   R     Signed and Held   Signed and Held  CBC WITH DIFFERENTIAL  Tomorrow morning,   R     Signed and Held   Signed and Held  APTT  Once,   R     Signed and Held   Signed and Held  Protime-INR  Once,   R     Signed and Held   Signed and Held  Basic metabolic panel  Daily,   R     Signed and Held          Vitals/Pain Today's Vitals   12/28/18 1852 12/28/18 1900 12/28/18 1901 12/28/18 1910  BP: 111/75 113/67  113/79  Pulse: 100 99  99  Resp: (!) 31 (!) 29  (!) 26  Temp: 99.1 F (37.3 C)   99 F (37.2 C)  TempSrc: Oral   Oral  SpO2: 98% 97%    Weight:      Height:      PainSc:   5      Isolation Precautions No active isolations  Medications Medications  AeroChamber Plus Flo-Vu Large MISC 1 each (has no administration in time range)  0.9 %  sodium chloride infusion (has no  administration in time range)  furosemide (LASIX) injection 40 mg (has no administration in time range)  aspirin chewable tablet 243 mg (243 mg Oral Given 12/28/18 1622)  albuterol (VENTOLIN HFA) 108 (90 Base) MCG/ACT inhaler 2 puff (2 puffs Inhalation Given 12/28/18 1623)  HYDROcodone-acetaminophen (NORCO/VICODIN) 5-325 MG per tablet 1 tablet (1 tablet Oral Given 12/28/18 1801)    M[employer] One asssist     Focused Assessments Pt AO x 4, CO SOB, lungs sound diminish mostly on the right side. SR on monitor getting first unit of blood, family member at th bedside, pt place on 2L [place] for comfort.   R Recommendations: See Admitting Provider Note  Report given to:   Additional Notes:

## 2018-12-28 NOTE — ED Provider Notes (Signed)
[facility] EMERGENCY DEPARTMENT Provider Note   CSN: [id number] Arrival date & time: 12/28/18  1415    History   Chief Complaint Chief Complaint  Patient presents with  . Shortness of Breath    HPI Charles Riggs. is a 63 y.o. male.     HPI Patient presents via EMS with his wife for evaluation of syncopal episode.  He reports that he was out on his front porch for around 30 minutes.  Began to feel very hot and unwell.  Went inside to get a bottle of water.  Was feeling short of breath and weak.  Some dizziness.  Took 2 drinks and felt like he was going to pass out.  Sat in his recliner and had a syncopal episode.  His wife states that he was unconscious for around 2 minutes.  She called 911 and laid him on the floor per their instructions.  EMS transported him here.  He reports that he still feels unwell, somewhat dizzy, short of breath, and in mild tightness in his chest.  Denies other recent illness.  Had aortic valve replacement on 7/21.  Took 81 mg aspirin this morning.  Past Medical History:  Diagnosis Date  . Acute on chronic diastolic heart failure ([place])   . Anemia in chronic kidney disease   . Anxiety   . Aortic insufficiency   . Arthritis   . CHF (congestive heart failure) ([place])   . CKD (chronic kidney disease)   . Essential hypertension   . Nerve pain   . Non-rheumatic aortic regurgitation   . Obstructive sleep apnea    cpap  . Quadricuspid aortic valve   . S/P minimally invasive aortic valve replacement with bioprosthetic valve 12/16/2018   25 mm Edwards Inspiris Resilia stented bovine pericardial tissue valve via right mini thoracotomy approach  . Severe aortic insufficiency   . SOB (shortness of breath)     Patient Active Problem List   Diagnosis Date Noted  . Pleural effusion on right 12/28/2018  . Syncope 12/28/2018  . Acute blood loss anemia   . S/P minimally invasive aortic valve replacement with bioprosthetic valve 12/16/2018  .  Anemia in chronic kidney disease   . Chronic kidney disease (CKD)   . Elevated troponin 08/29/2018  . Severe aortic insufficiency   . Acute on chronic diastolic heart failure ([place])   . Nonrheumatic aortic valve insufficiency   . Essential hypertension   . Obstructive sleep apnea   . Quadricuspid aortic valve   . Chest pain in adult 08/27/2018  . LEG PAIN 04/26/2010  . DEPRESSION 08/19/2009  . RENAL CALCULUS 01/05/2009  . DM 10/12/2008  . COCAINE ABUSE, HX OF 06/16/2007  . ASTHMA 03/24/2007  . GERD 03/24/2007    Past Surgical History:  Procedure Laterality Date  . AORTIC VALVE REPLACEMENT N/A 12/16/2018   Procedure: MINIMALLY INVASIVE AORTIC VALVE REPLACEMENT (AVR) using Inspiris Aortic valve 35mm.;  Surgeon: Rexene Alberts, MD;  Location: [facility];  Service: Open Heart Surgery;  Laterality: N/A;  . HERNIA REPAIR    . RIB PLATING  12/16/2018   Procedure: Rib Plating;  Surgeon: Rexene Alberts, MD;  Location: [facility] OR;  Service: Open Heart Surgery;;  . RIGHT/LEFT HEART CATH AND CORONARY ANGIOGRAPHY N/A 10/14/2018   Procedure: RIGHT/LEFT HEART CATH AND CORONARY ANGIOGRAPHY;  Surgeon: Nigel Mormon, MD;  Location: [facility] CV LAB;  Service: Cardiovascular;  Laterality: N/A;  . TEE WITHOUT CARDIOVERSION N/A 11/11/2018   Procedure:  TRANSESOPHAGEAL ECHOCARDIOGRAM (TEE);  Surgeon: Nigel Mormon, MD;  Location: [facility] ENDOSCOPY;  Service: Cardiovascular;  Laterality: N/A;  . TEE WITHOUT CARDIOVERSION N/A 12/16/2018   Procedure: TRANSESOPHAGEAL ECHOCARDIOGRAM (TEE);  Surgeon: Rexene Alberts, MD;  Location: [facility];  Service: Open Heart Surgery;  Laterality: N/A;        Home Medications    Prior to Admission medications   Medication Sig Start Date End Date Taking? Authorizing Provider  albuterol (PROVENTIL HFA;VENTOLIN HFA) 108 (90 Base) MCG/ACT inhaler Inhale 2 puffs into the lungs every 6 (six) hours as needed for wheezing or shortness of breath.    [provider]   albuterol (PROVENTIL) (2.5 MG/3ML) 0.083% nebulizer solution Take 3 mLs (2.5 mg total) by nebulization every 6 (six) hours as needed for wheezing or shortness of breath. 11/14/17   Noemi Chapel, MD  aspirin EC 81 MG tablet Take 81 mg by mouth daily.    [provider]  budesonide-formoterol (SYMBICORT) 160-4.5 MCG/ACT inhaler Inhale 2 puffs into the lungs 2 (two) times daily.    [provider]  COMBIVENT RESPIMAT 20-100 MCG/ACT AERS respimat Inhale 2 puffs into the lungs daily as needed for shortness of breath. 12/10/16   Law, Bea Graff, PA-C  dextromethorphan-guaiFENesin (MUCINEX DM) 30-600 MG 12hr tablet Take 1 tablet by mouth 2 (two) times daily.    [provider]  ferrous [id number] C-folic acid (TRINSICON / FOLTRIN) capsule Take 1 capsule by mouth 3 (three) times daily after meals. 12/20/18 01/19/19  Antony Odea, PA-C  fluticasone (FLONASE) 50 MCG/ACT nasal spray Place 2 sprays into both nostrils daily as needed for allergies or rhinitis.    [provider]  furosemide (LASIX) 40 MG tablet Take 1 tablet (40 mg total) by mouth daily for 5 days. 12/20/18 12/28/18  Antony Odea, PA-C  gabapentin (NEURONTIN) 600 MG tablet Take 600 mg by mouth 3 (three) times daily.    [provider]  hydrOXYzine (VISTARIL) 25 MG capsule Take 25 mg by mouth 3 (three) times daily.    [provider]  metoprolol tartrate (LOPRESSOR) 25 MG tablet Take 1 tablet (25 mg total) by mouth 2 (two) times daily. 12/20/18 12/20/19  Antony Odea, PA-C  montelukast (SINGULAIR) 10 MG tablet Take 10 mg by mouth every evening.    [provider]  omeprazole (PRILOSEC) 40 MG capsule TAKE ONE CAPSULE BY MOUTH DAILY Patient taking differently: Take 40 mg by mouth daily.  12/01/18   [name], Reynold Bowen, MD  rosuvastatin (CRESTOR) 10 MG tablet Take 1 tablet (10 mg total) by mouth daily at 6 PM. 08/28/18   [name], Reynold Bowen, MD    Family History  Family History  Problem Relation Age of Onset  . COPD Father   . Cancer Brother     Social History Social History   Tobacco Use  . Smoking status: Former Smoker    Types: Cigarettes  . Smokeless tobacco: Never Used  . Tobacco comment: 30 PY  Substance Use Topics  . Alcohol use: No  . Drug use: No     Allergies   Patient has no known allergies.   Review of Systems Review of Systems  Respiratory: Positive for chest tightness and shortness of breath.   Cardiovascular: Positive for chest pain.  Neurological: Positive for dizziness and syncope.  All other systems reviewed and are negative.    Physical Exam Updated Vital Signs BP 112/74 (BP Location: Right Arm)   Pulse (!) 103  Temp 98.6 F (37 C) (Oral)   Resp (!) 25   Ht 5\' 11"  (1.803 m)   Wt 108.4 kg   SpO2 100%   BMI 33.33 kg/m   Physical Exam Vitals signs and nursing note reviewed.  Constitutional:      Appearance: He is well-developed.     Comments: Appears mildly distressed but nontoxic  HENT:     Head: Normocephalic and atraumatic.     Mouth/Throat:     Mouth: Mucous membranes are moist.  Eyes:     Conjunctiva/sclera: Conjunctivae normal.  Neck:     Musculoskeletal: Neck supple.  Cardiovascular:     Rate and Rhythm: Normal rate and regular rhythm.     Heart sounds: No murmur.  Pulmonary:     Effort: Pulmonary effort is normal. Tachypnea present.     Breath sounds: Examination of the right-middle field reveals decreased breath sounds. Examination of the right-lower field reveals decreased breath sounds. Decreased breath sounds present.     Comments: Causes between sentences to catch breath but satting in the 90s Chest:     Comments: Well-healing recent surgical incision to the right upper chest Abdominal:     Palpations: Abdomen is soft.     Tenderness: There is no abdominal tenderness.  Musculoskeletal:     Right lower leg: No edema.     Left lower leg: No edema.  Skin:    General: Skin  is warm and dry.  Neurological:     Mental Status: He is alert and oriented to person, place, and time.  Psychiatric:        Mood and Affect: Mood is anxious.      ED Treatments / Results  Labs (all labs ordered are listed, but only abnormal results are displayed) Labs Reviewed  BASIC METABOLIC PANEL - Abnormal; Notable for the following components:      Result Value   Glucose, Bld 125 (*)    Creatinine, Ser 1.77 (*)    Calcium 8.6 (*)    GFR calc non Af Amer 41 (*)    GFR calc Af Amer 47 (*)    All other components within normal limits  CBC WITH DIFFERENTIAL/PLATELET - Abnormal; Notable for the following components:   WBC 11.9 (*)    RBC 2.84 (*)    Hemoglobin 6.6 (*)    HCT 22.7 (*)    MCV 79.9 (*)    MCH 23.2 (*)    MCHC 29.1 (*)    Platelets 406 (*)    Neutro Abs 9.7 (*)    All other components within normal limits  BRAIN NATRIURETIC PEPTIDE - Abnormal; Notable for the following components:   B Natriuretic Peptide 161.7 (*)    All other components within normal limits  APTT - Abnormal; Notable for the following components:   aPTT 45 (*)    All other components within normal limits  TROPONIN I (HIGH SENSITIVITY) - Abnormal; Notable for the following components:   Troponin I (High Sensitivity) 81 (*)    All other components within normal limits  TROPONIN I (HIGH SENSITIVITY) - Abnormal; Notable for the following components:   Troponin I (High Sensitivity) 78 (*)    All other components within normal limits  SARS CORONAVIRUS 2  COMPREHENSIVE METABOLIC PANEL  CBC WITH DIFFERENTIAL/PLATELET  PROTIME-INR  APTT  TYPE AND SCREEN  PREPARE RBC (CROSSMATCH)    EKG EKG Interpretation  Date/Time:  Sunday December 28 2018 14:39:40 EDT Ventricular Rate:  90 PR Interval:  QRS Duration: 106 QT Interval:  376 QTC Calculation: 461 R Axis:   27 Text Interpretation:  Sinus rhythm Ventricular premature complex Anteroseptal infarct, old Repol abnrm suggests ischemia,  lateral leads Confirmed by Sherwood Gambler [phone]) on 12/28/2018 3:40:54 PM   Radiology Dg Chest Port 1 View  Result Date: 12/28/2018 CLINICAL DATA:  Shortness of breath EXAM: PORTABLE CHEST 1 VIEW COMPARISON:  December 23, 2018 FINDINGS: The mediastinal contour and cardiac silhouette are normal. Patchy consolidation of the right mid and lung base are identified with right pleural effusion. The left lung is clear. The bony structures are stable. IMPRESSION: Pneumonia of the right mid and lung base with associated right pleural effusion. Electronically Signed   By: Abelardo Diesel M.D.   On: 12/28/2018 16:23    Procedures Procedures (including critical care time)  Medications Ordered in ED Medications  AeroChamber Plus Flo-Vu Large MISC 1 each (has no administration in time range)  furosemide (LASIX) injection 40 mg (has no administration in time range)  aspirin EC tablet 81 mg (has no administration in time range)  metoprolol tartrate (LOPRESSOR) tablet 25 mg (has no administration in time range)  rosuvastatin (CRESTOR) tablet 10 mg (has no administration in time range)  ferrous [id number] C-folic acid (TRINSICON / FOLTRIN) capsule 1 capsule (has no administration in time range)  gabapentin (NEURONTIN) tablet 600 mg (has no administration in time range)  albuterol (VENTOLIN HFA) 108 (90 Base) MCG/ACT inhaler 2 puff (has no administration in time range)  montelukast (SINGULAIR) tablet 10 mg (has no administration in time range)  sodium chloride flush (NS) 0.9 % injection 3 mL (has no administration in time range)  sodium chloride flush (NS) 0.9 % injection 3 mL (has no administration in time range)  0.9 %  sodium chloride infusion (has no administration in time range)  acetaminophen (TYLENOL) tablet 650 mg (has no administration in time range)  ondansetron (ZOFRAN) injection 4 mg (has no administration in time range)  furosemide (LASIX) injection 40 mg (has no administration in time  range)  aspirin chewable tablet 243 mg (243 mg Oral Given 12/28/18 1622)  albuterol (VENTOLIN HFA) 108 (90 Base) MCG/ACT inhaler 2 puff (2 puffs Inhalation Given 12/28/18 1623)  HYDROcodone-acetaminophen (NORCO/VICODIN) 5-325 MG per tablet 1 tablet (1 tablet Oral Given 12/28/18 1801)     Initial Impression / Assessment and Plan / ED Course  I have reviewed the triage vital signs and the nursing notes.  Pertinent labs & imaging results that were available during my care of the patient were reviewed by me and considered in my medical decision making (see chart for details).        Mr. Flax is a 63 year old male with a history of nonrheumatic aortic valve insufficiency with quadricuspid aortic valve status post noninvasive aortic valve replacement with bioprosthetic valve week and a half ago.  Also CKD, essential hypertension, OSA.  I reviewed his medical record.  Overall his presentation was concerning due to his respiratory and cardiac complaints.  Ultimately found to have right middle and lower lobe pleural effusions, profound anemia at 6.6.  His effusions are less concerning for pneumonia given his lack of clinical factors such as no significant leukocytosis or fever.  Did not give antibiotics.  Did consult vascular surgery Dr. Roxy Manns given that he is so recently postop.  He recommended transfusion of 2 units which was ordered as well as stat echo and admitted patient to his service for continued management.   Final Clinical Impressions(s) / ED  Diagnoses   Final diagnoses:  Pleural effusion  Syncope and collapse  Anemia, unspecified type  Aortic valve replaced  Shortness of breath    ED Discharge Orders    None       Tillie Fantasia, MD 12/28/18 2130    Sherwood Gambler, MD 12/28/18 2337

## 2018-12-28 NOTE — Progress Notes (Signed)
Spoke with Burnell Blanks regarding patient's stat echo ordered at 640 pm.  She paged echo tech and just got off phone with Lauren that I need Cardiologist name that will read echo.  Cards MD on call notified and he is calling Lauren to arrange being at bedside during time of which echo is performed.

## 2018-12-28 NOTE — ED Triage Notes (Signed)
EMS reported pt was sitting out side and felt to hot and went in the house. Pt had a syncope pt sitting in chair when he passed out. . Pt also reports SHOB . Wife in room and reports pt has not had fever or cough or sore throat. t

## 2018-12-28 NOTE — Progress Notes (Signed)
Brief Cardiology Note  Bedside echo performed to assess for pericardial effusion and rule out tamponade.  Patient is s/p bioprosthetic AVR on 7/21 via thoracotomy.  Ultrasound images taken in the standard views (parasternal long, apical four chamber, subxyphoid).    Assessment: Small pericardial effusion, best seen in subxyphoid window.  No evidence of tamponade.

## 2018-12-28 NOTE — H&P (Signed)
[address]       [place] [address]             [id number]          CARDIOTHORACIC SURGERY ADMISSION HISTORY AND PHYSICAL EXAM  PCP is [facility] Referring Provider is Tillie Fantasia, MD Primary Cardiologist is Nigel Mormon, MD  Chief Complaint:  syncope  HPI:  Patient is a 63 year old African-American male with history ofquadricuspid aortic valve andsevere aortic insufficiency, hypertension, left bundle branch block, asthma, obstructive sleep apnea, abnormal stress test and remote history of tobacco use whounderwent minimally invasive aortic valve replacement using a stented bovine pericardial tissue valve on December 16, 2018 via right minithoracotomy approach.  The patient's early postoperative recovery was notable for acute blood loss anemia but he did not require transfusion of blood products.  He otherwise did well and was discharged from the hospital on the fourth postoperative day.  Hemoglobin on the day of hospital discharge was 6.9 with hematocrit 22.5%.  Since hospital discharge the patient states that he has had a tendency to get short of breath easily with activity.  He has had some mild dizzy spells.  He otherwise felt fairly well until this morning when he was sitting outside in the hot weather.  He began not feeling well and also developed some pleuritic right-sided chest discomfort with increased shortness of breath.  He stood up to come inside the house and became lightheaded.  He called his wife who contacted 911 and help the patient get down to the floor.  He reportedly passed out for 1 to 2 minutes but came to spontaneously.  He was brought to the emergency department where he was notably in sinus rhythm with stable blood pressure.  CBC revealed hemoglobin 6.6 with hematocrit 22.7%.  Chest x-ray revealed opacity right lung base.  BNP was mildly elevated 162.  High-sensitivity troponin levels were minimally elevated 81 and have  remained flat.  EKG reveals sinus rhythm without significant ST segment elevation.  Cardiothoracic surgical consultation was requested.   Past Medical History:  Diagnosis Date  . Acute on chronic diastolic heart failure ([place])   . Anemia in chronic kidney disease   . Anxiety   . Aortic insufficiency   . Arthritis   . CHF (congestive heart failure) ([place])   . CKD (chronic kidney disease)   . Essential hypertension   . Nerve pain   . Non-rheumatic aortic regurgitation   . Obstructive sleep apnea    cpap  . Quadricuspid aortic valve   . S/P minimally invasive aortic valve replacement with bioprosthetic valve 12/16/2018   25 mm Edwards Inspiris Resilia stented bovine pericardial tissue valve via right mini thoracotomy approach  . Severe aortic insufficiency   . SOB (shortness of breath)     Past Surgical History:  Procedure Laterality Date  . AORTIC VALVE REPLACEMENT N/A 12/16/2018   Procedure: MINIMALLY INVASIVE AORTIC VALVE REPLACEMENT (AVR) using Inspiris Aortic valve 42mm.;  Surgeon: Rexene Alberts, MD;  Location: [facility];  Service: Open Heart Surgery;  Laterality: N/A;  . HERNIA REPAIR    . RIB PLATING  12/16/2018   Procedure: Rib Plating;  Surgeon: Rexene Alberts, MD;  Location: [facility] OR;  Service: Open Heart Surgery;;  . RIGHT/LEFT HEART CATH AND CORONARY ANGIOGRAPHY N/A 10/14/2018   Procedure: RIGHT/LEFT HEART CATH AND CORONARY ANGIOGRAPHY;  Surgeon: Nigel Mormon, MD;  Location: [facility] CV LAB;  Service: Cardiovascular;  Laterality: N/A;  .  TEE WITHOUT CARDIOVERSION N/A 11/11/2018   Procedure: TRANSESOPHAGEAL ECHOCARDIOGRAM (TEE);  Surgeon: Nigel Mormon, MD;  Location: [facility] ENDOSCOPY;  Service: Cardiovascular;  Laterality: N/A;  . TEE WITHOUT CARDIOVERSION N/A 12/16/2018   Procedure: TRANSESOPHAGEAL ECHOCARDIOGRAM (TEE);  Surgeon: Rexene Alberts, MD;  Location: [place];  Service: Open Heart Surgery;  Laterality: N/A;    Family History  Problem Relation Age of  Onset  . COPD Father   . Cancer Brother     Social History   Socioeconomic History  . Marital status: Married    Spouse name: Not on file  . Number of children: 4  . Years of education: Not on file  . Highest education level: Not on file  Occupational History  . Not on file  Social Needs  . Financial resource strain: Not on file  . Food insecurity    Worry: Not on file    Inability: Not on file  . Transportation needs    Medical: Not on file    Non-medical: Not on file  Tobacco Use  . Smoking status: Former Smoker    Types: Cigarettes  . Smokeless tobacco: Never Used  . Tobacco comment: 30 PY  Substance and Sexual Activity  . Alcohol use: No  . Drug use: No  . Sexual activity: Not on file  Lifestyle  . Physical activity    Days per week: Not on file    Minutes per session: Not on file  . Stress: Not on file  Relationships  . Social Herbalist on phone: Not on file    Gets together: Not on file    Attends religious service: Not on file    Active member of club or organization: Not on file    Attends meetings of clubs or organizations: Not on file    Relationship status: Not on file  . Intimate partner violence    Fear of current or ex partner: Not on file    Emotionally abused: Not on file    Physically abused: Not on file    Forced sexual activity: Not on file  Other Topics Concern  . Not on file  Social History Narrative  . Not on file    Prior to Admission medications   Medication Sig Start Date End Date Taking? Authorizing Provider  albuterol (PROVENTIL HFA;VENTOLIN HFA) 108 (90 Base) MCG/ACT inhaler Inhale 2 puffs into the lungs every 6 (six) hours as needed for wheezing or shortness of breath.    [provider]  albuterol (PROVENTIL) (2.5 MG/3ML) 0.083% nebulizer solution Take 3 mLs (2.5 mg total) by nebulization every 6 (six) hours as needed for wheezing or shortness of breath. 11/14/17   Noemi Chapel, MD  aspirin EC 81 MG tablet  Take 81 mg by mouth daily.    [provider]  budesonide-formoterol (SYMBICORT) 160-4.5 MCG/ACT inhaler Inhale 2 puffs into the lungs 2 (two) times daily.    [provider]  COMBIVENT RESPIMAT 20-100 MCG/ACT AERS respimat Inhale 2 puffs into the lungs daily as needed for shortness of breath. 12/10/16   Law, Bea Graff, PA-C  dextromethorphan-guaiFENesin (MUCINEX DM) 30-600 MG 12hr tablet Take 1 tablet by mouth 2 (two) times daily.    [provider]  ferrous [id number] C-folic acid (TRINSICON / FOLTRIN) capsule Take 1 capsule by mouth 3 (three) times daily after meals. 12/20/18 01/19/19  Antony Odea, PA-C  fluticasone (FLONASE) 50 MCG/ACT nasal spray Place 2 sprays into both nostrils daily as  needed for allergies or rhinitis.    [provider]  furosemide (LASIX) 40 MG tablet Take 1 tablet (40 mg total) by mouth daily for 5 days. 12/20/18 12/28/18  Antony Odea, PA-C  gabapentin (NEURONTIN) 600 MG tablet Take 600 mg by mouth 3 (three) times daily.    [provider]  hydrOXYzine (VISTARIL) 25 MG capsule Take 25 mg by mouth 3 (three) times daily.    [provider]  metoprolol tartrate (LOPRESSOR) 25 MG tablet Take 1 tablet (25 mg total) by mouth 2 (two) times daily. 12/20/18 12/20/19  Antony Odea, PA-C  montelukast (SINGULAIR) 10 MG tablet Take 10 mg by mouth every evening.    [provider]  omeprazole (PRILOSEC) 40 MG capsule TAKE ONE CAPSULE BY MOUTH DAILY 12/01/18   [name], Manish J, MD  rosuvastatin (CRESTOR) 10 MG tablet Take 1 tablet (10 mg total) by mouth daily at 6 PM. 08/28/18   [name], Reynold Bowen, MD    Current Facility-Administered Medications  Medication Dose Route Frequency Provider Last Rate Last Dose  . 0.9 %  sodium chloride infusion  10 mL/hr Intravenous Once Tillie Fantasia, MD      . AeroChamber Plus Flo-Vu Large MISC 1 each  1 each Other Once Tillie Fantasia, MD      . furosemide  (LASIX) injection 40 mg  40 mg Intravenous Once Rexene Alberts, MD       Current Outpatient Medications  Medication Sig Dispense Refill  . albuterol (PROVENTIL HFA;VENTOLIN HFA) 108 (90 Base) MCG/ACT inhaler Inhale 2 puffs into the lungs every 6 (six) hours as needed for wheezing or shortness of breath.    Marland Kitchen albuterol (PROVENTIL) (2.5 MG/3ML) 0.083% nebulizer solution Take 3 mLs (2.5 mg total) by nebulization every 6 (six) hours as needed for wheezing or shortness of breath. 75 mL 12  . aspirin EC 81 MG tablet Take 81 mg by mouth daily.    . budesonide-formoterol (SYMBICORT) 160-4.5 MCG/ACT inhaler Inhale 2 puffs into the lungs 2 (two) times daily.    . COMBIVENT RESPIMAT 20-100 MCG/ACT AERS respimat Inhale 2 puffs into the lungs daily as needed for shortness of breath. 1 Inhaler 0  . dextromethorphan-guaiFENesin (MUCINEX DM) 30-600 MG 12hr tablet Take 1 tablet by mouth 2 (two) times daily.    . ferrous [id number] C-folic acid (TRINSICON / FOLTRIN) capsule Take 1 capsule by mouth 3 (three) times daily after meals. 90 capsule 0  . fluticasone (FLONASE) 50 MCG/ACT nasal spray Place 2 sprays into both nostrils daily as needed for allergies or rhinitis.    . furosemide (LASIX) 40 MG tablet Take 1 tablet (40 mg total) by mouth daily for 5 days. 5 tablet 11  . gabapentin (NEURONTIN) 600 MG tablet Take 600 mg by mouth 3 (three) times daily.    . hydrOXYzine (VISTARIL) 25 MG capsule Take 25 mg by mouth 3 (three) times daily.    . metoprolol tartrate (LOPRESSOR) 25 MG tablet Take 1 tablet (25 mg total) by mouth 2 (two) times daily. 60 tablet 2  . montelukast (SINGULAIR) 10 MG tablet Take 10 mg by mouth every evening.    Marland Kitchen omeprazole (PRILOSEC) 40 MG capsule TAKE ONE CAPSULE BY MOUTH DAILY 90 capsule 1  . rosuvastatin (CRESTOR) 10 MG tablet Take 1 tablet (10 mg total) by mouth daily at 6 PM. 60 tablet 2    No Known Allergies    Review of Systems:   General:  decreased appetite, decreased  energy  Cardiac:  no chest pain with exertion, no chest pain at rest, + SOB with exertion, + resting SOB, no PND, no orthopnea, no palpitations, no arrhythmia, no atrial fibrillation, no LE edema, + dizzy spells, + syncope  Respiratory:  + shortness of breath, no home oxygen, no productive cough, no dry cough, no bronchitis, no wheezing, no hemoptysis, no asthma, + pain with inspiration or cough, + sleep apnea, + CPAP at night  GI:   no difficulty swallowing, no reflux, no frequent heartburn, no hiatal hernia, no abdominal pain, no constipation, no diarrhea, no hematochezia, no hematemesis, no melena  GU:   no dysuria,  no frequency, no urinary tract infection, no hematuria, no enlarged prostate, no kidney stones, + kidney disease  Vascular:  no pain suggestive of claudication, no pain in feet, no leg cramps, no varicose veins, no DVT, no non-healing foot ulcer  Neuro:   no stroke, no TIA's, no seizures, no headaches, no temporary blindness one eye,  no slurred speech, no peripheral neuropathy, no chronic pain, no instability of gait, no memory/cognitive dysfunction  Musculoskeletal: no arthritis , no joint swelling, no myalgias, no difficulty walking, normal mobility   Skin:   no rash, no itching, no skin infections, no pressure sores or ulcerations  Psych:   no anxiety, no depression, no nervousness, no unusual recent stress  Eyes:   no blurry vision, no floaters, no recent vision changes, does not wears glasses or contacts  ENT:   no hearing loss, no loose or painful teeth  Hematologic:  no easy bruising, no abnormal bleeding, no clotting disorder, no frequent epistaxis  Endocrine:  no diabetes, does not check CBG's at home     Physical Exam:   BP 99/73   Pulse 93   Temp (!) 97.2 F (36.2 C) (Oral)   Resp (!) 29   Ht 5\' 11"  (1.803 m)   Wt 108.4 kg   SpO2 100%   BMI 33.33 kg/m   General:  Moderately obese AA male NAD   HEENT:  Unremarkable   Neck:   no JVD, no bruits, no adenopathy    Chest:   clear to auscultation, diminished breath sounds right lung base, no wheezes, no rhonchi   CV:   RRR, no  murmur   Abdomen:  soft, non-tender, no masses   Extremities:  warm, well-perfused, pulses palpable, mild lower extremity edema  Rectal/GU  Deferred  Neuro:   Grossly non-focal and symmetrical throughout  Skin:   Clean and dry, no rashes, no breakdown  Diagnostic Tests:  Lab Results: Recent Labs    12/28/18 1618  WBC 11.9*  HGB 6.6*  HCT 22.7*  PLT 406*   BMET:  Recent Labs    12/28/18 1618  NA 137  K 4.7  CL 105  CO2 23  GLUCOSE 125*  BUN 19  CREATININE 1.77*  CALCIUM 8.6*    Results for KLEBER, CREAN (MRN [id number]) as of 12/28/2018 18:42  Ref. Range 12/28/2018 16:18 12/28/2018 17:36  B Natriuretic Peptide Latest Ref Range: 0.0 - 100.0 pg/mL 161.7 (H)   Troponin I (High Sensitivity) Latest Ref Range: <18 ng/L 81 (H) 78 (H)     CBG (last 3)  No results for input(s): GLUCAP in the last 72 hours. PT/INR:  No results for input(s): LABPROT, INR in the last 72 hours.  CXR:  PORTABLE CHEST 1 VIEW  COMPARISON:  December 23, 2018  FINDINGS: The mediastinal contour and cardiac silhouette are normal. Patchy consolidation  of the right mid and lung base are identified with right pleural effusion. The left lung is clear. The bony structures are stable.  IMPRESSION: Pneumonia of the right mid and lung base with associated right pleural effusion.   Electronically Signed   By: Abelardo Diesel M.D.   On: 12/28/2018 16:23     Impression:  Patient suffered a brief syncopal episode today after he had been sitting outside that is likely related to severe anemia with acute blood loss anemia complicating chronic anemia in the setting of stage III chronic kidney disease.  He has reportedly been dyspneic and experiencing mild dizzy spells ever since hospital discharge.  Hemoglobin and hematocrit are low but essentially unchanged over the past week.  He has also  developed symptomatic right pleural effusion with increased shortness of breath and right-sided pleuritic chest pain.  Plan:  We will obtain stat 2D echocardiogram to rule out pericardial effusion and pericardial tamponade.  We will also obtain CT angiogram of the chest to rule out pulmonary embolus.  The patient will be transfused 2 units packed red blood cells.  We will consider right thoracentesis for treatment of his symptomatic right pleural effusion depending on how he does overnight.  We will keep patient n.p.o. until results of echocardiogram have been completed.     Valentina Gu. Roxy Manns, MD 12/28/2018 6:46 PM

## 2018-12-28 NOTE — ED Notes (Signed)
Patient verbalizes understanding of discharge instructions . Opportunity for questions and answers were provided . Armband removed by staff ,Pt discharged from ED. W/C  offered at D/C  and Declined W/C at D/C and was escorted to lobby by RN.  

## 2018-12-29 ENCOUNTER — Encounter (HOSPITAL_COMMUNITY): Payer: Self-pay | Admitting: Physician Assistant

## 2018-12-29 ENCOUNTER — Inpatient Hospital Stay (HOSPITAL_COMMUNITY): Payer: No Typology Code available for payment source

## 2018-12-29 ENCOUNTER — Other Ambulatory Visit: Payer: Self-pay | Admitting: *Deleted

## 2018-12-29 DIAGNOSIS — J4520 Mild intermittent asthma, uncomplicated: Secondary | ICD-10-CM

## 2018-12-29 DIAGNOSIS — I359 Nonrheumatic aortic valve disorder, unspecified: Secondary | ICD-10-CM

## 2018-12-29 DIAGNOSIS — J90 Pleural effusion, not elsewhere classified: Secondary | ICD-10-CM

## 2018-12-29 DIAGNOSIS — I313 Pericardial effusion (noninflammatory): Secondary | ICD-10-CM

## 2018-12-29 HISTORY — PX: IR THORACENTESIS ASP PLEURAL SPACE W/IMG GUIDE: IMG5380

## 2018-12-29 LAB — BODY FLUID CELL COUNT WITH DIFFERENTIAL
Eos, Fluid: 1 %
Lymphs, Fluid: 24 %
Monocyte-Macrophage-Serous Fluid: 17 % — ABNORMAL LOW (ref 50–90)
Neutrophil Count, Fluid: 58 % — ABNORMAL HIGH (ref 0–25)
Total Nucleated Cell Count, Fluid: 2800 cu mm — ABNORMAL HIGH (ref 0–1000)

## 2018-12-29 LAB — COMPREHENSIVE METABOLIC PANEL
ALT: 14 U/L (ref 0–44)
AST: 14 U/L — ABNORMAL LOW (ref 15–41)
Albumin: 2.9 g/dL — ABNORMAL LOW (ref 3.5–5.0)
Alkaline Phosphatase: 50 U/L (ref 38–126)
Anion gap: 12 (ref 5–15)
BUN: 19 mg/dL (ref 8–23)
CO2: 22 mmol/L (ref 22–32)
Calcium: 8.6 mg/dL — ABNORMAL LOW (ref 8.9–10.3)
Chloride: 104 mmol/L (ref 98–111)
Creatinine, Ser: 1.81 mg/dL — ABNORMAL HIGH (ref 0.61–1.24)
GFR calc Af Amer: 46 mL/min — ABNORMAL LOW (ref >60)
GFR calc non Af Amer: 39 mL/min — ABNORMAL LOW (ref >60)
Glucose, Bld: 119 mg/dL — ABNORMAL HIGH (ref 70–99)
Potassium: 4.9 mmol/L (ref 3.5–5.1)
Sodium: 138 mmol/L (ref 135–145)
Total Bilirubin: 1.2 mg/dL (ref 0.3–1.2)
Total Protein: 6.2 g/dL — ABNORMAL LOW (ref 6.5–8.1)

## 2018-12-29 LAB — CBC WITH DIFFERENTIAL/PLATELET
Abs Immature Granulocytes: 0.07 10*3/uL (ref 0.00–0.07)
Basophils Absolute: 0 10*3/uL (ref 0.0–0.1)
Basophils Relative: 0 %
Eosinophils Absolute: 0.1 10*3/uL (ref 0.0–0.5)
Eosinophils Relative: 1 %
HCT: 25.7 % — ABNORMAL LOW (ref 39.0–52.0)
Hemoglobin: 7.9 g/dL — ABNORMAL LOW (ref 13.0–17.0)
Immature Granulocytes: 1 %
Lymphocytes Relative: 12 %
Lymphs Abs: 1.4 10*3/uL (ref 0.7–4.0)
MCH: 24.3 pg — ABNORMAL LOW (ref 26.0–34.0)
MCHC: 30.7 g/dL (ref 30.0–36.0)
MCV: 79.1 fL — ABNORMAL LOW (ref 80.0–100.0)
Monocytes Absolute: 0.9 10*3/uL (ref 0.1–1.0)
Monocytes Relative: 7 %
Neutro Abs: 9.6 10*3/uL — ABNORMAL HIGH (ref 1.7–7.7)
Neutrophils Relative %: 79 %
Platelets: 404 10*3/uL — ABNORMAL HIGH (ref 150–400)
RBC: 3.25 MIL/uL — ABNORMAL LOW (ref 4.22–5.81)
RDW: 14.6 % (ref 11.5–15.5)
WBC: 12.1 10*3/uL — ABNORMAL HIGH (ref 4.0–10.5)
nRBC: 0 % (ref 0.0–0.2)

## 2018-12-29 LAB — APTT: aPTT: 37 seconds — ABNORMAL HIGH (ref 24–36)

## 2018-12-29 LAB — ECHOCARDIOGRAM LIMITED
Height: 71 in
Weight: 3836.8 oz

## 2018-12-29 LAB — PREPARE RBC (CROSSMATCH)

## 2018-12-29 LAB — PROTIME-INR
INR: 1.3 — ABNORMAL HIGH (ref 0.8–1.2)
Prothrombin Time: 15.7 seconds — ABNORMAL HIGH (ref 11.4–15.2)

## 2018-12-29 LAB — GRAM STAIN

## 2018-12-29 LAB — HEMOGLOBIN AND HEMATOCRIT, BLOOD
HCT: 27.9 % — ABNORMAL LOW (ref 39.0–52.0)
Hemoglobin: 8.9 g/dL — ABNORMAL LOW (ref 13.0–17.0)

## 2018-12-29 MED ORDER — BUDESONIDE-FORMOTEROL FUMARATE 160-4.5 MCG/ACT IN AERO: 2.0000 | INHALATION_SPRAY | Freq: Two times a day (BID) | RESPIRATORY_TRACT | 1 refills | Status: DC

## 2018-12-29 MED ORDER — OXYCODONE-ACETAMINOPHEN 5-325 MG PO TABS
1.0000 | ORAL_TABLET | Freq: Three times a day (TID) | ORAL | Status: DC | PRN
  Administered 2018-12-29 – 2018-12-31 (×4): 1 via ORAL
  Filled 2018-12-29 (×3): qty 1

## 2018-12-29 MED ORDER — SODIUM CHLORIDE 0.9% IV SOLUTION: Freq: Once | INTRAVENOUS | Status: DC

## 2018-12-29 MED ORDER — LIDOCAINE HCL (PF) 1 % IJ SOLN
INTRAMUSCULAR | Status: DC | PRN
  Administered 2018-12-29: 10 mL

## 2018-12-29 MED ORDER — FUROSEMIDE 10 MG/ML IJ SOLN
40.0000 mg | Freq: Two times a day (BID) | INTRAMUSCULAR | Status: AC
  Administered 2018-12-29 (×2): 40 mg via INTRAVENOUS
  Filled 2018-12-29 (×2): qty 4

## 2018-12-29 MED ORDER — SODIUM CHLORIDE 0.9% IV SOLUTION
Freq: Once | INTRAVENOUS | Status: AC
  Administered 2018-12-29: 05:00:00 via INTRAVENOUS

## 2018-12-29 MED ORDER — LIDOCAINE HCL 1 % IJ SOLN
INTRAMUSCULAR | Status: AC
  Filled 2018-12-29: qty 20

## 2018-12-29 NOTE — Progress Notes (Signed)
  Echocardiogram 2D Echocardiogram limited has been performed.  Darlina Sicilian M 12/29/2018, 8:20 AM

## 2018-12-29 NOTE — TOC Initial Note (Signed)
Transition of Care (TOC) - Initial/Assessment Note    Patient Details  Name: Charles Riggs. MRN: [id number] Date of Birth: 1956-05-22  Transition of Care [facility]) CM/SW Contact:    Bethena Roys, RN Phone Number: 12/29/2018, 1:46 PM  Clinical Narrative:  Pt presented for syncopal episode. PTA from home with support of wife. Plan will be to transition home without any need for [facility]. Patient has DME RW in the home from [place]. [facility] uses [facility]- PCP is Lavonne Chick- CSW is Kellie Simmering- please fax d/c summary and any new medications to fax- [phone]. [facility] [address] CM will continue to monitor for additional transition of care needs.                  Expected Discharge Plan: Home/Self Care Barriers to Discharge: Continued Medical Work up   Patient Goals and CMS Choice Patient states their goals for this hospitalization and ongoing recovery are:: "to return home"   Choice offered to / list presented to : NA  Expected Discharge Plan and Services Expected Discharge Plan: Home/Self Care In-house Referral: NA Discharge Planning Services: CM Consult Post Acute Care Choice: NA Living arrangements for the past 2 months: Single Family Home                           HH Arranged: NA          Prior Living Arrangements/Services Living arrangements for the past 2 months: Single Family Home Lives with:: Spouse Patient language and need for interpreter reviewed:: Yes Do you feel safe going back to the place where you live?: Yes      Need for Family Participation in Patient Care: No (Comment) Care giver support system in place?: No (comment)   Criminal Activity/Legal Involvement Pertinent to Current Situation/Hospitalization: No - Comment as needed  Activities of Daily Living      Permission Sought/Granted Permission sought to share information with : Family Supports, Investment banker, corporate  granted to share info w AGENCY: [facility]- information to be faxed to PCP at transition        Emotional Assessment Appearance:: Appears stated age Attitude/Demeanor/Rapport: Engaged Affect (typically observed): Accepting Orientation: : Oriented to Situation, Oriented to  Time, Oriented to Place, Oriented to Self Alcohol / Substance Use: Not Applicable Psych Involvement: No (comment)  Admission diagnosis:  Syncope Patient Active Problem List   Diagnosis Date Noted  . Pleural effusion on right 12/28/2018  . Syncope 12/28/2018  . Acute blood loss anemia   . S/P minimally invasive aortic valve replacement with bioprosthetic valve 12/16/2018  . Anemia in chronic kidney disease   . Chronic kidney disease (CKD)   . Elevated troponin 08/29/2018  . Severe aortic insufficiency   . Acute on chronic diastolic heart failure ([place])   . Nonrheumatic aortic valve insufficiency   . Essential hypertension   . Obstructive sleep apnea   . Quadricuspid aortic valve   . Chest pain in adult 08/27/2018  . LEG PAIN 04/26/2010  . DEPRESSION 08/19/2009  . RENAL CALCULUS 01/05/2009  . DM 10/12/2008  . COCAINE ABUSE, HX OF 06/16/2007  . ASTHMA 03/24/2007  . GERD 03/24/2007   PCP:  [facility]:   [facility] [address], [place] [address] [place] [address] Phone: [phone] Fax: [phone]  [place]  [place], [place] - [place] [phone] [place] [address] Phone: [phone] Fax: [phone]     Social Determinants of Health (SDOH) Interventions    Readmission Risk Interventions Readmission Risk Prevention Plan 12/18/2018  Transportation Screening Complete  PCP or Specialist Appt within 3-5 Days Complete  HRI or [place] Complete  Social Work Consult for [place] Planning/Counseling Complete  Palliative Care  Screening Not Applicable  Medication Review Press photographer) Referral to Pharmacy  Some recent data might be hidden

## 2018-12-29 NOTE — Procedures (Signed)
PROCEDURE SUMMARY:  Successful US guided right thoracentesis. Yielded 1.9 L of blood. Patient tolerated procedure well. No immediate complications. EBL = trace  Specimen was sent for labs.  Post procedure chest X-ray reveals no pneumothorax  Amamda Curbow S Carroll Lingelbach PA-C 12/29/2018 11:02 AM

## 2018-12-29 NOTE — Progress Notes (Signed)
[address]       [place] [address]             [phone]     CARDIOTHORACIC SURGERY PROGRESS NOTE  Subjective: Feels some better after transfusion but still SOB and pleuritic right sided chest pain  Objective: Vital signs in last 24 hours: Temp:  [97.2 F (36.2 C)-99.6 F (37.6 C)] 98.6 F (37 C) (08/03 0735) Pulse Rate:  [88-107] 104 (08/03 0735) Cardiac Rhythm: Sinus tachycardia;Heart block (08/03 0701) Resp:  [20-35] 23 (08/03 0700) BP: (95-122)/(45-90) 120/70 (08/03 0735) SpO2:  [92 %-100 %] 100 % (08/03 0735) Weight:  [108.4 kg-109 kg] 108.8 kg (08/03 0700)  Physical Exam:  Rhythm:   sinus  Breath sounds: Diminished on right side  Heart sounds:  RRR  Incisions:  Clean and dry  Abdomen:  Soft, non-distended, non-tender  Extremities:  Warm, well-perfused    Intake/Output from previous day: 08/02 0701 - 08/03 0700 In: 1048.7 [P.O.:120; I.V.:268.7; Blood:660] Out: 1081 [Urine:1080; Stool:1] Intake/Output this shift: Total I/O In: 375 [P.O.:60; Blood:315] Out: 425 [Urine:425]  Lab Results: Recent Labs    12/28/18 1618 12/29/18 0351  WBC 11.9* 12.1*  HGB 6.6* 7.9*  HCT 22.7* 25.7*  PLT 406* 404*   BMET:  Recent Labs    12/28/18 1618 12/29/18 0351  NA 137 138  K 4.7 4.9  CL 105 104  CO2 23 22  GLUCOSE 125* 119*  BUN 19 19  CREATININE 1.77* 1.81*  CALCIUM 8.6* 8.6*    CBG (last 3)  No results for input(s): GLUCAP in the last 72 hours. PT/INR:   Recent Labs    12/29/18 0351  LABPROT 15.7*  INR 1.3*    CTA:  CT ANGIOGRAPHY CHEST WITH CONTRAST  TECHNIQUE: Multidetector CT imaging of the chest was performed using the standard protocol during bolus administration of intravenous contrast. Multiplanar CT image reconstructions and MIPs were obtained to evaluate the vascular anatomy.  CONTRAST:  88mL OMNIPAQUE IOHEXOL 350 MG/ML SOLN  COMPARISON:  11/18/2018  FINDINGS: Cardiovascular: Changes consistent with  prior aortic valve replacement are noted. The thoracic aorta shows no evidence of focal dissection. No atherosclerotic calcifications are noted. Origin of the brachiocephalic vessels appears within normal limits. The heart is not significantly enlarged. No large pericardial effusion is noted. The pulmonary artery shows a normal branching pattern without evidence of definitive pulmonary embolism. The opacification of the peripheral branches of the pulmonary artery is somewhat limited due to timing of the contrast bolus.  Mediastinum/Nodes: Thoracic inlet is within normal limits. Postsurgical changes are noted in the anterior chest wall on the right consistent with the mini thoracotomy. No hilar or mediastinal adenopathy is noted. No significant mediastinal hematoma is noted.  Lungs/Pleura: Large right-sided pleural effusion is seen with some compressive atelectasis in the right lower lobe. Mild atelectasis likely related to the recent surgery in the right middle lobe is noted anteriorly. The left lung is well aerated without focal infiltrate. No evidence of pneumothorax is seen.  Upper Abdomen: Visualized upper abdomen is within normal limits.  Musculoskeletal: Again seen anteriorly within the rib cage and sternum are postsurgical changes from the prior mini thoracotomy. Anterior rib fracture on the right involving the third rib is seen. This is only minimally displaced. No left-sided rib fractures are seen. Degenerative changes of the thoracic spine are noted.  Review of the MIP images confirms the above findings.  IMPRESSION: Postsurgical changes consistent with aortic valve replacement.  Large right-sided pleural effusion similar to that seen on recent plain film examination. Associated compressive changes are noted within the right lung.  No evidence of pulmonary emboli.   Electronically Signed   By: Inez Catalina M.D.   On: 12/28/2018 22:43       ECHOCARDIOGRAM LIMITED REPORT    Patient Name:   Charles Riggs. Date of Exam: 12/29/2018 Medical Rec #:  [id number]          Height:       71.0 in Accession #:    [id number]         Weight:       239.8 lb Date of Birth:  05/28/1956         BSA:          2.28 m Patient Age:    63 years           BP:           120/70 mmHg Patient Gender: M                  HR:           105 bpm. Exam Location:  Inpatient    Procedure: Limited Echo, Limited Color Doppler and Cardiac Doppler  STAT ECHO  Indications:    Pericardial effusion 423.9 / I31.3                 Aortic Valve Disorder 424.1 / I35.9   History:        Patient has prior history of Echocardiogram examinations, most                 recent 12/16/2018. CHF Signs/Symptoms: Shortness of Breath.                 Aortic Valve: A 11mm Edwards bovine aortic valve bioprosthesis                 Procedure Date: 12/16/2018 Chronic kideny disease. Sleep apnea.   Sonographer:    Darlina Sicilian RDCS Referring Phys: [place]    1. The left ventricle has mildly reduced systolic function, with an ejection fraction of 45-50%. The cavity size was normal. Left ventricular diastolic parameters were normal. There is abnormal septal motion consistent with post-operative status.  2. The right ventricle has normal systolc function. There is no increase in right ventricular wall thickness.  3. Trivial circumferential pericardial effusion is present.  4. A 80mm an Edwards bovine bioprosthesis valve is present in the aortic position. Procedure Date: 12/16/2018 Normal aortic valve prosthesis. Mean PG 13 mmHg, which is likely normal. No regurgitation.  5. The ascending aorta is normal in size and structure.  6. Compared to previous transthoracic study on 08/27/2018, bioprosthetic aortic valve is new.  FINDINGS  Left Ventricle: The left ventricle has mildly reduced systolic function, with an ejection fraction of 45-50%. The cavity  size was normal. There is no increase in left ventricular wall thickness. Left ventricular diastolic parameters were normal. There  is abnormal (paradoxical) septal motion consistent with post-operative status.    Right Ventricle: The right ventricle has normal systolic function. There is no increase in right ventricular wall thickness.  Left Atrium: Left atrial size was normal in size.  Right Atrium: Right atrial size was normal in size. Right atrial pressure is estimated at 10 mmHg.  Pericardium: Trivial pericardial effusion is present. The pericardial effusion is circumferential.  Mitral Valve: The mitral valve is  normal in structure. Mitral valve regurgitation is not visualized by color flow Doppler.  Tricuspid Valve: The tricuspid valve was grossly normal. Tricuspid valve regurgitation was not visualized by color flow Doppler.  Aortic Valve: Aortic valve regurgitation was not visualized by color flow Doppler. A 73mm Edwards bovine aortic valve bioprosthesis valve is present in the aortic position. Procedure Date: 12/16/2018 Normal aortic valve prosthesis.  Pulmonic Valve: The pulmonic valve was grossly normal. Pulmonic valve regurgitation is not visualized by color flow Doppler.  Aorta: The ascending aorta is normal in size and structure.    +--------------+--------++ LEFT VENTRICLE              +----------------+----------++ +--------------+--------++      Diastology                 PLAX 2D                     +----------------+----------++ +--------------+--------++      LV e' lateral:  14.40 cm/s LVIDd:        4.10 cm       +----------------+----------++ +--------------+--------++      LV E/e' lateral:8.2        LVIDs:        3.40 cm       +----------------+----------++ +--------------+--------++      LV e' medial:   9.46 cm/s  LV PW:        1.50 cm       +----------------+----------++ +--------------+--------++      LV E/e'  medial: 12.5       LV IVS:       1.50 cm       +----------------+----------++ +--------------+--------++ LVOT diam:    1.90 cm  +--------------+--------++ LV SV:        27 ml    +--------------+--------++ LV SV Index:  11.31    +--------------+--------++ LVOT Area:    2.84 cm +--------------+--------++                        +--------------+--------++   +------------------+---------++ LV Volumes (MOD)            +------------------+---------++ LV area d, A2C:   37.70 cm +------------------+---------++ LV area d, A4C:   37.20 cm +------------------+---------++ LV area s, A2C:   24.70 cm +------------------+---------++ LV area s, A4C:   26.40 cm +------------------+---------++ LV major d, A2C:  9.80 cm   +------------------+---------++ LV major d, A4C:  9.09 cm   +------------------+---------++ LV major s, A2C:  8.50 cm   +------------------+---------++ LV major s, A4C:  8.33 cm   +------------------+---------++ LV vol d, MOD A2C:120.0 ml  +------------------+---------++ LV vol d, MOD A4C:125.0 ml  +------------------+---------++ LV vol s, MOD A2C:61.0 ml   +------------------+---------++ LV vol s, MOD A4C:69.3 ml   +------------------+---------++ LV SV MOD A2C:    59.0 ml   +------------------+---------++ LV SV MOD A4C:    125.0 ml  +------------------+---------++ LV SV MOD BP:     61.2 ml   +------------------+---------++  +-----------+-------++----------++ LEFT ATRIUM       Index      +-----------+-------++----------++ LA diam:   3.50 cm1.54 cm/m +-----------+-------++----------++  +------------------+------------++ AORTIC VALVE                   +------------------+------------++ AV Area (Vmax):   1.32 cm     +------------------+------------++ AV Area (Vmean):  1.32 cm     +------------------+------------++ AV Area (VTI):    1.36  cm     +------------------+------------++  AV Vmax:          247.67 cm/s  +------------------+------------++ AV Vmean:         170.000 cm/s +------------------+------------++ AV VTI:           0.327 m      +------------------+------------++ AV Peak Grad:     24.5 mmHg    +------------------+------------++ AV Mean Grad:     13.7 mmHg    +------------------+------------++ LVOT Vmax:        115.00 cm/s  +------------------+------------++ LVOT Vmean:       79.150 cm/s  +------------------+------------++ LVOT VTI:         0.156 m      +------------------+------------++ LVOT/AV VTI ratio:0.48         +------------------+------------++   +-------------+-------++ AORTA                +-------------+-------++ Ao Root diam:2.40 cm +-------------+-------++  +--------------+----------++ MITRAL VALVE              +--------------+-------+ +--------------+----------++  SHUNTS                MV Area (PHT):9.14 cm    +--------------+-------+ +--------------+----------++  Systemic VTI: 0.16 m  MV PHT:       24.07 msec  +--------------+-------+ +--------------+----------++  Systemic Diam:1.90 cm MV Decel Time:83 msec     +--------------+-------+ +--------------+----------++ +--------------+-----------++ MV E velocity:118.00 cm/s +--------------+-----------++    Vernell Leep MD Electronically signed by Vernell Leep MD Signature Date/Time: 12/29/2018/8:28:55 AM     Assessment/Plan:  Syncope likely related to severe anemia.  Hgb/Hct up appropriately after transfusion.  ECHO reveals normal functioning bioprosthetic tissue valve in the aortic position.  Left ventricular function unchanged from preoperatively.  Trivial pericardial effusion.  CTA negative for pulmonary embolus but confirms the presence of large right pleural effusion.  We will transfuse 1 additional unit packed red blood cells.  Continue  IV Lasix for diuresis.  Therapeutic ultrasound-guided right thoracentesis today by interventional radiology.  Charles Alberts, MD 12/29/2018 8:57 AM

## 2018-12-30 ENCOUNTER — Inpatient Hospital Stay (HOSPITAL_COMMUNITY): Payer: No Typology Code available for payment source

## 2018-12-30 LAB — BASIC METABOLIC PANEL
Anion gap: 13 (ref 5–15)
BUN: 16 mg/dL (ref 8–23)
CO2: 23 mmol/L (ref 22–32)
Calcium: 8.5 mg/dL — ABNORMAL LOW (ref 8.9–10.3)
Chloride: 99 mmol/L (ref 98–111)
Creatinine, Ser: 1.59 mg/dL — ABNORMAL HIGH (ref 0.61–1.24)
GFR calc Af Amer: 54 mL/min — ABNORMAL LOW (ref >60)
GFR calc non Af Amer: 46 mL/min — ABNORMAL LOW (ref >60)
Glucose, Bld: 134 mg/dL — ABNORMAL HIGH (ref 70–99)
Potassium: 3.8 mmol/L (ref 3.5–5.1)
Sodium: 135 mmol/L (ref 135–145)

## 2018-12-30 LAB — PATHOLOGIST SMEAR REVIEW

## 2018-12-30 LAB — CBC
HCT: 25.1 % — ABNORMAL LOW (ref 39.0–52.0)
Hemoglobin: 8 g/dL — ABNORMAL LOW (ref 13.0–17.0)
MCH: 25.6 pg — ABNORMAL LOW (ref 26.0–34.0)
MCHC: 31.9 g/dL (ref 30.0–36.0)
MCV: 80.2 fL (ref 80.0–100.0)
Platelets: 414 10*3/uL — ABNORMAL HIGH (ref 150–400)
RBC: 3.13 MIL/uL — ABNORMAL LOW (ref 4.22–5.81)
RDW: 15.6 % — ABNORMAL HIGH (ref 11.5–15.5)
WBC: 11.1 10*3/uL — ABNORMAL HIGH (ref 4.0–10.5)
nRBC: 0.3 % — ABNORMAL HIGH (ref 0.0–0.2)

## 2018-12-30 MED ORDER — POLYETHYLENE GLYCOL 3350 17 G PO PACK
17.0000 g | PACK | Freq: Every day | ORAL | Status: DC
  Administered 2018-12-30 – 2018-12-31 (×2): 17 g via ORAL
  Filled 2018-12-30 (×2): qty 1

## 2018-12-30 MED ORDER — FUROSEMIDE 10 MG/ML IJ SOLN
40.0000 mg | Freq: Once | INTRAMUSCULAR | Status: AC
  Administered 2018-12-30: 07:00:00 40 mg via INTRAVENOUS
  Filled 2018-12-30: qty 4

## 2018-12-30 NOTE — Progress Notes (Addendum)
      [address]       ,[place] [address]             [phone]      Subjective:  Feeling somewhat better.  His chest is sensitive where his incision and chest tubes were.  He also noted he was mildly dizzy with walking this morning.    Objective: Vital signs in last 24 hours: Temp:  [98.9 F (37.2 C)-99.6 F (37.6 C)] 98.9 F (37.2 C) (08/04 0427) Pulse Rate:  [102-116] 102 (08/04 0427) Cardiac Rhythm: Sinus tachycardia (08/03 2000) Resp:  [20] 20 (08/04 0427) BP: (112-118)/(78-85) 112/84 (08/04 0427) SpO2:  [93 %-96 %] 95 % (08/04 0427) Weight:  [106.1 kg] 106.1 kg (08/04 0427)  Intake/Output from previous day: 08/03 0701 - 08/04 0700 In: 525 [P.O.:210; Blood:315] Out: 4800 [Urine:2900] Intake/Output this shift: Total I/O In: 240 [P.O.:240] Out: 375 [Urine:375]  General appearance: alert, cooperative and no distress Heart: regular rate and rhythm Lungs: clear to auscultation bilaterally Abdomen: soft, non-tender; bowel sounds normal; no masses,  no organomegaly Extremities: extremities normal, atraumatic, no cyanosis or edema Wound: clean and dr  Lab Results: Recent Labs    12/29/18 0351 12/29/18 1133 12/30/18 0547  WBC 12.1*  --  11.1*  HGB 7.9* 8.9* 8.0*  HCT 25.7* 27.9* 25.1*  PLT 404*  --  414*   BMET:  Recent Labs    12/29/18 0351 12/30/18 0547  NA 138 135  K 4.9 3.8  CL 104 99  CO2 22 23  GLUCOSE 119* 134*  BUN 19 16  CREATININE 1.81* 1.59*  CALCIUM 8.6* 8.5*    PT/INR:  Recent Labs    12/29/18 0351  LABPROT 15.7*  INR 1.3*   ABG    Component Value Date/Time   PHART 7.340 (L) 12/16/2018 1637   HCO3 24.9 12/16/2018 1637   TCO2 23 12/16/2018 2134   ACIDBASEDEF 1.0 12/16/2018 1637   O2SAT 95.0 12/16/2018 1637   CBG (last 3)  No results for input(s): GLUCAP in the last 72 hours.  Assessment/Plan:  1. CV- NSR, mildly tachycardic, BP is okay- on Lopressor 2. Pulm- S/P Thoracentesis with removal of 1.9 L of  bloody fluid, CXR shows small right pleural effusion 3. Post operative anemia- Hgb is 8.0 after transfusion of 3 units of blood, he is on iron as well, may benefit from additional unit with dizziness with ambulation, will defer to Dr. Roxy Manns 4. Dispo- patient stable, will repeat CXR in AM to ensure Right pleural effusion remains stable, possibly ready for d/c in AM   LOS: 2 days    Ellwood Handler 12/30/2018   I have seen and examined the patient and agree with the assessment and plan as outlined.  Small residual left pleural effusion after thoracentesis.  Recheck CBC and CXR in am tomorrow, possibly d/c home if stable/improved.  Rexene Alberts, MD 12/30/2018 3:13 PM

## 2018-12-30 NOTE — Progress Notes (Signed)
CARDIAC REHAB PHASE I   PRE:  Rate/Rhythm: 102 ST    BP: sitting 121/82    SaO2: 100 RA  MODE:  Ambulation: 430 ft   POST:  Rate/Rhythm: 117 ST    BP: sitting 124/77     SaO2: 100 RA  Pt able to walk independently with supervision. He had increased SOB with distance, accompanied with increased HR, up to 117 ST. Pt blamed his mask. Gave IS (his is at home) and only 1000 mL now, was doing 2500 mL at home. Encouraged IS use and more walking today.  [place], ACSM 12/30/2018 9:47 AM

## 2018-12-30 NOTE — Discharge Summary (Signed)
Physician Discharge Summary  Patient ID: Charles Riggs. MRN: [id number] DOB/AGE: 12/17/1955 63 y.o.  Admit date: 12/28/2018 Discharge date: 12/31/2018  Admission Diagnoses:  Patient Active Problem List   Diagnosis Date Noted  . Pleural effusion on right 12/28/2018  . Syncope 12/28/2018  . Acute blood loss anemia   . S/P minimally invasive aortic valve replacement with bioprosthetic valve 12/16/2018  . Anemia in chronic kidney disease   . Chronic kidney disease (CKD)   . Elevated troponin 08/29/2018  . Severe aortic insufficiency   . Acute on chronic diastolic heart failure ([place])   . Nonrheumatic aortic valve insufficiency   . Essential hypertension   . Obstructive sleep apnea   . Quadricuspid aortic valve   . Chest pain in adult 08/27/2018  . LEG PAIN 04/26/2010  . DEPRESSION 08/19/2009  . RENAL CALCULUS 01/05/2009  . DM 10/12/2008  . COCAINE ABUSE, HX OF 06/16/2007  . ASTHMA 03/24/2007  . GERD 03/24/2007   Discharge Diagnoses:   Patient Active Problem List   Diagnosis Date Noted  . Pleural effusion on right 12/28/2018  . Syncope 12/28/2018  . Acute blood loss anemia   . S/P minimally invasive aortic valve replacement with bioprosthetic valve 12/16/2018  . Anemia in chronic kidney disease   . Chronic kidney disease (CKD)   . Elevated troponin 08/29/2018  . Severe aortic insufficiency   . Acute on chronic diastolic heart failure ([place])   . Nonrheumatic aortic valve insufficiency   . Essential hypertension   . Obstructive sleep apnea   . Quadricuspid aortic valve   . Chest pain in adult 08/27/2018  . LEG PAIN 04/26/2010  . DEPRESSION 08/19/2009  . RENAL CALCULUS 01/05/2009  . DM 10/12/2008  . COCAINE ABUSE, HX OF 06/16/2007  . ASTHMA 03/24/2007  . GERD 03/24/2007   Discharged Condition: good  History of Present Illness:  Charles Riggs is a 63 yo AA male well known to [facility].  He is S/P minimally invasive aortic valve replacement using a stented bovine  pericardial tissue valve on December 16, 2018 via right minithoracotomy approach.   The patient's post operative hospital course was unremarkable.  He was discharged home on POD #4, however his hemoglobin was 6.9 and hematocrit was 22.5.  The patient noticed he was getting short of breath with activity.  He was also experiencing episodes of dizziness spells.  The patient was sitting outside in the hot weather when he started not to feel well.  This was associated with right sided chest discomfort with increased shortness of breath.  He stood up to come inside but became lightheaded.  He got his wife to call [phone] and she helped him to floor.   Workup in the ED showed the patient to be in NSR with a stable blood pressure.  CBC showed a further decrease in his hemoglobin level down to 6.6,  CXR showed a opacification in the right lung base.  Troponin levels were mildly elevated, but this would be expected with recent heart surgery.   He was admitted for further care.   Hospital Course:   The patient received 3 units of packed cells for his post operative anemia.  Repeat CXR showed a right pleural effusion was present.  He was referred to IR for Thoracentesis.  This was performed on 12/29/2018 and 1900 ml of blood fluid was removed.  The patient tolerated the procedure without difficulty.  His most recent hemoglobin level is 7.9.  He will completed a short course  of iron therapy.  Final CXR showed stable appearance of right sided pleural effusion.  He overall is feeling better.  He is ambulating without difficulty.  His incisions are healing without evidence of infection.   Treatments: Thoracentesis, Blood Transfusion with 3 units of packed cells  Discharge Exam: Blood pressure 111/75, pulse (!) 101, temperature 99.2 F (37.3 C), temperature source Oral, resp. rate 16, height 5\' 11"  (1.803 m), weight 106.2 kg, SpO2 95 %.  General appearance: alert, cooperative and no distress Heart: regular rate and rhythm Lungs:  wheezes mild bilaterally and mildly diminished on right base Abdomen: soft, non-tender; bowel sounds normal; no masses,  no organomegaly Extremities: extremities normal, atraumatic, no cyanosis or edema Wound: clean and dry   Discharge disposition: 01-Home or Self Care  Discharge Medications:   Allergies as of 12/31/2018   No Known Allergies     Medication List    TAKE these medications   acetaminophen 500 MG tablet Commonly known as: TYLENOL Take 500 mg by mouth 3 (three) times daily.   albuterol 108 (90 Base) MCG/ACT inhaler Commonly known as: VENTOLIN HFA Inhale 2 puffs into the lungs every 6 (six) hours as needed for wheezing or shortness of breath.   albuterol (2.5 MG/3ML) 0.083% nebulizer solution Commonly known as: PROVENTIL Take 3 mLs (2.5 mg total) by nebulization every 6 (six) hours as needed for wheezing or shortness of breath.   aspirin EC 81 MG tablet Take 81 mg by mouth daily.   budesonide-formoterol 160-4.5 MCG/ACT inhaler Commonly known as: SYMBICORT Inhale 2 puffs into the lungs 2 (two) times daily. What changed: when to take this   Combivent Respimat 20-100 MCG/ACT Aers respimat Generic drug: Ipratropium-Albuterol Inhale 2 puffs into the lungs daily as needed for shortness of breath.   dextromethorphan-guaiFENesin 30-600 MG 12hr tablet Commonly known as: MUCINEX DM Take 1 tablet by mouth 2 (two) times daily.   ferrous [id number] C-folic acid capsule Commonly known as: TRINSICON / FOLTRIN Take 1 capsule by mouth 3 (three) times daily after meals.   fluticasone 50 MCG/ACT nasal spray Commonly known as: FLONASE Place 2 sprays into both nostrils daily as needed for allergies or rhinitis.   furosemide 40 MG tablet Commonly known as: Lasix Take 1 tablet (40 mg total) by mouth daily for 5 days.   gabapentin 400 MG capsule Commonly known as: NEURONTIN Take 400 mg by mouth every 8 (eight) hours.   hydrOXYzine 25 MG capsule Commonly known  as: VISTARIL Take 25 mg by mouth 3 (three) times daily.   metoprolol tartrate 25 MG tablet Commonly known as: LOPRESSOR Take 1 tablet (25 mg total) by mouth 2 (two) times daily.   montelukast 10 MG tablet Commonly known as: SINGULAIR Take 10 mg by mouth every evening.   omeprazole 40 MG capsule Commonly known as: PRILOSEC TAKE ONE CAPSULE BY MOUTH DAILY   oxyCODONE-acetaminophen 5-325 MG tablet Commonly known as: PERCOCET/ROXICET Take 1 tablet by mouth every 8 (eight) hours as needed for moderate pain or severe pain. What changed:   when to take this  reasons to take this   rosuvastatin 10 MG tablet Commonly known as: CRESTOR Take 1 tablet (10 mg total) by mouth daily at 6 PM.      Follow-up Information    Rexene Alberts, MD Follow up on 01/05/2019.   Specialty: Cardiothoracic Surgery Why: Please get CXR 30 min prior to your appointment at [facility] located on first floor of our office buidling... you will also need  to have a CBC drawn at the lab prior to your appointment Contact information: [address] [place] [address] [phone]           Signed: Ellwood Handler 12/31/2018, 1:06 PM

## 2018-12-31 ENCOUNTER — Inpatient Hospital Stay (HOSPITAL_COMMUNITY): Payer: No Typology Code available for payment source

## 2018-12-31 ENCOUNTER — Encounter (HOSPITAL_COMMUNITY): Payer: Self-pay | Admitting: General Practice

## 2018-12-31 LAB — CBC
HCT: 25.1 % — ABNORMAL LOW (ref 39.0–52.0)
Hemoglobin: 7.9 g/dL — ABNORMAL LOW (ref 13.0–17.0)
MCH: 25.6 pg — ABNORMAL LOW (ref 26.0–34.0)
MCHC: 31.5 g/dL (ref 30.0–36.0)
MCV: 81.2 fL (ref 80.0–100.0)
Platelets: 458 10*3/uL — ABNORMAL HIGH (ref 150–400)
RBC: 3.09 MIL/uL — ABNORMAL LOW (ref 4.22–5.81)
RDW: 15.8 % — ABNORMAL HIGH (ref 11.5–15.5)
WBC: 9.2 10*3/uL (ref 4.0–10.5)
nRBC: 0 % (ref 0.0–0.2)

## 2018-12-31 LAB — BPAM RBC
Blood Product Expiration Date: 202008292359
Blood Product Expiration Date: 202008292359
Blood Product Expiration Date: 202008292359
ISSUE DATE / TIME: 202008021839
ISSUE DATE / TIME: 202008030431
Unit Type and Rh: 5100
Unit Type and Rh: 5100
Unit Type and Rh: 5100

## 2018-12-31 LAB — TYPE AND SCREEN
ABO/RH(D): O POS
Antibody Screen: NEGATIVE
Unit division: 0
Unit division: 0
Unit division: 0

## 2018-12-31 MED ORDER — FUROSEMIDE 40 MG PO TABS: 40.0000 mg | ORAL_TABLET | Freq: Every day | ORAL | 0 refills | Status: DC

## 2018-12-31 MED ORDER — OXYCODONE-ACETAMINOPHEN 5-325 MG PO TABS: 1.0000 | ORAL_TABLET | Freq: Three times a day (TID) | ORAL | 0 refills | Status: DC | PRN

## 2018-12-31 MED ORDER — FE FUMARATE-B12-VIT C-FA-IFC PO CAPS: 1.0000 | ORAL_CAPSULE | Freq: Three times a day (TID) | ORAL | 0 refills | Status: AC

## 2018-12-31 NOTE — TOC Transition Note (Signed)
Transition of Care [facility]) - CM/SW Discharge Note   Patient Details  Name: Charles Riggs. MRN: [id number] Date of Birth: 01/07/1956  [facility] CM/SW Contact:  Pollie Friar, RN Phone Number: 12/31/2018, 11:57 AM   Clinical Narrative:    Pt discharging home with self care. Pt has f/u at [facility] and with surgeon.  Per request d/c summary faxed to [place].  Pt has transportation home.   Final next level of care: Home/Self Care Barriers to Discharge: No Barriers Identified   Patient Goals and CMS Choice Patient states their goals for this hospitalization and ongoing recovery are:: "to return home"   Choice offered to / list presented to : NA  Discharge Placement                       Discharge Plan and Services In-house Referral: NA Discharge Planning Services: CM Consult Post Acute Care Choice: NA                    HH Arranged: NA          Social Determinants of Health (SDOH) Interventions     Readmission Risk Interventions Readmission Risk Prevention Plan 12/18/2018  Transportation Screening Complete  PCP or Specialist Appt within 3-5 Days Complete  HRI or [place] Complete  Social Work Consult for [place] Planning/Counseling Complete  Palliative Care Screening Not Applicable  Medication Review [employer]) Referral to Pharmacy  Some recent data might be hidden

## 2018-12-31 NOTE — Discharge Instructions (Signed)
We will provide a refill prescription on your Percocet, however this will be the final fill.  You will need to contact the primary provider who has been prescribing this medication to you previously.   Discharge Instructions:  1. You may shower, please wash incisions daily with soap and water and keep dry.  If you wish to cover wounds with dressing you may do so but please keep clean and change daily.  No tub baths or swimming until incisions have completely healed.  If your incisions become red or develop any drainage please call our office at [phone]  2. No Driving until cleared by Dr. Guy Sandifer office and you are no longer using narcotic pain medications  3. Monitor your weight daily.. Please use the same scale and weigh at same time... If you gain 5-10 lbs in 48 hours with associated lower extremity swelling, please contact our office at [phone]  4. Fever of 101.5 for at least 24 hours with no source, please contact our office at [phone]  5. Activity- up as tolerated, please walk at least 3 times per day.  Avoid strenuous activity  6. If any questions or concerns arise, please do not hesitate to contact our office at [phone]

## 2018-12-31 NOTE — Progress Notes (Addendum)
      [address]       ,[place] [address]             [phone]      Subjective:  No new complaints.  Continues to have pain/sensitivity along right chest.  Worries fluid will re-accumulate  Objective: Vital signs in last 24 hours: Temp:  [98.8 F (37.1 C)-99.2 F (37.3 C)] 99.2 F (37.3 C) (08/05 0541) Pulse Rate:  [96-108] 108 (08/05 0700) Cardiac Rhythm: Sinus tachycardia (08/05 0745) Resp:  [16-20] 16 (08/05 0541) BP: (116-123)/(56-81) 123/56 (08/05 0541) SpO2:  [95 %-100 %] 95 % (08/05 0700) Weight:  [106.2 kg] 106.2 kg (08/05 0541)  Intake/Output from previous day: 08/04 0701 - 08/05 0700 In: 940 [P.O.:940] Out: 2075 [Urine:2075] Intake/Output this shift: Total I/O In: -  Out: 400 [Urine:400]  General appearance: alert, cooperative and no distress Heart: regular rate and rhythm Lungs: wheezes mild bilaterally and mildly diminished on right base Abdomen: soft, non-tender; bowel sounds normal; no masses,  no organomegaly Extremities: extremities normal, atraumatic, no cyanosis or edema Wound: clean and dry  Lab Results: Recent Labs    12/30/18 0547 12/31/18 0451  WBC 11.1* 9.2  HGB 8.0* 7.9*  HCT 25.1* 25.1*  PLT 414* 458*   BMET:  Recent Labs    12/29/18 0351 12/30/18 0547  NA 138 135  K 4.9 3.8  CL 104 99  CO2 22 23  GLUCOSE 119* 134*  BUN 19 16  CREATININE 1.81* 1.59*  CALCIUM 8.6* 8.5*    PT/INR:  Recent Labs    12/29/18 0351  LABPROT 15.7*  INR 1.3*   ABG    Component Value Date/Time   PHART 7.340 (L) 12/16/2018 1637   HCO3 24.9 12/16/2018 1637   TCO2 23 12/16/2018 2134   ACIDBASEDEF 1.0 12/16/2018 1637   O2SAT 95.0 12/16/2018 1637   CBG (last 3)  No results for input(s): GLUCAP in the last 72 hours.  Assessment/Plan:  1.CV- Sinus Tachycardia, BP is stable- continue Lopressor 25 mg BID 2. Pulm- mild wheezing, continue home inhalers, continue IS... CXR shows small right pleural effusion 3. Post  operative anemia, relatively stable at 7.9, will continue iron 4. Pain- patient has been getting chronic Percocet prescriptions according PMDP review, will provide a few day supply, he will need to contact primary prescriber for refills 5. Dispo- patient stable, right pleural effusion is small, Hgb is relatively stable, will d/c home today if okay with Dr. Roxy Manns   LOS: 3 days    Ellwood Handler 12/31/2018  I have seen and examined the patient and agree with the assessment and plan as outlined.  D/C home today.  Will see again in office next Monday w/ CBC and CXR  Rexene Alberts, MD 12/31/2018 8:30 AM

## 2018-12-31 NOTE — Progress Notes (Signed)
Pt has been walking independently. Sts he feels well and that he will continue to increase his distance/duration at home with his walks. Reviewed ed. Good reception. [place], ACSM 10:16 AM 12/31/2018

## 2019-01-03 LAB — CULTURE, BODY FLUID W GRAM STAIN -BOTTLE: Culture: NO GROWTH

## 2019-01-04 ENCOUNTER — Other Ambulatory Visit: Payer: Self-pay

## 2019-01-04 ENCOUNTER — Inpatient Hospital Stay (HOSPITAL_COMMUNITY): Payer: No Typology Code available for payment source

## 2019-01-04 ENCOUNTER — Encounter (HOSPITAL_COMMUNITY): Payer: Self-pay | Admitting: Emergency Medicine

## 2019-01-04 ENCOUNTER — Emergency Department (HOSPITAL_COMMUNITY): Payer: No Typology Code available for payment source

## 2019-01-04 ENCOUNTER — Inpatient Hospital Stay (HOSPITAL_COMMUNITY)
Admission: EM | Admit: 2019-01-04 | Discharge: 2019-01-07 | DRG: 189 | Disposition: A | Discharge status: Home Health | Payer: No Typology Code available for payment source | Attending: Internal Medicine | Admitting: Internal Medicine

## 2019-01-04 DIAGNOSIS — J9601 Acute respiratory failure with hypoxia: Secondary | ICD-10-CM | POA: Diagnosis not present

## 2019-01-04 DIAGNOSIS — G4733 Obstructive sleep apnea (adult) (pediatric): Secondary | ICD-10-CM | POA: Diagnosis present

## 2019-01-04 DIAGNOSIS — R609 Edema, unspecified: Secondary | ICD-10-CM | POA: Diagnosis not present

## 2019-01-04 DIAGNOSIS — Z825 Family history of asthma and other chronic lower respiratory diseases: Secondary | ICD-10-CM | POA: Diagnosis not present

## 2019-01-04 DIAGNOSIS — M545 Low back pain: Secondary | ICD-10-CM | POA: Diagnosis present

## 2019-01-04 DIAGNOSIS — D62 Acute posthemorrhagic anemia: Secondary | ICD-10-CM | POA: Diagnosis present

## 2019-01-04 DIAGNOSIS — I130 Hypertensive heart and chronic kidney disease with heart failure and stage 1 through stage 4 chronic kidney disease, or unspecified chronic kidney disease: Secondary | ICD-10-CM | POA: Diagnosis present

## 2019-01-04 DIAGNOSIS — Z20828 Contact with and (suspected) exposure to other viral communicable diseases: Secondary | ICD-10-CM | POA: Diagnosis present

## 2019-01-04 DIAGNOSIS — I5042 Chronic combined systolic (congestive) and diastolic (congestive) heart failure: Secondary | ICD-10-CM | POA: Diagnosis present

## 2019-01-04 DIAGNOSIS — Z7951 Long term (current) use of inhaled steroids: Secondary | ICD-10-CM

## 2019-01-04 DIAGNOSIS — J942 Hemothorax: Secondary | ICD-10-CM | POA: Diagnosis present

## 2019-01-04 DIAGNOSIS — Z9889 Other specified postprocedural states: Secondary | ICD-10-CM

## 2019-01-04 DIAGNOSIS — Z9189 Other specified personal risk factors, not elsewhere classified: Secondary | ICD-10-CM

## 2019-01-04 DIAGNOSIS — E785 Hyperlipidemia, unspecified: Secondary | ICD-10-CM | POA: Diagnosis present

## 2019-01-04 DIAGNOSIS — R0602 Shortness of breath: Secondary | ICD-10-CM

## 2019-01-04 DIAGNOSIS — I129 Hypertensive chronic kidney disease with stage 1 through stage 4 chronic kidney disease, or unspecified chronic kidney disease: Secondary | ICD-10-CM | POA: Diagnosis not present

## 2019-01-04 DIAGNOSIS — N179 Acute kidney failure, unspecified: Secondary | ICD-10-CM | POA: Diagnosis present

## 2019-01-04 DIAGNOSIS — Z87891 Personal history of nicotine dependence: Secondary | ICD-10-CM | POA: Diagnosis not present

## 2019-01-04 DIAGNOSIS — Z953 Presence of xenogenic heart valve: Secondary | ICD-10-CM | POA: Diagnosis not present

## 2019-01-04 DIAGNOSIS — D631 Anemia in chronic kidney disease: Secondary | ICD-10-CM | POA: Diagnosis present

## 2019-01-04 DIAGNOSIS — J9811 Atelectasis: Secondary | ICD-10-CM | POA: Diagnosis present

## 2019-01-04 DIAGNOSIS — Z7982 Long term (current) use of aspirin: Secondary | ICD-10-CM | POA: Diagnosis not present

## 2019-01-04 DIAGNOSIS — Z79899 Other long term (current) drug therapy: Secondary | ICD-10-CM

## 2019-01-04 DIAGNOSIS — K219 Gastro-esophageal reflux disease without esophagitis: Secondary | ICD-10-CM | POA: Diagnosis present

## 2019-01-04 DIAGNOSIS — N183 Chronic kidney disease, stage 3 (moderate): Secondary | ICD-10-CM | POA: Diagnosis present

## 2019-01-04 DIAGNOSIS — I10 Essential (primary) hypertension: Secondary | ICD-10-CM | POA: Diagnosis present

## 2019-01-04 DIAGNOSIS — N189 Chronic kidney disease, unspecified: Secondary | ICD-10-CM | POA: Diagnosis present

## 2019-01-04 DIAGNOSIS — G8929 Other chronic pain: Secondary | ICD-10-CM | POA: Diagnosis present

## 2019-01-04 DIAGNOSIS — I351 Nonrheumatic aortic (valve) insufficiency: Secondary | ICD-10-CM | POA: Diagnosis present

## 2019-01-04 DIAGNOSIS — J45909 Unspecified asthma, uncomplicated: Secondary | ICD-10-CM | POA: Diagnosis present

## 2019-01-04 DIAGNOSIS — J90 Pleural effusion, not elsewhere classified: Secondary | ICD-10-CM | POA: Diagnosis present

## 2019-01-04 DIAGNOSIS — I5033 Acute on chronic diastolic (congestive) heart failure: Secondary | ICD-10-CM | POA: Diagnosis not present

## 2019-01-04 LAB — CBC WITH DIFFERENTIAL/PLATELET
Abs Immature Granulocytes: 0.07 10*3/uL (ref 0.00–0.07)
Abs Immature Granulocytes: 0.04 10*3/uL (ref 0.00–0.07)
Basophils Absolute: 0 10*3/uL (ref 0.0–0.1)
Basophils Absolute: 0 10*3/uL (ref 0.0–0.1)
Basophils Relative: 0 %
Basophils Relative: 0 %
Eosinophils Absolute: 0.6 10*3/uL — ABNORMAL HIGH (ref 0.0–0.5)
Eosinophils Absolute: 0.5 10*3/uL (ref 0.0–0.5)
Eosinophils Relative: 5 %
Eosinophils Relative: 6 %
HCT: 28.4 % — ABNORMAL LOW (ref 39.0–52.0)
HCT: 25.4 % — ABNORMAL LOW (ref 39.0–52.0)
Hemoglobin: 8.6 g/dL — ABNORMAL LOW (ref 13.0–17.0)
Hemoglobin: 7.8 g/dL — ABNORMAL LOW (ref 13.0–17.0)
Immature Granulocytes: 1 %
Immature Granulocytes: 1 %
Lymphocytes Relative: 12 %
Lymphocytes Relative: 15 %
Lymphs Abs: 1.3 10*3/uL (ref 0.7–4.0)
Lymphs Abs: 1.2 10*3/uL (ref 0.7–4.0)
MCH: 24.7 pg — ABNORMAL LOW (ref 26.0–34.0)
MCH: 24.3 pg — ABNORMAL LOW (ref 26.0–34.0)
MCHC: 30.3 g/dL (ref 30.0–36.0)
MCHC: 30.7 g/dL (ref 30.0–36.0)
MCV: 81.6 fL (ref 80.0–100.0)
MCV: 79.1 fL — ABNORMAL LOW (ref 80.0–100.0)
Monocytes Absolute: 1.1 10*3/uL — ABNORMAL HIGH (ref 0.1–1.0)
Monocytes Absolute: 0.8 10*3/uL (ref 0.1–1.0)
Monocytes Relative: 10 %
Monocytes Relative: 10 %
Neutro Abs: 7.7 10*3/uL (ref 1.7–7.7)
Neutro Abs: 5.6 10*3/uL (ref 1.7–7.7)
Neutrophils Relative %: 72 %
Neutrophils Relative %: 68 %
Platelets: 591 10*3/uL — ABNORMAL HIGH (ref 150–400)
Platelets: 558 10*3/uL — ABNORMAL HIGH (ref 150–400)
RBC: 3.48 MIL/uL — ABNORMAL LOW (ref 4.22–5.81)
RBC: 3.21 MIL/uL — ABNORMAL LOW (ref 4.22–5.81)
RDW: 16.4 % — ABNORMAL HIGH (ref 11.5–15.5)
RDW: 16.4 % — ABNORMAL HIGH (ref 11.5–15.5)
WBC: 10.8 10*3/uL — ABNORMAL HIGH (ref 4.0–10.5)
WBC: 8.1 10*3/uL (ref 4.0–10.5)
nRBC: 0 % (ref 0.0–0.2)
nRBC: 0 % (ref 0.0–0.2)

## 2019-01-04 LAB — PROTEIN, PLEURAL OR PERITONEAL FLUID: Total protein, fluid: 4.3 g/dL

## 2019-01-04 LAB — GLUCOSE, CAPILLARY
Glucose-Capillary: 115 mg/dL — ABNORMAL HIGH (ref 70–99)
Glucose-Capillary: 100 mg/dL — ABNORMAL HIGH (ref 70–99)
Glucose-Capillary: 120 mg/dL — ABNORMAL HIGH (ref 70–99)
Glucose-Capillary: 114 mg/dL — ABNORMAL HIGH (ref 70–99)

## 2019-01-04 LAB — COMPREHENSIVE METABOLIC PANEL
ALT: 14 U/L (ref 0–44)
AST: 16 U/L (ref 15–41)
Albumin: 2.9 g/dL — ABNORMAL LOW (ref 3.5–5.0)
Alkaline Phosphatase: 59 U/L (ref 38–126)
Anion gap: 14 (ref 5–15)
BUN: 25 mg/dL — ABNORMAL HIGH (ref 8–23)
CO2: 23 mmol/L (ref 22–32)
Calcium: 9 mg/dL (ref 8.9–10.3)
Chloride: 105 mmol/L (ref 98–111)
Creatinine, Ser: 2.28 mg/dL — ABNORMAL HIGH (ref 0.61–1.24)
GFR calc Af Amer: 35 mL/min — ABNORMAL LOW (ref >60)
GFR calc non Af Amer: 30 mL/min — ABNORMAL LOW (ref >60)
Glucose, Bld: 146 mg/dL — ABNORMAL HIGH (ref 70–99)
Potassium: 4.4 mmol/L (ref 3.5–5.1)
Sodium: 142 mmol/L (ref 135–145)
Total Bilirubin: 0.8 mg/dL (ref 0.3–1.2)
Total Protein: 6.8 g/dL (ref 6.5–8.1)

## 2019-01-04 LAB — VITAMIN B12: Vitamin B-12: 394 pg/mL (ref 180–914)

## 2019-01-04 LAB — IRON AND TIBC
Iron: 11 ug/dL — ABNORMAL LOW (ref 45–182)
Saturation Ratios: 4 % — ABNORMAL LOW (ref 17.9–39.5)
TIBC: 293 ug/dL (ref 250–450)
UIBC: 282 ug/dL

## 2019-01-04 LAB — GRAM STAIN

## 2019-01-04 LAB — RETICULOCYTES
Immature Retic Fract: 13.5 % (ref 2.3–15.9)
RBC.: 3.2 MIL/uL — ABNORMAL LOW (ref 4.22–5.81)
Retic Count, Absolute: 78.1 10*3/uL (ref 19.0–186.0)
Retic Ct Pct: 2.4 % (ref 0.4–3.1)

## 2019-01-04 LAB — BODY FLUID CELL COUNT WITH DIFFERENTIAL
Eos, Fluid: 2 %
Lymphs, Fluid: 65 %
Monocyte-Macrophage-Serous Fluid: 15 % — ABNORMAL LOW (ref 50–90)
Neutrophil Count, Fluid: 18 % (ref 0–25)
Total Nucleated Cell Count, Fluid: 333 cu mm (ref 0–1000)

## 2019-01-04 LAB — LACTATE DEHYDROGENASE: LDH: 268 U/L — ABNORMAL HIGH (ref 98–192)

## 2019-01-04 LAB — FOLATE: Folate: 23.3 ng/mL (ref >5.9)

## 2019-01-04 LAB — FERRITIN: Ferritin: 133 ng/mL (ref 24–336)

## 2019-01-04 LAB — BRAIN NATRIURETIC PEPTIDE: B Natriuretic Peptide: 152.8 pg/mL — ABNORMAL HIGH (ref 0.0–100.0)

## 2019-01-04 LAB — HIV ANTIBODY (ROUTINE TESTING W REFLEX): HIV Screen 4th Generation wRfx: NONREACTIVE

## 2019-01-04 LAB — MRSA PCR SCREENING: MRSA by PCR: NEGATIVE

## 2019-01-04 LAB — LACTATE DEHYDROGENASE, PLEURAL OR PERITONEAL FLUID: LD, Fluid: 1076 U/L — ABNORMAL HIGH (ref 3–23)

## 2019-01-04 LAB — SARS CORONAVIRUS 2 BY RT PCR (HOSPITAL ORDER, PERFORMED IN ~~LOC~~ HOSPITAL LAB): SARS Coronavirus 2: NEGATIVE

## 2019-01-04 MED ORDER — TECHNETIUM TC 99M DIETHYLENETRIAME-PENTAACETIC ACID
30.0000 | Freq: Once | INTRAVENOUS | Status: AC | PRN
  Administered 2019-01-04: 30 via RESPIRATORY_TRACT

## 2019-01-04 MED ORDER — ACETAMINOPHEN 500 MG PO TABS
500.0000 mg | ORAL_TABLET | Freq: Three times a day (TID) | ORAL | Status: DC
  Administered 2019-01-04 – 2019-01-07 (×4): 500 mg via ORAL
  Filled 2019-01-04 (×6): qty 1

## 2019-01-04 MED ORDER — SODIUM CHLORIDE 0.9% FLUSH
3.0000 mL | Freq: Two times a day (BID) | INTRAVENOUS | Status: DC
  Administered 2019-01-04 – 2019-01-07 (×6): 3 mL via INTRAVENOUS

## 2019-01-04 MED ORDER — ONDANSETRON HCL 4 MG/2ML IJ SOLN: 4.0000 mg | Freq: Four times a day (QID) | INTRAMUSCULAR | Status: DC | PRN

## 2019-01-04 MED ORDER — TECHNETIUM TO 99M ALBUMIN AGGREGATED
1.5000 | Freq: Once | INTRAVENOUS | Status: AC | PRN
  Administered 2019-01-04: 1.5 via INTRAVENOUS

## 2019-01-04 MED ORDER — MONTELUKAST SODIUM 10 MG PO TABS
10.0000 mg | ORAL_TABLET | Freq: Every evening | ORAL | Status: DC
  Administered 2019-01-04 – 2019-01-06 (×3): 10 mg via ORAL
  Filled 2019-01-04 (×3): qty 1

## 2019-01-04 MED ORDER — FE FUMARATE-B12-VIT C-FA-IFC PO CAPS
1.0000 | ORAL_CAPSULE | Freq: Three times a day (TID) | ORAL | Status: DC
  Administered 2019-01-04 – 2019-01-07 (×9): 1 via ORAL
  Filled 2019-01-04 (×9): qty 1

## 2019-01-04 MED ORDER — ROSUVASTATIN CALCIUM 5 MG PO TABS
10.0000 mg | ORAL_TABLET | Freq: Every day | ORAL | Status: DC
  Administered 2019-01-04 – 2019-01-06 (×3): 10 mg via ORAL
  Filled 2019-01-04 (×4): qty 2

## 2019-01-04 MED ORDER — MOMETASONE FURO-FORMOTEROL FUM 200-5 MCG/ACT IN AERO
2.0000 | INHALATION_SPRAY | Freq: Two times a day (BID) | RESPIRATORY_TRACT | Status: DC
  Administered 2019-01-04 – 2019-01-07 (×7): 2 via RESPIRATORY_TRACT
  Filled 2019-01-04: qty 8.8

## 2019-01-04 MED ORDER — ACETAMINOPHEN 325 MG PO TABS: 650.0000 mg | ORAL_TABLET | ORAL | Status: DC | PRN

## 2019-01-04 MED ORDER — DM-GUAIFENESIN ER 30-600 MG PO TB12
1.0000 | ORAL_TABLET | Freq: Two times a day (BID) | ORAL | Status: DC
  Administered 2019-01-04 – 2019-01-07 (×6): 1 via ORAL
  Filled 2019-01-04 (×7): qty 1

## 2019-01-04 MED ORDER — INSULIN ASPART 100 UNIT/ML ~~LOC~~ SOLN
0.0000 [IU] | Freq: Three times a day (TID) | SUBCUTANEOUS | Status: DC
  Administered 2019-01-06: 2 [IU] via SUBCUTANEOUS
  Administered 2019-01-07: 1 [IU] via SUBCUTANEOUS

## 2019-01-04 MED ORDER — GABAPENTIN 400 MG PO CAPS
400.0000 mg | ORAL_CAPSULE | Freq: Three times a day (TID) | ORAL | Status: DC
  Administered 2019-01-04: 400 mg via ORAL
  Filled 2019-01-04: qty 1

## 2019-01-04 MED ORDER — FLUTICASONE PROPIONATE 50 MCG/ACT NA SUSP
2.0000 | Freq: Every day | NASAL | Status: DC | PRN
  Filled 2019-01-04: qty 16

## 2019-01-04 MED ORDER — HYDROXYZINE HCL 25 MG PO TABS
25.0000 mg | ORAL_TABLET | Freq: Three times a day (TID) | ORAL | Status: DC
  Administered 2019-01-04 – 2019-01-07 (×9): 25 mg via ORAL
  Filled 2019-01-04 (×10): qty 1

## 2019-01-04 MED ORDER — SODIUM CHLORIDE 0.9 % IV SOLN: 250.0000 mL | INTRAVENOUS | Status: DC | PRN

## 2019-01-04 MED ORDER — GABAPENTIN 300 MG PO CAPS
300.0000 mg | ORAL_CAPSULE | Freq: Two times a day (BID) | ORAL | Status: DC
  Administered 2019-01-04 – 2019-01-07 (×6): 300 mg via ORAL
  Filled 2019-01-04 (×6): qty 1

## 2019-01-04 MED ORDER — ENOXAPARIN SODIUM 40 MG/0.4ML ~~LOC~~ SOLN
40.0000 mg | SUBCUTANEOUS | Status: DC
  Administered 2019-01-04: 40 mg via SUBCUTANEOUS
  Filled 2019-01-04: qty 0.4

## 2019-01-04 MED ORDER — ALBUTEROL SULFATE HFA 108 (90 BASE) MCG/ACT IN AERS: 2.0000 | INHALATION_SPRAY | Freq: Four times a day (QID) | RESPIRATORY_TRACT | Status: DC | PRN

## 2019-01-04 MED ORDER — ENOXAPARIN SODIUM 120 MG/0.8ML ~~LOC~~ SOLN
110.0000 mg | SUBCUTANEOUS | Status: DC
  Filled 2019-01-04: qty 0.73

## 2019-01-04 MED ORDER — METOPROLOL TARTRATE 25 MG PO TABS
25.0000 mg | ORAL_TABLET | Freq: Two times a day (BID) | ORAL | Status: DC
  Administered 2019-01-04 – 2019-01-07 (×6): 25 mg via ORAL
  Filled 2019-01-04 (×7): qty 1

## 2019-01-04 MED ORDER — OXYCODONE-ACETAMINOPHEN 5-325 MG PO TABS
1.0000 | ORAL_TABLET | Freq: Four times a day (QID) | ORAL | Status: DC | PRN
  Administered 2019-01-04: 2 via ORAL
  Administered 2019-01-04: 1 via ORAL
  Administered 2019-01-05 – 2019-01-07 (×5): 2 via ORAL
  Filled 2019-01-04 (×7): qty 2

## 2019-01-04 MED ORDER — PANTOPRAZOLE SODIUM 40 MG PO TBEC
40.0000 mg | DELAYED_RELEASE_TABLET | Freq: Every day | ORAL | Status: DC
  Administered 2019-01-04 – 2019-01-07 (×4): 40 mg via ORAL
  Filled 2019-01-04 (×4): qty 1

## 2019-01-04 MED ORDER — OXYCODONE-ACETAMINOPHEN 5-325 MG PO TABS
1.0000 | ORAL_TABLET | Freq: Three times a day (TID) | ORAL | Status: DC | PRN
  Administered 2019-01-04: 1 via ORAL
  Filled 2019-01-04: qty 1

## 2019-01-04 MED ORDER — ASPIRIN EC 81 MG PO TBEC
81.0000 mg | DELAYED_RELEASE_TABLET | Freq: Every day | ORAL | Status: DC
  Administered 2019-01-06 – 2019-01-07 (×2): 81 mg via ORAL
  Filled 2019-01-04 (×4): qty 1

## 2019-01-04 MED ORDER — SODIUM CHLORIDE 0.9% FLUSH: 3.0000 mL | INTRAVENOUS | Status: DC | PRN

## 2019-01-04 MED ORDER — MORPHINE SULFATE (PF) 4 MG/ML IV SOLN
4.0000 mg | Freq: Once | INTRAVENOUS | Status: AC
  Administered 2019-01-04: 4 mg via INTRAVENOUS
  Filled 2019-01-04: qty 1

## 2019-01-04 MED ORDER — INSULIN ASPART 100 UNIT/ML ~~LOC~~ SOLN: 0.0000 [IU] | Freq: Every day | SUBCUTANEOUS | Status: DC

## 2019-01-04 MED ORDER — LIDOCAINE HCL (PF) 1 % IJ SOLN
INTRAMUSCULAR | Status: AC
  Filled 2019-01-04: qty 30

## 2019-01-04 MED ORDER — IPRATROPIUM-ALBUTEROL 0.5-2.5 (3) MG/3ML IN SOLN
3.0000 mL | Freq: Every day | RESPIRATORY_TRACT | Status: DC | PRN
  Administered 2019-01-06: 3 mL via RESPIRATORY_TRACT
  Filled 2019-01-04: qty 3

## 2019-01-04 MED ORDER — ALBUTEROL SULFATE (2.5 MG/3ML) 0.083% IN NEBU
2.5000 mg | INHALATION_SOLUTION | Freq: Four times a day (QID) | RESPIRATORY_TRACT | Status: DC | PRN
  Administered 2019-01-04 – 2019-01-07 (×6): 2.5 mg via RESPIRATORY_TRACT
  Filled 2019-01-04 (×6): qty 3

## 2019-01-04 NOTE — ED Notes (Signed)
Admitting Provider at bedside. 

## 2019-01-04 NOTE — Progress Notes (Signed)
Patient seen and examined admitted earlier this morning by Dr. Jonelle Sidle, please see his H&P for details -This is a 63 year old African-American male with history of chronic diastolic CHF, CKD stage III, who just underwent bioprosthetic aortic valve replacement on 12/16/2018, subsequently discharged home on diuretics. -Readmitted on 8/2 with dyspnea on exertion this was felt to be secondary to worsening anemia and right pleural effusion, he had 3 units of PRBC transfused and a thoracentesis, subsequently discharged home on Lasix for 5 days on 8/5. -Presented back to the ER last night 8/8 with worsening dyspnea on exertion, hypoxia  1.  Acute on chronic hypoxic respiratory failure -His hemoglobin is stable from recent admission, chest x-ray without significant change, notes right small effusion,  ordered to have ultrasound thoracentesis today per admitting MD due to degree of symptoms and hypoxia -Will order pleural fluid studies -Also check VQ scan and Dopplers to rule out PE Clinically does not appear overtly volume overloaded -Echo done last week post aVR was unremarkable  2.  Recent AVR -Appears stable based on last echo 8/3, hemoglobin stable, no evidence of hemolysis bilirubin is 0.8 -Will notify CVTS of admission on Monday  3.  Subacute anemia -Postop, following AVR -Check anemia panel -No hemolysis noted -Will monitor  4.  Chronic systolic and diastolic CHF -Does not appear overtly volume overloaded to me -Hold Lasix today given worsening creatinine  5.  AKI on CKD 3 -Creatinine slightly higher than baseline -Monitor with diuresis, urine output is good  OSA -CPAP nightly  Charles Polite, MD

## 2019-01-04 NOTE — H&P (Signed)
History and Physical   Charles Riggs. [id number] DOB: Dec 05, 1955 DOA: 01/04/2019  Referring MD/NP/PA: Dr. Kathrynn Humble  [facility]   Outpatient Specialists: Dr. Roxy Manns  Patient coming from: Home  Chief Complaint: Shortness of breath  HPI: Charles Riggs. is a 63 y.o. male with medical history significant of diastolic dysfunction CHF, chronic kidney disease, recurrent pleural effusion with recent thoracentesis removing 1.7 L of fluid, severe aortic insufficiency status post bioprosthetic valve replacement on December 17, 2018, hypertension, obstructive sleep apnea, chronic kidney disease stage III, anemia of acute blood loss who presented to the ER with 3 days of progressive shortness of breath and cough.  Shortness of breath is with exertion.  Now it is even at rest.  He cannot do any activities without feeling suffocated.  EMS was called.  Patient was found to be hypoxic with oxygen sats in the 70s.  He was placed on 15 L nonrebreather bag and brought to the ER.  He feels comfortable now on oxygen.  His titrated down to 3 L at the moment.  Patient however continue to feel tightness.  No chest pain.  He has some cough.  Chest x-ray showed pleural effusion also.  He is on diuretics at home.  He is being admitted with acute CHF exacerbation..  ED Course: Temperature is 98.5 blood pressure 133/81 pulse 120 respirate of 31 oxygen sat 93% room air.  His white count is 10.8 hemoglobin 8.6 and platelets 591.  Sodium 142 potassium 4.4 chloride 105 CO2 23 with glucose 146 BUN 25 creatinine of 2.28.  LFTs reasonably within normal BNP of 152.  Chest x-ray showed right-sided pleural effusion and associated compressive atelectasis in the right lung base.  COVID-19 screen is negative.  Patient has received a dose of Lasix and is being admitted for treatment.  Review of Systems: As per HPI otherwise 10 point review of systems negative.    Past Medical History:  Diagnosis Date  . Acute on  chronic diastolic heart failure ([place])   . Anemia in chronic kidney disease   . Anxiety   . Aortic insufficiency   . Arthritis   . CHF (congestive heart failure) ([place])   . CKD (chronic kidney disease)   . Essential hypertension   . Nerve pain   . Non-rheumatic aortic regurgitation   . Obstructive sleep apnea    cpap  . Quadricuspid aortic valve   . S/P minimally invasive aortic valve replacement with bioprosthetic valve 12/16/2018   25 mm Edwards Inspiris Resilia stented bovine pericardial tissue valve via right mini thoracotomy approach  . Severe aortic insufficiency   . SOB (shortness of breath)   . Syncope 12/2018    Past Surgical History:  Procedure Laterality Date  . AORTIC VALVE REPLACEMENT N/A 12/16/2018   Procedure: MINIMALLY INVASIVE AORTIC VALVE REPLACEMENT (AVR) using Inspiris Aortic valve 14mm.;  Surgeon: Rexene Alberts, MD;  Location: [facility];  Service: Open Heart Surgery;  Laterality: N/A;  . HERNIA REPAIR    . IR THORACENTESIS ASP PLEURAL SPACE W/IMG GUIDE  12/29/2018  . RIB PLATING  12/16/2018   Procedure: Rib Plating;  Surgeon: Rexene Alberts, MD;  Location: [facility] OR;  Service: Open Heart Surgery;;  . RIGHT/LEFT HEART CATH AND CORONARY ANGIOGRAPHY N/A 10/14/2018   Procedure: RIGHT/LEFT HEART CATH AND CORONARY ANGIOGRAPHY;  Surgeon: Nigel Mormon, MD;  Location: [facility] LAB;  Service: Cardiovascular;  Laterality: N/A;  . TEE WITHOUT CARDIOVERSION N/A 11/11/2018   Procedure:  TRANSESOPHAGEAL ECHOCARDIOGRAM (TEE);  Surgeon: Nigel Mormon, MD;  Location: [facility] ENDOSCOPY;  Service: Cardiovascular;  Laterality: N/A;  . TEE WITHOUT CARDIOVERSION N/A 12/16/2018   Procedure: TRANSESOPHAGEAL ECHOCARDIOGRAM (TEE);  Surgeon: Rexene Alberts, MD;  Location: [facility];  Service: Open Heart Surgery;  Laterality: N/A;     reports that he has quit smoking. His smoking use included cigarettes. He has never used smokeless tobacco. He reports that he does not drink alcohol or use  drugs.  No Known Allergies  Family History  Problem Relation Age of Onset  . COPD Father   . Cancer Brother      Prior to Admission medications   Medication Sig Start Date End Date Taking? Authorizing Provider  acetaminophen (TYLENOL) 500 MG tablet Take 500 mg by mouth 3 (three) times daily.   Yes [provider]  albuterol (PROVENTIL HFA;VENTOLIN HFA) 108 (90 Base) MCG/ACT inhaler Inhale 2 puffs into the lungs every 6 (six) hours as needed for wheezing or shortness of breath.   Yes [provider]  albuterol (PROVENTIL) (2.5 MG/3ML) 0.083% nebulizer solution Take 3 mLs (2.5 mg total) by nebulization every 6 (six) hours as needed for wheezing or shortness of breath. 11/14/17  Yes Noemi Chapel, MD  aspirin EC 81 MG tablet Take 81 mg by mouth daily.   Yes [provider]  budesonide-formoterol (SYMBICORT) 160-4.5 MCG/ACT inhaler Inhale 2 puffs into the lungs 2 (two) times daily. 12/29/18  Yes Rexene Alberts, MD  COMBIVENT RESPIMAT 20-100 MCG/ACT AERS respimat Inhale 2 puffs into the lungs daily as needed for shortness of breath. 12/10/16  Yes Law, Bea Graff, PA-C  dextromethorphan-guaiFENesin (MUCINEX DM) 30-600 MG 12hr tablet Take 1 tablet by mouth 2 (two) times daily.   Yes [provider]  ferrous [id number] C-folic acid (TRINSICON / FOLTRIN) capsule Take 1 capsule by mouth 3 (three) times daily after meals. 12/31/18 01/30/19 Yes Barrett, Erin R, PA-C  fluticasone (FLONASE) 50 MCG/ACT nasal spray Place 2 sprays into both nostrils daily as needed for allergies or rhinitis.   Yes [provider]  furosemide (LASIX) 40 MG tablet Take 1 tablet (40 mg total) by mouth daily for 5 days. 12/31/18 01/05/19 Yes Barrett, Erin R, PA-C  gabapentin (NEURONTIN) 400 MG capsule Take 400 mg by mouth every 8 (eight) hours.   Yes [provider]  hydrOXYzine (VISTARIL) 25 MG capsule Take 25 mg by mouth 3 (three) times daily.   Yes [provider]  metoprolol tartrate (LOPRESSOR) 25 MG tablet Take 1 tablet (25 mg total) by mouth 2 (two) times daily. 12/20/18 12/20/19 Yes Roddenberry, Myron G, PA-C  montelukast (SINGULAIR) 10 MG tablet Take 10 mg by mouth every evening.   Yes [provider]  omeprazole (PRILOSEC) 40 MG capsule TAKE ONE CAPSULE BY MOUTH DAILY Patient taking differently: Take 40 mg by mouth daily.  12/01/18  Yes [name], Manish J, MD  oxyCODONE-acetaminophen (PERCOCET/ROXICET) 5-325 MG tablet Take 1 tablet by mouth every 8 (eight) hours as needed for moderate pain or severe pain. 12/31/18  Yes Barrett, Erin R, PA-C  rosuvastatin (CRESTOR) 10 MG tablet Take 1 tablet (10 mg total) by mouth daily at 6 PM. 08/28/18  Yes Nigel Mormon, MD    Physical Exam: Vitals:   01/04/19 0230 01/04/19 0330 01/04/19 0400 01/04/19 0430  BP: 113/67 120/87 (!) 125/95 133/81  Pulse: (!) 117 (!) 115 (!) 114 (!) 117  Resp: (!) 23 (!) 26 (!) 31 (!) 23  Temp:  TempSrc:      SpO2: 94% 95% 93% 94%      Constitutional: Anxious, mild respiratory distress Vitals:   01/04/19 0230 01/04/19 0330 01/04/19 0400 01/04/19 0430  BP: 113/67 120/87 (!) 125/95 133/81  Pulse: (!) 117 (!) 115 (!) 114 (!) 117  Resp: (!) 23 (!) 26 (!) 31 (!) 23  Temp:      TempSrc:      SpO2: 94% 95% 93% 94%   Eyes: PERRL, lids and conjunctivae normal ENMT: Mucous membranes are moist. Posterior pharynx clear of any exudate or lesions.Normal dentition.  Neck: normal, supple, no masses, no thyromegaly Respiratory: Decreased air entry right more than left, marked crackles, no significant wheezing, no rhonchi. No accessory muscle use.  Cardiovascular: Tachycardic, no murmurs / rubs / gallops. No extremity edema. 2+ pedal pulses. No carotid bruits.  Abdomen: no tenderness, no masses palpated. No hepatosplenomegaly. Bowel sounds positive.  Musculoskeletal: no clubbing / cyanosis. No joint deformity upper and lower extremities. Good ROM, no contractures.  Normal muscle tone.  Skin: no rashes, lesions, ulcers. No induration Neurologic: CN 2-12 grossly intact. Sensation intact, DTR normal. Strength 5/5 in all 4.  Psychiatric: Normal judgment and insight. Alert and oriented x 3. Normal mood.     Labs on Admission: I have personally reviewed following labs and imaging studies  CBC: Recent Labs  Lab 12/28/18 1618 12/29/18 0351 12/29/18 1133 12/30/18 0547 12/31/18 0451 01/04/19 0053  WBC 11.9* 12.1*  --  11.1* 9.2 10.8*  NEUTROABS 9.7* 9.6*  --   --   --  7.7  HGB 6.6* 7.9* 8.9* 8.0* 7.9* 8.6*  HCT 22.7* 25.7* 27.9* 25.1* 25.1* 28.4*  MCV 79.9* 79.1*  --  80.2 81.2 81.6  PLT 406* 404*  --  414* 458* 123456*   Basic Metabolic Panel: Recent Labs  Lab 12/28/18 1618 12/29/18 0351 12/30/18 0547 01/04/19 0053  NA 137 138 135 142  K 4.7 4.9 3.8 4.4  CL 105 104 99 105  CO2 23 22 23 23   GLUCOSE 125* 119* 134* 146*  BUN 19 19 16  25*  CREATININE 1.77* 1.81* 1.59* 2.28*  CALCIUM 8.6* 8.6* 8.5* 9.0   GFR: Estimated Creatinine Clearance: 42.2 mL/min (A) (by C-G formula based on SCr of 2.28 mg/dL (H)). Liver Function Tests: Recent Labs  Lab 12/29/18 0351 01/04/19 0053  AST 14* 16  ALT 14 14  ALKPHOS 50 59  BILITOT 1.2 0.8  PROT 6.2* 6.8  ALBUMIN 2.9* 2.9*   No results for input(s): LIPASE, AMYLASE in the last 168 hours. No results for input(s): AMMONIA in the last 168 hours. Coagulation Profile: Recent Labs  Lab 12/29/18 0351  INR 1.3*   Cardiac Enzymes: No results for input(s): CKTOTAL, CKMB, CKMBINDEX, TROPONINI in the last 168 hours. BNP (last 3 results) No results for input(s): PROBNP in the last 8760 hours. HbA1C: No results for input(s): HGBA1C in the last 72 hours. CBG: No results for input(s): GLUCAP in the last 168 hours. Lipid Profile: No results for input(s): CHOL, HDL, LDLCALC, TRIG, CHOLHDL, LDLDIRECT in the last 72 hours. Thyroid Function Tests: No results for input(s): TSH, T4TOTAL, FREET4, T3FREE,  THYROIDAB in the last 72 hours. Anemia Panel: No results for input(s): VITAMINB12, FOLATE, FERRITIN, TIBC, IRON, RETICCTPCT in the last 72 hours. Urine analysis:    Component Value Date/Time   COLORURINE YELLOW 12/11/2018 1130   APPEARANCEUR CLEAR 12/11/2018 1130   LABSPEC 1.030 12/11/2018 1130   PHURINE 5.0 12/11/2018 1130   GLUCOSEU NEGATIVE  12/11/2018 1130   HGBUR NEGATIVE 12/11/2018 1130   HGBUR large 01/05/2009 1309   BILIRUBINUR MODERATE (A) 12/11/2018 1130   KETONESUR NEGATIVE 12/11/2018 1130   PROTEINUR NEGATIVE 12/11/2018 1130   UROBILINOGEN 0.2 01/05/2009 1309   NITRITE NEGATIVE 12/11/2018 1130   LEUKOCYTESUR NEGATIVE 12/11/2018 1130   Sepsis Labs: @LABRCNTIP (procalcitonin:4,lacticidven:4) ) Recent Results (from the past 240 hour(s))  SARS CORONAVIRUS 2 Nasal Swab Aptima Multi Swab     Status: None   Collection Time: 12/28/18  4:39 PM   Specimen: Aptima Multi Swab; Nasal Swab  Result Value Ref Range Status   SARS Coronavirus 2 NEGATIVE NEGATIVE Final    Comment: (NOTE) SARS-CoV-2 target nucleic acids are NOT DETECTED. The SARS-CoV-2 RNA is generally detectable in upper and lower respiratory specimens during the acute phase of infection. Negative results do not preclude SARS-CoV-2 infection, do not rule out co-infections with other pathogens, and should not be used as the sole basis for treatment or other patient management decisions. Negative results must be combined with clinical observations, patient history, and epidemiological information. The expected result is Negative. Fact Sheet for Patients: SugarRoll.be Fact Sheet for Healthcare Providers: https://www.woods-mathews.com/ This test is not yet approved or cleared by the [place] FDA and  has been authorized for detection and/or diagnosis of SARS-CoV-2 by FDA under an Emergency Use Authorization (EUA). This EUA will remain  in effect (meaning this test can be  used) for the duration of the COVID-19 declaration under Section 56 4(b)(1) of the Act, 21 U.S.C. section 360bbb-3(b)(1), unless the authorization is terminated or revoked sooner. Performed at [facility], [place] [address], [place], [place] [address]   Gram stain     Status: None   Collection Time: 12/29/18 10:23 AM   Specimen: Pleural, Right; Pleural Fluid  Result Value Ref Range Status   Specimen Description PLEURAL RIGHT  Final   Special Requests NONE  Final   Gram Stain   Final    RARE WBC PRESENT,BOTH PMN AND MONONUCLEAR NO ORGANISMS SEEN Performed at [facility], [place] [address], [place], [place] [address]    Report Status 12/29/2018 FINAL  Final  Culture, body fluid-bottle     Status: None   Collection Time: 12/29/18 10:23 AM   Specimen: Pleura  Result Value Ref Range Status   Specimen Description PLEURAL RIGHT  Final   Special Requests NONE  Final   Culture   Final    NO GROWTH 5 DAYS Performed at [place] [address], [place], [place] [address]    Report Status 01/03/2019 FINAL  Final  SARS Coronavirus 2 [facility] order, Performed in [facility] hospital lab) Nasopharyngeal Nasopharyngeal Swab     Status: None   Collection Time: 01/04/19  1:50 AM   Specimen: Nasopharyngeal Swab  Result Value Ref Range Status   SARS Coronavirus 2 NEGATIVE NEGATIVE Final    Comment: (NOTE) If result is NEGATIVE SARS-CoV-2 target nucleic acids are NOT DETECTED. The SARS-CoV-2 RNA is generally detectable in upper and lower  respiratory specimens during the acute phase of infection. The lowest  concentration of SARS-CoV-2 viral copies this assay can detect is 250  copies / mL. A negative result does not preclude SARS-CoV-2 infection  and should not be used as the sole basis for treatment or other  patient management decisions.  A negative result may occur with  improper specimen collection / handling, submission of specimen other  than nasopharyngeal swab,  presence of viral mutation(s)  within the  areas targeted by this assay, and inadequate number of viral copies  (<250 copies / mL). A negative result must be combined with clinical  observations, patient history, and epidemiological information. If result is POSITIVE SARS-CoV-2 target nucleic acids are DETECTED. The SARS-CoV-2 RNA is generally detectable in upper and lower  respiratory specimens dur ing the acute phase of infection.  Positive  results are indicative of active infection with SARS-CoV-2.  Clinical  correlation with patient history and other diagnostic information is  necessary to determine patient infection status.  Positive results do  not rule out bacterial infection or co-infection with other viruses. If result is PRESUMPTIVE POSTIVE SARS-CoV-2 nucleic acids MAY BE PRESENT.   A presumptive positive result was obtained on the submitted specimen  and confirmed on repeat testing.  While 2019 novel coronavirus  (SARS-CoV-2) nucleic acids may be present in the submitted sample  additional confirmatory testing may be necessary for epidemiological  and / or clinical management purposes  to differentiate between  SARS-CoV-2 and other Sarbecovirus currently known to infect humans.  If clinically indicated additional testing with an alternate test  methodology [phone]) is advised. The SARS-CoV-2 RNA is generally  detectable in upper and lower respiratory sp ecimens during the acute  phase of infection. The expected result is Negative. Fact Sheet for Patients:  StrictlyIdeas.no Fact Sheet for Healthcare Providers: BankingDealers.co.za This test is not yet approved or cleared by the [place] FDA and has been authorized for detection and/or diagnosis of SARS-CoV-2 by FDA under an Emergency Use Authorization (EUA).  This EUA will remain in effect (meaning this test can be used) for the duration of the COVID-19 declaration under  Section 564(b)(1) of the Act, 21 U.S.C. section 360bbb-3(b)(1), unless the authorization is terminated or revoked sooner. Performed at [facility], [place] [address], [place], [place] [address]      Radiological Exams on Admission: Dg Chest Port 1 View  Result Date: 01/04/2019 CLINICAL DATA:  63 year old male with recent bowel with placement presenting with shortness of breath. EXAM: PORTABLE CHEST 1 VIEW COMPARISON:  Chest radiograph dated 12/31/2018 and CT dated 12/28/2018 FINDINGS: Right-sided pleural effusion similar to prior radiograph with associated compressive atelectasis of the right lung base. Overall no significant interval change in the appearance of the lungs. The left lung is clear. No pneumothorax. Stable cardiac silhouette. Mechanical aortic bowel and sternal fixation plate. No acute osseous pathology. IMPRESSION: No significant interval change in the right-sided pleural effusion and associated compressive atelectasis of the right lung base. Electronically Signed   By: Anner Crete M.D.   On: 01/04/2019 01:06    EKG: Independently reviewed.  It shows sinus tachycardia with a rate of 121, evidence of LVH.  ST elevation mild QT elongation but appears to be unchanged from previous EKG.  Assessment/Plan Principal Problem:   Acute respiratory failure with hypoxemia (HCC) Active Problems:   Asthma   GERD   Personal history presenting hazards to health   Nonrheumatic aortic valve insufficiency   Essential hypertension   Obstructive sleep apnea   Chronic kidney disease (CKD)     #1 acute respiratory failure with hypoxemia: Most likely secondary to fluid overload especially right-sided pleural effusion.  Patient has had recent thoracentesis which was bloody.  He might require repeat thoracentesis if diuretics do not help.  He has also risk factors for PE.  Cannot do a CTA due to patient's creatinine.  We will check a VQ scan while empirically start him  on Lovenox.   Continue oxygenation and diuretics.  Patient's recent echocardiogram showed mildly depressed EF of 40 to 45%.  #2 recent aortic valve replacement: Patient has an appointment with Dr. ON this Monday.  We will consult cardiovascular surgery in the morning.  #3 obstructive sleep apnea: CPAP at night  #4 chronic kidney disease stage III: Appears to be close to baseline.  Continue treatment.  #5 history of asthma: Patient does not appear to be having asthma exacerbation.  Continue with empiric inhalers.  #6 GERD: Continue with PPIs   DVT prophylaxis: Lovenox Code Status: Full code Family Communication: Wife over the phone Disposition Plan: Home Consults called: CVTS to be consulted in the morning Admission status: Inpatient  Severity of Illness: The appropriate patient status for this patient is INPATIENT. Inpatient status is judged to be reasonable and necessary in order to provide the required intensity of service to ensure the patient's safety. The patient's presenting symptoms, physical exam findings, and initial radiographic and laboratory data in the context of their chronic comorbidities is felt to place them at high risk for further clinical deterioration. Furthermore, it is not anticipated that the patient will be medically stable for discharge from the hospital within 2 midnights of admission. The following factors support the patient status of inpatient.   " The patient's presenting symptoms include exertional dyspnea. " The worrisome physical exam findings include decreased air entry on the right side lung. " The initial radiographic and laboratory data are worrisome because of significant right-sided pleural effusion. " The chronic co-morbidities include congestive heart failure.   * I certify that at the point of admission it is my clinical judgment that the patient will require inpatient hospital care spanning beyond 2 midnights from the point of admission due to high intensity  of service, high risk for further deterioration and high frequency of surveillance required.Barbette Merino MD [facility] Pager [phone]  If 7PM-7AM, please contact night-coverage [email or web address] Password [email or web address]  01/04/2019, 4:42 AM

## 2019-01-04 NOTE — ED Notes (Signed)
ED TO INPATIENT HANDOFF REPORT  ED Nurse Name and Phone #: [phone]  S Name/Age/Gender Charles Riggs. 63 y.o. male Room/Bed: 016C/016C  Code Status   Code Status: Prior  Home/SNF/Other Home Patient oriented to: self, place, time and situation Is this baseline? Yes   Triage Complete: Triage complete  Chief Complaint SOB   Triage Note Per EMS, pt from home, SOBX2 days , worse today with ambulation.  RA 88%, placed on 15L non-rebreather.  Used albuterol inhaler X3 at home.  Recently pt has has AV replacement on 7/21 and 1.9 L of fluid "taken off his lungs on8/3. Per EMS defused wheezing in all lobes   Allergies No Known Allergies  Level of Care/Admitting Diagnosis ED Disposition    ED Disposition Condition Comment   Admit  Hospital Area: [facility] [[id number]]  Level of Care: Progressive [102]  Covid Evaluation: Asymptomatic Screening Protocol (No Symptoms)  Diagnosis: Acute respiratory failure with hypoxemia [id number]  Admitting Physician: Elwyn Reach [[id number]]  Attending Physician: Elwyn Reach [[id number]]  Estimated length of stay: past midnight tomorrow  Certification:: I certify this patient will need inpatient services for at least 2 midnights  PT Class (Do Not Modify): Inpatient [101]  PT Acc Code (Do Not Modify): Private [1]       B Medical/Surgery History Past Medical History:  Diagnosis Date  . Acute on chronic diastolic heart failure ([place])   . Anemia in chronic kidney disease   . Anxiety   . Aortic insufficiency   . Arthritis   . CHF (congestive heart failure) ([place])   . CKD (chronic kidney disease)   . Essential hypertension   . Nerve pain   . Non-rheumatic aortic regurgitation   . Obstructive sleep apnea    cpap  . Quadricuspid aortic valve   . S/P minimally invasive aortic valve replacement with bioprosthetic valve 12/16/2018   25 mm Edwards Inspiris Resilia stented bovine pericardial tissue valve via right mini  thoracotomy approach  . Severe aortic insufficiency   . SOB (shortness of breath)   . Syncope 12/2018   Past Surgical History:  Procedure Laterality Date  . AORTIC VALVE REPLACEMENT N/A 12/16/2018   Procedure: MINIMALLY INVASIVE AORTIC VALVE REPLACEMENT (AVR) using Inspiris Aortic valve 5mm.;  Surgeon: Rexene Alberts, MD;  Location: [facility];  Service: Open Heart Surgery;  Laterality: N/A;  . HERNIA REPAIR    . IR THORACENTESIS ASP PLEURAL SPACE W/IMG GUIDE  12/29/2018  . RIB PLATING  12/16/2018   Procedure: Rib Plating;  Surgeon: Rexene Alberts, MD;  Location: [facility] OR;  Service: Open Heart Surgery;;  . RIGHT/LEFT HEART CATH AND CORONARY ANGIOGRAPHY N/A 10/14/2018   Procedure: RIGHT/LEFT HEART CATH AND CORONARY ANGIOGRAPHY;  Surgeon: Nigel Mormon, MD;  Location: [facility];  Service: Cardiovascular;  Laterality: N/A;  . TEE WITHOUT CARDIOVERSION N/A 11/11/2018   Procedure: TRANSESOPHAGEAL ECHOCARDIOGRAM (TEE);  Surgeon: Nigel Mormon, MD;  Location: [facility];  Service: Cardiovascular;  Laterality: N/A;  . TEE WITHOUT CARDIOVERSION N/A 12/16/2018   Procedure: TRANSESOPHAGEAL ECHOCARDIOGRAM (TEE);  Surgeon: Rexene Alberts, MD;  Location: [facility];  Service: Open Heart Surgery;  Laterality: N/A;     A IV Location/Drains/Wounds Patient Lines/Drains/Airways Status   Active Line/Drains/Airways    Name:   Placement date:   Placement time:   Site:   Days:   Peripheral IV 12/28/18 Left Antecubital   12/28/18    1600    Antecubital  7   Incision (Closed) 12/16/18 Chest Right   12/16/18    0918     19          Intake/Output Last 24 hours No intake or output data in the 24 hours ending 01/04/19 0457  Labs/Imaging Results for orders placed or performed during the hospital encounter of 01/04/19 (from the past 48 hour(s))  Comprehensive metabolic panel     Status: Abnormal   Collection Time: 01/04/19 12:53 AM  Result Value Ref Range   Sodium 142 135 - 145 mmol/L    Potassium 4.4 3.5 - 5.1 mmol/L   Chloride 105 98 - 111 mmol/L   CO2 23 22 - 32 mmol/L   Glucose, Bld 146 (H) 70 - 99 mg/dL   BUN 25 (H) 8 - 23 mg/dL   Creatinine, Ser 2.28 (H) 0.61 - 1.24 mg/dL   Calcium 9.0 8.9 - 10.3 mg/dL   Total Protein 6.8 6.5 - 8.1 g/dL   Albumin 2.9 (L) 3.5 - 5.0 g/dL   AST 16 15 - 41 U/L   ALT 14 0 - 44 U/L   Alkaline Phosphatase 59 38 - 126 U/L   Total Bilirubin 0.8 0.3 - 1.2 mg/dL   GFR calc non Af Amer 30 (L) >60 mL/min   GFR calc Af Amer 35 (L) >60 mL/min   Anion gap 14 5 - 15    Comment: Performed at [facility], [address] [place], [place] [address]  CBC WITH DIFFERENTIAL     Status: Abnormal   Collection Time: 01/04/19 12:53 AM  Result Value Ref Range   WBC 10.8 (H) 4.0 - 10.5 K/uL   RBC 3.48 (L) 4.22 - 5.81 MIL/uL   Hemoglobin 8.6 (L) 13.0 - 17.0 g/dL   HCT 28.4 (L) 39.0 - 52.0 %   MCV 81.6 80.0 - 100.0 fL   MCH 24.7 (L) 26.0 - 34.0 pg   MCHC 30.3 30.0 - 36.0 g/dL   RDW 16.4 (H) 11.5 - 15.5 %   Platelets 591 (H) 150 - 400 K/uL   nRBC 0.0 0.0 - 0.2 %   Neutrophils Relative % 72 %   Neutro Abs 7.7 1.7 - 7.7 K/uL   Lymphocytes Relative 12 %   Lymphs Abs 1.3 0.7 - 4.0 K/uL   Monocytes Relative 10 %   Monocytes Absolute 1.1 (H) 0.1 - 1.0 K/uL   Eosinophils Relative 5 %   Eosinophils Absolute 0.6 (H) 0.0 - 0.5 K/uL   Basophils Relative 0 %   Basophils Absolute 0.0 0.0 - 0.1 K/uL   Immature Granulocytes 1 %   Abs Immature Granulocytes 0.07 0.00 - 0.07 K/uL    Comment: Performed at [facility], [address], [place], [place] [address]  Brain natriuretic peptide     Status: Abnormal   Collection Time: 01/04/19 12:53 AM  Result Value Ref Range   B Natriuretic Peptide 152.8 (H) 0.0 - 100.0 pg/mL    Comment: Performed at [facility] [address] [place],  [address]  SARS Coronavirus 2 [facility] order, Performed in [facility] hospital lab) Nasopharyngeal Nasopharyngeal Swab     Status: None   Collection Time:  01/04/19  1:50 AM   Specimen: Nasopharyngeal Swab  Result Value Ref Range   SARS Coronavirus 2 NEGATIVE NEGATIVE    Comment: (NOTE) If result is NEGATIVE SARS-CoV-2 target nucleic acids are NOT DETECTED. The SARS-CoV-2 RNA is generally detectable in upper and lower  respiratory specimens  during the acute phase of infection. The lowest  concentration of SARS-CoV-2 viral copies this assay can detect is 250  copies / mL. A negative result does not preclude SARS-CoV-2 infection  and should not be used as the sole basis for treatment or other  patient management decisions.  A negative result may occur with  improper specimen collection / handling, submission of specimen other  than nasopharyngeal swab, presence of viral mutation(s) within the  areas targeted by this assay, and inadequate number of viral copies  (<250 copies / mL). A negative result must be combined with clinical  observations, patient history, and epidemiological information. If result is POSITIVE SARS-CoV-2 target nucleic acids are DETECTED. The SARS-CoV-2 RNA is generally detectable in upper and lower  respiratory specimens dur ing the acute phase of infection.  Positive  results are indicative of active infection with SARS-CoV-2.  Clinical  correlation with patient history and other diagnostic information is  necessary to determine patient infection status.  Positive results do  not rule out bacterial infection or co-infection with other viruses. If result is PRESUMPTIVE POSTIVE SARS-CoV-2 nucleic acids MAY BE PRESENT.   A presumptive positive result was obtained on the submitted specimen  and confirmed on repeat testing.  While 2019 novel coronavirus  (SARS-CoV-2) nucleic acids may be present in the submitted sample  additional confirmatory testing may be necessary for epidemiological  and / or clinical management purposes  to differentiate between  SARS-CoV-2 and other Sarbecovirus currently known to infect humans.   If clinically indicated additional testing with an alternate test  methodology [phone]) is advised. The SARS-CoV-2 RNA is generally  detectable in upper and lower respiratory sp ecimens during the acute  phase of infection. The expected result is Negative. Fact Sheet for Patients:  StrictlyIdeas.no Fact Sheet for Healthcare Providers: BankingDealers.co.za This test is not yet approved or cleared by the [place] FDA and has been authorized for detection and/or diagnosis of SARS-CoV-2 by FDA under an Emergency Use Authorization (EUA).  This EUA will remain in effect (meaning this test can be used) for the duration of the COVID-19 declaration under Section 564(b)(1) of the Act, 21 U.S.C. section 360bbb-3(b)(1), unless the authorization is terminated or revoked sooner. Performed at [facility], [place] [address] [place], [place] [address]    Dg Chest Port 1 View  Result Date: 01/04/2019 CLINICAL DATA:  63 year old male with recent bowel with placement presenting with shortness of breath. EXAM: PORTABLE CHEST 1 VIEW COMPARISON:  Chest radiograph dated 12/31/2018 and CT dated 12/28/2018 FINDINGS: Right-sided pleural effusion similar to prior radiograph with associated compressive atelectasis of the right lung base. Overall no significant interval change in the appearance of the lungs. The left lung is clear. No pneumothorax. Stable cardiac silhouette. Mechanical aortic bowel and sternal fixation plate. No acute osseous pathology. IMPRESSION: No significant interval change in the right-sided pleural effusion and associated compressive atelectasis of the right lung base. Electronically Signed   By: Anner Crete M.D.   On: 01/04/2019 01:06    Pending Labs [employer] (From admission, onward)    Start     Ordered   Signed and Held  HIV antibody (Routine Testing)  Once,   R     Signed and Held   Signed and Held  Basic metabolic  panel  Daily,   R     Signed and Held   Signed and Held  CBC WITH DIFFERENTIAL  Tomorrow morning,   R  Signed and Held          Vitals/Pain Today's Vitals   01/04/19 0230 01/04/19 0330 01/04/19 0400 01/04/19 0430  BP: 113/67 120/87 (!) 125/95 133/81  Pulse: (!) 117 (!) 115 (!) 114 (!) 117  Resp: (!) 23 (!) 26 (!) 31 (!) 23  Temp:      TempSrc:      SpO2: 94% 95% 93% 94%  PainSc:        Isolation Precautions No active isolations  Medications Medications  enoxaparin (LOVENOX) injection 110 mg (has no administration in time range)  morphine 4 MG/ML injection 4 mg (4 mg Intravenous Given 01/04/19 0151)    Mobility walks Low fall risk   Focused Assessments Cardiac Assessment Handoff:  Cardiac Rhythm: Sinus tachycardia Lab Results  Component Value Date   TROPONINI 0.04 ([place]) 10/19/2018   No results found for: DDIMER Does the Patient currently have chest pain? Yes 9/10, did receive morphine earlier, pt says "its coming down". Dyspnea at rest and exertion. Pain worse with exertion.     R Recommendations: See Admitting Provider Note  Report given to:   Additional Notes:

## 2019-01-04 NOTE — ED Notes (Signed)
ED Provider at bedside. 

## 2019-01-04 NOTE — Progress Notes (Signed)
Lower extremity venous has been completed.   Preliminary results in CV Proc.   Abram S[place] 01/04/2019 9:31 AM

## 2019-01-04 NOTE — Progress Notes (Signed)
ANTICOAGULATION CONSULT NOTE - Initial Consult  Pharmacy Consult for lovenox Indication: DVT  No Known Allergies  Patient Measurements:   Heparin Dosing Weight: 106.2 kg  Vital Signs: Temp: 98.5 F (36.9 C) (08/09 0046) Temp Source: Oral (08/09 0046) BP: 133/81 (08/09 0430) Pulse Rate: 117 (08/09 0430)  Labs: Recent Labs    01/04/19 0053  HGB 8.6*  HCT 28.4*  PLT 591*  CREATININE 2.28*    Estimated Creatinine Clearance: 42.2 mL/min (A) (by C-G formula based on SCr of 2.28 mg/dL (H)).   Medical History: Past Medical History:  Diagnosis Date  . Acute on chronic diastolic heart failure ([place])   . Anemia in chronic kidney disease   . Anxiety   . Aortic insufficiency   . Arthritis   . CHF (congestive heart failure) ([place])   . CKD (chronic kidney disease)   . Essential hypertension   . Nerve pain   . Non-rheumatic aortic regurgitation   . Obstructive sleep apnea    cpap  . Quadricuspid aortic valve   . S/P minimally invasive aortic valve replacement with bioprosthetic valve 12/16/2018   25 mm Edwards Inspiris Resilia stented bovine pericardial tissue valve via right mini thoracotomy approach  . Severe aortic insufficiency   . SOB (shortness of breath)   . Syncope 12/2018    Medications:  See medication history  Assessment: 63 yo man to start lovenox for DVT.  He was not on anticoagulation PTA  Hg 8.6, PTLC 591 Goal of Therapy:  Anti-Xa level 0.6-1 units/ml 4hrs after LMWH dose given Monitor platelets by anticoagulation protocol: Yes   Plan:  Lovenox 110 mg sq q24 hours F/u renal function, CBC Monitor for bleeding complications  Eliyas Suddreth Poteet 01/04/2019,4:55 AM

## 2019-01-04 NOTE — ED Triage Notes (Signed)
Per EMS, pt from home, SOBX2 days , worse today with ambulation.  RA 88%, placed on 15L non-rebreather.  Used albuterol inhaler X3 at home.  Recently pt has has AV replacement on 7/21 and 1.9 L of fluid "taken off his lungs on8/3. Per EMS defused wheezing in all lobes

## 2019-01-04 NOTE — Procedures (Addendum)
PROCEDURE SUMMARY:  Successful image-guided right thoracentesis. Yielded 1.25 liters of dark red fluid. Patient tolerated procedure well. No immediate complications. EBL = 0 mL.  Specimen was sent for labs. CXR ordered.  Arieanna Pressey PA-C 01/04/2019 11:08 AM

## 2019-01-04 NOTE — ED Provider Notes (Addendum)
[place] [facility] EMERGENCY DEPARTMENT Provider Note   CSN: [id number] Arrival date & time: 01/04/19  0027    History   Chief Complaint Chief Complaint  Patient presents with  . Shortness of Breath    HPI Charles Dougall. is a 63 y.o. male.     HPI  63 year old male comes in a chief complaint of shortness of breath.  Patient has history of CHF, CKD, diastolic heart failure and a recent admission for pleural effusion that drained about 1.7 L of bloody fluid.  During the admission patient also required 3 units of PRBCs.  Patient also had aortic valve replacement done on July 21.  He reports that he woke up today feeling short of breath.  As the day went on his shortness of breath got worse, and prior to ED arrival he had acute respiratory distress for which he called EMS.  Patient was having wheezing per EMS and given albuterol inhaler and route.  He was also placed on 15 L nonrebreather O2 as he was found hypoxic.  Patient denies any chest pain.  He has a cough that is been present for several days now.  He denies any new leg swelling.  There is no history of PE, DVT.  Patient denies any fevers or chills.  He has no orthopnea or PND-like symptoms.  Patient reports that his symptoms are similar to his admission last week.  Past Medical History:  Diagnosis Date  . Acute on chronic diastolic heart failure ([place])   . Anemia in chronic kidney disease   . Anxiety   . Aortic insufficiency   . Arthritis   . CHF (congestive heart failure) ([place])   . CKD (chronic kidney disease)   . Essential hypertension   . Nerve pain   . Non-rheumatic aortic regurgitation   . Obstructive sleep apnea    cpap  . Quadricuspid aortic valve   . S/P minimally invasive aortic valve replacement with bioprosthetic valve 12/16/2018   25 mm Edwards Inspiris Resilia stented bovine pericardial tissue valve via right mini thoracotomy approach  . Severe aortic insufficiency   . SOB (shortness of  breath)   . Syncope 12/2018    Patient Active Problem List   Diagnosis Date Noted  . Acute respiratory failure with hypoxemia ([place]) 01/04/2019  . Pleural effusion on right 12/28/2018  . Syncope 12/28/2018  . Acute blood loss anemia   . S/P minimally invasive aortic valve replacement with bioprosthetic valve 12/16/2018  . Anemia in chronic kidney disease   . Chronic kidney disease (CKD)   . Elevated troponin 08/29/2018  . Severe aortic insufficiency   . Acute on chronic diastolic heart failure ([place])   . Nonrheumatic aortic valve insufficiency   . Essential hypertension   . Obstructive sleep apnea   . Quadricuspid aortic valve   . Chest pain in adult 08/27/2018  . LEG PAIN 04/26/2010  . DEPRESSION 08/19/2009  . RENAL CALCULUS 01/05/2009  . DM 10/12/2008  . COCAINE ABUSE, HX OF 06/16/2007  . ASTHMA 03/24/2007  . GERD 03/24/2007    Past Surgical History:  Procedure Laterality Date  . AORTIC VALVE REPLACEMENT N/A 12/16/2018   Procedure: MINIMALLY INVASIVE AORTIC VALVE REPLACEMENT (AVR) using Inspiris Aortic valve 69mm.;  Surgeon: Rexene Alberts, MD;  Location: [facility];  Service: Open Heart Surgery;  Laterality: N/A;  . HERNIA REPAIR    . IR THORACENTESIS ASP PLEURAL SPACE W/IMG GUIDE  12/29/2018  . RIB PLATING  12/16/2018  Procedure: Rib Plating;  Surgeon: Rexene Alberts, MD;  Location: [facility];  Service: Open Heart Surgery;;  . RIGHT/LEFT HEART CATH AND CORONARY ANGIOGRAPHY N/A 10/14/2018   Procedure: RIGHT/LEFT HEART CATH AND CORONARY ANGIOGRAPHY;  Surgeon: Nigel Mormon, MD;  Location: [facility];  Service: Cardiovascular;  Laterality: N/A;  . TEE WITHOUT CARDIOVERSION N/A 11/11/2018   Procedure: TRANSESOPHAGEAL ECHOCARDIOGRAM (TEE);  Surgeon: Nigel Mormon, MD;  Location: [facility] END[facility];  Service: Cardiovascular;  Laterality: N/A;  . TEE WITHOUT CARDIOVERSION N/A 12/16/2018   Procedure: TRANSESOPHAGEAL ECHOCARDIOGRAM (TEE);  Surgeon: Rexene Alberts, MD;   Location: [facility];  Service: Open Heart Surgery;  Laterality: N/A;        Home Medications    Prior to Admission medications   Medication Sig Start Date End Date Taking? Authorizing Provider  acetaminophen (TYLENOL) 500 MG tablet Take 500 mg by mouth 3 (three) times daily.   Yes [provider]  albuterol (PROVENTIL HFA;VENTOLIN HFA) 108 (90 Base) MCG/ACT inhaler Inhale 2 puffs into the lungs every 6 (six) hours as needed for wheezing or shortness of breath.   Yes [provider]  albuterol (PROVENTIL) (2.5 MG/3ML) 0.083% nebulizer solution Take 3 mLs (2.5 mg total) by nebulization every 6 (six) hours as needed for wheezing or shortness of breath. 11/14/17  Yes Noemi Chapel, MD  aspirin EC 81 MG tablet Take 81 mg by mouth daily.   Yes [provider]  budesonide-formoterol (SYMBICORT) 160-4.5 MCG/ACT inhaler Inhale 2 puffs into the lungs 2 (two) times daily. 12/29/18  Yes Rexene Alberts, MD  COMBIVENT RESPIMAT 20-100 MCG/ACT AERS respimat Inhale 2 puffs into the lungs daily as needed for shortness of breath. 12/10/16  Yes Law, Bea Graff, PA-C  dextromethorphan-guaiFENesin (MUCINEX DM) 30-600 MG 12hr tablet Take 1 tablet by mouth 2 (two) times daily.   Yes [provider]  ferrous [id number] C-folic acid (TRINSICON / FOLTRIN) capsule Take 1 capsule by mouth 3 (three) times daily after meals. 12/31/18 01/30/19 Yes Barrett, Erin R, PA-C  fluticasone (FLONASE) 50 MCG/ACT nasal spray Place 2 sprays into both nostrils daily as needed for allergies or rhinitis.   Yes [provider]  furosemide (LASIX) 40 MG tablet Take 1 tablet (40 mg total) by mouth daily for 5 days. 12/31/18 01/05/19 Yes Barrett, Erin R, PA-C  gabapentin (NEURONTIN) 400 MG capsule Take 400 mg by mouth every 8 (eight) hours.   Yes [provider]  hydrOXYzine (VISTARIL) 25 MG capsule Take 25 mg by mouth 3 (three) times daily.   Yes [provider]  metoprolol tartrate  (LOPRESSOR) 25 MG tablet Take 1 tablet (25 mg total) by mouth 2 (two) times daily. 12/20/18 12/20/19 Yes Roddenberry, Myron G, PA-C  montelukast (SINGULAIR) 10 MG tablet Take 10 mg by mouth every evening.   Yes [provider]  omeprazole (PRILOSEC) 40 MG capsule TAKE ONE CAPSULE BY MOUTH DAILY Patient taking differently: Take 40 mg by mouth daily.  12/01/18  Yes [name], Manish J, MD  oxyCODONE-acetaminophen (PERCOCET/ROXICET) 5-325 MG tablet Take 1 tablet by mouth every 8 (eight) hours as needed for moderate pain or severe pain. 12/31/18  Yes Barrett, Erin R, PA-C  rosuvastatin (CRESTOR) 10 MG tablet Take 1 tablet (10 mg total) by mouth daily at 6 PM. 08/28/18  Yes [name], Reynold Bowen, MD    Family History Family History  Problem Relation Age of Onset  . COPD Father   . Cancer Brother     Social  History Social History   Tobacco Use  . Smoking status: Former Smoker    Types: Cigarettes  . Smokeless tobacco: Never Used  . Tobacco comment: 30 PY  Substance Use Topics  . Alcohol use: No  . Drug use: No     Allergies   Patient has no known allergies.   Review of Systems Review of Systems  Constitutional: Positive for activity change.  Respiratory: Positive for cough and shortness of breath.   Cardiovascular: Negative for chest pain.  All other systems reviewed and are negative.    Physical Exam Updated Vital Signs BP 129/86 (BP Location: Right Arm)   Pulse (!) 117   Temp 98.5 F (36.9 C) (Oral)   Resp (!) 23   SpO2 100%   Physical Exam Vitals signs and nursing note reviewed.  Constitutional:      Appearance: He is well-developed.  HENT:     Head: Normocephalic and atraumatic.  Eyes:     Conjunctiva/sclera: Conjunctivae normal.     Pupils: Pupils are equal, round, and reactive to light.  Neck:     Musculoskeletal: Normal range of motion and neck supple.  Cardiovascular:     Rate and Rhythm: Normal rate and regular rhythm.  Pulmonary:     Effort:  Pulmonary effort is normal.     Breath sounds: Examination of the right-middle field reveals wheezing. Examination of the left-middle field reveals wheezing. Examination of the right-lower field reveals decreased breath sounds. Decreased breath sounds and wheezing present.  Abdominal:     General: Bowel sounds are normal. There is no distension.     Palpations: Abdomen is soft. There is no mass.     Tenderness: There is no abdominal tenderness. There is no guarding or rebound.  Musculoskeletal:        General: No deformity.  Skin:    General: Skin is warm.  Neurological:     Mental Status: He is alert and oriented to person, place, and time.      ED Treatments / Results  Labs (all labs ordered are listed, but only abnormal results are displayed) Labs Reviewed  COMPREHENSIVE METABOLIC PANEL - Abnormal; Notable for the following components:      Result Value   Glucose, Bld 146 (*)    BUN 25 (*)    Creatinine, Ser 2.28 (*)    Albumin 2.9 (*)    GFR calc non Af Amer 30 (*)    GFR calc Af Amer 35 (*)    All other components within normal limits  CBC WITH DIFFERENTIAL/PLATELET - Abnormal; Notable for the following components:   WBC 10.8 (*)    RBC 3.48 (*)    Hemoglobin 8.6 (*)    HCT 28.4 (*)    MCH 24.7 (*)    RDW 16.4 (*)    Platelets 591 (*)    Monocytes Absolute 1.1 (*)    Eosinophils Absolute 0.6 (*)    All other components within normal limits  BRAIN NATRIURETIC PEPTIDE - Abnormal; Notable for the following components:   B Natriuretic Peptide 152.8 (*)    All other components within normal limits  SARS CORONAVIRUS 2 (HOSPITAL ORDER, [facility])    EKG EKG Interpretation  Date/Time:  Sunday January 04 2019 01:57:20 EDT Ventricular Rate:  121 PR Interval:    QRS Duration: 106 QT Interval:  340 QTC Calculation: 483 R Axis:   37 Text Interpretation:  Sinus tachycardia Multiform ventricular premature complexes LVH with secondary  repolarization abnormality Anterior ST elevation, probably due to LVH Borderline prolonged QT interval no changes from last ekg Confirmed by Varney Biles [phone]) on 01/04/2019 2:33:40 AM  ED ECG REPORT   Date: 01/04/2019 12:49 am  Rate: 117  Rhythm: sinus tachycardia  QRS Axis: normal  Intervals: normal  ST/T Wave abnormalities: nonspecific ST/T changes  Conduction Disutrbances:none  Narrative Interpretation:   Old EKG Reviewed: changes noted  I have personally reviewed the EKG tracing and agree with the computerized printout as noted.   Radiology Dg Chest Port 1 View  Result Date: 01/04/2019 CLINICAL DATA:  63 year old male with recent bowel with placement presenting with shortness of breath. EXAM: PORTABLE CHEST 1 VIEW COMPARISON:  Chest radiograph dated 12/31/2018 and CT dated 12/28/2018 FINDINGS: Right-sided pleural effusion similar to prior radiograph with associated compressive atelectasis of the right lung base. Overall no significant interval change in the appearance of the lungs. The left lung is clear. No pneumothorax. Stable cardiac silhouette. Mechanical aortic bowel and sternal fixation plate. No acute osseous pathology. IMPRESSION: No significant interval change in the right-sided pleural effusion and associated compressive atelectasis of the right lung base. Electronically Signed   By: Anner Crete M.D.   On: 01/04/2019 01:06    Procedures .Critical Care Performed by: Varney Biles, MD Authorized by: Varney Biles, MD   Critical care provider statement:    Critical care time (minutes):  36   Critical care was necessary to treat or prevent imminent or life-threatening deterioration of the following conditions:  Cardiac failure, circulatory failure and respiratory failure   Critical care was time spent personally by me on the following activities:  Discussions with consultants, evaluation of patient's response to treatment, examination of patient, ordering and  performing treatments and interventions, ordering and review of laboratory studies, ordering and review of radiographic studies, pulse oximetry, re-evaluation of patient's condition, obtaining history from patient or surrogate and review of old charts  Ultrasound ED Echo  Date/Time: 01/04/2019 4:13 AM Performed by: Varney Biles, MD Authorized by: Varney Biles, MD   Procedure details:    Indications: dyspnea     Views: subxiphoid and parasternal long axis view     Images: archived     Limitations:  Acoustic shadowing Findings:    Pericardium: moderate pericardial effusion     Cardiac Activity: hyperdynamic     IVC: normal   Impression:    Impression: pericardial effusion present     (including critical care time)  Medications Ordered in ED Medications  morphine 4 MG/ML injection 4 mg (4 mg Intravenous Given 01/04/19 0151)     Initial Impression / Assessment and Plan / ED Course  I have reviewed the triage vital signs and the nursing notes.  Pertinent labs & imaging results that were available during my care of the patient were reviewed by me and considered in my medical decision making (see chart for details).  Clinical Course as of Jan 04 235  Sun Jan 04, 2019  0233 AKI is new. PE is not #1 on the differential diagnosis, therefore we will not get a CT PE right now.  Will discuss with the hospitalist to get VQ scan if they have a high suspicion for PE versus awaiting thoracentesis.  Repeat EKG is unchanged.  Given the recent bleeding episode, we will not start him on heparin (PE) at this moment.  Creatinine(!): 2.28 [AN]    Clinical Course User Index [AN] Varney Biles, MD       63 year old male with  recent history of aortic valve replacement, admission for shortness of breath that led to transfusion of PRBC and right-sided thoracentesis last week comes into the ER with chief complaint of shortness of breath.  It seems like he woke up this morning feeling short  of breath and as the day went on his symptoms get worse.  He does not have any orthopnea, PND.  On exam he is got diminished breath sounds of the right lower lung field along with some rales.  Distended neck veins noted.  No pitting edema.  No signs of DVT.  Differential diagnosis includes pleural effusion, pneumonia, bronchitis, CHF exacerbation, pericardial effusion and also PE.  Patient is not having any chest pain, and ACS is unlikely through the merits of negative catheterization in May.  Chest x-ray appears similar to his x-ray at the time of admission for symptomatic pleural effusion. Hemoglobin is over 8 during this visit.  Patient also is noted to have AKI.  At the moment he is requiring 6 L of O2 sat to maintain saturations over 90%.  He is not in respiratory distress.  We will admit him for further work-up for his hypoxic respiratory failure.  Plan will be for patient to be treated as someone with symptomatic pleural effusion, if he does not respond to thoracentesis then perhaps he might need a VQ scan to rule out PE.  Final Clinical Impressions(s) / ED Diagnoses   Final diagnoses:  Acute hypoxemic respiratory failure ([place])  AKI (acute kidney injury) [facility])    ED Discharge Orders    None       Varney Biles, MD 01/04/19 0154    Varney Biles, MD 01/04/19 [phone]

## 2019-01-05 ENCOUNTER — Ambulatory Visit: Payer: No Typology Code available for payment source | Admitting: Thoracic Surgery (Cardiothoracic Vascular Surgery)

## 2019-01-05 ENCOUNTER — Ambulatory Visit: Payer: Non-veteran care | Admitting: Cardiology

## 2019-01-05 DIAGNOSIS — J90 Pleural effusion, not elsewhere classified: Secondary | ICD-10-CM

## 2019-01-05 LAB — BASIC METABOLIC PANEL
Anion gap: 11 (ref 5–15)
BUN: 15 mg/dL (ref 8–23)
CO2: 24 mmol/L (ref 22–32)
Calcium: 8.3 mg/dL — ABNORMAL LOW (ref 8.9–10.3)
Chloride: 101 mmol/L (ref 98–111)
Creatinine, Ser: 1.47 mg/dL — ABNORMAL HIGH (ref 0.61–1.24)
GFR calc Af Amer: 59 mL/min — ABNORMAL LOW (ref >60)
GFR calc non Af Amer: 51 mL/min — ABNORMAL LOW (ref >60)
Glucose, Bld: 138 mg/dL — ABNORMAL HIGH (ref 70–99)
Potassium: 3.9 mmol/L (ref 3.5–5.1)
Sodium: 136 mmol/L (ref 135–145)

## 2019-01-05 LAB — CBC
HCT: 25.8 % — ABNORMAL LOW (ref 39.0–52.0)
Hemoglobin: 7.8 g/dL — ABNORMAL LOW (ref 13.0–17.0)
MCH: 24.1 pg — ABNORMAL LOW (ref 26.0–34.0)
MCHC: 30.2 g/dL (ref 30.0–36.0)
MCV: 79.6 fL — ABNORMAL LOW (ref 80.0–100.0)
Platelets: 539 10*3/uL — ABNORMAL HIGH (ref 150–400)
RBC: 3.24 MIL/uL — ABNORMAL LOW (ref 4.22–5.81)
RDW: 16.6 % — ABNORMAL HIGH (ref 11.5–15.5)
WBC: 8.1 10*3/uL (ref 4.0–10.5)
nRBC: 0 % (ref 0.0–0.2)

## 2019-01-05 LAB — GLUCOSE, CAPILLARY
Glucose-Capillary: 96 mg/dL (ref 70–99)
Glucose-Capillary: 93 mg/dL (ref 70–99)
Glucose-Capillary: 104 mg/dL — ABNORMAL HIGH (ref 70–99)
Glucose-Capillary: 133 mg/dL — ABNORMAL HIGH (ref 70–99)

## 2019-01-05 LAB — PH, BODY FLUID: pH, Body Fluid: 7.4

## 2019-01-05 MED ORDER — POTASSIUM CHLORIDE CRYS ER 20 MEQ PO TBCR
20.0000 meq | EXTENDED_RELEASE_TABLET | Freq: Every day | ORAL | Status: DC
  Administered 2019-01-05 – 2019-01-07 (×3): 20 meq via ORAL
  Filled 2019-01-05 (×3): qty 1

## 2019-01-05 MED ORDER — FUROSEMIDE 40 MG PO TABS
40.0000 mg | ORAL_TABLET | Freq: Every day | ORAL | Status: DC
  Administered 2019-01-05 – 2019-01-07 (×3): 40 mg via ORAL
  Filled 2019-01-05 (×3): qty 1

## 2019-01-05 MED ORDER — POLYETHYLENE GLYCOL 3350 17 G PO PACK: 17.0000 g | PACK | Freq: Every day | ORAL | Status: DC | PRN

## 2019-01-05 MED ORDER — SODIUM CHLORIDE 0.9 % IV SOLN
510.0000 mg | Freq: Once | INTRAVENOUS | Status: AC
  Administered 2019-01-05: 510 mg via INTRAVENOUS
  Filled 2019-01-05: qty 17

## 2019-01-05 NOTE — Progress Notes (Signed)
SATURATION QUALIFICATIONS: (This note is used to comply with regulatory documentation for home oxygen)  Patient Saturations on Room Air at Rest = 88%  Patient Saturations on Room Air while Ambulating = 84%  Patient Saturations on 4 Liters of oxygen while Ambulating = 89%  Please briefly explain why patient needs home oxygen:Pt needed 2LO2 at rest to keep sats >88% and 4L with activity.  Will follow acutely.  [place] Pager:  [phone]  Office:  [phone]

## 2019-01-05 NOTE — Progress Notes (Signed)
PROGRESS NOTE    Charles Riggs.  YC:[id number] DOB: 1955/06/16 DOA: 01/04/2019 PCP: [facility]  Brief Narrative: 63 year old African-American male with history of chronic diastolic CHF, CKD stage III, who just underwent bioprosthetic aortic valve replacement on 12/16/2018, subsequently discharged home on diuretics. -Readmitted on 8/2 with dyspnea on exertion this was felt to be secondary to worsening anemia and right pleural effusion, he had 3 units of PRBC transfused and a thoracentesis, subsequently discharged home on Lasix for 5 days on 8/5.-Presented back to the ER 8/8 night with worsening dyspnea on exertion, hypoxia. -Hb stable but low, small to mod R Pleural effusion  Assessment & Plan:   1.    Recurrent bloody pleural effusions  -Had 1.9 L of blood drained by IR on 8/3  -Yesterday 8/9 had 1.2 L of bloody pleural fluid drained again  -Recent AVR 7/21 will ask T CTS for input given recurrence  2.  Acute hypoxic respiratory failure -I suspect this is secondary to above and anemia -Wean off O2, status post thoracentesis yesterday -VQ scan without any ventilation/perfusion mismatch, Dopplers negative for DVT, clinically do not suspect acute PE -Echo last week post aVR was unremarkable  3.  Acute on chronic anemia -Due to postop blood loss, and bloody effusions -Hemoglobin is stable since recent discharge -Anemia panel with severe iron deficiency will give IV iron now -No evidence of hemolysis bilirubin is normal  4.  Status post recent AVR 7/21 -Stable -was due for follow-up with Dr. Roxy Manns today  5. Chronic systolic and diastolic CHF -Does not appear overtly volume overloaded to me, EF is 45% on recent echo -Resume oral Lasix today  6.  AKI on CKD 3 -Creatinine slightly higher than baseline at 2.2 yesterday, now down to 1.4 which is his baseline  7. OSA -CPAP nightly  DVT prophylaxis: Lovenox Code Status: Full code Family Communication: No family at  bedside Disposition Plan: Home pending above management  Consultants:   TCTS   Procedures:   Antimicrobials:    Subjective: -Breathing better today, mild discomfort at the site of surgery -No nausea vomiting,  Objective: Vitals:   01/05/19 0415 01/05/19 0557 01/05/19 0728 01/05/19 0755  BP: 132/85   122/84  Pulse: (!) 106   (!) 108  Resp: (!) 31   (!) 22  Temp: 97.7 F (36.5 C)   97.6 F (36.4 C)  TempSrc: Oral   Oral  SpO2: 95%  99% 96%  Weight:  107 kg    Height:        Intake/Output Summary (Last 24 hours) at 01/05/2019 0901 Last data filed at 01/05/2019 0857 Gross per 24 hour  Intake 483 ml  Output 800 ml  Net -317 ml   Filed Weights   01/04/19 0550 01/05/19 0557  Weight: 107.1 kg 107 kg    Examination: Gen: Awake, Alert, Oriented X 3, sitting up in bed, no distress today HEENT: PERRLA, Neck supple, no JVD Lungs: Decreased breath sounds at both bases, otherwise clear CVS: S1-S2/regular rate rhythm Abd: soft, Non tender, non distended, BS present Extremities: No edema, ankle bracelet Skin: no new rashes Psychiatry: Judgement and insight appear normal. Mood & affect appropriate.     Data Reviewed:   CBC: Recent Labs  Lab 12/30/18 0547 12/31/18 0451 01/04/19 0053 01/04/19 0722 01/05/19 0445  WBC 11.1* 9.2 10.8* 8.1 8.1  NEUTROABS  --   --  7.7 5.6  --   HGB 8.0* 7.9* 8.6* 7.8* 7.8*  HCT 25.1* 25.1*  28.4* 25.4* 25.8*  MCV 80.2 81.2 81.6 79.1* 79.6*  PLT 414* 458* 591* 558* [id number]*   Basic Metabolic Panel: Recent Labs  Lab 12/30/18 0547 01/04/19 0053 01/05/19 0445  NA 135 142 136  K 3.8 4.4 3.9  CL 99 105 101  CO2 23 23 24   GLUCOSE 134* 146* 138*  BUN 16 25* 15  CREATININE 1.59* 2.28* 1.47*  CALCIUM 8.5* 9.0 8.3*   GFR: Estimated Creatinine Clearance: 65.7 mL/min (A) (by C-G formula based on SCr of 1.47 mg/dL (H)). Liver Function Tests: Recent Labs  Lab 01/04/19 0053  AST 16  ALT 14  ALKPHOS 59  BILITOT 0.8  PROT 6.8   ALBUMIN 2.9*   No results for input(s): LIPASE, AMYLASE in the last 168 hours. No results for input(s): AMMONIA in the last 168 hours. Coagulation Profile: No results for input(s): INR, PROTIME in the last 168 hours. Cardiac Enzymes: No results for input(s): CKTOTAL, CKMB, CKMBINDEX, TROPONINI in the last 168 hours. BNP (last 3 results) No results for input(s): PROBNP in the last 8760 hours. HbA1C: No results for input(s): HGBA1C in the last 72 hours. CBG: Recent Labs  Lab 01/04/19 0820 01/04/19 1305 01/04/19 1712 01/04/19 2204 01/05/19 0754  GLUCAP 115* 100* 120* 114* 96   Lipid Profile: No results for input(s): CHOL, HDL, LDLCALC, TRIG, CHOLHDL, LDLDIRECT in the last 72 hours. Thyroid Function Tests: No results for input(s): TSH, T4TOTAL, FREET4, T3FREE, THYROIDAB in the last 72 hours. Anemia Panel: Recent Labs    01/04/19 1225  VITAMINB12 394  FOLATE 23.3  FERRITIN 133  TIBC 293  IRON 11*  RETICCTPCT 2.4   Urine analysis:    Component Value Date/Time   COLORURINE YELLOW 12/11/2018 1130   APPEARANCEUR CLEAR 12/11/2018 1130   LABSPEC 1.030 12/11/2018 1130   PHURINE 5.0 12/11/2018 1130   GLUCOSEU NEGATIVE 12/11/2018 1130   HGBUR NEGATIVE 12/11/2018 1130   HGBUR large 01/05/2009 1309   BILIRUBINUR MODERATE (A) 12/11/2018 1130   KETONESUR NEGATIVE 12/11/2018 1130   PROTEINUR NEGATIVE 12/11/2018 1130   UROBILINOGEN 0.2 01/05/2009 1309   NITRITE NEGATIVE 12/11/2018 1130   LEUKOCYTESUR NEGATIVE 12/11/2018 1130   Sepsis Labs: @LABRCNTIP (procalcitonin:4,lacticidven:4)  ) Recent Results (from the past 240 hour(s))  SARS CORONAVIRUS 2 Nasal Swab Aptima Multi Swab     Status: None   Collection Time: 12/28/18  4:39 PM   Specimen: Aptima Multi Swab; Nasal Swab  Result Value Ref Range Status   SARS Coronavirus 2 NEGATIVE NEGATIVE Final    Comment: (NOTE) SARS-CoV-2 target nucleic acids are NOT DETECTED. The SARS-CoV-2 RNA is generally detectable in upper and  lower respiratory specimens during the acute phase of infection. Negative results do not preclude SARS-CoV-2 infection, do not rule out co-infections with other pathogens, and should not be used as the sole basis for treatment or other patient management decisions. Negative results must be combined with clinical observations, patient history, and epidemiological information. The expected result is Negative. Fact Sheet for Patients: SugarRoll.be Fact Sheet for Healthcare Providers: https://www.woods-mathews.com/ This test is not yet approved or cleared by the [place] FDA and  has been authorized for detection and/or diagnosis of SARS-CoV-2 by FDA under an Emergency Use Authorization (EUA). This EUA will remain  in effect (meaning this test can be used) for the duration of the COVID-19 declaration under Section 56 4(b)(1) of the Act, 21 U.S.C. section 360bbb-3(b)(1), unless the authorization is terminated or revoked sooner. Performed at [facility], [place] [address]  [place], [place] [address]   Gram stain     Status: None   Collection Time: 12/29/18 10:23 AM   Specimen: Pleural, Right; Pleural Fluid  Result Value Ref Range Status   Specimen Description PLEURAL RIGHT  Final   Special Requests NONE  Final   Gram Stain   Final    RARE WBC PRESENT,BOTH PMN AND MONONUCLEAR NO ORGANISMS SEEN Performed at [facility], [place] [address] [place], [place] [address]    Report Status 12/29/2018 FINAL  Final  Culture, body fluid-bottle     Status: None   Collection Time: 12/29/18 10:23 AM   Specimen: Pleura  Result Value Ref Range Status   Specimen Description PLEURAL RIGHT  Final   Special Requests NONE  Final   Culture   Final    NO GROWTH 5 DAYS Performed at [facility] [address], [place], [place] [address]    Report Status 01/03/2019 FINAL  Final  SARS Coronavirus 2 [facility] order, Performed in [facility]  hospital lab) Nasopharyngeal Nasopharyngeal Swab     Status: None   Collection Time: 01/04/19  1:50 AM   Specimen: Nasopharyngeal Swab  Result Value Ref Range Status   SARS Coronavirus 2 NEGATIVE NEGATIVE Final    Comment: (NOTE) If result is NEGATIVE SARS-CoV-2 target nucleic acids are NOT DETECTED. The SARS-CoV-2 RNA is generally detectable in upper and lower  respiratory specimens during the acute phase of infection. The lowest  concentration of SARS-CoV-2 viral copies this assay can detect is 250  copies / mL. A negative result does not preclude SARS-CoV-2 infection  and should not be used as the sole basis for treatment or other  patient management decisions.  A negative result may occur with  improper specimen collection / handling, submission of specimen other  than nasopharyngeal swab, presence of viral mutation(s) within the  areas targeted by this assay, and inadequate number of viral copies  (<250 copies / mL). A negative result must be combined with clinical  observations, patient history, and epidemiological information. If result is POSITIVE SARS-CoV-2 target nucleic acids are DETECTED. The SARS-CoV-2 RNA is generally detectable in upper and lower  respiratory specimens dur ing the acute phase of infection.  Positive  results are indicative of active infection with SARS-CoV-2.  Clinical  correlation with patient history and other diagnostic information is  necessary to determine patient infection status.  Positive results do  not rule out bacterial infection or co-infection with other viruses. If result is PRESUMPTIVE POSTIVE SARS-CoV-2 nucleic acids MAY BE PRESENT.   A presumptive positive result was obtained on the submitted specimen  and confirmed on repeat testing.  While 2019 novel coronavirus  (SARS-CoV-2) nucleic acids may be present in the submitted sample  additional confirmatory testing may be necessary for epidemiological  and / or clinical management  purposes  to differentiate between  SARS-CoV-2 and other Sarbecovirus currently known to infect humans.  If clinically indicated additional testing with an alternate test  methodology [id number]) is advised. The SARS-CoV-2 RNA is generally  detectable in upper and lower respiratory sp ecimens during the acute  phase of infection. The expected result is Negative. Fact Sheet for Patients:  StrictlyIdeas.no Fact Sheet for Healthcare Providers: BankingDealers.co.za This test is not yet approved or cleared by the [place] FDA and has been authorized for detection and/or diagnosis of SARS-CoV-2 by FDA under an Emergency Use Authorization (EUA).  This EUA will remain in effect (meaning this test can be  used) for the duration of the COVID-19 declaration under Section 564(b)(1) of the Act, 21 U.S.C. section 360bbb-3(b)(1), unless the authorization is terminated or revoked sooner. Performed at [facility], [place] [address], [place], [place] [address]   MRSA PCR Screening     Status: None   Collection Time: 01/04/19  6:15 AM   Specimen: Nasopharyngeal  Result Value Ref Range Status   MRSA by PCR NEGATIVE NEGATIVE Final    Comment:        The GeneXpert MRSA Assay (FDA approved for NASAL specimens only), is one component of a comprehensive MRSA colonization surveillance program. It is not intended to diagnose MRSA infection nor to guide or monitor treatment for MRSA infections. Performed at [facility], [place] [address] [place], [place] [address]   Gram stain     Status: None   Collection Time: 01/04/19 12:28 PM   Specimen: Pleura  Result Value Ref Range Status   Specimen Description PLEURAL RIGHT  Final   Special Requests NONE  Final   Gram Stain   Final    RARE WBC PRESENT,BOTH PMN AND MONONUCLEAR NO ORGANISMS SEEN Performed at [facility], [place] [address], [place], [place] [address]    Report Status 01/04/2019  FINAL  Final         Radiology Studies: Dg Chest 1 View  Result Date: 01/04/2019 CLINICAL DATA:  Status post right thoracentesis, cough EXAM: CHEST  1 VIEW COMPARISON:  Chest radiograph from earlier today. FINDINGS: Aortic valve prosthesis in place. Stable configuration of horizontal surgical plate with interlocking screws overlying mid to upper mediastinum. Stable cardiomediastinal silhouette with top-normal heart size. No pneumothorax. Small right pleural effusion is decreased. No left pleural effusion. Hazy right perihilar lung opacity is similar. Slightly improved aeration at the right lung base with persistent patchy right lung base opacity. Clear left lung. IMPRESSION: 1. No pneumothorax.  Small right pleural effusion is decreased. 2. Improved aeration at the right lung base with persistent patchy right lung base opacity, favor atelectasis. 3. Stable hazy right parahilar opacity, which could represent mild asymmetric pulmonary edema. Electronically Signed   By: Ilona Sorrel M.D.   On: 01/04/2019 12:45   Nm Pulmonary Perf And Vent  Result Date: 01/04/2019 CLINICAL DATA:  Shortness of breath.  Post thoracentesis. EXAM: NUCLEAR MEDICINE VENTILATION - PERFUSION LUNG SCAN TECHNIQUE: Ventilation images were obtained in multiple projections using inhaled aerosol Tc-58m DTPA. Perfusion images were obtained in multiple projections after intravenous injection of Tc-31m MAA. RADIOPHARMACEUTICALS:  32.5 mCi of Tc-25m DTPA aerosol inhalation and 1.5 mCi Tc90m MAA IV COMPARISON:  Portable chest 01/04/2019 and 12/28/2018. FINDINGS: Ventilation: There is abnormal ventilation to both lungs with multiple small to moderate peripheral ventilation defects. Perfusion: There is abnormal perfusion of both lungs with multiple small to moderate perfusion defects. These are matched to the ventilatory defects. No ventilation perfusion mismatches are identified. The recent radiographs demonstrate a residual right pleural  effusion and right basilar pulmonary opacity. IMPRESSION: Multiple matched ventilatory and perfusion defects in both lungs. Findings are indeterminate for pulmonary embolism. Consider further evaluation with chest CTA with contrast. If patient unable to receive intravenous contrast, correlation with lower extremity Doppler ultrasound may be helpful. Electronically Signed   By: Richardean Sale M.D.   On: 01/04/2019 13:42   Dg Chest Port 1 View  Result Date: 01/04/2019 CLINICAL DATA:  63 year old male with recent bowel with placement presenting with shortness of breath. EXAM: PORTABLE CHEST 1 VIEW COMPARISON:  Chest radiograph dated 12/31/2018 and CT dated 12/28/2018 FINDINGS: Right-sided pleural effusion similar to prior radiograph with associated compressive atelectasis of the right lung base. Overall no significant interval change in the appearance of the lungs. The left lung is clear. No pneumothorax. Stable cardiac silhouette. Mechanical aortic bowel and sternal fixation plate. No acute osseous pathology. IMPRESSION: No significant interval change in the right-sided pleural effusion and associated compressive atelectasis of the right lung base. Electronically Signed   By: Anner Crete M.D.   On: 01/04/2019 01:06   Vas [place] Lower Extremity Venous (dvt)  Result Date: 01/04/2019  Lower Venous Study Indications: Edema.  Comparison Study: no prior Performing Technologist: Abram Sander RVS  Examination Guidelines: A complete evaluation includes B-mode imaging, spectral Doppler, color Doppler, and power Doppler as needed of all accessible portions of each vessel. Bilateral testing is considered an integral part of a complete examination. Limited examinations for reoccurring indications may be performed as noted.  +---------+---------------+---------+-----------+----------+--------------+  RIGHT     Compressibility Phasicity Spontaneity Properties Summary          +---------+---------------+---------+-----------+----------+--------------+  CFV       Full            Yes       Yes                                    +---------+---------------+---------+-----------+----------+--------------+  SFJ       Full                                                             +---------+---------------+---------+-----------+----------+--------------+  FV Prox   Full                                                             +---------+---------------+---------+-----------+----------+--------------+  FV Mid    Full                                                             +---------+---------------+---------+-----------+----------+--------------+  FV Distal Full                                                             +---------+---------------+---------+-----------+----------+--------------+  PFV       Full                                                             +---------+---------------+---------+-----------+----------+--------------+  POP       Full  Yes       Yes                                    +---------+---------------+---------+-----------+----------+--------------+  PTV       Full                                                             +---------+---------------+---------+-----------+----------+--------------+  PERO                                                       Not visualized  +---------+---------------+---------+-----------+----------+--------------+   +---------+---------------+---------+-----------+----------+-------+  LEFT      Compressibility Phasicity Spontaneity Properties Summary  +---------+---------------+---------+-----------+----------+-------+  CFV       Full            Yes       Yes                             +---------+---------------+---------+-----------+----------+-------+  SFJ       Full                                                      +---------+---------------+---------+-----------+----------+-------+  FV Prox   Full                                                       +---------+---------------+---------+-----------+----------+-------+  FV Mid    Full                                                      +---------+---------------+---------+-----------+----------+-------+  FV Distal Full                                                      +---------+---------------+---------+-----------+----------+-------+  PFV       Full                                                      +---------+---------------+---------+-----------+----------+-------+  POP       Full            Yes       Yes                             +---------+---------------+---------+-----------+----------+-------+  PTV       Full                                                      +---------+---------------+---------+-----------+----------+-------+  PERO      Full                                                      +---------+---------------+---------+-----------+----------+-------+     Summary: Right: There is no evidence of deep vein thrombosis in the lower extremity. No cystic structure found in the popliteal fossa. Left: There is no evidence of deep vein thrombosis in the lower extremity. No cystic structure found in the popliteal fossa.  *See table(s) above for measurements and observations. Electronically signed by Harold Barban MD on 01/04/2019 at 12:29:18 PM.    Final    US Thoracentesis Asp Pleural Space W/img Guide  Result Date: 01/04/2019 INDICATION: Patient s/p minimally invasive aortic valve replacement 12/16/2018 via right mini thoracotomy approach now with dyspnea and recurrent right pleural effusion. Request is made for therapeutic right thoracentesis. EXAM: ULTRASOUND GUIDED THERAPEUTIC RIGHT THORACENTESIS MEDICATIONS: 10 mL 1% lidocaine COMPLICATIONS: None immediate. PROCEDURE: An ultrasound guided thoracentesis was thoroughly discussed with the patient and questions answered. The benefits, risks, alternatives and complications were also discussed.  The patient understands and wishes to proceed with the procedure. Written consent was obtained. Ultrasound was performed to localize and mark an adequate pocket of fluid in the right chest. The area was then prepped and draped in the normal sterile fashion. 1% Lidocaine was used for local anesthesia. Under ultrasound guidance a 6 Fr Safe-T-Centesis catheter was introduced. Thoracentesis was performed. The catheter was removed and a dressing applied. FINDINGS: A total of approximately 1.25 L of dark red fluid was removed. Specimen was sent for labs. IMPRESSION: Successful ultrasound guided right thoracentesis yielding 1.25 L of pleural fluid. Read by: Earley Abide, PA-C Electronically Signed   By: Corrie Mckusick D.O.   On: 01/04/2019 13:11        Scheduled Meds:  acetaminophen  500 mg Oral TID   aspirin EC  81 mg Oral Daily   dextromethorphan-guaiFENesin  1 tablet Oral BID   enoxaparin (LOVENOX) injection  40 mg Subcutaneous Q24H   ferrous [id number] C-folic acid  1 capsule Oral TID PC   gabapentin  300 mg Oral BID   hydrOXYzine  25 mg Oral TID   insulin aspart  0-5 Units Subcutaneous QHS   insulin aspart  0-9 Units Subcutaneous TID WC   metoprolol tartrate  25 mg Oral BID   mometasone-formoterol  2 puff Inhalation BID   montelukast  10 mg Oral QPM   pantoprazole  40 mg Oral Daily   rosuvastatin  10 mg Oral q1800   sodium chloride flush  3 mL Intravenous Q12H   Continuous Infusions:  sodium chloride       LOS: 1 day    Time spent: 40min    Domenic Polite, MD [facility]   01/05/2019, 9:01 AM

## 2019-01-05 NOTE — Evaluation (Signed)
Physical Therapy Evaluation Patient Details Name: Charles Riggs. MRN: [id number] DOB: March 24, 1956 Today's Date: 01/05/2019   History of Present Illness   63 year old African-American male with history of chronic diastolic CHF, CKD stage III, who just underwent bioprosthetic aortic valve replacement on 12/16/2018, subsequently discharged home on diuretics.Readmitted on 8/2 with dyspnea on exertion this was felt to be secondary to worsening anemia and right pleural effusion, he had 3 units of PRBC transfused and a thoracentesis, subsequently discharged home on Lasix for 5 days on 8/5.-Presented back to the ER 8/8 night with worsening dyspnea on exertion, hypoxia.  Recurrent pleural effusion with thoracentesis 8/9.   Clinical Impression  Pt admitted with above diagnosis. Pt currently with functional limitations due to the deficits listed below (see PT Problem List). Pt was able to ambulate around unit with DOE 4/4 and needed standing rest breaks as well as O2 incr from 2L to 4L to keep sats >87% with activity.  Pt took at least 3 min to return to >90% and for O2 to be placed back on 2L. Will follow acutely.   Pt will benefit from skilled PT to increase their independence and safety with mobility to allow discharge to the venue listed below.      Follow Up Recommendations Home health PT;Supervision - Intermittent    Equipment Recommendations  (rollator)    Recommendations for Other Services       Precautions / Restrictions Precautions Precautions: None Precaution Comments: (watch sats) Restrictions Weight Bearing Restrictions: No      Mobility  Bed Mobility Overal bed mobility: Modified Independent                Transfers Overall transfer level: Needs assistance Equipment used: None Transfers: Sit to/from Stand Sit to Stand: Supervision            Ambulation/Gait Ambulation/Gait assistance: Min guard Gait Distance (Feet): 280 Feet Assistive device: None Gait  Pattern/deviations: Step-through pattern Gait velocity: slowed Gait velocity interpretation: <1.8 ft/sec, indicate of risk for recurrent falls General Gait Details: Pt slow and guarded gait with DOE 4/4 by end of walk.  Had to incr O2 from 2L to 4L to keep sats >87%.    Stairs            Wheelchair Mobility    Modified Rankin (Stroke Patients Only)       Balance Overall balance assessment: Mild deficits observed, not formally tested                                           Pertinent Vitals/Pain Pain Assessment: Faces Faces Pain Scale: Hurts whole lot Pain Location: chest and back Pain Descriptors / Indicators: Aching Pain Intervention(s): Limited activity within patient's tolerance;Monitored during session;Premedicated before session;Repositioned    Home Living Family/patient expects to be discharged to:: Private residence Living Arrangements: Spouse/significant other Available Help at Discharge: Family;Available 24 hours/day Type of Home: House Home Access: Stairs to enter Entrance Stairs-Rails: None Entrance Stairs-Number of Steps: 3 Home Layout: Two level;Able to live on main level with bedroom/bathroom;Bed/bath upstairs;Full bath on main level Home Equipment: Shower seat - built in;Hand held shower head(electric bed)      Prior Function Level of Independence: Independent               Hand Dominance   Dominant Hand: Left    Extremity/Trunk Assessment   Upper Extremity  Assessment Upper Extremity Assessment: Defer to OT evaluation    Lower Extremity Assessment Lower Extremity Assessment: Overall WFL for tasks assessed    Cervical / Trunk Assessment Cervical / Trunk Assessment: Normal  Communication   Communication: No difficulties  Cognition Arousal/Alertness: Awake/alert Behavior During Therapy: WFL for tasks assessed/performed Overall Cognitive Status: Within Functional Limits for tasks assessed                                         General Comments      Exercises     Assessment/Plan    PT Assessment Patient needs continued PT services  PT Problem List Decreased strength;Decreased activity tolerance;Decreased mobility;Pain       PT Treatment Interventions DME instruction;Gait training;Stair training;Functional mobility training;Therapeutic activities;Therapeutic exercise;Balance training;Cognitive remediation;Patient/family education    PT Goals (Current goals can be found in the Care Plan section)  Acute Rehab PT Goals Patient Stated Goal: to get off Oxygen and be able to breathe PT Goal Formulation: With patient Time For Goal Achievement: 01/19/19 Potential to Achieve Goals: Fair    Frequency Min 3X/week   Barriers to discharge        Co-evaluation               AM-PAC PT "6 Clicks" Mobility  Outcome Measure Help needed turning from your back to your side while in a flat bed without using bedrails?: None Help needed moving from lying on your back to sitting on the side of a flat bed without using bedrails?: None Help needed moving to and from a bed to a chair (including a wheelchair)?: None Help needed standing up from a chair using your arms (e.g., wheelchair or bedside chair)?: None Help needed to walk in hospital room?: A Little Help needed climbing 3-5 steps with a railing? : A Little 6 Click Score: 22    End of Session Equipment Utilized During Treatment: Gait belt;Oxygen Activity Tolerance: Patient limited by fatigue Patient left: in bed;with call bell/phone within reach;with bed alarm set;with family/visitor present Nurse Communication: Mobility status PT Visit Diagnosis: Other abnormalities of gait and mobility (R26.89);Difficulty in walking, not elsewhere classified (R26.2);Muscle weakness (generalized) (M62.81);Pain Pain - part of body: (chest and back)    Time: [id number] PT Time Calculation (min) (ACUTE ONLY): 23 min   Charges:   PT  Evaluation $PT Eval Moderate Complexity: 1 Mod PT Treatments $Gait Training: 8-22 mins        [facility] Pager:  [phone]  Office:  [place] 01/05/2019, 4:24 PM

## 2019-01-05 NOTE — Progress Notes (Addendum)
      [address]       [place] [address]             [phone]      Subjective:  No new complaints. Is breathing better.  He asks why his fluid is re-accumulated.  Objective: Vital signs in last 24 hours: Temp:  [97.6 F (36.4 C)-98.9 F (37.2 C)] 97.6 F (36.4 C) (08/10 0755) Pulse Rate:  [98-115] 108 (08/10 0755) Cardiac Rhythm: Sinus tachycardia (08/10 0700) Resp:  [18-32] 22 (08/10 0755) BP: (122-139)/(78-88) 122/84 (08/10 0755) SpO2:  [91 %-100 %] 96 % (08/10 0755) Weight:  [id number] kg] 107 kg (08/10 0557)  Intake/Output from previous day: 08/09 0701 - 08/10 0700 In: 243 [P.O.:240; I.V.:3] Out: 550 [Urine:550] Intake/Output this shift: Total I/O In: 240 [P.O.:240] Out: 250 [Urine:250]  General appearance: alert, cooperative and no distress Heart: regular rate and rhythm Lungs: diminished breath sounds right base Abdomen: soft, non-tender; bowel sounds normal; no masses,  no organomegaly Extremities: extremities normal, atraumatic, no cyanosis or edema Wound: clean and dry  Lab Results: Recent Labs    01/04/19 0722 01/05/19 0445  WBC 8.1 8.1  HGB 7.8* 7.8*  HCT 25.4* 25.8*  PLT 558* 539*   BMET:  Recent Labs    01/04/19 0053 01/05/19 0445  NA 142 136  K 4.4 3.9  CL 105 101  CO2 23 24  GLUCOSE 146* 138*  BUN 25* 15  CREATININE 2.28* 1.47*  CALCIUM 9.0 8.3*    PT/INR: No results for input(s): LABPROT, INR in the last 72 hours. ABG    Component Value Date/Time   PHART 7.340 (L) 12/16/2018 1637   HCO3 24.9 12/16/2018 1637   TCO2 23 12/16/2018 2134   ACIDBASEDEF 1.0 12/16/2018 1637   O2SAT 95.0 12/16/2018 1637   CBG (last 3)  Recent Labs    01/04/19 1712 01/04/19 2204 01/05/19 0754  GLUCAP 120* 114* 96    Assessment/Plan:  1. Recurrent Pleural Effusion- S/P Thoracentesis 1.2L removed of bloody fluid 2. Post operative blood loss anemia- hgb is 7.8 3. Dispo- patient breathing better, unsure why fluid would be  bloody, he is likely experiencing some component of CHF, if fluid re-accumulates will likely need Pleur-x   LOS: 1 day    Erin Barrett 01/05/2019  I have seen and examined the patient and agree with the assessment and plan as outlined.  Hemoglobin has remained stable for the past week.  There are no findings to suggest ongoing blood loss and bloody pleural effusion is likely related to blood which accumulated early following surgery.  I have reassured patient that postoperative pleural effusions are not uncommon, and his pleural effusion was not completely evacuated at the time of his first thoracentesis.  Moreover, the presence of old blood in the chest tends to increase the likelihood of reaccumulation of transudate of fluid.  Hopefully it will not recur again, but if it does it may be prudent to consider placement of Pleurx catheter.  We will check follow-up PA and lateral chest x-ray tomorrow morning.  Rexene Alberts, MD 01/05/2019 1:06 PM

## 2019-01-06 ENCOUNTER — Inpatient Hospital Stay (HOSPITAL_COMMUNITY): Payer: No Typology Code available for payment source

## 2019-01-06 LAB — CBC
HCT: 25.5 % — ABNORMAL LOW (ref 39.0–52.0)
Hemoglobin: 8 g/dL — ABNORMAL LOW (ref 13.0–17.0)
MCH: 24.9 pg — ABNORMAL LOW (ref 26.0–34.0)
MCHC: 31.4 g/dL (ref 30.0–36.0)
MCV: 79.4 fL — ABNORMAL LOW (ref 80.0–100.0)
Platelets: 551 10*3/uL — ABNORMAL HIGH (ref 150–400)
RBC: 3.21 MIL/uL — ABNORMAL LOW (ref 4.22–5.81)
RDW: 16.7 % — ABNORMAL HIGH (ref 11.5–15.5)
WBC: 7.6 10*3/uL (ref 4.0–10.5)
nRBC: 0 % (ref 0.0–0.2)

## 2019-01-06 LAB — BASIC METABOLIC PANEL
Anion gap: 10 (ref 5–15)
BUN: 12 mg/dL (ref 8–23)
CO2: 25 mmol/L (ref 22–32)
Calcium: 8.6 mg/dL — ABNORMAL LOW (ref 8.9–10.3)
Chloride: 102 mmol/L (ref 98–111)
Creatinine, Ser: 1.35 mg/dL — ABNORMAL HIGH (ref 0.61–1.24)
GFR calc Af Amer: >60 mL/min (ref >60)
GFR calc non Af Amer: 56 mL/min — ABNORMAL LOW (ref >60)
Glucose, Bld: 126 mg/dL — ABNORMAL HIGH (ref 70–99)
Potassium: 4.2 mmol/L (ref 3.5–5.1)
Sodium: 137 mmol/L (ref 135–145)

## 2019-01-06 LAB — GLUCOSE, CAPILLARY
Glucose-Capillary: 101 mg/dL — ABNORMAL HIGH (ref 70–99)
Glucose-Capillary: 101 mg/dL — ABNORMAL HIGH (ref 70–99)
Glucose-Capillary: 153 mg/dL — ABNORMAL HIGH (ref 70–99)
Glucose-Capillary: 84 mg/dL (ref 70–99)

## 2019-01-06 NOTE — Progress Notes (Signed)
Physical Therapy Treatment Patient Details Name: Charles Riggs. MRN: [id number] DOB: 1955/10/20 Today's Date: 01/06/2019    History of Present Illness  63 year old African-American male with history of chronic diastolic CHF, CKD stage III, who just underwent bioprosthetic aortic valve replacement on 12/16/2018, subsequently discharged home on diuretics.Readmitted on 8/2 with dyspnea on exertion this was felt to be secondary to worsening anemia and right pleural effusion, he had 3 units of PRBC transfused and a thoracentesis, subsequently discharged home on Lasix for 5 days on 8/5.-Presented back to the ER 8/8 night with worsening dyspnea on exertion, hypoxia.  Recurrent pleural effusion with thoracentesis 8/9.     PT Comments    Continuing work on functional mobility and activity tolerance;  Today, Charles Riggs is declining progressive amb; He had just been up and around in his room, bathed, and was laying down, catching his breath; I assisted him in re-hooking up his telemetry; We discussed importance of self-monitoring for activity tolerance, accaptable levels for O2 sats, and what to anticipate as we are hopeful to wean supplemental O2; He verbalized understanding; Will continue to follow  Follow Up Recommendations  Home health PT;Supervision - Intermittent     Equipment Recommendations    Rollator RW   Recommendations for Other Services       Precautions / Restrictions Precautions Precaution Comments: (watch sats)    Mobility  Bed Mobility                  Transfers                    Ambulation/Gait                 Stairs             Wheelchair Mobility    Modified Rankin (Stroke Patients Only)       Balance                                            Cognition Arousal/Alertness: Awake/alert Behavior During Therapy: WFL for tasks assessed/performed Overall Cognitive Status: Within Functional Limits for tasks  assessed                                        Exercises      General Comments General comments (skin integrity, edema, etc.): VSS, O2 sats 94%, HR 106 on .5 L supplemental O2; noted he was at rest in bed, trying to catch his breath after being up and around and bathed himself; Assisted pt in re-hooking up telemetry; discussed his incr Wrok of breathing with RN, and she instructed me to incr supplemental O2 to 2L      Pertinent Vitals/Pain Pain Assessment: Faces Faces Pain Scale: Hurts a little bit Pain Location: Chest Pain Descriptors / Indicators: Sore Pain Intervention(s): Monitored during session    Home Living                      Prior Function            PT Goals (current goals can now be found in the care plan section) Acute Rehab PT Goals Patient Stated Goal: to get off Oxygen and be able to breathe PT Goal Formulation: With patient Time For  Goal Achievement: 01/19/19 Potential to Achieve Goals: Fair Progress towards PT goals: Not progressing toward goals - comment(fatigued post bath today)    Frequency    Min 3X/week      PT Plan Current plan remains appropriate    Co-evaluation              AM-PAC PT "6 Clicks" Mobility   Outcome Measure  Help needed turning from your back to your side while in a flat bed without using bedrails?: None Help needed moving from lying on your back to sitting on the side of a flat bed without using bedrails?: None Help needed moving to and from a bed to a chair (including a wheelchair)?: None Help needed standing up from a chair using your arms (e.g., wheelchair or bedside chair)?: A Little Help needed to walk in hospital room?: A Little Help needed climbing 3-5 steps with a railing? : A Little 6 Click Score: 21    End of Session Equipment Utilized During Treatment: Oxygen Activity Tolerance: Patient limited by fatigue Patient left: in bed;with call bell/phone within reach;with bed alarm  set;with family/visitor present Nurse Communication: Mobility status PT Visit Diagnosis: Other abnormalities of gait and mobility (R26.89);Difficulty in walking, not elsewhere classified (R26.2);Muscle weakness (generalized) (M62.81);Pain     Time: 1411-1435 PT Time Calculation (min) (ACUTE ONLY): 24 min  Charges:  $Therapeutic Activity: 8-22 mins $Self Care/Home Management: [place], [place] Pager [phone] Office [place] 01/06/2019, 3:30 PM

## 2019-01-06 NOTE — Progress Notes (Addendum)
      [address]       [place] [address]             [phone]      Subjective:  Feels like he is breathing better.  Does have some dizziness at times.  Getting ready to go down for xray  Objective: Vital signs in last 24 hours: Temp:  [98.2 F (36.8 C)-98.8 F (37.1 C)] 98.6 F (37 C) (08/11 0736) Pulse Rate:  [101-115] 102 (08/11 0631) Cardiac Rhythm: Sinus tachycardia (08/10 1900) Resp:  [20-31] 26 (08/11 0736) BP: (112-121)/(67-98) 112/98 (08/11 0736) SpO2:  [92 %-97 %] 93 % (08/11 0736) Weight:  [id number] kg] 107 kg (08/11 0356)  Intake/Output from previous day: 08/10 0701 - 08/11 0700 In: 705 [P.O.:702; I.V.:3] Out: 2050 [Urine:2050]  General appearance: alert, cooperative and no distress Heart: regular rate and rhythm Lungs: diminished breath sounds right base  Lab Results: Recent Labs    01/05/19 0445 01/06/19 0436  WBC 8.1 7.6  HGB 7.8* 8.0*  HCT 25.8* 25.5*  PLT 539* 551*   BMET:  Recent Labs    01/05/19 0445 01/06/19 0436  NA 136 137  K 3.9 4.2  CL 101 102  CO2 24 25  GLUCOSE 138* 126*  BUN 15 12  CREATININE 1.47* 1.35*  CALCIUM 8.3* 8.6*    PT/INR: No results for input(s): LABPROT, INR in the last 72 hours. ABG    Component Value Date/Time   PHART 7.340 (L) 12/16/2018 1637   HCO3 24.9 12/16/2018 1637   TCO2 23 12/16/2018 2134   ACIDBASEDEF 1.0 12/16/2018 1637   O2SAT 95.0 12/16/2018 1637   CBG (last 3)  Recent Labs    01/05/19 1656 01/05/19 2111 01/06/19 0737  GLUCAP 104* 133* 101*    Assessment/Plan:  1. Right Pleural effusion- S/p Thoracentesis- small effusion persists-- on Lasix 2. CV- NSR, on Lopressor 3. Dispo- care per medicine, Dr. Roxy Manns to review CXR and make decision regarding possible Pleur-x catheter  LOS: 2 days    Erin Barrett 01/06/2019   I have seen and examined the patient and agree with the assessment and plan as outlined.  CXR unchanged from yesterday with small residual right pleural  effusion and RLL atelectasis.  No indications for Pleur-X catheter placement at this time, but will consider if effusion re accumulates again.  Not sure why V/Q scan was done - CTA would be necessary to r/o PE in a patient with pre-existing significant pleural effusion and atelectasis.  If patient is to be discharged home we will plan f/u visit in our office next Monday 8/17 with a CXR at that time.    Rexene Alberts, MD 01/06/2019 11:34 AM

## 2019-01-06 NOTE — Progress Notes (Signed)
PROGRESS NOTE    Charles Riggs.  YC:[id number] DOB: November 08, 1955 DOA: 01/04/2019 [facility]  Brief Narrative: 63 year old African-American male with history of chronic diastolic CHF, CKD stage III, who just underwent bioprosthetic aortic valve replacement on 12/16/2018, cath with clean coronaries in May, subsequently discharged home on diuretics. -Readmitted on 8/2 with dyspnea on exertion this was felt to be secondary to worsening anemia and right pleural effusion, he had 3 units of PRBC transfused and a thoracentesis, subsequently discharged home on Lasix for 5 days on 8/5.-Presented back to the ER 8/8 night with worsening dyspnea on exertion, hypoxia. -Hb stable but low, small to mod R Pleural effusion -Status post repeat thoracentesis 8/9 with 1.2 L of bloody pleural fluid drained -Also on low-dose diuretics, slowly improving  Assessment & Plan:   1.    Recurrent bloody pleural effusions  -Had 1.9 L of blood drained by IR on 8/3  -Yesterday 8/9 had 1.2 L of bloody pleural fluid drained again  -Recent AVR 7/21, appreciate Dr. Guy Sandifer input  -Plan for Pleurx catheter if this recurs, repeat x-ray today  2.  Acute hypoxic respiratory failure -I suspect this is secondary to above and anemia -Wean O2, status post thoracentesis 8/9 -VQ scan ordered on admission, without any ventilation/perfusion mismatch, Dopplers negative for DVT, clinically do not suspect acute PE -Echo last week post aVR was unremarkable -Ambulate, increase activity, physical therapy  3.  Acute on chronic anemia -Due to postop blood loss, and bloody effusions -Hemoglobin is stable since recent discharge -Anemia panel with severe iron deficiency, given IV iron 8/10, add oral iron at discharge -No evidence of hemolysis bilirubin is normal  4.  Status post recent AVR 7/21 -Stable -Dr.Owen following  5. Chronic systolic and diastolic CHF -Does not appear overtly volume overloaded to me, EF is 45%  on recent echo -Appears close to euvolemic, continue oral Lasix -Follow-up with Dr. Virgina Jock, I had notified him of patient's admission  6.  AKI on CKD 3 -Creatinine slightly higher than baseline at 2.2 yesterday, now down to 1.4 which is his baseline  7. OSA -CPAP nightly  DVT prophylaxis: Lovenox Code Status: Full code Family Communication: No family at bedside Disposition Plan: Home tomorrow if stable  Consultants:   TCTS   Procedures:   Antimicrobials:    Subjective: -Breathing better overall, was dizzy today with activity  Objective: Vitals:   01/06/19 0356 01/06/19 0631 01/06/19 0736 01/06/19 0920  BP: 116/85  (!) 112/98 128/74  Pulse: (!) 104 (!) 102    Resp: (!) 24 20 (!) 26   Temp: 98.7 F (37.1 C)  98.6 F (37 C)   TempSrc: Oral  Oral   SpO2: 92% 97% 93%   Weight: 107 kg     Height:        Intake/Output Summary (Last 24 hours) at 01/06/2019 1332 Last data filed at 01/06/2019 0700 Gross per 24 hour  Intake 225 ml  Output 1800 ml  Net -1575 ml   Filed Weights   01/04/19 0550 01/05/19 0557 01/06/19 0356  Weight: 107.1 kg 107 kg 107 kg    Examination: Gen: Awake, Alert, Oriented X 3, sitting up in bed, no distress HEENT: PERRLA, Neck supple, no JVD Lungs: Decreased breath sounds at both bases, otherwise clear  CVS: S1-S2/tachycardic Abd: soft, Non tender, non distended, BS present Extremities: No edema, ankle bracelet Skin: no new rashes Psychiatry: Judgement and insight appear normal. Mood & affect appropriate.     Data  Reviewed:   CBC: Recent Labs  Lab 12/31/18 0451 01/04/19 0053 01/04/19 0722 01/05/19 0445 01/06/19 0436  WBC 9.2 10.8* 8.1 8.1 7.6  NEUTROABS  --  7.7 5.6  --   --   HGB 7.9* 8.6* 7.8* 7.8* 8.0*  HCT 25.1* 28.4* 25.4* 25.8* 25.5*  MCV 81.2 81.6 79.1* 79.6* 79.4*  PLT 458* 591* 558* 539* Q000111Q*   Basic Metabolic Panel: Recent Labs  Lab 01/04/19 0053 01/05/19 0445 01/06/19 0436  NA 142 136 137  K 4.4 3.9  4.2  CL 105 101 102  CO2 23 24 25   GLUCOSE 146* 138* 126*  BUN 25* 15 12  CREATININE 2.28* 1.47* 1.35*  CALCIUM 9.0 8.3* 8.6*   GFR: Estimated Creatinine Clearance: 71.5 mL/min (A) (by C-G formula based on SCr of 1.35 mg/dL (H)). Liver Function Tests: Recent Labs  Lab 01/04/19 0053  AST 16  ALT 14  ALKPHOS 59  BILITOT 0.8  PROT 6.8  ALBUMIN 2.9*   No results for input(s): LIPASE, AMYLASE in the last 168 hours. No results for input(s): AMMONIA in the last 168 hours. Coagulation Profile: No results for input(s): INR, PROTIME in the last 168 hours. Cardiac Enzymes: No results for input(s): CKTOTAL, CKMB, CKMBINDEX, TROPONINI in the last 168 hours. BNP (last 3 results) No results for input(s): PROBNP in the last 8760 hours. HbA1C: No results for input(s): HGBA1C in the last 72 hours. CBG: Recent Labs  Lab 01/05/19 1154 01/05/19 1656 01/05/19 2111 01/06/19 0737 01/06/19 1154  GLUCAP 93 104* 133* 101* 101*   Lipid Profile: No results for input(s): CHOL, HDL, LDLCALC, TRIG, CHOLHDL, LDLDIRECT in the last 72 hours. Thyroid Function Tests: No results for input(s): TSH, T4TOTAL, FREET4, T3FREE, THYROIDAB in the last 72 hours. Anemia Panel: Recent Labs    01/04/19 1225  VITAMINB12 394  FOLATE 23.3  FERRITIN 133  TIBC 293  IRON 11*  RETICCTPCT 2.4   Urine analysis:    Component Value Date/Time   COLORURINE YELLOW 12/11/2018 1130   APPEARANCEUR CLEAR 12/11/2018 1130   LABSPEC 1.030 12/11/2018 1130   PHURINE 5.0 12/11/2018 1130   GLUCOSEU NEGATIVE 12/11/2018 1130   HGBUR NEGATIVE 12/11/2018 1130   HGBUR large 01/05/2009 1309   BILIRUBINUR MODERATE (A) 12/11/2018 1130   KETONESUR NEGATIVE 12/11/2018 1130   PROTEINUR NEGATIVE 12/11/2018 1130   UROBILINOGEN 0.2 01/05/2009 1309   NITRITE NEGATIVE 12/11/2018 1130   LEUKOCYTESUR NEGATIVE 12/11/2018 1130   Sepsis Labs: @LABRCNTIP (procalcitonin:4,lacticidven:4)  ) Recent Results (from the past 240 hour(s))   SARS CORONAVIRUS 2 Nasal Swab Aptima Multi Swab     Status: None   Collection Time: 12/28/18  4:39 PM   Specimen: Aptima Multi Swab; Nasal Swab  Result Value Ref Range Status   SARS Coronavirus 2 NEGATIVE NEGATIVE Final    Comment: (NOTE) SARS-CoV-2 target nucleic acids are NOT DETECTED. The SARS-CoV-2 RNA is generally detectable in upper and lower respiratory specimens during the acute phase of infection. Negative results do not preclude SARS-CoV-2 infection, do not rule out co-infections with other pathogens, and should not be used as the sole basis for treatment or other patient management decisions. Negative results must be combined with clinical observations, patient history, and epidemiological information. The expected result is Negative. Fact Sheet for Patients: SugarRoll.be Fact Sheet for Healthcare Providers: https://www.woods-mathews.com/ This test is not yet approved or cleared by the [place] FDA and  has been authorized for detection and/or diagnosis of SARS-CoV-2 by FDA under an Emergency Use Authorization (EUA).  This EUA will remain  in effect (meaning this test can be used) for the duration of the COVID-19 declaration under Section 56 4(b)(1) of the Act, 21 U.S.C. section 360bbb-3(b)(1), unless the authorization is terminated or revoked sooner. Performed at [facility], [place] [address] [place], [place] [address]   Gram stain     Status: None   Collection Time: 12/29/18 10:23 AM   Specimen: Pleural, Right; Pleural Fluid  Result Value Ref Range Status   Specimen Description PLEURAL RIGHT  Final   Special Requests NONE  Final   Gram Stain   Final    RARE WBC PRESENT,BOTH PMN AND MONONUCLEAR NO ORGANISMS SEEN Performed at [facility], [place] [address], [place], [place] [address]    Report Status 12/29/2018 FINAL  Final  Culture, body fluid-bottle     Status: None   Collection Time: 12/29/18 10:23 AM    Specimen: Pleura  Result Value Ref Range Status   Specimen Description PLEURAL RIGHT  Final   Special Requests NONE  Final   Culture   Final    NO GROWTH 5 DAYS Performed at [facility] [address], [place], [place] [address]    Report Status 01/03/2019 FINAL  Final  SARS Coronavirus 2 [facility] order, Performed in [facility] hospital lab) Nasopharyngeal Nasopharyngeal Swab     Status: None   Collection Time: 01/04/19  1:50 AM   Specimen: Nasopharyngeal Swab  Result Value Ref Range Status   SARS Coronavirus 2 NEGATIVE NEGATIVE Final    Comment: (NOTE) If result is NEGATIVE SARS-CoV-2 target nucleic acids are NOT DETECTED. The SARS-CoV-2 RNA is generally detectable in upper and lower  respiratory specimens during the acute phase of infection. The lowest  concentration of SARS-CoV-2 viral copies this assay can detect is 250  copies / mL. A negative result does not preclude SARS-CoV-2 infection  and should not be used as the sole basis for treatment or other  patient management decisions.  A negative result may occur with  improper specimen collection / handling, submission of specimen other  than nasopharyngeal swab, presence of viral mutation(s) within the  areas targeted by this assay, and inadequate number of viral copies  (<250 copies / mL). A negative result must be combined with clinical  observations, patient history, and epidemiological information. If result is POSITIVE SARS-CoV-2 target nucleic acids are DETECTED. The SARS-CoV-2 RNA is generally detectable in upper and lower  respiratory specimens dur ing the acute phase of infection.  Positive  results are indicative of active infection with SARS-CoV-2.  Clinical  correlation with patient history and other diagnostic information is  necessary to determine patient infection status.  Positive results do  not rule out bacterial infection or co-infection with other viruses. If result is PRESUMPTIVE POSTIVE  SARS-CoV-2 nucleic acids MAY BE PRESENT.   A presumptive positive result was obtained on the submitted specimen  and confirmed on repeat testing.  While 2019 novel coronavirus  (SARS-CoV-2) nucleic acids may be present in the submitted sample  additional confirmatory testing may be necessary for epidemiological  and / or clinical management purposes  to differentiate between  SARS-CoV-2 and other Sarbecovirus currently known to infect humans.  If clinically indicated additional testing with an alternate test  methodology [phone]) is advised. The SARS-CoV-2 RNA is generally  detectable in upper and lower respiratory sp ecimens during the acute  phase of infection. The expected result is Negative. Fact Sheet for Patients:  StrictlyIdeas.no  Fact Sheet for Healthcare Providers: BankingDealers.co.za This test is not yet approved or cleared by the [place] FDA and has been authorized for detection and/or diagnosis of SARS-CoV-2 by FDA under an Emergency Use Authorization (EUA).  This EUA will remain in effect (meaning this test can be used) for the duration of the COVID-19 declaration under Section 564(b)(1) of the Act, 21 U.S.C. section 360bbb-3(b)(1), unless the authorization is terminated or revoked sooner. Performed at [facility], [place] [address] [place], [place] [address]   MRSA PCR Screening     Status: None   Collection Time: 01/04/19  6:15 AM   Specimen: Nasopharyngeal  Result Value Ref Range Status   MRSA by PCR NEGATIVE NEGATIVE Final    Comment:        The GeneXpert MRSA Assay (FDA approved for NASAL specimens only), is one component of a comprehensive MRSA colonization surveillance program. It is not intended to diagnose MRSA infection nor to guide or monitor treatment for MRSA infections. Performed at [facility], [place] [address] [place], [place] [address]   Culture, body fluid-bottle     Status:  None (Preliminary result)   Collection Time: 01/04/19 12:28 PM   Specimen: Pleura  Result Value Ref Range Status   Specimen Description PLEURAL RIGHT  Final   Special Requests NONE  Final   Culture   Final    NO GROWTH 2 DAYS Performed at [facility] [address], [place], [place] [address]    Report Status PENDING  Incomplete  Gram stain     Status: None   Collection Time: 01/04/19 12:28 PM   Specimen: Pleura  Result Value Ref Range Status   Specimen Description PLEURAL RIGHT  Final   Special Requests NONE  Final   Gram Stain   Final    RARE WBC PRESENT,BOTH PMN AND MONONUCLEAR NO ORGANISMS SEEN Performed at [facility], [address], [place], [place] [address]    Report Status 01/04/2019 FINAL  Final         Radiology Studies: Dg Chest 2 View  Result Date: 01/06/2019 CLINICAL DATA:  Pleural effusion EXAM: CHEST - 2 VIEW COMPARISON:  January 04, 2019 FINDINGS: There is a persistent right pleural effusion with atelectatic change in the right mid lung. Postoperative change noted on the right. Left lung is clear. Heart is upper normal in size with pulmonary vascularity normal. Patient is status post aortic valve replacement. No appreciable pneumothorax. Status post median sternotomy. IMPRESSION: Essentially stable right pleural effusion with areas of atelectatic change on the right. Postoperative change noted on the right. Left lung clear. Stable cardiac silhouette. Status post aortic valve replacement. Electronically Signed   By: Lowella Grip III M.D.   On: 01/06/2019 09:47        Scheduled Meds: . acetaminophen  500 mg Oral TID  . aspirin EC  81 mg Oral Daily  . dextromethorphan-guaiFENesin  1 tablet Oral BID  . ferrous Q000111Q C-folic acid  1 capsule Oral TID PC  . furosemide  40 mg Oral Daily  . gabapentin  300 mg Oral BID  . hydrOXYzine  25 mg Oral TID  . insulin aspart  0-5 Units Subcutaneous QHS  . insulin aspart  0-9 Units  Subcutaneous TID WC  . metoprolol tartrate  25 mg Oral BID  . mometasone-formoterol  2 puff Inhalation BID  . montelukast  10 mg Oral QPM  . pantoprazole  40 mg Oral Daily  .  potassium chloride  20 mEq Oral Daily  . rosuvastatin  10 mg Oral q1800  . sodium chloride flush  3 mL Intravenous Q12H   Continuous Infusions: . sodium chloride       LOS: 2 days    Time spent: 29min    Domenic Polite, MD [facility]   01/06/2019, 1:32 PM

## 2019-01-06 NOTE — Social Work (Addendum)
11:40am- [facility] spoke with [facility]. Pt MD is Dr. Oletta Darter at the [facility].  His CSW is Kellie Simmering [phone]; office phone [phone] ext [phone].   11:21am- Acknowledging recommendation for [facility] and need for pt to have oxygen. Message left for [facility] care coordinator at [phone] ext [phone].  Westley Hummer, MSW, [place] Work [phone]

## 2019-01-07 ENCOUNTER — Inpatient Hospital Stay (HOSPITAL_COMMUNITY): Payer: No Typology Code available for payment source

## 2019-01-07 DIAGNOSIS — I129 Hypertensive chronic kidney disease with stage 1 through stage 4 chronic kidney disease, or unspecified chronic kidney disease: Secondary | ICD-10-CM

## 2019-01-07 DIAGNOSIS — J942 Hemothorax: Secondary | ICD-10-CM

## 2019-01-07 DIAGNOSIS — I5033 Acute on chronic diastolic (congestive) heart failure: Secondary | ICD-10-CM

## 2019-01-07 DIAGNOSIS — N183 Chronic kidney disease, stage 3 (moderate): Secondary | ICD-10-CM

## 2019-01-07 LAB — BASIC METABOLIC PANEL
Anion gap: 11 (ref 5–15)
BUN: 9 mg/dL (ref 8–23)
CO2: 25 mmol/L (ref 22–32)
Calcium: 8.7 mg/dL — ABNORMAL LOW (ref 8.9–10.3)
Chloride: 103 mmol/L (ref 98–111)
Creatinine, Ser: 1.37 mg/dL — ABNORMAL HIGH (ref 0.61–1.24)
GFR calc Af Amer: >60 mL/min (ref >60)
GFR calc non Af Amer: 55 mL/min — ABNORMAL LOW (ref >60)
Glucose, Bld: 108 mg/dL — ABNORMAL HIGH (ref 70–99)
Potassium: 4.1 mmol/L (ref 3.5–5.1)
Sodium: 139 mmol/L (ref 135–145)

## 2019-01-07 LAB — GLUCOSE, CAPILLARY
Glucose-Capillary: 101 mg/dL — ABNORMAL HIGH (ref 70–99)
Glucose-Capillary: 131 mg/dL — ABNORMAL HIGH (ref 70–99)

## 2019-01-07 MED ORDER — METOPROLOL TARTRATE 50 MG PO TABS: 50.0000 mg | ORAL_TABLET | Freq: Two times a day (BID) | ORAL | Status: DC

## 2019-01-07 MED ORDER — LOSARTAN POTASSIUM 25 MG PO TABS: 25.0000 mg | ORAL_TABLET | Freq: Every day | ORAL | 2 refills | Status: DC

## 2019-01-07 MED ORDER — METOPROLOL TARTRATE 50 MG PO TABS: 50.0000 mg | ORAL_TABLET | Freq: Two times a day (BID) | ORAL | 2 refills | Status: DC

## 2019-01-07 MED ORDER — LOSARTAN POTASSIUM 25 MG PO TABS
25.0000 mg | ORAL_TABLET | Freq: Every day | ORAL | Status: DC
  Administered 2019-01-07: 25 mg via ORAL
  Filled 2019-01-07: qty 1

## 2019-01-07 NOTE — Discharge Summary (Signed)
Physician Discharge Summary  Charles Riggs. RY:[id number] DOB: 1956/03/14 DOA: 01/04/2019  [facility]  Admit date: 01/04/2019 Discharge date: 01/07/2019  Admitted From: Home Disposition:  Home  Recommendations for Outpatient Follow-up:  1. [facility] week 2. Follow up with Dr. Roxy Manns on 8/17  Home Health: PT   Equipment/Devices: Rolling walker    Discharge Condition: Stable CODE STATUS: Full code Diet recommendation: Heart healthy  Brief/Interim Summary: Charles Pelegrin. is a 63 year old African-American male with history of chronic diastolic CHF, CKD stage III, who just underwent bioprosthetic aortic valve replacement on 12/16/2018, cath with clean coronaries in May, subsequently discharged home on diuretics.  He was readmitted on 8/2 with dyspnea on exertion; this was felt to be secondary to worsening anemia and right pleural effusion. He had 3 units of PRBC transfused and thoracentesis, subsequently discharged home on Lasix for 5 days on 8/5.  He presented back to the ER on 8/8 night with worsening dyspnea on exertion, hypoxia.  He underwent repeat thoracentesis 8/9 with 1.2 L of bloody pleural fluid drained.  He was also maintained on diuretic.  Cardiothoracic surgery has been following patient, plan to follow-up with a repeat chest x-ray next week in office and possible consideration of Pleurx catheter.  This morning, he is feeling well without any complaints of worsening chest pain, worsening shortness of breath.  Discharge Diagnoses:  Principal Problem:   Acute respiratory failure with hypoxemia [facility]) Active Problems:   Asthma   GERD   Personal history presenting hazards to health   Nonrheumatic aortic valve insufficiency   Essential hypertension   Obstructive sleep apnea   Chronic kidney disease (CKD)   Recurrent bloody pleural effusions  -S/p thoracentesis -1.9 L of bloody pleural fluid drained on 8/3  -S/p thoracentesis -1.2 L of  bloody pleural fluid drained on 8/9  -Recent AVR 7/21, appreciate Dr. Guy Sandifer input  -Follow-up with Dr. Zenia Resides as an outpatient for repeat chest x-ray and possible consideration for Pleurx catheter  Acute hypoxic respiratory failure -VQ scan ordered on admission, without any ventilation/perfusion mismatch, Dopplers negative for DVT, clinically do not suspect acute PE -Echo last week post aVR was unremarkable -Home oxygen saturation screening  Acute on chronic anemia -Due to postop blood loss, and bloody effusions -Hemoglobin is stable since recent discharge -Anemia panel with severe iron deficiency, given IV iron 8/10, add oral iron at discharge -No evidence of hemolysis bilirubin is normal -Stable Hgb 8.0   Status post recent AVR 7/21 -Stable -Dr.Owen following  Chronic systolic and diastolic CHF -Does not appear overtly volume overloaded to me, EF is 45% on recent echo -Appears close to euvolemic, continue oral Lasix -Follow-up with Dr. Virgina Jock, he had been notified of patient's admission  AKI on CKD 3 -Stable Cr 1.37   OSA -CPAP nightly  Discharge Instructions  Discharge Instructions    (HEART FAILURE PATIENTS) Call MD:  Anytime you have any of the following symptoms: 1) 3 pound weight gain in 24 hours or 5 pounds in 1 week 2) shortness of breath, with or without a dry hacking cough 3) swelling in the hands, feet or stomach 4) if you have to sleep on extra pillows at night in order to breathe.   Complete by: As directed    Call MD for:  difficulty breathing, headache or visual disturbances   Complete by: As directed    Call MD for:  extreme fatigue   Complete by: As directed  Call MD for:  persistant dizziness or light-headedness   Complete by: As directed    Call MD for:  persistant nausea and vomiting   Complete by: As directed    Call MD for:  severe uncontrolled pain   Complete by: As directed    Call MD for:  temperature >100.4   Complete by: As  directed    Diet - low sodium heart healthy   Complete by: As directed    Discharge instructions   Complete by: As directed    You were cared for by a hospitalist during your hospital stay. If you have any questions about your discharge medications or the care you received while you were in the hospital after you are discharged, you can call the unit and ask to speak with the hospitalist on call if the hospitalist that took care of you is not available. Once you are discharged, your primary care physician will handle any further medical issues. Please note that NO REFILLS for any discharge medications will be authorized once you are discharged, as it is imperative that you return to your primary care physician (or establish a relationship with a primary care physician if you do not have one) for your aftercare needs so that they can reassess your need for medications and monitor your lab values.   Increase activity slowly   Complete by: As directed      Allergies as of 01/07/2019   No Known Allergies     Medication List    STOP taking these medications   oxyCODONE-acetaminophen 5-325 MG tablet Commonly known as: PERCOCET/ROXICET     TAKE these medications   acetaminophen 500 MG tablet Commonly known as: TYLENOL Take 500 mg by mouth 3 (three) times daily.   albuterol 108 (90 Base) MCG/ACT inhaler Commonly known as: VENTOLIN HFA Inhale 2 puffs into the lungs every 6 (six) hours as needed for wheezing or shortness of breath.   albuterol (2.5 MG/3ML) 0.083% nebulizer solution Commonly known as: PROVENTIL Take 3 mLs (2.5 mg total) by nebulization every 6 (six) hours as needed for wheezing or shortness of breath.   aspirin EC 81 MG tablet Take 81 mg by mouth daily.   budesonide-formoterol 160-4.5 MCG/ACT inhaler Commonly known as: SYMBICORT Inhale 2 puffs into the lungs 2 (two) times daily.   Combivent Respimat 20-100 MCG/ACT Aers respimat Generic drug:  Ipratropium-Albuterol Inhale 2 puffs into the lungs daily as needed for shortness of breath.   dextromethorphan-guaiFENesin 30-600 MG 12hr tablet Commonly known as: MUCINEX DM Take 1 tablet by mouth 2 (two) times daily.   ferrous Q000111Q C-folic acid capsule Commonly known as: TRINSICON / FOLTRIN Take 1 capsule by mouth 3 (three) times daily after meals.   fluticasone 50 MCG/ACT nasal spray Commonly known as: FLONASE Place 2 sprays into both nostrils daily as needed for allergies or rhinitis.   furosemide 40 MG tablet Commonly known as: Lasix Take 1 tablet (40 mg total) by mouth daily for 5 days.   gabapentin 400 MG capsule Commonly known as: NEURONTIN Take 400 mg by mouth every 8 (eight) hours.   hydrOXYzine 25 MG capsule Commonly known as: VISTARIL Take 25 mg by mouth 3 (three) times daily.   losartan 25 MG tablet Commonly known as: COZAAR Take 1 tablet (25 mg total) by mouth daily.   metoprolol tartrate 50 MG tablet Commonly known as: LOPRESSOR Take 1 tablet (50 mg total) by mouth 2 (two) times daily. What changed:   medication strength  how much to take   montelukast 10 MG tablet Commonly known as: SINGULAIR Take 10 mg by mouth every evening.   omeprazole 40 MG capsule Commonly known as: PRILOSEC TAKE ONE CAPSULE BY MOUTH DAILY   rosuvastatin 10 MG tablet Commonly known as: CRESTOR Take 1 tablet (10 mg total) by mouth daily at 6 PM.            Durable Medical Equipment  (From admission, onward)         Start     Ordered   01/07/19 1108  For home use only DME Walker rolling  [facility])  Once    Question:  Patient needs a walker to treat with the following condition  Answer:  Physical deconditioning   01/07/19 1108         Follow-up Information    Rexene Alberts, MD Follow up on 01/12/2019.   Specialty: Cardiothoracic Surgery Why: Appointment is 3:30, please get CXR at 3:00 at [facility] located on first floor of our  office building Contact information: [place] [address] [phone]          No Known Allergies  Consultations:  Cardiothoracic surgery   Procedures/Studies: Dg Chest 1 View  Result Date: 01/04/2019 CLINICAL DATA:  Status post right thoracentesis, cough EXAM: CHEST  1 VIEW COMPARISON:  Chest radiograph from earlier today. FINDINGS: Aortic valve prosthesis in place. Stable configuration of horizontal surgical plate with interlocking screws overlying mid to upper mediastinum. Stable cardiomediastinal silhouette with top-normal heart size. No pneumothorax. Small right pleural effusion is decreased. No left pleural effusion. Hazy right perihilar lung opacity is similar. Slightly improved aeration at the right lung base with persistent patchy right lung base opacity. Clear left lung. IMPRESSION: 1. No pneumothorax.  Small right pleural effusion is decreased. 2. Improved aeration at the right lung base with persistent patchy right lung base opacity, favor atelectasis. 3. Stable hazy right parahilar opacity, which could represent mild asymmetric pulmonary edema. Electronically Signed   By: Ilona Sorrel M.D.   On: 01/04/2019 12:45   Dg Chest 2 View  Result Date: 01/06/2019 CLINICAL DATA:  Pleural effusion EXAM: CHEST - 2 VIEW COMPARISON:  January 04, 2019 FINDINGS: There is a persistent right pleural effusion with atelectatic change in the right mid lung. Postoperative change noted on the right. Left lung is clear. Heart is upper normal in size with pulmonary vascularity normal. Patient is status post aortic valve replacement. No appreciable pneumothorax. Status post median sternotomy. IMPRESSION: Essentially stable right pleural effusion with areas of atelectatic change on the right. Postoperative change noted on the right. Left lung clear. Stable cardiac silhouette. Status post aortic valve replacement. Electronically Signed   By: Lowella Grip III M.D.   On:  01/06/2019 09:47   Nm Pulmonary Perf And Vent  Result Date: 01/04/2019 CLINICAL DATA:  Shortness of breath.  Post thoracentesis. EXAM: NUCLEAR MEDICINE VENTILATION - PERFUSION LUNG SCAN TECHNIQUE: Ventilation images were obtained in multiple projections using inhaled aerosol Tc-11m DTPA. Perfusion images were obtained in multiple projections after intravenous injection of Tc-60m MAA. RADIOPHARMACEUTICALS:  32.5 mCi of Tc-53m DTPA aerosol inhalation and 1.5 mCi Tc72m MAA IV COMPARISON:  Portable chest 01/04/2019 and 12/28/2018. FINDINGS: Ventilation: There is abnormal ventilation to both lungs with multiple small to moderate peripheral ventilation defects. Perfusion: There is abnormal perfusion of both lungs with multiple small to moderate perfusion defects. These are matched to the ventilatory defects. No ventilation perfusion mismatches are identified.  The recent radiographs demonstrate a residual right pleural effusion and right basilar pulmonary opacity. IMPRESSION: Multiple matched ventilatory and perfusion defects in both lungs. Findings are indeterminate for pulmonary embolism. Consider further evaluation with chest CTA with contrast. If patient unable to receive intravenous contrast, correlation with lower extremity Doppler ultrasound may be helpful. Electronically Signed   By: Richardean Sale M.D.   On: 01/04/2019 13:42   Dg Chest Port 1 View  Result Date: 01/04/2019 CLINICAL DATA:  63 year old male with recent bowel with placement presenting with shortness of breath. EXAM: PORTABLE CHEST 1 VIEW COMPARISON:  Chest radiograph dated 12/31/2018 and CT dated 12/28/2018 FINDINGS: Right-sided pleural effusion similar to prior radiograph with associated compressive atelectasis of the right lung base. Overall no significant interval change in the appearance of the lungs. The left lung is clear. No pneumothorax. Stable cardiac silhouette. Mechanical aortic bowel and sternal fixation plate. No acute osseous  pathology. IMPRESSION: No significant interval change in the right-sided pleural effusion and associated compressive atelectasis of the right lung base. Electronically Signed   By: Anner Crete M.D.   On: 01/04/2019 01:06    Vas [place] Lower Extremity Venous (dvt)  Result Date: 01/04/2019  Lower Venous Study Indications: Edema.  Comparison Study: no prior Performing Technologist: Abram Sander RVS  Examination Guidelines: A complete evaluation includes B-mode imaging, spectral Doppler, color Doppler, and power Doppler as needed of all accessible portions of each vessel. Bilateral testing is considered an integral part of a complete examination. Limited examinations for reoccurring indications may be performed as noted.  +---------+---------------+---------+-----------+----------+--------------+ RIGHT    CompressibilityPhasicitySpontaneityPropertiesSummary        +---------+---------------+---------+-----------+----------+--------------+ CFV      Full           Yes      Yes                                 +---------+---------------+---------+-----------+----------+--------------+ SFJ      Full                                                        +---------+---------------+---------+-----------+----------+--------------+ FV Prox  Full                                                        +---------+---------------+---------+-----------+----------+--------------+ FV Mid   Full                                                        +---------+---------------+---------+-----------+----------+--------------+ FV DistalFull                                                        +---------+---------------+---------+-----------+----------+--------------+ PFV      Full                                                        +---------+---------------+---------+-----------+----------+--------------+  POP      Full           Yes      Yes                                  +---------+---------------+---------+-----------+----------+--------------+ PTV      Full                                                        +---------+---------------+---------+-----------+----------+--------------+ PERO                                                  Not visualized +---------+---------------+---------+-----------+----------+--------------+   +---------+---------------+---------+-----------+----------+-------+ LEFT     CompressibilityPhasicitySpontaneityPropertiesSummary +---------+---------------+---------+-----------+----------+-------+ CFV      Full           Yes      Yes                          +---------+---------------+---------+-----------+----------+-------+ SFJ      Full                                                 +---------+---------------+---------+-----------+----------+-------+ FV Prox  Full                                                 +---------+---------------+---------+-----------+----------+-------+ FV Mid   Full                                                 +---------+---------------+---------+-----------+----------+-------+ FV DistalFull                                                 +---------+---------------+---------+-----------+----------+-------+ PFV      Full                                                 +---------+---------------+---------+-----------+----------+-------+ POP      Full           Yes      Yes                          +---------+---------------+---------+-----------+----------+-------+ PTV      Full                                                 +---------+---------------+---------+-----------+----------+-------+  PERO     Full                                                 +---------+---------------+---------+-----------+----------+-------+     Summary: Right: There is no evidence of deep vein thrombosis in the lower extremity. No cystic structure found in  the popliteal fossa. Left: There is no evidence of deep vein thrombosis in the lower extremity. No cystic structure found in the popliteal fossa.  *See table(s) above for measurements and observations. Electronically signed by Harold Barban MD on 01/04/2019 at 12:29:18 PM.    Final    US Thoracentesis Asp Pleural Space W/img Guide  Result Date: 01/04/2019 INDICATION: Patient s/p minimally invasive aortic valve replacement 12/16/2018 via right mini thoracotomy approach now with dyspnea and recurrent right pleural effusion. Request is made for therapeutic right thoracentesis. EXAM: ULTRASOUND GUIDED THERAPEUTIC RIGHT THORACENTESIS MEDICATIONS: 10 mL 1% lidocaine COMPLICATIONS: None immediate. PROCEDURE: An ultrasound guided thoracentesis was thoroughly discussed with the patient and questions answered. The benefits, risks, alternatives and complications were also discussed. The patient understands and wishes to proceed with the procedure. Written consent was obtained. Ultrasound was performed to localize and mark an adequate pocket of fluid in the right chest. The area was then prepped and draped in the normal sterile fashion. 1% Lidocaine was used for local anesthesia. Under ultrasound guidance a 6 Fr Safe-T-Centesis catheter was introduced. Thoracentesis was performed. The catheter was removed and a dressing applied. FINDINGS: A total of approximately 1.25 L of dark red fluid was removed. Specimen was sent for labs. IMPRESSION: Successful ultrasound guided right thoracentesis yielding 1.25 L of pleural fluid. Read by: Earley Abide, PA-C Electronically Signed   By: Corrie Mckusick D.O.   On: 01/04/2019 13:11     Discharge Exam: Vitals:   01/07/19 0748 01/07/19 0853  BP: 122/78   Pulse: (!) 101   Resp:    Temp: 98.8 F (37.1 C)   SpO2: 96% 95%    General: Pt is alert, awake, not in acute distress Cardiovascular: RRR, S1/S2 +, no edema Respiratory: Diminished breath sounds, no wheezing, no rhonchi, no  respiratory distress, no conversational dyspnea  Abdominal: Soft, NT, ND, bowel sounds + Extremities: no edema, no cyanosis Psych: Normal mood and affect, stable judgement and insight     The results of significant diagnostics from this hospitalization (including imaging, microbiology, ancillary and laboratory) are listed below for reference.     Microbiology: Recent Results (from the past 240 hour(s))  SARS CORONAVIRUS 2 Nasal Swab Aptima Multi Swab     Status: None   Collection Time: 12/28/18  4:39 PM   Specimen: Aptima Multi Swab; Nasal Swab  Result Value Ref Range Status   SARS Coronavirus 2 NEGATIVE NEGATIVE Final    Comment: (NOTE) SARS-CoV-2 target nucleic acids are NOT DETECTED. The SARS-CoV-2 RNA is generally detectable in upper and lower respiratory specimens during the acute phase of infection. Negative results do not preclude SARS-CoV-2 infection, do not rule out co-infections with other pathogens, and should not be used as the sole basis for treatment or other patient management decisions. Negative results must be combined with clinical observations, patient history, and epidemiological information. The expected result is Negative. Fact Sheet for Patients: SugarRoll.be Fact Sheet for Healthcare Providers: https://www.woods-mathews.com/ This test is not yet approved or cleared by the [place] FDA  and  has been authorized for detection and/or diagnosis of SARS-CoV-2 by FDA under an Emergency Use Authorization (EUA). This EUA will remain  in effect (meaning this test can be used) for the duration of the COVID-19 declaration under Section 56 4(b)(1) of the Act, 21 U.S.C. section 360bbb-3(b)(1), unless the authorization is terminated or revoked sooner. Performed at [facility], [place] [address] [place], [place] [address]   Gram stain     Status: None   Collection Time: 12/29/18 10:23 AM   Specimen: Pleural, Right;  Pleural Fluid  Result Value Ref Range Status   Specimen Description PLEURAL RIGHT  Final   Special Requests NONE  Final   Gram Stain   Final    RARE WBC PRESENT,BOTH PMN AND MONONUCLEAR NO ORGANISMS SEEN Performed at [facility], [place] [address], [place], [place] [address]    Report Status 12/29/2018 FINAL  Final  Culture, body fluid-bottle     Status: None   Collection Time: 12/29/18 10:23 AM   Specimen: Pleura  Result Value Ref Range Status   Specimen Description PLEURAL RIGHT  Final   Special Requests NONE  Final   Culture   Final    NO GROWTH 5 DAYS Performed at [facility] [address], [place],  [address]    Report Status 01/03/2019 FINAL  Final  SARS Coronavirus 2 [facility] order, Performed in [facility] hospital lab) Nasopharyngeal Nasopharyngeal Swab     Status: None   Collection Time: 01/04/19  1:50 AM   Specimen: Nasopharyngeal Swab  Result Value Ref Range Status   SARS Coronavirus 2 NEGATIVE NEGATIVE Final    Comment: (NOTE) If result is NEGATIVE SARS-CoV-2 target nucleic acids are NOT DETECTED. The SARS-CoV-2 RNA is generally detectable in upper and lower  respiratory specimens during the acute phase of infection. The lowest  concentration of SARS-CoV-2 viral copies this assay can detect is 250  copies / mL. A negative result does not preclude SARS-CoV-2 infection  and should not be used as the sole basis for treatment or other  patient management decisions.  A negative result may occur with  improper specimen collection / handling, submission of specimen other  than nasopharyngeal swab, presence of viral mutation(s) within the  areas targeted by this assay, and inadequate number of viral copies  (<250 copies / mL). A negative result must be combined with clinical  observations, patient history, and epidemiological information. If result is POSITIVE SARS-CoV-2 target nucleic acids are DETECTED. The SARS-CoV-2 RNA is generally detectable  in upper and lower  respiratory specimens dur ing the acute phase of infection.  Positive  results are indicative of active infection with SARS-CoV-2.  Clinical  correlation with patient history and other diagnostic information is  necessary to determine patient infection status.  Positive results do  not rule out bacterial infection or co-infection with other viruses. If result is PRESUMPTIVE POSTIVE SARS-CoV-2 nucleic acids MAY BE PRESENT.   A presumptive positive result was obtained on the submitted specimen  and confirmed on repeat testing.  While 2019 novel coronavirus  (SARS-CoV-2) nucleic acids may be present in the submitted sample  additional confirmatory testing may be necessary for epidemiological  and / or clinical management purposes  to differentiate between  SARS-CoV-2 and other Sarbecovirus currently known to infect humans.  If clinically indicated additional testing with an alternate test  methodology [phone]) is advised. The SARS-CoV-2 RNA is generally  detectable in upper and lower respiratory sp  ecimens during the acute  phase of infection. The expected result is Negative. Fact Sheet for Patients:  StrictlyIdeas.no Fact Sheet for Healthcare Providers: BankingDealers.co.za This test is not yet approved or cleared by the [place] FDA and has been authorized for detection and/or diagnosis of SARS-CoV-2 by FDA under an Emergency Use Authorization (EUA).  This EUA will remain in effect (meaning this test can be used) for the duration of the COVID-19 declaration under Section 564(b)(1) of the Act, 21 U.S.C. section 360bbb-3(b)(1), unless the authorization is terminated or revoked sooner. Performed at [facility], [place] [address] [place], [place] [address]   MRSA PCR Screening     Status: None   Collection Time: 01/04/19  6:15 AM   Specimen: Nasopharyngeal  Result Value Ref Range Status   MRSA by PCR  NEGATIVE NEGATIVE Final    Comment:        The GeneXpert MRSA Assay (FDA approved for NASAL specimens only), is one component of a comprehensive MRSA colonization surveillance program. It is not intended to diagnose MRSA infection nor to guide or monitor treatment for MRSA infections. Performed at [facility], [place] [address], [place], [place] [address]   Culture, body fluid-bottle     Status: None (Preliminary result)   Collection Time: 01/04/19 12:28 PM   Specimen: Pleura  Result Value Ref Range Status   Specimen Description PLEURAL RIGHT  Final   Special Requests NONE  Final   Culture   Final    NO GROWTH 2 DAYS Performed at [facility] [address], [place], [place] [address]    Report Status PENDING  Incomplete  Gram stain     Status: None   Collection Time: 01/04/19 12:28 PM   Specimen: Pleura  Result Value Ref Range Status   Specimen Description PLEURAL RIGHT  Final   Special Requests NONE  Final   Gram Stain   Final    RARE WBC PRESENT,BOTH PMN AND MONONUCLEAR NO ORGANISMS SEEN Performed at [facility], [address], [place],  [id number]    Report Status 01/04/2019 FINAL  Final     Labs: BNP (last 3 results) Recent Labs    08/27/18 0604 12/28/18 1618 01/04/19 0053  BNP 304.6* 161.7* [id number]*   Basic Metabolic Panel: Recent Labs  Lab 01/04/19 0053 01/05/19 0445 01/06/19 0436 01/07/19 0404  NA 142 136 137 139  K 4.4 3.9 4.2 4.1  CL 105 101 102 103  CO2 23 24 25 25   GLUCOSE 146* 138* 126* 108*  BUN 25* 15 12 9   CREATININE 2.28* 1.47* 1.35* 1.37*  CALCIUM 9.0 8.3* 8.6* 8.7*   Liver Function Tests: Recent Labs  Lab 01/04/19 0053  AST 16  ALT 14  ALKPHOS 59  BILITOT 0.8  PROT 6.8  ALBUMIN 2.9*   No results for input(s): LIPASE, AMYLASE in the last 168 hours. No results for input(s): AMMONIA in the last 168 hours. CBC: Recent Labs  Lab 01/04/19 0053 01/04/19 0722 01/05/19 0445 01/06/19 0436  WBC 10.8* 8.1  8.1 7.6  NEUTROABS 7.7 5.6  --   --   HGB 8.6* 7.8* 7.8* 8.0*  HCT 28.4* 25.4* 25.8* 25.5*  MCV 81.6 79.1* 79.6* 79.4*  PLT 591* 558* 539* 551*   Cardiac Enzymes: No results for input(s): CKTOTAL, CKMB, CKMBINDEX, TROPONINI in the last 168 hours. BNP: Invalid input(s): POCBNP CBG: Recent Labs  Lab 01/06/19 0737 01/06/19 1154 01/06/19 1715 01/06/19 2043 01/07/19 [id number]  GLUCAP 101* 101* 153* 84 101*   D-Dimer No results for input(s): DDIMER in the last 72 hours. Hgb A1c No results for input(s): HGBA1C in the last 72 hours. Lipid Profile No results for input(s): CHOL, HDL, LDLCALC, TRIG, CHOLHDL, LDLDIRECT in the last 72 hours. Thyroid function studies No results for input(s): TSH, T4TOTAL, T3FREE, THYROIDAB in the last 72 hours.  Invalid input(s): FREET3 Anemia work up Recent Labs    01/04/19 1225  VITAMINB12 394  FOLATE 23.3  FERRITIN 133  TIBC 293  IRON 11*  RETICCTPCT 2.4   Urinalysis    Component Value Date/Time   COLORURINE YELLOW 12/11/2018 1130   APPEARANCEUR CLEAR 12/11/2018 1130   LABSPEC 1.030 12/11/2018 1130   PHURINE 5.0 12/11/2018 1130   GLUCOSEU NEGATIVE 12/11/2018 1130   HGBUR NEGATIVE 12/11/2018 1130   HGBUR large 01/05/2009 1309   BILIRUBINUR MODERATE (A) 12/11/2018 1130   KETONESUR NEGATIVE 12/11/2018 1130   PROTEINUR NEGATIVE 12/11/2018 1130   UROBILINOGEN 0.2 01/05/2009 1309   NITRITE NEGATIVE 12/11/2018 1130   LEUKOCYTESUR NEGATIVE 12/11/2018 1130   Sepsis Labs Invalid input(s): PROCALCITONIN,  WBC,  LACTICIDVEN Microbiology Recent Results (from the past 240 hour(s))  SARS CORONAVIRUS 2 Nasal Swab Aptima Multi Swab     Status: None   Collection Time: 12/28/18  4:39 PM   Specimen: Aptima Multi Swab; Nasal Swab  Result Value Ref Range Status   SARS Coronavirus 2 NEGATIVE NEGATIVE Final    Comment: (NOTE) SARS-CoV-2 target nucleic acids are NOT DETECTED. The SARS-CoV-2 RNA is generally detectable in upper and lower respiratory  specimens during the acute phase of infection. Negative results do not preclude SARS-CoV-2 infection, do not rule out co-infections with other pathogens, and should not be used as the sole basis for treatment or other patient management decisions. Negative results must be combined with clinical observations, patient history, and epidemiological information. The expected result is Negative. Fact Sheet for Patients: SugarRoll.be Fact Sheet for Healthcare Providers: https://www.woods-mathews.com/ This test is not yet approved or cleared by the [place] FDA and  has been authorized for detection and/or diagnosis of SARS-CoV-2 by FDA under an Emergency Use Authorization (EUA). This EUA will remain  in effect (meaning this test can be used) for the duration of the COVID-19 declaration under Section 56 4(b)(1) of the Act, 21 U.S.C. section 360bbb-3(b)(1), unless the authorization is terminated or revoked sooner. Performed at [facility], [place] [address] [place], [place] [address]   Gram stain     Status: None   Collection Time: 12/29/18 10:23 AM   Specimen: Pleural, Right; Pleural Fluid  Result Value Ref Range Status   Specimen Description PLEURAL RIGHT  Final   Special Requests NONE  Final   Gram Stain   Final    RARE WBC PRESENT,BOTH PMN AND MONONUCLEAR NO ORGANISMS SEEN Performed at [facility], [place] [address], [place], [place] [address]    Report Status 12/29/2018 FINAL  Final  Culture, body fluid-bottle     Status: None   Collection Time: 12/29/18 10:23 AM   Specimen: Pleura  Result Value Ref Range Status   Specimen Description PLEURAL RIGHT  Final   Special Requests NONE  Final   Culture   Final    NO GROWTH 5 DAYS Performed at [place] [address], [place], [place] [address]    Report Status 01/03/2019 FINAL  Final  SARS Coronavirus 2 [facility] order, Performed in [facility] hospital lab)  Nasopharyngeal  Nasopharyngeal Swab     Status: None   Collection Time: 01/04/19  1:50 AM   Specimen: Nasopharyngeal Swab  Result Value Ref Range Status   SARS Coronavirus 2 NEGATIVE NEGATIVE Final    Comment: (NOTE) If result is NEGATIVE SARS-CoV-2 target nucleic acids are NOT DETECTED. The SARS-CoV-2 RNA is generally detectable in upper and lower  respiratory specimens during the acute phase of infection. The lowest  concentration of SARS-CoV-2 viral copies this assay can detect is 250  copies / mL. A negative result does not preclude SARS-CoV-2 infection  and should not be used as the sole basis for treatment or other  patient management decisions.  A negative result may occur with  improper specimen collection / handling, submission of specimen other  than nasopharyngeal swab, presence of viral mutation(s) within the  areas targeted by this assay, and inadequate number of viral copies  (<250 copies / mL). A negative result must be combined with clinical  observations, patient history, and epidemiological information. If result is POSITIVE SARS-CoV-2 target nucleic acids are DETECTED. The SARS-CoV-2 RNA is generally detectable in upper and lower  respiratory specimens dur ing the acute phase of infection.  Positive  results are indicative of active infection with SARS-CoV-2.  Clinical  correlation with patient history and other diagnostic information is  necessary to determine patient infection status.  Positive results do  not rule out bacterial infection or co-infection with other viruses. If result is PRESUMPTIVE POSTIVE SARS-CoV-2 nucleic acids MAY BE PRESENT.   A presumptive positive result was obtained on the submitted specimen  and confirmed on repeat testing.  While 2019 novel coronavirus  (SARS-CoV-2) nucleic acids may be present in the submitted sample  additional confirmatory testing may be necessary for epidemiological  and / or clinical management purposes  to  differentiate between  SARS-CoV-2 and other Sarbecovirus currently known to infect humans.  If clinically indicated additional testing with an alternate test  methodology [phone]) is advised. The SARS-CoV-2 RNA is generally  detectable in upper and lower respiratory sp ecimens during the acute  phase of infection. The expected result is Negative. Fact Sheet for Patients:  StrictlyIdeas.no Fact Sheet for Healthcare Providers: BankingDealers.co.za This test is not yet approved or cleared by the [place] FDA and has been authorized for detection and/or diagnosis of SARS-CoV-2 by FDA under an Emergency Use Authorization (EUA).  This EUA will remain in effect (meaning this test can be used) for the duration of the COVID-19 declaration under Section 564(b)(1) of the Act, 21 U.S.C. section 360bbb-3(b)(1), unless the authorization is terminated or revoked sooner. Performed at [facility], [place] [address] [place], [place] [address]   MRSA PCR Screening     Status: None   Collection Time: 01/04/19  6:15 AM   Specimen: Nasopharyngeal  Result Value Ref Range Status   MRSA by PCR NEGATIVE NEGATIVE Final    Comment:        The GeneXpert MRSA Assay (FDA approved for NASAL specimens only), is one component of a comprehensive MRSA colonization surveillance program. It is not intended to diagnose MRSA infection nor to guide or monitor treatment for MRSA infections. Performed at [facility], [place] [address] [place], [place] [address]   Culture, body fluid-bottle     Status: None (Preliminary result)   Collection Time: 01/04/19 12:28 PM   Specimen: Pleura  Result Value Ref Range Status   Specimen Description PLEURAL RIGHT  Final   Special Requests NONE  Final   Culture   Final    NO GROWTH 2 DAYS Performed at [facility], [place] [address], [place], [place] [address]    Report Status PENDING  Incomplete  Gram stain      Status: None   Collection Time: 01/04/19 12:28 PM   Specimen: Pleura  Result Value Ref Range Status   Specimen Description PLEURAL RIGHT  Final   Special Requests NONE  Final   Gram Stain   Final    RARE WBC PRESENT,BOTH PMN AND MONONUCLEAR NO ORGANISMS SEEN Performed at [facility], [address] [place], [place] [address]    Report Status 01/04/2019 FINAL  Final     Patient was seen and examined on the day of discharge and was found to be in stable condition. Time coordinating discharge: 40 minutes including assessment and coordination of care, as well as examination of the patient.   SIGNED:  Dessa Phi, DO [facility] [email or web address] 01/07/2019, 11:08 AM

## 2019-01-07 NOTE — Consult Note (Signed)
CARDIOLOGY CONSULT NOTE  Patient ID: Charles Riggs. MRN: [id number] DOB/AGE: May 14, 1956 63 y.o.  Admit date: 01/04/2019 Referring Physician  Darylene Price, MD Primary Physician:  Administration, Veterans Reason for Consultation  CHF  HPI:    Charles Riggs.  is a 63 y.o. male  with quadricuspid aortic valve S/P aortic valve replacement on 12/16/2018 with 25 mm bioprosthetic valve through minithoracotomy, hypertension, OSA, admitted to the hospital on 12/28/2018 with severe hypoxemia, dyspnea and right hemothorax needing thoracentesis (1.9L right)  and blood transfusion.  He was discharged home on 12/31/2018 but readmitted again on 01/04/2019  with again progressive dyspnea and hypoxemia and patient was placed on nonrebreather therapy.  Underwent repeat thorocentesis (1.2L hemothorax right) Due to recurrent hospitalization I been consulted for further evaluation.  Today he feels better, still short of breath and doing things.  He did not participate with physical therapy yesterday.  Denies chest pain, dizziness or syncope.  Past Medical History:  Diagnosis Date  . Acute on chronic diastolic heart failure ([place])   . Anemia in chronic kidney disease   . Anxiety   . Aortic insufficiency   . Arthritis   . Asthma   . CHF (congestive heart failure) ([place])   . CKD (chronic kidney disease)   . Essential hypertension   . Nerve pain   . Non-rheumatic aortic regurgitation   . Obstructive sleep apnea    cpap  . Quadricuspid aortic valve   . S/P minimally invasive aortic valve replacement with bioprosthetic valve 12/16/2018   25 mm Edwards Inspiris Resilia stented bovine pericardial tissue valve via right mini thoracotomy approach  . Severe aortic insufficiency   . SOB (shortness of breath)   . Syncope 12/2018    Past Surgical History:  Procedure Laterality Date  . AORTIC VALVE REPLACEMENT N/A 12/16/2018   Procedure: MINIMALLY INVASIVE AORTIC VALVE REPLACEMENT (AVR) using Inspiris Aortic  valve 39mm.;  Surgeon: Rexene Alberts, MD;  Location: [facility];  Service: Open Heart Surgery;  Laterality: N/A;  . HERNIA REPAIR    . IR THORACENTESIS ASP PLEURAL SPACE W/IMG GUIDE  12/29/2018  . RIB PLATING  12/16/2018   Procedure: Rib Plating;  Surgeon: Rexene Alberts, MD;  Location: [facility];  Service: Open Heart Surgery;;  . RIGHT/LEFT HEART CATH AND CORONARY ANGIOGRAPHY N/A 10/14/2018   Procedure: RIGHT/LEFT HEART CATH AND CORONARY ANGIOGRAPHY;  Surgeon: Nigel Mormon, MD;  Location: [facility];  Service: Cardiovascular;  Laterality: N/A;  . TEE WITHOUT CARDIOVERSION N/A 11/11/2018   Procedure: TRANSESOPHAGEAL ECHOCARDIOGRAM (TEE);  Surgeon: Nigel Mormon, MD;  Location: [facility] ENDOSCOPY;  Service: Cardiovascular;  Laterality: N/A;  . TEE WITHOUT CARDIOVERSION N/A 12/16/2018   Procedure: TRANSESOPHAGEAL ECHOCARDIOGRAM (TEE);  Surgeon: Rexene Alberts, MD;  Location: [place];  Service: Open Heart Surgery;  Laterality: N/A;    Social History   Socioeconomic History  . Marital status: Married    Spouse name: Not on file  . Number of children: 4  . Years of education: Not on file  . Highest education level: Not on file  Occupational History  . Not on file  Social Needs  . Financial resource strain: Not on file  . Food insecurity    Worry: Not on file    Inability: Not on file  . Transportation needs    Medical: Not on file    Non-medical: Not on file  Tobacco Use  . Smoking status: Former Smoker    Types: Cigarettes  .  Smokeless tobacco: Never Used  . Tobacco comment: 30 PY  Substance and Sexual Activity  . Alcohol use: No  . Drug use: No  . Sexual activity: Not on file  Lifestyle  . Physical activity    Days per week: Not on file    Minutes per session: Not on file  . Stress: Not on file  Relationships  . Social Herbalist on phone: Not on file    Gets together: Not on file    Attends religious service: Not on file    Active member of club or  organization: Not on file    Attends meetings of clubs or organizations: Not on file    Relationship status: Not on file  . Intimate partner violence    Fear of current or ex partner: Not on file    Emotionally abused: Not on file    Physically abused: Not on file    Forced sexual activity: Not on file  Other Topics Concern  . Not on file  Social History Narrative  . Not on file    No current facility-administered medications on file prior to encounter.    Current Outpatient Medications on File Prior to Encounter  Medication Sig Dispense Refill  . acetaminophen (TYLENOL) 500 MG tablet Take 500 mg by mouth 3 (three) times daily.    Marland Kitchen albuterol (PROVENTIL HFA;VENTOLIN HFA) 108 (90 Base) MCG/ACT inhaler Inhale 2 puffs into the lungs every 6 (six) hours as needed for wheezing or shortness of breath.    Marland Kitchen albuterol (PROVENTIL) (2.5 MG/3ML) 0.083% nebulizer solution Take 3 mLs (2.5 mg total) by nebulization every 6 (six) hours as needed for wheezing or shortness of breath. 75 mL 12  . aspirin EC 81 MG tablet Take 81 mg by mouth daily.    . budesonide-formoterol (SYMBICORT) 160-4.5 MCG/ACT inhaler Inhale 2 puffs into the lungs 2 (two) times daily. 1 Inhaler 1  . COMBIVENT RESPIMAT 20-100 MCG/ACT AERS respimat Inhale 2 puffs into the lungs daily as needed for shortness of breath. 1 Inhaler 0  . dextromethorphan-guaiFENesin (MUCINEX DM) 30-600 MG 12hr tablet Take 1 tablet by mouth 2 (two) times daily.    . ferrous Q000111Q C-folic acid (TRINSICON / FOLTRIN) capsule Take 1 capsule by mouth 3 (three) times daily after meals. 90 capsule 0  . fluticasone (FLONASE) 50 MCG/ACT nasal spray Place 2 sprays into both nostrils daily as needed for allergies or rhinitis.    . furosemide (LASIX) 40 MG tablet Take 1 tablet (40 mg total) by mouth daily for 5 days. 30 tablet 0  . gabapentin (NEURONTIN) 400 MG capsule Take 400 mg by mouth every 8 (eight) hours.    . hydrOXYzine (VISTARIL) 25 MG capsule  Take 25 mg by mouth 3 (three) times daily.    . metoprolol tartrate (LOPRESSOR) 25 MG tablet Take 1 tablet (25 mg total) by mouth 2 (two) times daily. 60 tablet 2  . montelukast (SINGULAIR) 10 MG tablet Take 10 mg by mouth every evening.    Marland Kitchen omeprazole (PRILOSEC) 40 MG capsule TAKE ONE CAPSULE BY MOUTH DAILY (Patient taking differently: Take 40 mg by mouth daily. ) 90 capsule 1  . oxyCODONE-acetaminophen (PERCOCET/ROXICET) 5-325 MG tablet Take 1 tablet by mouth every 8 (eight) hours as needed for moderate pain or severe pain. 21 tablet 0  . rosuvastatin (CRESTOR) 10 MG tablet Take 1 tablet (10 mg total) by mouth daily at 6 PM. 60 tablet 2    ROS  Review of Systems  Constitution: Positive for malaise/fatigue. Negative for chills, decreased appetite and weight gain.  Cardiovascular: Positive for dyspnea on exertion. Negative for leg swelling and syncope.  Respiratory: Positive for cough. Negative for hemoptysis.   Endocrine: Negative for cold intolerance.  Hematologic/Lymphatic: Does not bruise/bleed easily.  Musculoskeletal: Negative for joint swelling.  Gastrointestinal: Negative for abdominal pain, anorexia, change in bowel habit, hematochezia and melena.  Neurological: Negative for headaches and light-headedness.  Psychiatric/Behavioral: Negative for depression and substance abuse.  All other systems reviewed and are negative.   Objective  Blood pressure 122/78, pulse (!) 101, temperature 98.8 F (37.1 C), temperature source Oral, resp. rate 18, height 5\' 11"  (1.803 m), weight 106.1 kg, SpO2 95 %. Body mass index is 32.62 kg/m.  Physical Exam  Constitutional: He appears well-developed and well-nourished. No distress.  HENT:  Head: Atraumatic.  Eyes: Conjunctivae are normal.  Neck: Neck supple. No JVD present. No thyromegaly present.  Cardiovascular: Normal rate, regular rhythm, normal heart sounds and intact distal pulses. Exam reveals no gallop.  No murmur heard. No edema or  JVD.  Pulmonary/Chest: Effort normal.  Left base fine crackles heard.  Clear breath sounds right.  Abdominal: Soft. Bowel sounds are normal.  Musculoskeletal: Normal range of motion.  Neurological: He is alert.  Skin: Skin is warm and dry.  Psychiatric: He has a normal mood and affect.   Radiology   Dg Chest 2 View  Result Date: 01/06/2019 CLINICAL DATA:  Pleural effusion EXAM: CHEST - 2 VIEW COMPARISON:  January 04, 2019 FINDINGS: There is a persistent right pleural effusion with atelectatic change in the right mid lung. Postoperative change noted on the right. Left lung is clear. Heart is upper normal in size with pulmonary vascularity normal. Patient is status post aortic valve replacement. No appreciable pneumothorax. Status post median sternotomy. IMPRESSION: Essentially stable right pleural effusion with areas of atelectatic change on the right. Postoperative change noted on the right. Left lung clear. Stable cardiac silhouette. Status post aortic valve replacement. Electronically Signed   By: Lowella Grip III M.D.   On: 01/06/2019 09:47    Laboratory Examination   CMP Latest Ref Rng & Units 01/07/2019 01/06/2019 01/05/2019  Glucose 70 - 99 mg/dL 108(H) 126(H) 138(H)  BUN 8 - 23 mg/dL 9 12 15   Creatinine 0.61 - 1.24 mg/dL 1.37(H) 1.35(H) 1.47(H)  Sodium 135 - 145 mmol/L 139 137 136  Potassium 3.5 - 5.1 mmol/L 4.1 4.2 3.9  Chloride 98 - 111 mmol/L 103 102 101  CO2 22 - 32 mmol/L 25 25 24   Calcium 8.9 - 10.3 mg/dL 8.7(L) 8.6(L) 8.3(L)  Total Protein 6.5 - 8.1 g/dL - - -  Total Bilirubin 0.3 - 1.2 mg/dL - - -  Alkaline Phos 38 - 126 U/L - - -  AST 15 - 41 U/L - - -  ALT 0 - 44 U/L - - -   CBC Latest Ref Rng & Units 01/06/2019 01/05/2019 01/04/2019  WBC 4.0 - 10.5 K/uL 7.6 8.1 8.1  Hemoglobin 13.0 - 17.0 g/dL 8.0(L) 7.8(L) 7.8(L)  Hematocrit 39.0 - 52.0 % 25.5(L) 25.8(L) 25.4(L)  Platelets 150 - 400 K/uL 551(H) 539(H) 558(H)   Lipid Panel     Component Value Date/Time   CHOL  225 (H) 08/28/2018 0815   TRIG 90 08/28/2018 0815   HDL 78 08/28/2018 0815   CHOLHDL 2.9 08/28/2018 0815   VLDL 18 08/28/2018 0815   LDLCALC 129 (H) 08/28/2018 0815   HEMOGLOBIN A1C Lab  Results  Component Value Date   HGBA1C 5.5 12/11/2018   MPG 111.15 12/11/2018   BNP    Component Value Date/Time   BNP 152.8 (H) 01/04/2019 0053    Cardiac Panel (last 3 results)  Medications:  Scheduled Meds: . acetaminophen  500 mg Oral TID  . aspirin EC  81 mg Oral Daily  . dextromethorphan-guaiFENesin  1 tablet Oral BID  . ferrous Q000111Q C-folic acid  1 capsule Oral TID PC  . furosemide  40 mg Oral Daily  . gabapentin  300 mg Oral BID  . hydrOXYzine  25 mg Oral TID  . insulin aspart  0-5 Units Subcutaneous QHS  . insulin aspart  0-9 Units Subcutaneous TID WC  . metoprolol tartrate  25 mg Oral BID  . mometasone-formoterol  2 puff Inhalation BID  . montelukast  10 mg Oral QPM  . pantoprazole  40 mg Oral Daily  . potassium chloride  20 mEq Oral Daily  . rosuvastatin  10 mg Oral q1800  . sodium chloride flush  3 mL Intravenous Q12H   Continuous Infusions: . sodium chloride     PRN Meds:.sodium chloride, acetaminophen, albuterol, fluticasone, ipratropium-albuterol, ondansetron (ZOFRAN) IV, oxyCODONE-acetaminophen, polyethylene glycol, sodium chloride flush Medications Discontinued During This Encounter  Medication Reason  . albuterol (VENTOLIN HFA) 108 (90 Base) MCG/ACT inhaler 2 puff   . gabapentin (NEURONTIN) capsule 400 mg   . enoxaparin (LOVENOX) injection 110 mg   . oxyCODONE-acetaminophen (PERCOCET/ROXICET) 5-325 MG per tablet 1 tablet   . enoxaparin (LOVENOX) injection 40 mg    Current Meds  Medication Sig  . acetaminophen (TYLENOL) 500 MG tablet Take 500 mg by mouth 3 (three) times daily.  Marland Kitchen albuterol (PROVENTIL HFA;VENTOLIN HFA) 108 (90 Base) MCG/ACT inhaler Inhale 2 puffs into the lungs every 6 (six) hours as needed for wheezing or shortness of breath.  Marland Kitchen  albuterol (PROVENTIL) (2.5 MG/3ML) 0.083% nebulizer solution Take 3 mLs (2.5 mg total) by nebulization every 6 (six) hours as needed for wheezing or shortness of breath.  Marland Kitchen aspirin EC 81 MG tablet Take 81 mg by mouth daily.  . budesonide-formoterol (SYMBICORT) 160-4.5 MCG/ACT inhaler Inhale 2 puffs into the lungs 2 (two) times daily.  . COMBIVENT RESPIMAT 20-100 MCG/ACT AERS respimat Inhale 2 puffs into the lungs daily as needed for shortness of breath.  . dextromethorphan-guaiFENesin (MUCINEX DM) 30-600 MG 12hr tablet Take 1 tablet by mouth 2 (two) times daily.  . ferrous Q000111Q C-folic acid (TRINSICON / FOLTRIN) capsule Take 1 capsule by mouth 3 (three) times daily after meals.  . fluticasone (FLONASE) 50 MCG/ACT nasal spray Place 2 sprays into both nostrils daily as needed for allergies or rhinitis.  . furosemide (LASIX) 40 MG tablet Take 1 tablet (40 mg total) by mouth daily for 5 days.  Marland Kitchen gabapentin (NEURONTIN) 400 MG capsule Take 400 mg by mouth every 8 (eight) hours.  . hydrOXYzine (VISTARIL) 25 MG capsule Take 25 mg by mouth 3 (three) times daily.  . metoprolol tartrate (LOPRESSOR) 25 MG tablet Take 1 tablet (25 mg total) by mouth 2 (two) times daily.  . montelukast (SINGULAIR) 10 MG tablet Take 10 mg by mouth every evening.  Marland Kitchen omeprazole (PRILOSEC) 40 MG capsule TAKE ONE CAPSULE BY MOUTH DAILY (Patient taking differently: Take 40 mg by mouth daily. )  . oxyCODONE-acetaminophen (PERCOCET/ROXICET) 5-325 MG tablet Take 1 tablet by mouth every 8 (eight) hours as needed for moderate pain or severe pain.  . rosuvastatin (CRESTOR) 10 MG tablet Take 1  tablet (10 mg total) by mouth daily at 6 PM.    Cardiac studies   TEE 11/11/2018: LV: Normal. Normal EF. AV: Quadricuspid aortic valve with severe aortic insufficiency. Otherwise normal.  Coronary Angiography 10/14/2018: Normal coronary arteries.  Echocardiogram 12/29/2018 :   1. The left ventricle has mildly reduced systolic  function, with an ejection fraction of 45-50%. The cavity size was normal. Left ventricular diastolic parameters were normal. There is abnormal septal motion consistent with post-operative status.  2. The right ventricle has normal systolc function. There is no increase in right ventricular wall thickness.  3. Trivial circumferential pericardial effusion is present.  4. A 18mm an Edwards bovine bioprosthesis valve is present in the aortic position. Procedure Date: 12/16/2018 Normal aortic valve prosthesis. Mean PG 13 mmHg, which is likely normal. No regurgitation.  5. The ascending aorta is normal in size and structure.  6. Compared to previous transthoracic study on 08/27/2018, bioprosthetic aortic valve is new.  Assessment  1.  Shortness of breath, probably multifactorial including diastolic heart failure, recent surgery and atelectasis.  I reviewed the chest x-ray, significant improvement in right hemothorax, has mild atelectasis left base.  Anemia contributing. 2.  Deconditioning 3.  Hypertension 4.  Hypoxemia 5.  Chronic stage III kidney disease 6. Mild hyperlipidemia  Plan:  I would recommend that we increase metoprolol 50 mg twice daily due to tachycardia and worsening diastolic heart failure.  Would also recommend addition of losartan. Continue Crestor. BP controlled.   VQ scan evaluated, although intermediate risk for PE, no clinical evidence of DVT, he is also had CT angiogram chest on 12/28/2018 which was negative for PE, suspicion for pulmonary embolism is low.  Would recommend ambulating the patient and checking his oxygen, may need home oxygen therapy.  Incentive spirometry encouraged.  Arrange for home health care.  I have discussed with the patient  to have a low threshold to call us at our office after discharge. Will arrange TOC 1 week.   Adrian Prows, MD, [facility] 01/07/2019, 9:03 AM after discharge for dyspnea.[facility]. [place] Pager: [phone] Office:  [phone] If no answer Cell [phone]

## 2019-01-07 NOTE — TOC Progression Note (Addendum)
Transition of Care (TOC) - Progression Note    Patient Details  Name: [name]. MRN: [id number] Date of Birth: 01-Nov-1955  Transition of Care [facility]) CM/SW Contact  Betsie Peckman, Abelino Derrick, RN Phone Number: 01/07/2019, 2:21 PM  Clinical Narrative:   PTA independent from home with wife - pt is a readmit for recurrent pleural effusion - pt is s/p thoracentesis.  Per discharge summary - CVTS will decided outpt if pt will need plurex catheter.  Pt is active with [facility] - clinic aware of admit.  [facility] SW Heather approved HHPT as ordered - [facility] is in agreement with pts choice of [facility] (medicare.gov hh list choice provided).  [facility] accepted referral for HHPT.  Pt is in agreement for RW - pt chose [facility] - agency accepted referral.  CM discussed the importance of daily weights and low sodium diet with pt.  CM faxed Clinicals (including discharge summary)  and HH order directly to [facility] at [phone] - CM also requested Heather to relay that post discharge followup appt with PCP is requested.  CM informed pt of cost of discharge medication with goodrx coupon - pt informed CM that he is familiar with goodrx.  No other CM needs determined at this time - CM signing off      Barriers to Discharge: Barriers Resolved  Expected Discharge Plan and Services           Expected Discharge Date: 01/07/19               DME Arranged: Gilford Rile rolling DME Agency: [facility] Date DME Agency Contacted: 01/07/19 Time DME Agency Contacted: [id number] Representative spoke with at DME Agency: [place]: PT [place]: [facility] (now [facility]) Date [place]: 01/07/19 Time [place]: [place] Representative spoke with at [place]: [place] ([place]) Interventions    Readmission Risk Interventions Readmission Risk Prevention Plan 12/18/2018  Transportation Screening Complete  PCP or Specialist Appt within 3-5 Days Complete  HRI or  [place] Complete  Social Work Consult for [place] Planning/Counseling [place] Screening Not Applicable  Medication Review Press photographer) Referral to Pharmacy  Some recent data might be hidden

## 2019-01-07 NOTE — Progress Notes (Addendum)
1:40pm: CSW called and left a message with Kellie Simmering, CSW at [place] is awaiting a return phone call so that they can coordinate home health services.   CSW will continue to follow and assist with discharge planning services.   Domenic Schwab, MSW, [place] Worker [facility]  [phone]

## 2019-01-07 NOTE — Progress Notes (Signed)
CARDIAC REHAB PHASE I   Offered to ambulate with pt. Pt states he just got back from walking in hallway both using oxygen, and without it. Pt states improvement in breathing. Encouraged pt to continue IS use and continue ambulation at home. Pt denies further questions or concerns. Pt previously referred to [facility] [place].  [id number] Rufina Falco, RN BSN 01/07/2019 11:29 AM

## 2019-01-07 NOTE — Progress Notes (Signed)
SATURATION QUALIFICATIONS: (This note is used to comply with regulatory documentation for home oxygen)  Patient Saturations on Room Air at Rest = 98%  Patient Saturations on Room Air while Ambulating = 93%  Patient Saturations on 2 Liters of oxygen while Ambulating = 95%  Please briefly explain why patient needs home oxygen: Patient with labored breathing while ambulating without oxygen but able to maintain saturation greater than or equal to  93% ambulating 150 ft.

## 2019-01-07 NOTE — Progress Notes (Addendum)
      [address]       [place] [address]             [phone]      Subjective:  No new complaints.  Continues to make improvement.  Getting ready to walk today.  Objective: Vital signs in last 24 hours: Temp:  [98.5 F (36.9 C)-98.8 F (37.1 C)] 98.8 F (37.1 C) (08/12 0748) Pulse Rate:  [101-120] 101 (08/12 0748) Cardiac Rhythm: Sinus tachycardia (08/11 1900) Resp:  [18] 18 (08/11 2213) BP: (107-128)/(74-86) 122/78 (08/12 0748) SpO2:  [94 %-96 %] 96 % (08/12 0748) Weight:  [106.1 kg] 106.1 kg (08/12 0322)  Intake/Output from previous day: 08/11 0701 - 08/12 0700 In: 3 [I.V.:3] Out: 1050 [Urine:1050] Intake/Output this shift: Total I/O In: -  Out: 250 [Urine:250]  General appearance: alert, cooperative and no distress Heart: regular rate and rhythm Lungs: diminished breath sounds right base Abdomen: soft, non-tender; bowel sounds normal; no masses,  no organomegaly Extremities: extremities normal, atraumatic, no cyanosis or edema Wound: clean and dry  Lab Results: Recent Labs    01/05/19 0445 01/06/19 0436  WBC 8.1 7.6  HGB 7.8* 8.0*  HCT 25.8* 25.5*  PLT 539* 551*   BMET:  Recent Labs    01/06/19 0436 01/07/19 0404  NA 137 139  K 4.2 4.1  CL 102 103  CO2 25 25  GLUCOSE 126* 108*  BUN 12 9  CREATININE 1.35* 1.37*  CALCIUM 8.6* 8.7*    PT/INR: No results for input(s): LABPROT, INR in the last 72 hours. ABG    Component Value Date/Time   PHART 7.340 (L) 12/16/2018 1637   HCO3 24.9 12/16/2018 1637   TCO2 23 12/16/2018 2134   ACIDBASEDEF 1.0 12/16/2018 1637   O2SAT 95.0 12/16/2018 1637   CBG (last 3)  Recent Labs    01/06/19 1715 01/06/19 2043 01/07/19 0748  GLUCAP 153* 84 101*    Assessment/Plan:  1. Right Pleural Effusion- S/P Thoracentesis on admission, CXR showed stable appearance.. will repeat CXR today 2. Dispo- care per medicine, if CXR is stable patient can likely be discharged home today, F/U arranged  and in chart   LOS: 3 days    Ellwood Handler 01/07/2019  I have seen and examined the patient and agree with the assessment and plan as outlined.  Patient denies SOB and reports only minimal "pressure-like" pain across right chest related to his surgery.  We will plan f/u CXR in office next week and plan Pleur-X catheter placement if right pleural effusion reaccumulates.  Will defer decisions regarding long term medical therapy for chronic combined systolic and diastolic CHF to Dr Virgina Jock and his team.  Patient still needs to be encouraged to work on increased mobility.  He should no longer require narcotic pain relievers because of his recent surgery, although he does have chronic pain in lower back and hip.  Rexene Alberts, MD 01/07/2019 8:42 AM

## 2019-01-09 LAB — CULTURE, BODY FLUID W GRAM STAIN -BOTTLE: Culture: NO GROWTH

## 2019-01-12 ENCOUNTER — Ambulatory Visit
Admission: RE | Admit: 2019-01-12 | Discharge: 2019-01-12 | Disposition: A | Discharge status: Home / Self Care | Payer: No Typology Code available for payment source | Source: Ambulatory Visit | Attending: Physician Assistant | Admitting: Physician Assistant

## 2019-01-12 ENCOUNTER — Ambulatory Visit: Payer: No Typology Code available for payment source | Admitting: Thoracic Surgery (Cardiothoracic Vascular Surgery)

## 2019-01-12 DIAGNOSIS — J90 Pleural effusion, not elsewhere classified: Secondary | ICD-10-CM

## 2019-01-15 ENCOUNTER — Ambulatory Visit (INDEPENDENT_AMBULATORY_CARE_PROVIDER_SITE_OTHER): Payer: No Typology Code available for payment source | Admitting: Cardiology

## 2019-01-15 ENCOUNTER — Encounter: Payer: Self-pay | Admitting: Cardiology

## 2019-01-15 ENCOUNTER — Other Ambulatory Visit: Payer: Self-pay

## 2019-01-15 VITALS — BP 119/67 | HR 87 | Ht 71.0 in | Wt 229.1 lb

## 2019-01-15 DIAGNOSIS — J90 Pleural effusion, not elsewhere classified: Secondary | ICD-10-CM

## 2019-01-15 DIAGNOSIS — I10 Essential (primary) hypertension: Secondary | ICD-10-CM

## 2019-01-15 DIAGNOSIS — Z952 Presence of prosthetic heart valve: Secondary | ICD-10-CM

## 2019-01-15 NOTE — Progress Notes (Signed)
Subjective:   Charles Setters., male    DOB: 09/07/1955, 64 y.o.   MRN: [id number]   Chief complaint:  Aortic regurgitation  HPI  63 year old African-American male, 21 PY former smoker, OSA, LBBB, h/o asthma, nonobstructive CAD, s/p bioprosthetic AVR (25 mm) through minithoracotomy (12/16/2018 Dr Roxy Manns) for severe AI with quadricuspid aortic valve.   Patient has had two syncopal events since his surgery. Workup has been remarkable for recurrent recurrent bloody pleural effusions and blood loss anemia. He was been trated with thoracentesis and IV iron.   He slowly recovering from recent hospital admissions.  He is beginning to walk more and has not had any chest pain.  His breathing is steadily improving.  His biggest complaint at this time is right and left hip pain.  He is following up with his primary care physician at the [facility] regarding the same.  He has an upcoming office appointment with Dr. Roxy Manns next week.   Past Medical History:  Diagnosis Date  . Acute on chronic diastolic heart failure ([place])   . Anemia in chronic kidney disease   . Anxiety   . Aortic insufficiency   . Arthritis   . Asthma   . CHF (congestive heart failure) ([place])   . CKD (chronic kidney disease)   . Essential hypertension   . Nerve pain   . Non-rheumatic aortic regurgitation   . Obstructive sleep apnea    cpap  . Quadricuspid aortic valve   . S/P minimally invasive aortic valve replacement with bioprosthetic valve 12/16/2018   25 mm Edwards Inspiris Resilia stented bovine pericardial tissue valve via right mini thoracotomy approach  . Severe aortic insufficiency   . SOB (shortness of breath)   . Syncope 12/2018    Past Surgical History:  Procedure Laterality Date  . AORTIC VALVE REPLACEMENT N/A 12/16/2018   Procedure: MINIMALLY INVASIVE AORTIC VALVE REPLACEMENT (AVR) using Inspiris Aortic valve 22mm.;  Surgeon: Rexene Alberts, MD;  Location: [place];  Service: Open Heart Surgery;  Laterality:  N/A;  . HERNIA REPAIR    . IR THORACENTESIS ASP PLEURAL SPACE W/IMG GUIDE  12/29/2018  . RIB PLATING  12/16/2018   Procedure: Rib Plating;  Surgeon: Rexene Alberts, MD;  Location: [facility] OR;  Service: Open Heart Surgery;;  . RIGHT/LEFT HEART CATH AND CORONARY ANGIOGRAPHY N/A 10/14/2018   Procedure: RIGHT/LEFT HEART CATH AND CORONARY ANGIOGRAPHY;  Surgeon: Nigel Mormon, MD;  Location: [place] [facility];  Service: Cardiovascular;  Laterality: N/A;  . TEE WITHOUT CARDIOVERSION N/A 11/11/2018   Procedure: TRANSESOPHAGEAL ECHOCARDIOGRAM (TEE);  Surgeon: Nigel Mormon, MD;  Location: [facility];  Service: Cardiovascular;  Laterality: N/A;  . TEE WITHOUT CARDIOVERSION N/A 12/16/2018   Procedure: TRANSESOPHAGEAL ECHOCARDIOGRAM (TEE);  Surgeon: Rexene Alberts, MD;  Location: [place];  Service: Open Heart Surgery;  Laterality: N/A;    Social History   Socioeconomic History  . Marital status: Married    Spouse name: Not on file  . Number of children: 4  . Years of education: Not on file  . Highest education level: Not on file  Occupational History  . Not on file  Social Needs  . Financial resource strain: Not on file  . Food insecurity    Worry: Not on file    Inability: Not on file  . Transportation needs    Medical: Not on file    Non-medical: Not on file  Tobacco Use  . Smoking status: Former Smoker  Types: Cigarettes  . Smokeless tobacco: Never Used  . Tobacco comment: 30 PY  Substance and Sexual Activity  . Alcohol use: No  . Drug use: No  . Sexual activity: Not on file  Lifestyle  . Physical activity    Days per week: Not on file    Minutes per session: Not on file  . Stress: Not on file  Relationships  . Social Herbalist on phone: Not on file    Gets together: Not on file    Attends religious service: Not on file    Active member of club or organization: Not on file    Attends meetings of clubs or organizations: Not on file    Relationship  status: Not on file  . Intimate partner violence    Fear of current or ex partner: Not on file    Emotionally abused: Not on file    Physically abused: Not on file    Forced sexual activity: Not on file  Other Topics Concern  . Not on file  Social History Narrative  . Not on file    Family History  Problem Relation Age of Onset  . COPD Father   . Cancer Brother     Current Outpatient Medications on File Prior to Visit  Medication Sig Dispense Refill  . acetaminophen (TYLENOL) 500 MG tablet Take 500 mg by mouth 3 (three) times daily.    Marland Kitchen albuterol (PROVENTIL HFA;VENTOLIN HFA) 108 (90 Base) MCG/ACT inhaler Inhale 2 puffs into the lungs every 6 (six) hours as needed for wheezing or shortness of breath.    Marland Kitchen albuterol (PROVENTIL) (2.5 MG/3ML) 0.083% nebulizer solution Take 3 mLs (2.5 mg total) by nebulization every 6 (six) hours as needed for wheezing or shortness of breath. 75 mL 12  . aspirin EC 81 MG tablet Take 81 mg by mouth daily.    . budesonide-formoterol (SYMBICORT) 160-4.5 MCG/ACT inhaler Inhale 2 puffs into the lungs 2 (two) times daily. 1 Inhaler 1  . COMBIVENT RESPIMAT 20-100 MCG/ACT AERS respimat Inhale 2 puffs into the lungs daily as needed for shortness of breath. 1 Inhaler 0  . dextromethorphan-guaiFENesin (MUCINEX DM) 30-600 MG 12hr tablet Take 1 tablet by mouth 2 (two) times daily.    . ferrous [id number] C-folic acid (TRINSICON / FOLTRIN) capsule Take 1 capsule by mouth 3 (three) times daily after meals. 90 capsule 0  . fluticasone (FLONASE) 50 MCG/ACT nasal spray Place 2 sprays into both nostrils daily as needed for allergies or rhinitis.    . furosemide (LASIX) 40 MG tablet Take 1 tablet (40 mg total) by mouth daily for 5 days. 30 tablet 0  . gabapentin (NEURONTIN) 400 MG capsule Take 400 mg by mouth every 8 (eight) hours.    . hydrOXYzine (VISTARIL) 25 MG capsule Take 25 mg by mouth 3 (three) times daily.    Marland Kitchen losartan (COZAAR) 25 MG tablet Take 1 tablet  (25 mg total) by mouth daily. 30 tablet 2  . metoprolol tartrate (LOPRESSOR) 50 MG tablet Take 1 tablet (50 mg total) by mouth 2 (two) times daily. 60 tablet 2  . montelukast (SINGULAIR) 10 MG tablet Take 10 mg by mouth every evening.    Marland Kitchen omeprazole (PRILOSEC) 40 MG capsule TAKE ONE CAPSULE BY MOUTH DAILY (Patient taking differently: Take 40 mg by mouth daily. ) 90 capsule 1  . rosuvastatin (CRESTOR) 10 MG tablet Take 1 tablet (10 mg total) by mouth daily at 6 PM. 60 tablet 2  No current facility-administered medications on file prior to visit.     Cardiovascular studies:  Echocardiogram 12/29/2018:  1. The left ventricle has mildly reduced systolic function, with an ejection fraction of 45-50%. The cavity size was normal. Left ventricular diastolic parameters were normal. There is abnormal septal motion consistent with post-operative status.  2. The right ventricle has normal systolc function. There is no increase in right ventricular wall thickness.  3. Trivial circumferential pericardial effusion is present.  4. A 52mm an Edwards bovine bioprosthesis valve is present in the aortic position. Procedure Date: 12/16/2018 Normal aortic valve prosthesis. Mean PG 13 mmHg, which is likely normal. No regurgitation.  5. The ascending aorta is normal in size and structure.  6. Compared to previous transthoracic study on 08/27/2018, bioprosthetic aortic valve is new.  CTA coronary 08/27/2018: 1. No aortic dissection. 2. Inadequate coaptation of the aortic valve leaflets consistent with severe aortic insufficiency. 3. Coronary artery calcium score 37 Agatston units. This places the patient in the 50th percentile for age and gender, suggesting intermediate risk for future cardiac events. 4. Nonobstructive mild coronary disease. Interpretation of the RCA is somewhat limited by artifact but I think that there is no significant disease.  Cardiovascular studies: EKG 08/27/2018: Sinus rhythm.  LBBB (not new)  Echocardiogram 07/14/2018: Left ventricle cavity is normal in size. Moderate concentric hypertrophy of the left ventricle. Normal global wall motion. Unable to evaluate diastolic function due to severity of aortic regurgitation. Calculated EF 55%. Left atrial cavity is mildly dilated. Probably tricuspid valve with central coaptation defect. Severe aortic regurgitation. Mild to moderate mitral regurgitation. Mild tricuspid regurgitation.  No evidence of pulmonary hypertension.  Lexiscan myoview stress test 06/23/2018:  1. Lexiscan stress test was performed. Exercise capacity was notassessed. Stress symptoms included dizziness and headache. Resting blood pressure was 138/62 mmHg and peak effect blood pressure was 162/64 mmHg. The resting and stress electrocardiogram demonstrated normal sinus rhythm, LBBB and no resting arrhythmias. Stress EKG is non diagnostic for ischemia as it is a pharmacologic stress.  2. The overall quality of the study is good. Left ventricular cavity is noted to be enlarged on the rest and stress studies. Gated SPECT images reveal global decrease in myocardial thickening and wall motion. The left ventricular ejection fraction was calculated or visually estimated to be 38%. SPECT images reveal small sized, fixed, inferior perfusion defect. Findings likely represent dilated nonischemic cardiomyopathy.  3. High risk study.  Recent labs: Results for DELANEY, PARTSCH (MRN [id number]) as of 01/15/2019 08:59  Ref. Range 01/07/2019 [id number]  BASIC METABOLIC PANEL Unknown Rpt (A)  Sodium Latest Ref Range: 135 - 145 mmol/L 139  Potassium Latest Ref Range: 3.5 - 5.1 mmol/L 4.1  Chloride Latest Ref Range: 98 - 111 mmol/L 103  CO2 Latest Ref Range: 22 - 32 mmol/L 25  Glucose Latest Ref Range: 70 - 99 mg/dL 108 (H)  BUN Latest Ref Range: 8 - 23 mg/dL 9  Creatinine Latest Ref Range: 0.61 - 1.24 mg/dL 1.37 (H)  Calcium Latest Ref Range: 8.9 - 10.3 mg/dL 8.7 (L)   Anion gap Latest Ref Range: 5 - 15  11  GFR, Est Non African American Latest Ref Range: >60 mL/min 55 (L)  GFR, Est African American Latest Ref Range: >60 mL/min >60   Results for QUIENTIN, BACALLAO (MRN [id number]) as of 01/15/2019 08:59  Ref. Range 01/06/2019 04:36  WBC Latest Ref Range: 4.0 - 10.5 K/uL 7.6  RBC Latest Ref Range: 4.22 - 5.81  MIL/uL 3.21 (L)  Hemoglobin Latest Ref Range: 13.0 - 17.0 g/dL 8.0 (L)  HCT Latest Ref Range: 39.0 - 52.0 % 25.5 (L)  MCV Latest Ref Range: 80.0 - 100.0 fL 79.4 (L)  MCH Latest Ref Range: 26.0 - 34.0 pg 24.9 (L)  MCHC Latest Ref Range: 30.0 - 36.0 g/dL 31.4  RDW Latest Ref Range: 11.5 - 15.5 % 16.7 (H)  Platelets Latest Ref Range: 150 - 400 K/uL 551 (H)  nRBC Latest Ref Range: 0.0 - 0.2 % 0.0    Review of Systems  Constitution: Negative for decreased appetite, malaise/fatigue, weight gain and weight loss.  HENT: Negative for congestion.   Eyes: Negative for visual disturbance.  Cardiovascular: Negative for chest pain, dyspnea on exertion, leg swelling, palpitations and syncope.  Respiratory: Negative for shortness of breath.   Endocrine: Negative for cold intolerance.  Hematologic/Lymphatic: Does not bruise/bleed easily.  Skin: Negative for itching and rash.  Musculoskeletal: Negative for myalgias.  Gastrointestinal: Negative for abdominal pain, nausea and vomiting.  Genitourinary: Negative for dysuria.  Neurological: Negative for dizziness and weakness.  Psychiatric/Behavioral: The patient is not nervous/anxious.   All other systems reviewed and are negative.       Vitals:   01/15/19 1057  BP: 119/67  Pulse: 87  SpO2: 96%    Physical Exam  Constitutional: He is oriented to person, place, and time. He appears well-developed and well-nourished. No distress.  HENT:  Head: Normocephalic and atraumatic.  Eyes: Pupils are equal, round, and reactive to light. Conjunctivae are normal.  Neck: No JVD present.  Cardiovascular: Normal  rate, regular rhythm and intact distal pulses.  No murmur heard. Pulmonary/Chest: Effort normal and breath sounds normal. He has no wheezes. He has no rales.  Well healed scar  Abdominal: Soft. Bowel sounds are normal. There is no rebound.  Musculoskeletal:        General: No edema.  Lymphadenopathy:    He has no cervical adenopathy.  Neurological: He is alert and oriented to person, place, and time. No cranial nerve deficit.  Skin: Skin is warm and dry.  Psychiatric: He has a normal mood and affect.  Nursing note and vitals reviewed.       Assessment & Recommendations:   63 year old African-American male, 36 PY former smoker, OSA, LBBB, h/o asthma, nonobstructive CAD, s/p bioprosthetic AVR (25 mm) through minithoracotomy (12/16/2018 Dr Roxy Manns) for severe AI with quadricuspid aortic valve.   S/p AVR: 25 mm bioprosthetic valve for quadricuspid valve with severe AI Normal functioning on echocardiogram 12/2018. Continue Aspirin 81 mg daily.   Syncope: No recurrence. Likely due to blood loss anemia  Hypertension: Well controlled on metoprolol tartarate 50 mg bid. This can be stopped in future if syncope recurs.   Blood loss anemia: Likely from recurrent hemorrhagic pleural effusions and recent surgery. Expect this to improve.   Follow up in 3 months   Charles Riggs Esther Hardy, MD [facility] [place] Pager: [phone] Office: [phone] If no answer Cell [phone]

## 2019-01-16 ENCOUNTER — Other Ambulatory Visit: Payer: Self-pay | Admitting: Thoracic Surgery (Cardiothoracic Vascular Surgery)

## 2019-01-16 DIAGNOSIS — Z952 Presence of prosthetic heart valve: Secondary | ICD-10-CM

## 2019-01-16 DIAGNOSIS — J90 Pleural effusion, not elsewhere classified: Secondary | ICD-10-CM

## 2019-01-19 ENCOUNTER — Encounter: Payer: Self-pay | Admitting: Thoracic Surgery (Cardiothoracic Vascular Surgery)

## 2019-01-19 ENCOUNTER — Ambulatory Visit
Admission: RE | Admit: 2019-01-19 | Discharge: 2019-01-19 | Disposition: A | Discharge status: Home / Self Care | Payer: No Typology Code available for payment source | Source: Ambulatory Visit | Attending: Thoracic Surgery (Cardiothoracic Vascular Surgery) | Admitting: Thoracic Surgery (Cardiothoracic Vascular Surgery)

## 2019-01-19 ENCOUNTER — Ambulatory Visit (INDEPENDENT_AMBULATORY_CARE_PROVIDER_SITE_OTHER): Payer: Self-pay | Admitting: Thoracic Surgery (Cardiothoracic Vascular Surgery)

## 2019-01-19 ENCOUNTER — Other Ambulatory Visit: Payer: Self-pay

## 2019-01-19 VITALS — BP 102/69 | HR 83 | Temp 96.8°F | Resp 16 | Ht 71.0 in | Wt 229.0 lb

## 2019-01-19 DIAGNOSIS — Z953 Presence of xenogenic heart valve: Secondary | ICD-10-CM

## 2019-01-19 DIAGNOSIS — Z952 Presence of prosthetic heart valve: Secondary | ICD-10-CM

## 2019-01-19 NOTE — Progress Notes (Signed)
[address]       [place] [address]             [phone]     CARDIOTHORACIC SURGERY OFFICE NOTE  Referring Provider is Nigel Mormon, MD [facility]CP is [facility]   HPI:  Patient is a 63 year old African-American male with history ofquadricuspid aortic valve andsevere aortic insufficiency, hypertension, left bundle branch block, asthma, obstructive sleep apnea, abnormal stress test and remote history of tobacco use whounderwent minimally invasive aortic valve replacement using a stented bovine pericardial tissue valve on December 16, 2018 via right minithoracotomy approach.  The patient's early postoperative recovery was notable for acute blood loss anemia but he did not require transfusion of blood products.  He otherwise did well and was discharged from the hospital on the fourth postoperative day.  Hemoglobin on the day of hospital discharge was 6.9 with hematocrit 22.5%.  He was readmitted to the hospital within a week with increased chest pain and shortness of breath.  Hemoglobin and hematocrit were down slightly further to 6.6 and 22.7%, respectively.  Chest x-ray revealed opacity at the right lung base consistent with right pleural effusion.  He underwent ultrasound-guided thoracentesis yielding 1.9 L of bloody fluid although follow-up x-ray revealed significant residual pleural effusion remained.  He was transfused 2 units packed red blood cells and improved considerably.  He was discharged home but again readmitted several days later.  Hemoglobin remained stable but significant residual pleural effusion remain.  He underwent repeat thoracentesis yielding another 1.2 L of bloody fluid.  Since then he has done better.  Chest x-ray performed January 12, 2019 remained stable.  The patient returns to our office today for further follow-up.  He reports that he is doing very well.  His only complaint is that of his chronic hip and back pain.  He has no pain in  his chest whatsoever.  He has no shortness of breath.  Appetite is great.  He is sleeping well at night.  He wants to start working out lifting weights.   Current Outpatient Medications  Medication Sig Dispense Refill   acetaminophen (TYLENOL) 500 MG tablet Take 500 mg by mouth 3 (three) times daily.     albuterol (PROVENTIL HFA;VENTOLIN HFA) 108 (90 Base) MCG/ACT inhaler Inhale 2 puffs into the lungs every 6 (six) hours as needed for wheezing or shortness of breath.     albuterol (PROVENTIL) (2.5 MG/3ML) 0.083% nebulizer solution Take 3 mLs (2.5 mg total) by nebulization every 6 (six) hours as needed for wheezing or shortness of breath. 75 mL 12   aspirin EC 81 MG tablet Take 81 mg by mouth daily.     budesonide-formoterol (SYMBICORT) 160-4.5 MCG/ACT inhaler Inhale 2 puffs into the lungs 2 (two) times daily. 1 Inhaler 1   COMBIVENT RESPIMAT 20-100 MCG/ACT AERS respimat Inhale 2 puffs into the lungs daily as needed for shortness of breath. 1 Inhaler 0   dextromethorphan-guaiFENesin (MUCINEX DM) 30-600 MG 12hr tablet Take 1 tablet by mouth 2 (two) times daily.     ferrous [id number] C-folic acid (TRINSICON / FOLTRIN) capsule Take 1 capsule by mouth 3 (three) times daily after meals. 90 capsule 0   fluticasone (FLONASE) 50 MCG/ACT nasal spray Place 2 sprays into both nostrils daily as needed for allergies or rhinitis.     furosemide (LASIX) 40 MG tablet Take 1 tablet (40 mg total) by mouth daily for 5 days. 30 tablet 0   gabapentin (NEURONTIN) 400  MG capsule Take 400 mg by mouth every 8 (eight) hours.     hydrOXYzine (VISTARIL) 25 MG capsule Take 25 mg by mouth 3 (three) times daily.     losartan (COZAAR) 25 MG tablet Take 1 tablet (25 mg total) by mouth daily. 30 tablet 2   metoprolol tartrate (LOPRESSOR) 50 MG tablet Take 1 tablet (50 mg total) by mouth 2 (two) times daily. 60 tablet 2   montelukast (SINGULAIR) 10 MG tablet Take 10 mg by mouth every evening.      omeprazole (PRILOSEC) 40 MG capsule TAKE ONE CAPSULE BY MOUTH DAILY (Patient taking differently: Take 40 mg by mouth daily. ) 90 capsule 1   rosuvastatin (CRESTOR) 10 MG tablet Take 1 tablet (10 mg total) by mouth daily at 6 PM. 60 tablet 2   No current facility-administered medications for this visit.       Physical Exam:   BP 102/69 (BP Location: Right Arm, Patient Position: Sitting, Cuff Size: Normal)    Pulse 83    Temp (!) 96.8 F (36 C)    Resp 16    Ht 5\' 11"  (1.803 m)    Wt 229 lb (103.9 kg)    SpO2 97% Comment: RA   BMI 31.94 kg/m   General:  Well-appearing  Chest:   Clear to auscultation with slightly diminished breath sounds at bases  CV:   Regular rate and rhythm  Incisions:  Healing nicely  Abdomen:  Soft nontender  Extremities:  Warm and well-perfused  Diagnostic Tests:  ECHO  Indications:    Pericardial effusion 423.9 / I31.3                 Aortic Valve Disorder 424.1 / I35.9   History:        Patient has prior history of Echocardiogram examinations, most                 recent 12/16/2018. CHF Signs/Symptoms: Shortness of Breath.                 Aortic Valve: A 44mm Edwards bovine aortic valve bioprosthesis                 Procedure Date: 12/16/2018 Chronic kideny disease. Sleep apnea.   Sonographer:    Darlina Sicilian RDCS Referring Phys: [place]    1. The left ventricle has mildly reduced systolic function, with an ejection fraction of 45-50%. The cavity size was normal. Left ventricular diastolic parameters were normal. There is abnormal septal motion consistent with post-operative status.  2. The right ventricle has normal systolc function. There is no increase in right ventricular wall thickness.  3. Trivial circumferential pericardial effusion is present.  4. A 61mm an Edwards bovine bioprosthesis valve is present in the aortic position. Procedure Date: 12/16/2018 Normal aortic valve prosthesis. Mean PG 13 mmHg, which is  likely normal. No regurgitation.  5. The ascending aorta is normal in size and structure.  6. Compared to previous transthoracic study on 08/27/2018, bioprosthetic aortic valve is new.  FINDINGS  Left Ventricle: The left ventricle has mildly reduced systolic function, with an ejection fraction of 45-50%. The cavity size was normal. There is no increase in left ventricular wall thickness. Left ventricular diastolic parameters were normal. There  is abnormal (paradoxical) septal motion consistent with post-operative status.    Right Ventricle: The right ventricle has normal systolic function. There is no increase in right ventricular wall thickness.  Left Atrium: Left  atrial size was normal in size.  Right Atrium: Right atrial size was normal in size. Right atrial pressure is estimated at 10 mmHg.  Pericardium: Trivial pericardial effusion is present. The pericardial effusion is circumferential.  Mitral Valve: The mitral valve is normal in structure. Mitral valve regurgitation is not visualized by color flow Doppler.  Tricuspid Valve: The tricuspid valve was grossly normal. Tricuspid valve regurgitation was not visualized by color flow Doppler.  Aortic Valve: Aortic valve regurgitation was not visualized by color flow Doppler. A 14mm Edwards bovine aortic valve bioprosthesis valve is present in the aortic position. Procedure Date: 12/16/2018 Normal aortic valve prosthesis.  Pulmonic Valve: The pulmonic valve was grossly normal. Pulmonic valve regurgitation is not visualized by color flow Doppler.  Aorta: The ascending aorta is normal in size and structure.    +--------------+--------++  LEFT VENTRICLE                 +----------------+----------++ +--------------+--------++       Diastology                     PLAX 2D                        +----------------+----------++ +--------------+--------++       LV e' lateral:   14.40 cm/s    LVIDd:         4.10 cm          +----------------+----------++ +--------------+--------++       LV E/e' lateral: 8.2           LVIDs:         3.40 cm         +----------------+----------++ +--------------+--------++       LV e' medial:    9.46 cm/s     LV PW:         1.50 cm         +----------------+----------++ +--------------+--------++       LV E/e' medial:  12.5          LV IVS:        1.50 cm         +----------------+----------++ +--------------+--------++  LVOT diam:     1.90 cm    +--------------+--------++  LV SV:         27 ml      +--------------+--------++  LV SV Index:   11.31      +--------------+--------++  LVOT Area:     2.84 cm   +--------------+--------++                            +--------------+--------++   +------------------+---------++  LV Volumes (MOD)               +------------------+---------++  LV area d, A2C:    37.70 cm   +------------------+---------++  LV area d, A4C:    37.20 cm   +------------------+---------++  LV area s, A2C:    24.70 cm   +------------------+---------++  LV area s, A4C:    26.40 cm   +------------------+---------++  LV major d, A2C:   9.80 cm     +------------------+---------++  LV major d, A4C:   9.09 cm     +------------------+---------++  LV major s, A2C:   8.50 cm     +------------------+---------++  LV major s, A4C:   8.33 cm     +------------------+---------++  LV vol d, MOD A2C: 120.0  ml    +------------------+---------++  LV vol d, MOD A4C: 125.0 ml    +------------------+---------++  LV vol s, MOD A2C: 61.0 ml     +------------------+---------++  LV vol s, MOD A4C: 69.3 ml     +------------------+---------++  LV SV MOD A2C:     59.0 ml     +------------------+---------++  LV SV MOD A4C:     125.0 ml    +------------------+---------++  LV SV MOD BP:      61.2 ml     +------------------+---------++  +-----------+-------++----------++  LEFT ATRIUM          Index        +-----------+-------++----------++  LA diam:    3.50  cm  1.54 cm/m   +-----------+-------++----------++  +------------------+------------++  AORTIC VALVE                      +------------------+------------++  AV Area (Vmax):    1.32 cm       +------------------+------------++  AV Area (Vmean):   1.32 cm       +------------------+------------++  AV Area (VTI):     1.36 cm       +------------------+------------++  AV Vmax:           247.67 cm/s    +------------------+------------++  AV Vmean:          170.000 cm/s   +------------------+------------++  AV VTI:            0.327 m        +------------------+------------++  AV Peak Grad:      24.5 mmHg      +------------------+------------++  AV Mean Grad:      13.7 mmHg      +------------------+------------++  LVOT Vmax:         115.00 cm/s    +------------------+------------++  LVOT Vmean:        79.150 cm/s    +------------------+------------++  LVOT VTI:          0.156 m        +------------------+------------++  LVOT/AV VTI ratio: 0.48           +------------------+------------++   +-------------+-------++  AORTA                   +-------------+-------++  Ao Root diam: 2.40 cm   +-------------+-------++  +--------------+----------++  MITRAL VALVE                 +--------------+-------+ +--------------+----------++   SHUNTS                   MV Area (PHT): 9.14 cm      +--------------+-------+ +--------------+----------++   Systemic VTI:  0.16 m    MV PHT:        24.07 msec    +--------------+-------+ +--------------+----------++   Systemic Diam: 1.90 cm   MV Decel Time: 83 msec       +--------------+-------+ +--------------+----------++ +--------------+-----------++  MV E velocity: 118.00 cm/s   +--------------+-----------++    Vernell Leep MD Electronically signed by Vernell Leep MD Signature Date/Time: 12/29/2018/8:28:55 AM      CHEST - 2 VIEW  COMPARISON:  January 12, 2019, January 07, 2019, January 06, 2019  FINDINGS: Cardiomediastinal  silhouette unchanged in size and contour. Surgical changes of minimally invasive aortic valve repair.  Partial obscuration the right hemidiaphragm persists with meniscus on the lateral view.  No pneumothorax.  No interlobular septal thickening.  IMPRESSION: Similar appearance of the chest x-ray, with  surgical changes of minimally invasive aortic valve repair, right-sided pleural fluid with associated atelectasis/consolidation.   Electronically Signed   By: Corrie Mckusick D.O.   On: 01/19/2019 14:24   Impression:  Patient is doing well less than 6 weeks status post minimally invasive aortic valve replacement using a bioprosthetic tissue valve.  I have personally reviewed the patient's follow-up chest x-ray performed earlier today which demonstrates stable small residual right pleural effusion that has not reaccumulated since his last thoracentesis.    Plan:  We have not recommended any change the patient's current medications.  I encouraged the patient to continue to gradually increase his physical activity as tolerated.  We have specifically discussed physical limitations with regards to his recent surgery and plans for gradual resumption of physical exercise.  He may resume driving an automobile.  The patient has been reminded regarding the importance of dental hygiene and the lifelong need for antibiotic prophylaxis for all dental cleanings and other related invasive procedures.  The patient will return to our office for routine follow-up and repeat chest x-ray in approximately 2 months.  All questions answered.    Valentina Gu. Roxy Manns, MD 01/19/2019 3:08 PM

## 2019-01-19 NOTE — Patient Instructions (Addendum)
Continue all previous medications without any changes at this time  You may continue to gradually increase your physical activity as tolerated.  Refrain from any heavy lifting or strenuous use of your arms and shoulders until at least 8 weeks from the time of your surgery, and avoid activities that cause increased pain in your chest on the side of your surgical incision.  Otherwise you may continue to increase activities without any particular limitations.  Increase the intensity and duration of physical activity gradually.  You may return to driving an automobile as long as you are no longer requiring oral narcotic pain relievers during the daytime.  It would be wise to start driving only short distances during the daylight and gradually increase from there as you feel comfortable.  Endocarditis is a potentially serious infection of heart valves or inside lining of the heart.  It occurs more commonly in patients with diseased heart valves (such as patient's with aortic or mitral valve disease) and in patients who have undergone heart valve repair or replacement.  Certain surgical and dental procedures may put you at risk, such as dental cleaning, other dental procedures, or any surgery involving the respiratory, urinary, gastrointestinal tract, gallbladder or prostate gland.   To minimize your chances for develooping endocarditis, maintain good oral health and seek prompt medical attention for any infections involving the mouth, teeth, gums, skin or urinary tract.    Always notify your doctor or dentist about your underlying heart valve condition before having any invasive procedures. You will need to take antibiotics before certain procedures, including all routine dental cleanings or other dental procedures.  Your cardiologist or dentist should prescribe these antibiotics for you to be taken ahead of time.       

## 2019-01-21 ENCOUNTER — Telehealth (HOSPITAL_COMMUNITY): Payer: Self-pay | Admitting: *Deleted

## 2019-01-21 NOTE — Telephone Encounter (Signed)
-----   Message from [facility], MD sent at 01/20/2019  5:20 PM EDT ----- Regarding: RE: ok to exercise in group setting with High Covid Risk Score? Ok to participate with universal COVID precautions.  Thanks MJP  ----- Message ----- From: Rowe Pavy, RN Sent: 01/20/2019   2:26 PM EDT To: Nigel Mormon, MD Subject: ok to exercise in group setting with High Co#  Greetings Dr. Virgina Jock,  We are seeing patients on-site for cardiac rehab . A great deal of planning with advisement from our Medical Director - Dr. Radford Pax, CV Service line leadership, Infection Disease Control, Facilities, security, recommendations from American Association of Cardiac and Pulmonary Rehab (AACVPR) with the goal for optimal patient safety. Patients will have strict guidelines and criteria they must adhere to and follow. Patients will wear a mask during exercise and practice social distancing. Patients will have to complete screening prior to entry into gym area.   Your patient expressed great interest in participating in facility cardiac rehab. Patient has a COVID-19 risk score of 9. Do you feel this patient is appropriate to resume exercise in facility cardiac rehab? Any additional restrictions you feel are appropriate for this patient?   Pt seen in follow up by you on 8/20 and he saw Dr. Roxy Manns on 8/24  Thank you and we appreciate your input  Maurice Small RN, BSN Cardiac and Pulmonary Rehab Nurse Navigator   Cardiac Rehab Staff

## 2019-02-01 ENCOUNTER — Other Ambulatory Visit: Payer: Self-pay | Admitting: Cardiology

## 2019-02-10 ENCOUNTER — Ambulatory Visit (INDEPENDENT_AMBULATORY_CARE_PROVIDER_SITE_OTHER): Payer: No Typology Code available for payment source

## 2019-02-10 ENCOUNTER — Ambulatory Visit (INDEPENDENT_AMBULATORY_CARE_PROVIDER_SITE_OTHER): Payer: No Typology Code available for payment source | Admitting: Orthopaedic Surgery

## 2019-02-10 ENCOUNTER — Encounter: Payer: Self-pay | Admitting: Orthopaedic Surgery

## 2019-02-10 VITALS — Ht 71.0 in | Wt 227.0 lb

## 2019-02-10 DIAGNOSIS — M1612 Unilateral primary osteoarthritis, left hip: Secondary | ICD-10-CM | POA: Diagnosis not present

## 2019-02-10 NOTE — Progress Notes (Signed)
Office Visit Note   Patient: Charles Riggs.           Date of Birth: 26-Oct-1955           MRN: [id number] Visit Date: 02/10/2019              Requested by: [facility] [address] [place],  [place] [address] PCP: [facility]   Assessment & Plan: Visit Diagnoses:  1. Primary osteoarthritis of left hip     Plan: Impression is significant progression of left hip DJD.  I reviewed the x-rays with the patient today and demonstrated to him how this has progressed very quickly over the last 10 months.  We discussed nonsurgical and surgical treatments and their associated risks and benefits and reasonable likelihood for meaningful pain relief.  Patient would like to proceed with a left total hip replacement pending cardiac clearance.  Patient recently had aortic valve replacement.  Questions encouraged and answered. Total face to face encounter time was greater than 45 minutes and over half of this time was spent in counseling and/or coordination of care.  Follow-Up Instructions: Return if symptoms worsen or fail to improve.   Orders:  Orders Placed This Encounter  Procedures  . XR HIP UNILAT W OR W/O PELVIS 2-3 VIEWS LEFT   No orders of the defined types were placed in this encounter.     Procedures: No procedures performed   Clinical Data: No additional findings.   Subjective: Chief Complaint  Patient presents with  . Left Hip - Pain    Charles Riggs is a very pleasant 63 year old gentleman who comes in for evaluation of left hip pain and lower back pain.  He is mainly here for the left hip pain.  He has a lumbar ESI scheduled for his right leg radicular pain.  In terms of his left hip he states that he has deep-seated left hip and groin pain that is worse with prolonged standing and walking.  She is having trouble with activity.  The pain is constant.  He takes oxycodone meloxicam for chronic low back pain.  Denies any numbness or tingling in the left  leg.  Denies any radicular symptoms.   Review of Systems  Constitutional: Negative.   All other systems reviewed and are negative.    Objective: Vital Signs: Ht 5\' 11"  (1.803 m)   Wt 227 lb (103 kg)   BMI 31.66 kg/m   Physical Exam Vitals signs and nursing note reviewed.  Constitutional:      Appearance: He is well-developed.  HENT:     Head: Normocephalic and atraumatic.  Eyes:     Pupils: Pupils are equal, round, and reactive to light.  Neck:     Musculoskeletal: Neck supple.  Pulmonary:     Effort: Pulmonary effort is normal.  Abdominal:     Palpations: Abdomen is soft.  Musculoskeletal: Normal range of motion.  Skin:    General: Skin is warm.  Neurological:     Mental Status: He is alert and oriented to person, place, and time.  Psychiatric:        Behavior: Behavior normal.        Thought Content: Thought content normal.        Judgment: Judgment normal.     Ortho Exam Left hip exam shows positive Stinchfield, positive logroll, positive FADIR.  He has no sciatic tension signs. Specialty Comments:  No specialty comments available.  Imaging: Xr Hip Unilat W Or W/o Pelvis 2-3 Views  Left  Result Date: 02/10/2019 Advanced left hip DJD with bone-on-bone joint space narrowing.  Significant progression compared to prior x-ray 10 months ago.    PMFS History: Patient Active Problem List   Diagnosis Date Noted  . Primary osteoarthritis of left hip 02/10/2019  . Acute respiratory failure with hypoxemia ([place]) 01/04/2019  . Pleural effusion 12/28/2018  . Syncope 12/28/2018  . Acute blood loss anemia   . S/P AVR (aortic valve replacement) 12/16/2018  . Anemia in chronic kidney disease   . Chronic kidney disease (CKD)   . Elevated troponin 08/29/2018  . Severe aortic insufficiency   . Acute on chronic diastolic heart failure ([place])   . Nonrheumatic aortic valve insufficiency   . Essential hypertension   . Obstructive sleep apnea   . Quadricuspid aortic valve    . Chest pain in adult 08/27/2018  . LEG PAIN 04/26/2010  . DEPRESSION 08/19/2009  . RENAL CALCULUS 01/05/2009  . DM 10/12/2008  . Personal history presenting hazards to health 06/16/2007  . Asthma 03/24/2007  . GERD 03/24/2007   Past Medical History:  Diagnosis Date  . Acute on chronic diastolic heart failure ([place])   . Anemia in chronic kidney disease   . Anxiety   . Aortic insufficiency   . Arthritis   . Asthma   . CHF (congestive heart failure) ([place])   . CKD (chronic kidney disease)   . Essential hypertension   . Nerve pain   . Non-rheumatic aortic regurgitation   . Obstructive sleep apnea    cpap  . Quadricuspid aortic valve   . S/P minimally invasive aortic valve replacement with bioprosthetic valve 12/16/2018   25 mm Edwards Inspiris Resilia stented bovine pericardial tissue valve via right mini thoracotomy approach  . Severe aortic insufficiency   . SOB (shortness of breath)   . Syncope 12/2018    Family History  Problem Relation Age of Onset  . COPD Father   . Cancer Brother     Past Surgical History:  Procedure Laterality Date  . AORTIC VALVE REPLACEMENT N/A 12/16/2018   Procedure: MINIMALLY INVASIVE AORTIC VALVE REPLACEMENT (AVR) using Inspiris Aortic valve 39mm.;  Surgeon: Rexene Alberts, MD;  Location: [facility];  Service: Open Heart Surgery;  Laterality: N/A;  . HERNIA REPAIR    . IR THORACENTESIS ASP PLEURAL SPACE W/IMG GUIDE  12/29/2018  . RIB PLATING  12/16/2018   Procedure: Rib Plating;  Surgeon: Rexene Alberts, MD;  Location: [facility] OR;  Service: Open Heart Surgery;;  . RIGHT/LEFT HEART CATH AND CORONARY ANGIOGRAPHY N/A 10/14/2018   Procedure: RIGHT/LEFT HEART CATH AND CORONARY ANGIOGRAPHY;  Surgeon: Nigel Mormon, MD;  Location: [facility] CV LAB;  Service: Cardiovascular;  Laterality: N/A;  . TEE WITHOUT CARDIOVERSION N/A 11/11/2018   Procedure: TRANSESOPHAGEAL ECHOCARDIOGRAM (TEE);  Surgeon: Nigel Mormon, MD;  Location: [facility];  Service:  Cardiovascular;  Laterality: N/A;  . TEE WITHOUT CARDIOVERSION N/A 12/16/2018   Procedure: TRANSESOPHAGEAL ECHOCARDIOGRAM (TEE);  Surgeon: Rexene Alberts, MD;  Location: [facility];  Service: Open Heart Surgery;  Laterality: N/A;   Social History   Occupational History  . Not on file  Tobacco Use  . Smoking status: Former Smoker    Types: Cigarettes  . Smokeless tobacco: Never Used  . Tobacco comment: 30 PY  Substance and Sexual Activity  . Alcohol use: No  . Drug use: No  . Sexual activity: Not on file

## 2019-02-16 ENCOUNTER — Telehealth: Payer: Self-pay

## 2019-02-16 NOTE — Telephone Encounter (Signed)
Patient has called twice today wanting to let us know that he has obtained [place] for surgery recommended. Said the Josem Kaufmann  was time sensitive so he was wanting to make sure he got scheduled asap. When I asked him what the dates on the auth were he said 01/29/19-07/28/19. Advised him you would be in contact and there would not be a problem getting him scheduled before March.

## 2019-02-18 NOTE — Telephone Encounter (Signed)
I called patient.  Advised I need to see [facility] auth before scheduling.  Trying to obtain a copy.

## 2019-02-19 NOTE — Telephone Encounter (Signed)
Obtained auth.  Called patient back yesterday and scheduled surgery.

## 2019-02-24 ENCOUNTER — Telehealth: Payer: Self-pay

## 2019-02-24 NOTE — Telephone Encounter (Signed)
I never recommended any driving restrictions from cardiac standpoint. I do not know if there was a noncardiac reason-such as sleep apnea or falling asleep while driving-which may have led to his license being revoked. It will not be appropriate for me to write a letter regarding driving recommendations circumventing those reasons.  Thanks MJP

## 2019-02-24 NOTE — Telephone Encounter (Signed)
Now hes saying if you can write a letter stating that he is okay to drive heart wise? It seems like he is trying to get all his doctors to give him clearance as he needs documentation for the [facility]

## 2019-02-24 NOTE — Telephone Encounter (Signed)
Pt called wanting to know if you can write a clearance letter so that he can get his license back; Also he wants a copy of his post surgical echo I see one done in august ill print that put for him

## 2019-02-24 NOTE — Telephone Encounter (Signed)
He has to get a physical every year and because he has sleep apnea and on cpap machine; They need his cardiologists to give the okay that you think he is okay to drive; He says its a once a year thing

## 2019-02-24 NOTE — Telephone Encounter (Signed)
Ideally should get it from his sleep apnea doctor. I am unaware of how well controlled his sleep apnea is.   Thanks MJP

## 2019-02-26 NOTE — Telephone Encounter (Signed)
Pt aware per tara O.

## 2019-03-03 ENCOUNTER — Telehealth: Payer: Self-pay

## 2019-03-03 NOTE — Telephone Encounter (Signed)
Pt called and asked OV be sent to [facility] hospital in [place], faxed to [phone]

## 2019-03-04 ENCOUNTER — Other Ambulatory Visit: Payer: Self-pay

## 2019-03-04 NOTE — Progress Notes (Signed)
[facility] [address], [place] [address] Phone: [phone] Fax: [place], [place] [phone] [place] [address] Phone: [phone] Fax: [phone]      Your procedure is scheduled on Monday, October 12th, 2020.Marland Kitchen  Report to [facility] Main Entrance "A" at 11:30 A.M., and check in at the Admitting office.  Call this number if you have problems the morning of surgery:  [phone]  Call [phone] if you have any questions prior to your surgery date Monday-Friday 8am-4pm    Remember:  Do not eat or drink after midnight the night before your surgery    Take these medicines the morning of surgery with A SIP OF WATER : Albuterol (Proventil) inhaler - if needed Amlodipine (Norvasc) Budesonide-Formoterol (Symbicort) Combivent Respimat - if needed Thorphan-Guaifenesin (Mucinex DM) Fluticasone (Flonase) nasal spray - if needed Gabapentin (Neurontin) Hydroxyzine (Vistaril) Metoprolol (Lopressor) Montelukast (Singulair) Omeprazole (Prilosec) Sertraline (Zoloft)  7 days prior to surgery STOP taking any Aspirin (unless otherwise instructed by your surgeon), Aleve, Naproxen, Ibuprofen, Motrin, Advil, Goody's, BC's, all herbal medications, fish oil, and all vitamins.    The Morning of Surgery  Do not wear jewelry, make-up or nail polish.  Do not wear lotions, powders, or perfumes/colognes, or deodorant  Do not shave 48 hours prior to surgery.  Men may shave face and neck.  Do not bring valuables to the hospital.  [facility] is not responsible for any belongings or valuables.  If you are a smoker, DO NOT Smoke 24 hours prior to surgery IF you wear a CPAP at night please bring your mask, tubing, and machine the morning of surgery   Remember that you must have someone to transport you home  after your surgery, and remain with you for 24 hours if you are discharged the same day.   Contacts, glasses, hearing aids, dentures or bridgework may not be worn into surgery.    Leave your suitcase in the car.  After surgery it may be brought to your room.  For patients admitted to the hospital, discharge time will be determined by your treatment team.  Patients discharged the day of surgery will not be allowed to drive home.    Special instructions:   [place]- Preparing For Surgery  Before surgery, you can play an important role. Because skin is not sterile, your skin needs to be as free of germs as possible. You can reduce the number of germs on your skin by washing with CHG (chlorahexidine gluconate) Soap before surgery.  CHG is an antiseptic cleaner which kills germs and bonds with the skin to continue killing germs even after washing.    Oral Hygiene is also important to reduce your risk of infection.  Remember - BRUSH YOUR TEETH THE MORNING OF SURGERY WITH YOUR REGULAR TOOTHPASTE  Please do not use if you have an allergy to CHG or antibacterial soaps. If your skin becomes reddened/irritated stop using the CHG.  Do not shave (including legs and underarms) for at least 48 hours prior to first CHG shower. It is OK to shave your face.  Please follow these instructions carefully.   1. Shower the NIGHT BEFORE SURGERY and the MORNING OF SURGERY with CHG Soap.   2. If you chose to wash your hair, wash your hair first as usual with your normal shampoo.  3. After you shampoo,  rinse your hair and body thoroughly to remove the shampoo.  4. Use CHG as you would any other liquid soap. You can apply CHG directly to the skin and wash gently with a scrungie or a clean washcloth.   5. Apply the CHG Soap to your body ONLY FROM THE NECK DOWN.  Do not use on open wounds or open sores. Avoid contact with your eyes, ears, mouth and genitals (private parts). Wash Face and genitals (private  parts)  with your normal soap.   6. Wash thoroughly, paying special attention to the area where your surgery will be performed.  7. Thoroughly rinse your body with warm water from the neck down.  8. DO NOT shower/wash with your normal soap after using and rinsing off the CHG Soap.  9. Pat yourself dry with a CLEAN TOWEL.  10. Wear CLEAN PAJAMAS to bed the night before surgery, wear comfortable clothes the morning of surgery  11. Place CLEAN SHEETS on your bed the night of your first shower and DO NOT SLEEP WITH PETS.    Day of Surgery:  Do not apply any deodorants/lotions. Please shower the morning of surgery with the CHG soap  Please wear clean clothes to the hospital/surgery center.   Remember to brush your teeth WITH YOUR REGULAR TOOTHPASTE.   Please read over the following fact sheets that you were given.

## 2019-03-05 ENCOUNTER — Encounter (HOSPITAL_COMMUNITY)
Admission: RE | Admit: 2019-03-05 | Discharge: 2019-03-05 | Disposition: A | Discharge status: Home / Self Care | Payer: No Typology Code available for payment source | Source: Ambulatory Visit | Attending: Orthopaedic Surgery | Admitting: Orthopaedic Surgery

## 2019-03-05 ENCOUNTER — Other Ambulatory Visit (HOSPITAL_COMMUNITY)
Admission: RE | Admit: 2019-03-05 | Discharge: 2019-03-05 | Disposition: A | Discharge status: Home / Self Care | Payer: No Typology Code available for payment source | Source: Ambulatory Visit | Attending: Orthopaedic Surgery | Admitting: Orthopaedic Surgery

## 2019-03-05 ENCOUNTER — Other Ambulatory Visit: Payer: Self-pay

## 2019-03-05 ENCOUNTER — Encounter (HOSPITAL_COMMUNITY): Payer: Self-pay

## 2019-03-05 DIAGNOSIS — R9431 Abnormal electrocardiogram [ECG] [EKG]: Secondary | ICD-10-CM | POA: Insufficient documentation

## 2019-03-05 DIAGNOSIS — I5032 Chronic diastolic (congestive) heart failure: Secondary | ICD-10-CM | POA: Diagnosis not present

## 2019-03-05 DIAGNOSIS — Z01812 Encounter for preprocedural laboratory examination: Secondary | ICD-10-CM | POA: Diagnosis not present

## 2019-03-05 DIAGNOSIS — N183 Chronic kidney disease, stage 3 unspecified: Secondary | ICD-10-CM | POA: Insufficient documentation

## 2019-03-05 DIAGNOSIS — Z791 Long term (current) use of non-steroidal anti-inflammatories (NSAID): Secondary | ICD-10-CM | POA: Diagnosis not present

## 2019-03-05 DIAGNOSIS — M1612 Unilateral primary osteoarthritis, left hip: Secondary | ICD-10-CM | POA: Diagnosis not present

## 2019-03-05 DIAGNOSIS — R000 Tachycardia, unspecified: Secondary | ICD-10-CM | POA: Insufficient documentation

## 2019-03-05 DIAGNOSIS — I130 Hypertensive heart and chronic kidney disease with heart failure and stage 1 through stage 4 chronic kidney disease, or unspecified chronic kidney disease: Secondary | ICD-10-CM | POA: Insufficient documentation

## 2019-03-05 DIAGNOSIS — J45909 Unspecified asthma, uncomplicated: Secondary | ICD-10-CM | POA: Insufficient documentation

## 2019-03-05 DIAGNOSIS — Z7982 Long term (current) use of aspirin: Secondary | ICD-10-CM | POA: Insufficient documentation

## 2019-03-05 DIAGNOSIS — G4733 Obstructive sleep apnea (adult) (pediatric): Secondary | ICD-10-CM | POA: Insufficient documentation

## 2019-03-05 DIAGNOSIS — Z87891 Personal history of nicotine dependence: Secondary | ICD-10-CM | POA: Insufficient documentation

## 2019-03-05 DIAGNOSIS — Z953 Presence of xenogenic heart valve: Secondary | ICD-10-CM | POA: Insufficient documentation

## 2019-03-05 DIAGNOSIS — Z20828 Contact with and (suspected) exposure to other viral communicable diseases: Secondary | ICD-10-CM | POA: Diagnosis not present

## 2019-03-05 DIAGNOSIS — Z79899 Other long term (current) drug therapy: Secondary | ICD-10-CM | POA: Insufficient documentation

## 2019-03-05 DIAGNOSIS — F419 Anxiety disorder, unspecified: Secondary | ICD-10-CM | POA: Insufficient documentation

## 2019-03-05 DIAGNOSIS — D631 Anemia in chronic kidney disease: Secondary | ICD-10-CM | POA: Insufficient documentation

## 2019-03-05 HISTORY — DX: Dyspnea, unspecified: R06.00

## 2019-03-05 LAB — CBC WITH DIFFERENTIAL/PLATELET
Abs Immature Granulocytes: 0.01 10*3/uL (ref 0.00–0.07)
Basophils Absolute: 0 10*3/uL (ref 0.0–0.1)
Basophils Relative: 1 %
Eosinophils Absolute: 0.3 10*3/uL (ref 0.0–0.5)
Eosinophils Relative: 7 %
HCT: 39.2 % (ref 39.0–52.0)
Hemoglobin: 11.9 g/dL — ABNORMAL LOW (ref 13.0–17.0)
Immature Granulocytes: 0 %
Lymphocytes Relative: 30 %
Lymphs Abs: 1.4 10*3/uL (ref 0.7–4.0)
MCH: 24.8 pg — ABNORMAL LOW (ref 26.0–34.0)
MCHC: 30.4 g/dL (ref 30.0–36.0)
MCV: 81.7 fL (ref 80.0–100.0)
Monocytes Absolute: 0.5 10*3/uL (ref 0.1–1.0)
Monocytes Relative: 11 %
Neutro Abs: 2.4 10*3/uL (ref 1.7–7.7)
Neutrophils Relative %: 51 %
Platelets: 265 10*3/uL (ref 150–400)
RBC: 4.8 MIL/uL (ref 4.22–5.81)
RDW: 18.8 % — ABNORMAL HIGH (ref 11.5–15.5)
WBC: 4.6 10*3/uL (ref 4.0–10.5)
nRBC: 0 % (ref 0.0–0.2)

## 2019-03-05 LAB — COMPREHENSIVE METABOLIC PANEL
ALT: 14 U/L (ref 0–44)
AST: 17 U/L (ref 15–41)
Albumin: 3.6 g/dL (ref 3.5–5.0)
Alkaline Phosphatase: 75 U/L (ref 38–126)
Anion gap: 10 (ref 5–15)
BUN: 14 mg/dL (ref 8–23)
CO2: 24 mmol/L (ref 22–32)
Calcium: 9.1 mg/dL (ref 8.9–10.3)
Chloride: 107 mmol/L (ref 98–111)
Creatinine, Ser: 1.77 mg/dL — ABNORMAL HIGH (ref 0.61–1.24)
GFR calc Af Amer: 47 mL/min — ABNORMAL LOW (ref >60)
GFR calc non Af Amer: 41 mL/min — ABNORMAL LOW (ref >60)
Glucose, Bld: 100 mg/dL — ABNORMAL HIGH (ref 70–99)
Potassium: 4.3 mmol/L (ref 3.5–5.1)
Sodium: 141 mmol/L (ref 135–145)
Total Bilirubin: 0.7 mg/dL (ref 0.3–1.2)
Total Protein: 6.9 g/dL (ref 6.5–8.1)

## 2019-03-05 LAB — SURGICAL PCR SCREEN
MRSA, PCR: NEGATIVE
Staphylococcus aureus: POSITIVE — AB

## 2019-03-05 LAB — PROTIME-INR
INR: 1.1 (ref 0.8–1.2)
Prothrombin Time: 13.8 seconds (ref 11.4–15.2)

## 2019-03-05 LAB — APTT: aPTT: 45 seconds — ABNORMAL HIGH (ref 24–36)

## 2019-03-05 NOTE — Progress Notes (Signed)
PCP - na Cardiologist - DR Robb Matar       CLEARANCE IN EPIC     Chest x-ray - 8/20 EKG - 8/20 Stress Test - NA ECHO 8/20-  Cardiac Cath - 5/20  Sleep Study - YES CPAP - YES y  Blood Thinner Instructions: Aspirin Instructions:CONT. ASA PER MD  ERAS Protcol -YES PRE-SURGERY Ensure -YES   COVID TEST- TODAY   Anesthesia review: [place]   Patient denies shortness of breath, fever, cough and chest pain at PAT appointment   Patient verbalized understanding of instructions that were given to them at the PAT appointment. Patient was also instructed that they will need to review over the PAT instructions again at home before surgery.

## 2019-03-05 NOTE — Progress Notes (Signed)
Surgical PCR +MSSA. Will need betadine DOS per protocol.  

## 2019-03-06 ENCOUNTER — Telehealth: Payer: Self-pay | Admitting: Physician Assistant

## 2019-03-06 ENCOUNTER — Telehealth: Payer: Self-pay

## 2019-03-06 ENCOUNTER — Other Ambulatory Visit: Payer: Self-pay

## 2019-03-06 LAB — NOVEL CORONAVIRUS, NAA (HOSP ORDER, SEND-OUT TO REF LAB; TAT 18-24 HRS): SARS-CoV-2, NAA: NOT DETECTED

## 2019-03-06 MED ORDER — MUPIROCIN CALCIUM 2 % NA OINT: 1.0000 "application " | TOPICAL_OINTMENT | Freq: Two times a day (BID) | NASAL | 0 refills | Status: DC

## 2019-03-06 MED ORDER — TRANEXAMIC ACID 1000 MG/10ML IV SOLN
2000.0000 mg | INTRAVENOUS | Status: AC
  Administered 2019-03-09: 2000 mg via TOPICAL
  Filled 2019-03-06: qty 20

## 2019-03-06 MED ORDER — TRANEXAMIC ACID-NACL 1000-0.7 MG/100ML-% IV SOLN
1000.0000 mg | INTRAVENOUS | Status: AC
  Administered 2019-03-09: 1000 mg via INTRAVENOUS
  Filled 2019-03-06: qty 100

## 2019-03-06 NOTE — Telephone Encounter (Signed)
Resent Rx to pharmacy.  Will call patient to advise.

## 2019-03-06 NOTE — Telephone Encounter (Signed)
Patient called advised the Rx was not received at the pharmacy. The number to contact patient is [phone]

## 2019-03-06 NOTE — Progress Notes (Signed)
Anesthesia Chart Review:  Case: [id number] Date/Time: 03/09/19 1316   Procedure: LEFT TOTAL HIP ARTHROPLASTY ANTERIOR APPROACH (Left Hip)   Anesthesia type: Spinal   Pre-op diagnosis: left hip degenerative joint disease   Location: [facility] OR ROOM 04 / [facility]   Surgeon: Leandrew Koyanagi, MD      DISCUSSION: Patient is a 63 year old male scheduled for the above procedure.   History includes former smoker, quadricuspid AR with severe AI (s/p right mini-thoracotomy for minimally invasive AVR, 80mm bovine pericardial tissue valve 12/16/18), post-AVR right pleural effusion/hemothorax (s/p right thoracentesis 12/29/18, 01/04/19), chronic diastolic CHF, dyspnea, asthma, OSA (CPAP), syncope (12/28/18 in the setting of acute respiratory failure/large right pleural effusion/anemia with HGB 6.6), CKD (stage III), anemia, HTN.   Dr. Earnie Larsson signed (on 02/10/19) a cardiac clearance note for surgery, but recommended continuing aspirin.  Case is posted for spinal anesthesia. PT/INR WNL. PTT elevated at 45, but has been elevated in previous labs (PTT 42 on 12/11/18 prior to AVR, 41 on 12/16/18, 45 on 12/28/18, 37 on 12/29/18), so will not plan to repeat. 03/05/19 COVID-19 test negative. Case is posted for spinal anesthesia. PT/PTT reviewed with anesthesiologist Myrtie Soman, MD.    VS: BP 104/65   Pulse 78   Temp (!) 36.2 C   Resp 20   Ht 5\' 11"  (1.803 m)   Wt 107.4 kg   SpO2 98%   BMI 33.03 kg/m   PROVIDERS: Vanetta Mulders, MD is PCP.  Vernell Leep, MD is cardiologist. Last visit 01/15/19. 3 month follow-up recommended. August syncope felt likely due to blood loss anemia. Darylene Price, MD is CT surgeon. Last visit 01/19/19. Two month follow-up recommended.    LABS: Preoperative labs noted. Cr 1.77 (overall seems consistent with prior results; Cr 1.74 prior to AVR; Cr 1.29-2.28 since AVR, primarily ~ 1.40-1.70 range) (all labs ordered are listed, but only abnormal results are displayed)  Labs Reviewed   SURGICAL PCR SCREEN - Abnormal; Notable for the following components:      Result Value   Staphylococcus aureus POSITIVE (*)    All other components within normal limits  APTT - Abnormal; Notable for the following components:   aPTT 45 (*)    All other components within normal limits  CBC WITH DIFFERENTIAL/PLATELET - Abnormal; Notable for the following components:   Hemoglobin 11.9 (*)    MCH 24.8 (*)    RDW 18.8 (*)    All other components within normal limits  COMPREHENSIVE METABOLIC PANEL - Abnormal; Notable for the following components:   Glucose, Bld 100 (*)    Creatinine, Ser 1.77 (*)    GFR calc non Af Amer 41 (*)    GFR calc Af Amer 47 (*)    All other components within normal limits  PROTIME-INR     IMAGES: CXR 01/19/19: COMPARISON: January 12, 2019, January 07, 2019, January 06, 2019 FINDINGS: - Cardiomediastinal silhouette unchanged in size and contour. Surgical changes of minimally invasive aortic valve repair. - Partial obscuration the right hemidiaphragm persists with meniscus on the lateral view. - No pneumothorax.  No interlobular septal thickening. IMPRESSION: Similar appearance of the chest x-ray, with surgical changes of minimally invasive aortic valve repair, right-sided pleural fluid with associated atelectasis/consolidation.   EKG:  EKG 01/04/19 (in setting of right hemothorax): Sinus tachycardia Multiform ventricular premature complexes LVH with secondary repolarization abnormality Anterior ST elevation, probably due to LVH Borderline prolonged QT interval no changes from last ekg Confirmed by Varney Biles [phone]) on 01/04/2019  2:33:40 AM  EKG 12/28/18: Sinus rhythm Ventricular premature complex Anteroseptal infarct, old Repol abnrm suggests ischemia, lateral leads Confirmed by Sherwood Gambler [phone]) on 12/28/2018 3:40:54 PM   CV: Echo 12/29/18: IMPRESSIONS  1. The left ventricle has mildly reduced systolic function, with an ejection fraction  of 45-50%. The cavity size was normal. Left ventricular diastolic parameters were normal. There is abnormal septal motion consistent with post-operative status.  2. The right ventricle has normal systolc function. There is no increase in right ventricular wall thickness.  3. Trivial circumferential pericardial effusion is present.  4. A 108mm an Edwards bovine bioprosthesis valve is present in the aortic position. Procedure Date: 12/16/2018 Normal aortic valve prosthesis. Mean PG 13 mmHg, which is likely normal. No regurgitation.  5. The ascending aorta is normal in size and structure.  6. Compared to previous transthoracic study on 08/27/2018, bioprosthetic aortic valve is new.   Carotid US 12/11/18: Summary: - Right Carotid: Velocities in the right ICA are consistent with a 1-39% stenosis. - Left Carotid: Velocities in the left ICA are consistent with a 1-39% stenosis. - Vertebrals:  Bilateral vertebral arteries demonstrate antegrade flow. - Subclavians: Normal flow hemodynamics were seen in bilateral subclavian arteries.   Cardiac cath 10/14/18 (pre-AVR): LM: Normal LAD: Normal Ramus: Normal LCx: Normal RCA: Normal Angiographically normal coronary arteries with no significant CAD Mild pulmonary hypertnension mean PA 26 mmHg   Past Medical History:  Diagnosis Date  . Acute on chronic diastolic heart failure ([place])   . Anemia in chronic kidney disease   . Anxiety   . Aortic insufficiency   . Arthritis   . Asthma   . CHF (congestive heart failure) ([place])   . CKD (chronic kidney disease)   . Dyspnea   . Essential hypertension   . Nerve pain   . Non-rheumatic aortic regurgitation   . Obstructive sleep apnea    cpap  . Quadricuspid aortic valve   . S/P minimally invasive aortic valve replacement with bioprosthetic valve 12/16/2018   25 mm Edwards Inspiris Resilia stented bovine pericardial tissue valve via right mini thoracotomy approach  . Severe aortic insufficiency   . SOB  (shortness of breath)   . Syncope 12/2018    Past Surgical History:  Procedure Laterality Date  . AORTIC VALVE REPLACEMENT N/A 12/16/2018   Procedure: MINIMALLY INVASIVE AORTIC VALVE REPLACEMENT (AVR) using Inspiris Aortic valve 83mm.;  Surgeon: Rexene Alberts, MD;  Location: [facility];  Service: Open Heart Surgery;  Laterality: N/A;  . HERNIA REPAIR    . IR THORACENTESIS ASP PLEURAL SPACE W/IMG GUIDE  12/29/2018  . RIB PLATING  12/16/2018   Procedure: Rib Plating;  Surgeon: Rexene Alberts, MD;  Location: [facility] OR;  Service: Open Heart Surgery;;  . RIGHT/LEFT HEART CATH AND CORONARY ANGIOGRAPHY N/A 10/14/2018   Procedure: RIGHT/LEFT HEART CATH AND CORONARY ANGIOGRAPHY;  Surgeon: Nigel Mormon, MD;  Location: [facility] LAB;  Service: Cardiovascular;  Laterality: N/A;  . TEE WITHOUT CARDIOVERSION N/A 11/11/2018   Procedure: TRANSESOPHAGEAL ECHOCARDIOGRAM (TEE);  Surgeon: Nigel Mormon, MD;  Location: [facility] ENDOSCOPY;  Service: Cardiovascular;  Laterality: N/A;  . TEE WITHOUT CARDIOVERSION N/A 12/16/2018   Procedure: TRANSESOPHAGEAL ECHOCARDIOGRAM (TEE);  Surgeon: Rexene Alberts, MD;  Location: [facility];  Service: Open Heart Surgery;  Laterality: N/A;    MEDICATIONS: . albuterol (PROVENTIL HFA;VENTOLIN HFA) 108 (90 Base) MCG/ACT inhaler  . albuterol (PROVENTIL) (2.5 MG/3ML) 0.083% nebulizer solution  . amLODipine (NORVASC) 10 MG tablet  .  aspirin EC 81 MG tablet  . budesonide-formoterol (SYMBICORT) 160-4.5 MCG/ACT inhaler  . Cholecalciferol (VITAMIN D3) 50 MCG (2000 UT) TABS  . COMBIVENT RESPIMAT 20-100 MCG/ACT AERS respimat  . dextromethorphan-guaiFENesin (MUCINEX DM) 30-600 MG 12hr tablet  . fluticasone (FLONASE) 50 MCG/ACT nasal spray  . furosemide (LASIX) 40 MG tablet  . gabapentin (NEURONTIN) 400 MG capsule  . hydrOXYzine (VISTARIL) 25 MG capsule  . losartan (COZAAR) 25 MG tablet  . losartan (COZAAR) 50 MG tablet  . meloxicam (MOBIC) 15 MG tablet  . metoprolol tartrate  (LOPRESSOR) 25 MG tablet  . metoprolol tartrate (LOPRESSOR) 50 MG tablet  . montelukast (SINGULAIR) 10 MG tablet  . omeprazole (PRILOSEC) 20 MG capsule  . omeprazole (PRILOSEC) 40 MG capsule  . rosuvastatin (CRESTOR) 10 MG tablet  . sertraline (ZOLOFT) 50 MG tablet   No current facility-administered medications for this encounter.      Myra Gianotti, PA-C Surgical Short Stay/Anesthesiology [facility] Phone [phone] [facility] Phone [phone] 03/06/2019 4:37 PM

## 2019-03-06 NOTE — Telephone Encounter (Signed)
Called and left VM advising patient that Rx was resent to his pharmacy.  Advised to call the office back if pharmacy has not received Rx.

## 2019-03-06 NOTE — Telephone Encounter (Signed)
Can one of you guys send this in please

## 2019-03-06 NOTE — Telephone Encounter (Signed)
Talked with pharmacist concerning Rx for patient.  Per Dr. Junius Roads, okay to dispense 22g, due to pharmacy not having 10g.

## 2019-03-06 NOTE — Anesthesia Preprocedure Evaluation (Addendum)
Anesthesia Evaluation  Patient identified by MRN, date of birth, ID band Patient awake    Reviewed: Allergy & Precautions, NPO status , Patient's Chart, lab work & pertinent test results, reviewed documented beta blocker date and time   Airway Mallampati: II  TM Distance: >3 FB Neck ROM: Full    Dental  (+) Teeth Intact, Dental Advisory Given   Pulmonary asthma , sleep apnea and Continuous Positive Airway Pressure Ventilation , former smoker,    Pulmonary exam normal breath sounds clear to auscultation       Cardiovascular hypertension, Pt. on home beta blockers and Pt. on medications (-) angina+CHF  (-) CAD and (-) Past MI Normal cardiovascular exam+ Valvular Problems/Murmurs (s/p AVR) AI  Rhythm:Regular Rate:Normal  Echo 12/2018:  1. The left ventricle has mildly reduced systolic function, with an ejection fraction of 45-50%. The cavity size was normal. Left ventricular diastolic parameters were normal. There is abnormal septal motion consistent with post-operative status.  2. The right ventricle has normal systolc function. There is no increase in right ventricular wall thickness.  3. Trivial circumferential pericardial effusion is present.  4. A 67mm an Edwards bovine bioprosthesis valve is present in the aortic position. Procedure Date: 12/16/2018 Normal aortic valve prosthesis. Mean PG 13 mmHg, which is likely normal. No regurgitation.  5. The ascending aorta is normal in size and structure.  6. Compared to previous transthoracic study on 08/27/2018, bioprosthetic aortic valve is new.   Neuro/Psych PSYCHIATRIC DISORDERS Anxiety Depression negative neurological ROS     GI/Hepatic Neg liver ROS, GERD  Medicated,  Endo/Other  diabetes, Type 2Obesity   Renal/GU Renal InsufficiencyRenal disease     Musculoskeletal  (+) Arthritis , Left hip degenerative joint disease   Abdominal   Peds  Hematology  (+) Blood dyscrasia,  anemia ,   Anesthesia Other Findings Day of surgery medications reviewed with the patient.  Reproductive/Obstetrics                           Anesthesia Physical Anesthesia Plan  ASA: III  Anesthesia Plan: Spinal   Post-op Pain Management:    Induction:   PONV Risk Score and Plan: 1 and Propofol infusion and Treatment may vary due to age or medical condition  Airway Management Planned: Natural Airway and Nasal Cannula  Additional Equipment:   Intra-op Plan:   Post-operative Plan:   Informed Consent: I have reviewed the patients History and Physical, chart, labs and discussed the procedure including the risks, benefits and alternatives for the proposed anesthesia with the patient or authorized representative who has indicated his/her understanding and acceptance.     Dental advisory given  Plan Discussed with: CRNA, Anesthesiologist and Surgeon  Anesthesia Plan Comments: (PAT note written 03/06/2019 by Myra Gianotti, PA-C. )       Anesthesia Quick Evaluation

## 2019-03-06 NOTE — Telephone Encounter (Signed)
See message below.  Thank you. Could you please resend Rx for Mupirocin Nasal Ointment to patient's pharmacy?  Pharmacy has not received Rx.

## 2019-03-06 NOTE — Progress Notes (Signed)
Will you let patient know nasal swab positive for mssa and he needs to start mupirocin asap.  Will you also call this in please and thank you

## 2019-03-09 ENCOUNTER — Ambulatory Visit (HOSPITAL_COMMUNITY): Payer: No Typology Code available for payment source

## 2019-03-09 ENCOUNTER — Ambulatory Visit (HOSPITAL_COMMUNITY): Payer: No Typology Code available for payment source | Admitting: Anesthesiology

## 2019-03-09 ENCOUNTER — Observation Stay (HOSPITAL_COMMUNITY): Payer: No Typology Code available for payment source

## 2019-03-09 ENCOUNTER — Encounter (HOSPITAL_COMMUNITY)
Admission: RE | Disposition: A | Discharge status: Home / Self Care | Payer: Self-pay | Source: Home / Self Care | Attending: Orthopaedic Surgery

## 2019-03-09 ENCOUNTER — Encounter (HOSPITAL_COMMUNITY): Payer: Self-pay | Admitting: *Deleted

## 2019-03-09 ENCOUNTER — Ambulatory Visit (HOSPITAL_COMMUNITY): Payer: No Typology Code available for payment source | Admitting: Physician Assistant

## 2019-03-09 ENCOUNTER — Other Ambulatory Visit: Payer: Self-pay

## 2019-03-09 ENCOUNTER — Observation Stay (HOSPITAL_COMMUNITY)
Admission: RE | Admit: 2019-03-09 | Discharge: 2019-03-11 | Disposition: A | Discharge status: Home / Self Care | Payer: No Typology Code available for payment source | Attending: Orthopaedic Surgery | Admitting: Orthopaedic Surgery

## 2019-03-09 DIAGNOSIS — Z79899 Other long term (current) drug therapy: Secondary | ICD-10-CM | POA: Insufficient documentation

## 2019-03-09 DIAGNOSIS — F329 Major depressive disorder, single episode, unspecified: Secondary | ICD-10-CM | POA: Diagnosis not present

## 2019-03-09 DIAGNOSIS — J45909 Unspecified asthma, uncomplicated: Secondary | ICD-10-CM | POA: Insufficient documentation

## 2019-03-09 DIAGNOSIS — G4733 Obstructive sleep apnea (adult) (pediatric): Secondary | ICD-10-CM | POA: Insufficient documentation

## 2019-03-09 DIAGNOSIS — E1122 Type 2 diabetes mellitus with diabetic chronic kidney disease: Secondary | ICD-10-CM | POA: Diagnosis not present

## 2019-03-09 DIAGNOSIS — Z809 Family history of malignant neoplasm, unspecified: Secondary | ICD-10-CM | POA: Insufficient documentation

## 2019-03-09 DIAGNOSIS — I5033 Acute on chronic diastolic (congestive) heart failure: Secondary | ICD-10-CM | POA: Insufficient documentation

## 2019-03-09 DIAGNOSIS — Z825 Family history of asthma and other chronic lower respiratory diseases: Secondary | ICD-10-CM | POA: Insufficient documentation

## 2019-03-09 DIAGNOSIS — K219 Gastro-esophageal reflux disease without esophagitis: Secondary | ICD-10-CM | POA: Insufficient documentation

## 2019-03-09 DIAGNOSIS — Z7951 Long term (current) use of inhaled steroids: Secondary | ICD-10-CM | POA: Insufficient documentation

## 2019-03-09 DIAGNOSIS — I130 Hypertensive heart and chronic kidney disease with heart failure and stage 1 through stage 4 chronic kidney disease, or unspecified chronic kidney disease: Secondary | ICD-10-CM | POA: Insufficient documentation

## 2019-03-09 DIAGNOSIS — Z791 Long term (current) use of non-steroidal anti-inflammatories (NSAID): Secondary | ICD-10-CM | POA: Diagnosis not present

## 2019-03-09 DIAGNOSIS — R55 Syncope and collapse: Secondary | ICD-10-CM | POA: Diagnosis not present

## 2019-03-09 DIAGNOSIS — Z953 Presence of xenogenic heart valve: Secondary | ICD-10-CM | POA: Diagnosis not present

## 2019-03-09 DIAGNOSIS — Z96649 Presence of unspecified artificial hip joint: Secondary | ICD-10-CM

## 2019-03-09 DIAGNOSIS — M1612 Unilateral primary osteoarthritis, left hip: Secondary | ICD-10-CM | POA: Diagnosis present

## 2019-03-09 DIAGNOSIS — F419 Anxiety disorder, unspecified: Secondary | ICD-10-CM | POA: Insufficient documentation

## 2019-03-09 DIAGNOSIS — Z96642 Presence of left artificial hip joint: Secondary | ICD-10-CM

## 2019-03-09 DIAGNOSIS — D631 Anemia in chronic kidney disease: Secondary | ICD-10-CM | POA: Insufficient documentation

## 2019-03-09 DIAGNOSIS — Z87891 Personal history of nicotine dependence: Secondary | ICD-10-CM | POA: Diagnosis not present

## 2019-03-09 DIAGNOSIS — N189 Chronic kidney disease, unspecified: Secondary | ICD-10-CM | POA: Insufficient documentation

## 2019-03-09 DIAGNOSIS — Z7982 Long term (current) use of aspirin: Secondary | ICD-10-CM | POA: Diagnosis not present

## 2019-03-09 DIAGNOSIS — Z419 Encounter for procedure for purposes other than remedying health state, unspecified: Secondary | ICD-10-CM

## 2019-03-09 HISTORY — PX: TOTAL HIP ARTHROPLASTY: SHX124

## 2019-03-09 LAB — TYPE AND SCREEN
ABO/RH(D): O POS
Antibody Screen: NEGATIVE

## 2019-03-09 LAB — GLUCOSE, CAPILLARY: Glucose-Capillary: 96 mg/dL (ref 70–99)

## 2019-03-09 SURGERY — ARTHROPLASTY, HIP, TOTAL, ANTERIOR APPROACH
Anesthesia: Spinal | Site: Hip | Laterality: Left

## 2019-03-09 MED ORDER — LIDOCAINE 2% (20 MG/ML) 5 ML SYRINGE
INTRAMUSCULAR | Status: DC | PRN
  Administered 2019-03-09: 50 mg via INTRAVENOUS

## 2019-03-09 MED ORDER — METOPROLOL TARTRATE 25 MG PO TABS
25.0000 mg | ORAL_TABLET | Freq: Two times a day (BID) | ORAL | Status: DC
  Administered 2019-03-09 – 2019-03-11 (×3): 25 mg via ORAL
  Filled 2019-03-09 (×3): qty 1

## 2019-03-09 MED ORDER — ONDANSETRON HCL 4 MG/2ML IJ SOLN: 4.0000 mg | Freq: Once | INTRAMUSCULAR | Status: DC | PRN

## 2019-03-09 MED ORDER — ATROPINE SULFATE 1 MG/10ML IJ SOSY
PREFILLED_SYRINGE | INTRAMUSCULAR | Status: AC
  Filled 2019-03-09: qty 10

## 2019-03-09 MED ORDER — ASPIRIN 81 MG PO CHEW
81.0000 mg | CHEWABLE_TABLET | Freq: Two times a day (BID) | ORAL | Status: DC
  Administered 2019-03-09 – 2019-03-11 (×4): 81 mg via ORAL
  Filled 2019-03-09 (×4): qty 1

## 2019-03-09 MED ORDER — ALBUTEROL SULFATE (2.5 MG/3ML) 0.083% IN NEBU: 2.5000 mg | INHALATION_SOLUTION | Freq: Four times a day (QID) | RESPIRATORY_TRACT | Status: DC | PRN

## 2019-03-09 MED ORDER — GABAPENTIN 300 MG PO CAPS: 300.0000 mg | ORAL_CAPSULE | Freq: Three times a day (TID) | ORAL | Status: DC

## 2019-03-09 MED ORDER — SODIUM CHLORIDE 0.9 % IV SOLN
INTRAVENOUS | Status: DC | PRN
  Administered 2019-03-09: 60 ug/min via INTRAVENOUS

## 2019-03-09 MED ORDER — KETOROLAC TROMETHAMINE 15 MG/ML IJ SOLN
15.0000 mg | Freq: Four times a day (QID) | INTRAMUSCULAR | Status: AC
  Administered 2019-03-09 – 2019-03-10 (×4): 15 mg via INTRAVENOUS
  Filled 2019-03-09 (×3): qty 1

## 2019-03-09 MED ORDER — ACETAMINOPHEN 325 MG PO TABS: 325.0000 mg | ORAL_TABLET | Freq: Four times a day (QID) | ORAL | Status: DC | PRN

## 2019-03-09 MED ORDER — MIDAZOLAM HCL 2 MG/2ML IJ SOLN
INTRAMUSCULAR | Status: AC
  Filled 2019-03-09: qty 2

## 2019-03-09 MED ORDER — DIPHENHYDRAMINE HCL 12.5 MG/5ML PO ELIX: 25.0000 mg | ORAL_SOLUTION | ORAL | Status: DC | PRN

## 2019-03-09 MED ORDER — ONDANSETRON HCL 4 MG/2ML IJ SOLN: 4.0000 mg | Freq: Four times a day (QID) | INTRAMUSCULAR | Status: DC | PRN

## 2019-03-09 MED ORDER — ACETAMINOPHEN 500 MG PO TABS
1000.0000 mg | ORAL_TABLET | Freq: Four times a day (QID) | ORAL | Status: AC
  Administered 2019-03-09 – 2019-03-10 (×4): 1000 mg via ORAL
  Filled 2019-03-09 (×5): qty 2

## 2019-03-09 MED ORDER — ALUM & MAG HYDROXIDE-SIMETH 200-200-20 MG/5ML PO SUSP: 30.0000 mL | ORAL | Status: DC | PRN

## 2019-03-09 MED ORDER — VANCOMYCIN HCL 1000 MG IV SOLR
INTRAVENOUS | Status: AC
  Filled 2019-03-09: qty 1000

## 2019-03-09 MED ORDER — PROPOFOL 10 MG/ML IV BOLUS
INTRAVENOUS | Status: AC
  Filled 2019-03-09: qty 20

## 2019-03-09 MED ORDER — FENTANYL CITRATE (PF) 100 MCG/2ML IJ SOLN
INTRAMUSCULAR | Status: DC | PRN
  Administered 2019-03-09: 25 ug via INTRAVENOUS

## 2019-03-09 MED ORDER — MONTELUKAST SODIUM 10 MG PO TABS
10.0000 mg | ORAL_TABLET | Freq: Every day | ORAL | Status: DC
  Administered 2019-03-10 – 2019-03-11 (×2): 10 mg via ORAL
  Filled 2019-03-09 (×2): qty 1

## 2019-03-09 MED ORDER — OXYCODONE HCL 5 MG PO TABS
ORAL_TABLET | ORAL | Status: AC
  Filled 2019-03-09: qty 2

## 2019-03-09 MED ORDER — DOCUSATE SODIUM 100 MG PO CAPS
100.0000 mg | ORAL_CAPSULE | Freq: Two times a day (BID) | ORAL | Status: DC
  Administered 2019-03-09 – 2019-03-11 (×4): 100 mg via ORAL
  Filled 2019-03-09 (×4): qty 1

## 2019-03-09 MED ORDER — MENTHOL 3 MG MT LOZG: 1.0000 | LOZENGE | OROMUCOSAL | Status: DC | PRN

## 2019-03-09 MED ORDER — ATROPINE SULFATE 1 MG/ML IJ SOLN
INTRAMUSCULAR | Status: AC
  Filled 2019-03-09: qty 1

## 2019-03-09 MED ORDER — SERTRALINE HCL 50 MG PO TABS
50.0000 mg | ORAL_TABLET | Freq: Every day | ORAL | Status: DC
  Administered 2019-03-10 – 2019-03-11 (×2): 50 mg via ORAL
  Filled 2019-03-09 (×2): qty 1

## 2019-03-09 MED ORDER — PHENOL 1.4 % MT LIQD: 1.0000 | OROMUCOSAL | Status: DC | PRN

## 2019-03-09 MED ORDER — GABAPENTIN 400 MG PO CAPS
400.0000 mg | ORAL_CAPSULE | Freq: Three times a day (TID) | ORAL | Status: DC
  Administered 2019-03-09 – 2019-03-11 (×5): 400 mg via ORAL
  Filled 2019-03-09 (×5): qty 1

## 2019-03-09 MED ORDER — METOCLOPRAMIDE HCL 5 MG PO TABS: 5.0000 mg | ORAL_TABLET | Freq: Three times a day (TID) | ORAL | Status: DC | PRN

## 2019-03-09 MED ORDER — ACETAMINOPHEN 500 MG PO TABS
1000.0000 mg | ORAL_TABLET | Freq: Once | ORAL | Status: AC
  Administered 2019-03-09: 1000 mg via ORAL
  Filled 2019-03-09: qty 2

## 2019-03-09 MED ORDER — PROPOFOL 10 MG/ML IV BOLUS
INTRAVENOUS | Status: DC | PRN
  Administered 2019-03-09: 40 mg via INTRAVENOUS

## 2019-03-09 MED ORDER — AMLODIPINE BESYLATE 10 MG PO TABS
10.0000 mg | ORAL_TABLET | Freq: Every day | ORAL | Status: DC
  Administered 2019-03-11: 10 mg via ORAL
  Filled 2019-03-09: qty 1

## 2019-03-09 MED ORDER — MIDAZOLAM HCL 5 MG/5ML IJ SOLN
INTRAMUSCULAR | Status: DC | PRN
  Administered 2019-03-09: 2 mg via INTRAVENOUS

## 2019-03-09 MED ORDER — METHOCARBAMOL 1000 MG/10ML IJ SOLN
500.0000 mg | Freq: Four times a day (QID) | INTRAVENOUS | Status: DC | PRN
  Filled 2019-03-09: qty 5

## 2019-03-09 MED ORDER — ALBUMIN HUMAN 5 % IV SOLN
INTRAVENOUS | Status: AC
  Administered 2019-03-09: 12.5 g
  Filled 2019-03-09: qty 250

## 2019-03-09 MED ORDER — CEFAZOLIN SODIUM-DEXTROSE 2-4 GM/100ML-% IV SOLN
2.0000 g | Freq: Four times a day (QID) | INTRAVENOUS | Status: AC
  Administered 2019-03-09 – 2019-03-10 (×3): 2 g via INTRAVENOUS
  Filled 2019-03-09 (×4): qty 100

## 2019-03-09 MED ORDER — METOPROLOL TARTRATE 50 MG PO TABS: 50.0000 mg | ORAL_TABLET | Freq: Two times a day (BID) | ORAL | Status: DC

## 2019-03-09 MED ORDER — PHENYLEPHRINE 40 MCG/ML (10ML) SYRINGE FOR IV PUSH (FOR BLOOD PRESSURE SUPPORT)
PREFILLED_SYRINGE | INTRAVENOUS | Status: AC
  Filled 2019-03-09: qty 10

## 2019-03-09 MED ORDER — METHOCARBAMOL 500 MG PO TABS
ORAL_TABLET | ORAL | Status: AC
  Filled 2019-03-09: qty 1

## 2019-03-09 MED ORDER — PHENYLEPHRINE 40 MCG/ML (10ML) SYRINGE FOR IV PUSH (FOR BLOOD PRESSURE SUPPORT)
PREFILLED_SYRINGE | INTRAVENOUS | Status: DC | PRN
  Administered 2019-03-09: 40 ug via INTRAVENOUS
  Administered 2019-03-09: 80 ug via INTRAVENOUS

## 2019-03-09 MED ORDER — FENTANYL CITRATE (PF) 100 MCG/2ML IJ SOLN: 25.0000 ug | INTRAMUSCULAR | Status: DC | PRN

## 2019-03-09 MED ORDER — VANCOMYCIN HCL 1 G IV SOLR
INTRAVENOUS | Status: DC | PRN
  Administered 2019-03-09: 1000 mg via TOPICAL

## 2019-03-09 MED ORDER — LACTATED RINGERS IV SOLN
INTRAVENOUS | Status: DC
  Administered 2019-03-09: 12:00:00 via INTRAVENOUS

## 2019-03-09 MED ORDER — CHLORHEXIDINE GLUCONATE 4 % EX LIQD: 60.0000 mL | Freq: Once | CUTANEOUS | Status: DC

## 2019-03-09 MED ORDER — LOSARTAN POTASSIUM 50 MG PO TABS
25.0000 mg | ORAL_TABLET | Freq: Every day | ORAL | Status: DC
  Administered 2019-03-11: 25 mg via ORAL
  Filled 2019-03-09: qty 1

## 2019-03-09 MED ORDER — MUPIROCIN CALCIUM 2 % NA OINT
1.0000 "application " | TOPICAL_OINTMENT | Freq: Two times a day (BID) | NASAL | Status: DC
  Filled 2019-03-09 (×2): qty 1

## 2019-03-09 MED ORDER — ALBUMIN HUMAN 5 % IV SOLN
12.5000 g | Freq: Once | INTRAVENOUS | Status: AC
  Administered 2019-03-09: 12.5 g via INTRAVENOUS

## 2019-03-09 MED ORDER — MAGNESIUM CITRATE PO SOLN: 1.0000 | Freq: Once | ORAL | Status: DC | PRN

## 2019-03-09 MED ORDER — 0.9 % SODIUM CHLORIDE (POUR BTL) OPTIME
TOPICAL | Status: DC | PRN
  Administered 2019-03-09: 1000 mL

## 2019-03-09 MED ORDER — ONDANSETRON HCL 4 MG/2ML IJ SOLN
INTRAMUSCULAR | Status: DC | PRN
  Administered 2019-03-09: 4 mg via INTRAVENOUS

## 2019-03-09 MED ORDER — IPRATROPIUM-ALBUTEROL 20-100 MCG/ACT IN AERS: 2.0000 | INHALATION_SPRAY | Freq: Every day | RESPIRATORY_TRACT | Status: DC | PRN

## 2019-03-09 MED ORDER — OXYCODONE HCL 5 MG PO TABS
5.0000 mg | ORAL_TABLET | ORAL | Status: DC | PRN
  Administered 2019-03-09: 10 mg via ORAL
  Administered 2019-03-10: 5 mg via ORAL
  Filled 2019-03-09: qty 1

## 2019-03-09 MED ORDER — SORBITOL 70 % SOLN: 30.0000 mL | Freq: Every day | Status: DC | PRN

## 2019-03-09 MED ORDER — POLYETHYLENE GLYCOL 3350 17 G PO PACK: 17.0000 g | PACK | Freq: Every day | ORAL | Status: DC | PRN

## 2019-03-09 MED ORDER — ALBUMIN HUMAN 5 % IV SOLN: 12.5000 g | Freq: Once | INTRAVENOUS | Status: DC

## 2019-03-09 MED ORDER — LACTATED RINGERS IV SOLN
INTRAVENOUS | Status: DC | PRN
  Administered 2019-03-09 (×2): via INTRAVENOUS

## 2019-03-09 MED ORDER — PANTOPRAZOLE SODIUM 40 MG PO TBEC
40.0000 mg | DELAYED_RELEASE_TABLET | Freq: Every day | ORAL | Status: DC
  Administered 2019-03-10 – 2019-03-11 (×2): 40 mg via ORAL
  Filled 2019-03-09 (×2): qty 1

## 2019-03-09 MED ORDER — CEFAZOLIN SODIUM-DEXTROSE 2-4 GM/100ML-% IV SOLN
2.0000 g | INTRAVENOUS | Status: AC
  Administered 2019-03-09: 2 g via INTRAVENOUS
  Filled 2019-03-09: qty 100

## 2019-03-09 MED ORDER — FLUTICASONE PROPIONATE 50 MCG/ACT NA SUSP: 2.0000 | Freq: Every day | NASAL | Status: DC | PRN

## 2019-03-09 MED ORDER — ONDANSETRON HCL 4 MG/2ML IJ SOLN
INTRAMUSCULAR | Status: AC
  Filled 2019-03-09: qty 2

## 2019-03-09 MED ORDER — DEXAMETHASONE SODIUM PHOSPHATE 4 MG/ML IJ SOLN
INTRAMUSCULAR | Status: DC | PRN
  Administered 2019-03-09: 8 mg via INTRAVENOUS

## 2019-03-09 MED ORDER — SODIUM CHLORIDE 0.9 % IR SOLN
Status: DC | PRN
  Administered 2019-03-09: 3000 mL

## 2019-03-09 MED ORDER — HYDROMORPHONE HCL 1 MG/ML IJ SOLN
0.5000 mg | INTRAMUSCULAR | Status: DC | PRN
  Administered 2019-03-10 – 2019-03-11 (×3): 1 mg via INTRAVENOUS
  Filled 2019-03-09 (×3): qty 1

## 2019-03-09 MED ORDER — ALBUMIN HUMAN 5 % IV SOLN
INTRAVENOUS | Status: AC
  Filled 2019-03-09: qty 250

## 2019-03-09 MED ORDER — ONDANSETRON HCL 4 MG PO TABS: 4.0000 mg | ORAL_TABLET | Freq: Four times a day (QID) | ORAL | Status: DC | PRN

## 2019-03-09 MED ORDER — PROPOFOL 500 MG/50ML IV EMUL
INTRAVENOUS | Status: DC | PRN
  Administered 2019-03-09: 65 ug/kg/min via INTRAVENOUS

## 2019-03-09 MED ORDER — OXYCODONE HCL ER 10 MG PO T12A
10.0000 mg | EXTENDED_RELEASE_TABLET | Freq: Two times a day (BID) | ORAL | Status: DC
  Administered 2019-03-09 – 2019-03-11 (×4): 10 mg via ORAL
  Filled 2019-03-09 (×4): qty 1

## 2019-03-09 MED ORDER — LACTATED RINGERS IV BOLUS
1000.0000 mL | Freq: Once | INTRAVENOUS | Status: AC
  Administered 2019-03-09: 1000 mL via INTRAVENOUS

## 2019-03-09 MED ORDER — HYDROXYZINE HCL 25 MG PO TABS
25.0000 mg | ORAL_TABLET | Freq: Three times a day (TID) | ORAL | Status: DC
  Administered 2019-03-09 – 2019-03-11 (×5): 25 mg via ORAL
  Filled 2019-03-09 (×5): qty 1

## 2019-03-09 MED ORDER — METOCLOPRAMIDE HCL 5 MG/ML IJ SOLN: 5.0000 mg | Freq: Three times a day (TID) | INTRAMUSCULAR | Status: DC | PRN

## 2019-03-09 MED ORDER — LIDOCAINE 2% (20 MG/ML) 5 ML SYRINGE
INTRAMUSCULAR | Status: AC
  Filled 2019-03-09: qty 5

## 2019-03-09 MED ORDER — KETOROLAC TROMETHAMINE 15 MG/ML IJ SOLN
INTRAMUSCULAR | Status: AC
  Filled 2019-03-09: qty 1

## 2019-03-09 MED ORDER — FUROSEMIDE 40 MG PO TABS
40.0000 mg | ORAL_TABLET | Freq: Every day | ORAL | Status: DC
  Administered 2019-03-10 – 2019-03-11 (×2): 40 mg via ORAL
  Filled 2019-03-09 (×2): qty 1

## 2019-03-09 MED ORDER — DEXAMETHASONE SODIUM PHOSPHATE 10 MG/ML IJ SOLN
INTRAMUSCULAR | Status: AC
  Filled 2019-03-09: qty 1

## 2019-03-09 MED ORDER — POVIDONE-IODINE 10 % EX SWAB: 2.0000 "application " | Freq: Once | CUTANEOUS | Status: DC

## 2019-03-09 MED ORDER — MUPIROCIN CALCIUM 2 % EX CREA
TOPICAL_CREAM | Freq: Two times a day (BID) | CUTANEOUS | Status: DC
  Filled 2019-03-09: qty 15

## 2019-03-09 MED ORDER — TRANEXAMIC ACID-NACL 1000-0.7 MG/100ML-% IV SOLN
1000.0000 mg | Freq: Once | INTRAVENOUS | Status: AC
  Administered 2019-03-09: 1000 mg via INTRAVENOUS
  Filled 2019-03-09: qty 100

## 2019-03-09 MED ORDER — LACTATED RINGERS IV SOLN: INTRAVENOUS | Status: DC

## 2019-03-09 MED ORDER — DEXAMETHASONE SODIUM PHOSPHATE 10 MG/ML IJ SOLN
10.0000 mg | Freq: Once | INTRAMUSCULAR | Status: AC
  Administered 2019-03-10: 10 mg via INTRAVENOUS
  Filled 2019-03-09: qty 1

## 2019-03-09 MED ORDER — FENTANYL CITRATE (PF) 250 MCG/5ML IJ SOLN
INTRAMUSCULAR | Status: AC
  Filled 2019-03-09: qty 5

## 2019-03-09 MED ORDER — METHOCARBAMOL 750 MG PO TABS
750.0000 mg | ORAL_TABLET | Freq: Four times a day (QID) | ORAL | Status: DC | PRN
  Administered 2019-03-09: 750 mg via ORAL
  Filled 2019-03-09: qty 1

## 2019-03-09 MED ORDER — ALBUTEROL SULFATE HFA 108 (90 BASE) MCG/ACT IN AERS: 2.0000 | INHALATION_SPRAY | Freq: Four times a day (QID) | RESPIRATORY_TRACT | Status: DC | PRN

## 2019-03-09 MED ORDER — OXYCODONE HCL 5 MG PO TABS: 10.0000 mg | ORAL_TABLET | ORAL | Status: DC | PRN

## 2019-03-09 MED ORDER — SODIUM CHLORIDE 0.9 % IV SOLN
INTRAVENOUS | Status: DC
  Administered 2019-03-09: 20:00:00 via INTRAVENOUS

## 2019-03-09 SURGICAL SUPPLY — 62 items
ACETAB CUP W GRIPTION 54MM (Plate) ×1 IMPLANT
ACETAB CUP W/GRIPTION 54 (Plate) ×2 IMPLANT
BAG DECANTER FOR FLEXI CONT (MISCELLANEOUS) ×3 IMPLANT
CELLS DAT CNTRL 66122 CELL SVR (MISCELLANEOUS) ×1 IMPLANT
CLOSURE STERI-STRIP 1/2X4 (GAUZE/BANDAGES/DRESSINGS) ×2
CLSR STERI-STRIP ANTIMIC 1/2X4 (GAUZE/BANDAGES/DRESSINGS) ×2 IMPLANT
COVER PERINEAL POST (MISCELLANEOUS) ×3 IMPLANT
COVER SURGICAL LIGHT HANDLE (MISCELLANEOUS) ×3 IMPLANT
COVER WAND RF STERILE (DRAPES) ×1 IMPLANT
CUP ACETAB W/GRIPTION 54 (Plate) IMPLANT
DRAPE C-ARM 42X72 X-RAY (DRAPES) ×3 IMPLANT
DRAPE POUCH INSTRU U-SHP 10X18 (DRAPES) ×3 IMPLANT
DRAPE STERI IOBAN 125X83 (DRAPES) ×3 IMPLANT
DRAPE U-SHAPE 47X51 STRL (DRAPES) ×6 IMPLANT
DRSG AQUACEL AG ADV 3.5X10 (GAUZE/BANDAGES/DRESSINGS) ×3 IMPLANT
DURAPREP 26ML APPLICATOR (WOUND CARE) ×8 IMPLANT
ELECT BLADE 4.0 EZ CLEAN MEGAD (MISCELLANEOUS) ×3
ELECT REM PT RETURN 9FT ADLT (ELECTROSURGICAL) ×3
ELECTRODE BLDE 4.0 EZ CLN MEGD (MISCELLANEOUS) ×1 IMPLANT
ELECTRODE REM PT RTRN 9FT ADLT (ELECTROSURGICAL) ×1 IMPLANT
GLOVE BIOGEL PI IND STRL 7.0 (GLOVE) ×1 IMPLANT
GLOVE BIOGEL PI INDICATOR 7.0 (GLOVE) ×2
GLOVE ECLIPSE 7.0 STRL STRAW (GLOVE) ×6 IMPLANT
GLOVE SKINSENSE NS SZ7.5 (GLOVE) ×2
GLOVE SKINSENSE STRL SZ7.5 (GLOVE) ×1 IMPLANT
GLOVE SURG SYN 7.5  E (GLOVE) ×8
GLOVE SURG SYN 7.5 E (GLOVE) ×4 IMPLANT
GLOVE SURG SYN 7.5 PF PI (GLOVE) ×4 IMPLANT
GOWN STRL REIN XL XLG (GOWN DISPOSABLE) ×5 IMPLANT
GOWN STRL REUS W/ TWL LRG LVL3 (GOWN DISPOSABLE) IMPLANT
GOWN STRL REUS W/ TWL XL LVL3 (GOWN DISPOSABLE) ×1 IMPLANT
GOWN STRL REUS W/TWL LRG LVL3 (GOWN DISPOSABLE) ×6
GOWN STRL REUS W/TWL XL LVL3 (GOWN DISPOSABLE) ×3
HANDPIECE INTERPULSE COAX TIP (DISPOSABLE) ×3
HEAD CERAMIC 36 PLUS5 (Hips) ×2 IMPLANT
HOOD PEEL AWAY FLYTE STAYCOOL (MISCELLANEOUS) ×8 IMPLANT
IV NS IRRIG 3000ML ARTHROMATIC (IV SOLUTION) ×3 IMPLANT
KIT BASIN OR (CUSTOM PROCEDURE TRAY) ×3 IMPLANT
LINER NEUTRAL 54X36MM PLUS 4 (Hips) ×2 IMPLANT
MARKER SKIN DUAL TIP RULER LAB (MISCELLANEOUS) ×3 IMPLANT
NDL SPNL 18GX3.5 QUINCKE PK (NEEDLE) ×1 IMPLANT
NEEDLE SPNL 18GX3.5 QUINCKE PK (NEEDLE) ×3 IMPLANT
PACK TOTAL JOINT (CUSTOM PROCEDURE TRAY) ×3 IMPLANT
PACK UNIVERSAL I (CUSTOM PROCEDURE TRAY) ×3 IMPLANT
RETRACTOR WND ALEXIS 18 MED (MISCELLANEOUS) IMPLANT
RTRCTR WOUND ALEXIS 18CM MED (MISCELLANEOUS) ×3
SAW OSC TIP CART 19.5X105X1.3 (SAW) ×3 IMPLANT
SET HNDPC FAN SPRY TIP SCT (DISPOSABLE) ×1 IMPLANT
STAPLER VISISTAT 35W (STAPLE) IMPLANT
STEM FEMORAL SZ6 HIGH ACTIS (Stem) ×2 IMPLANT
SUT ETHIBOND 2 V 37 (SUTURE) ×3 IMPLANT
SUT MNCRL AB 4-0 PS2 18 (SUTURE) ×2 IMPLANT
SUT VIC AB 0 CT1 27 (SUTURE) ×3
SUT VIC AB 0 CT1 27XBRD ANBCTR (SUTURE) ×1 IMPLANT
SUT VIC AB 1 CTX 36 (SUTURE) ×3
SUT VIC AB 1 CTX36XBRD ANBCTR (SUTURE) ×1 IMPLANT
SUT VIC AB 2-0 CT1 27 (SUTURE) ×9
SUT VIC AB 2-0 CT1 TAPERPNT 27 (SUTURE) ×2 IMPLANT
SYR 50ML LL SCALE MARK (SYRINGE) ×3 IMPLANT
TOWEL GREEN STERILE (TOWEL DISPOSABLE) ×3 IMPLANT
TRAY CATH 16FR W/PLASTIC CATH (SET/KITS/TRAYS/PACK) ×2 IMPLANT
YANKAUER SUCT BULB TIP NO VENT (SUCTIONS) ×3 IMPLANT

## 2019-03-09 NOTE — Anesthesia Procedure Notes (Signed)
Procedure Name: MAC Date/Time: 03/09/2019 2:07 PM Performed by: Orlie Dakin, CRNA Pre-anesthesia Checklist: Patient identified, Emergency Drugs available, Suction available, Patient being monitored and Timeout performed Patient Re-evaluated:Patient Re-evaluated prior to induction Oxygen Delivery Method: Nasal cannula Preoxygenation: Pre-oxygenation with 100% oxygen Induction Type: IV induction

## 2019-03-09 NOTE — H&P (Signed)
PREOPERATIVE H&P  Chief Complaint: left hip degenerative joint disease  HPI: Charles Riggs. is a 63 y.o. male who presents for surgical treatment of left hip degenerative joint disease.  He denies any changes in medical history.  Past Medical History:  Diagnosis Date  . Acute on chronic diastolic heart failure ([place])   . Anemia in chronic kidney disease   . Anxiety   . Aortic insufficiency   . Arthritis   . Asthma   . CHF (congestive heart failure) ([place])   . CKD (chronic kidney disease)   . Dyspnea   . Essential hypertension   . Nerve pain   . Non-rheumatic aortic regurgitation   . Obstructive sleep apnea    cpap  . Quadricuspid aortic valve   . S/P minimally invasive aortic valve replacement with bioprosthetic valve 12/16/2018   25 mm Edwards Inspiris Resilia stented bovine pericardial tissue valve via right mini thoracotomy approach  . Severe aortic insufficiency   . SOB (shortness of breath)   . Syncope 12/2018   Past Surgical History:  Procedure Laterality Date  . AORTIC VALVE REPLACEMENT N/A 12/16/2018   Procedure: MINIMALLY INVASIVE AORTIC VALVE REPLACEMENT (AVR) using Inspiris Aortic valve 34mm.;  Surgeon: Rexene Alberts, MD;  Location: [facility];  Service: Open Heart Surgery;  Laterality: N/A;  . HERNIA REPAIR    . IR THORACENTESIS ASP PLEURAL SPACE W/IMG GUIDE  12/29/2018  . RIB PLATING  12/16/2018   Procedure: Rib Plating;  Surgeon: Rexene Alberts, MD;  Location: [facility] OR;  Service: Open Heart Surgery;;  . RIGHT/LEFT HEART CATH AND CORONARY ANGIOGRAPHY N/A 10/14/2018   Procedure: RIGHT/LEFT HEART CATH AND CORONARY ANGIOGRAPHY;  Surgeon: Nigel Mormon, MD;  Location: [facility];  Service: Cardiovascular;  Laterality: N/A;  . TEE WITHOUT CARDIOVERSION N/A 11/11/2018   Procedure: TRANSESOPHAGEAL ECHOCARDIOGRAM (TEE);  Surgeon: Nigel Mormon, MD;  Location: [facility] ENDOSCOPY;  Service: Cardiovascular;  Laterality: N/A;  . TEE WITHOUT CARDIOVERSION N/A  12/16/2018   Procedure: TRANSESOPHAGEAL ECHOCARDIOGRAM (TEE);  Surgeon: Rexene Alberts, MD;  Location: [facility];  Service: Open Heart Surgery;  Laterality: N/A;   Social History   Socioeconomic History  . Marital status: Married    Spouse name: Not on file  . Number of children: 4  . Years of education: Not on file  . Highest education level: Not on file  Occupational History  . Not on file  Social Needs  . Financial resource strain: Not on file  . Food insecurity    Worry: Not on file    Inability: Not on file  . Transportation needs    Medical: Not on file    Non-medical: Not on file  Tobacco Use  . Smoking status: Former Smoker    Types: Cigarettes  . Smokeless tobacco: Never Used  . Tobacco comment: 30 PY  Substance and Sexual Activity  . Alcohol use: No  . Drug use: No  . Sexual activity: Not on file  Lifestyle  . Physical activity    Days per week: Not on file    Minutes per session: Not on file  . Stress: Not on file  Relationships  . Social Herbalist on phone: Not on file    Gets together: Not on file    Attends religious service: Not on file    Active member of club or organization: Not on file    Attends meetings of clubs or organizations: Not on  file    Relationship status: Not on file  Other Topics Concern  . Not on file  Social History Narrative  . Not on file   Family History  Problem Relation Age of Onset  . COPD Father   . Cancer Brother    No Known Allergies Prior to Admission medications   Medication Sig Start Date End Date Taking? Authorizing Provider  albuterol (PROVENTIL HFA;VENTOLIN HFA) 108 (90 Base) MCG/ACT inhaler Inhale 2 puffs into the lungs every 6 (six) hours as needed for wheezing or shortness of breath.   Yes [provider]  amLODipine (NORVASC) 10 MG tablet Take 10 mg by mouth daily.   Yes [provider]  aspirin EC 81 MG tablet Take 81 mg by mouth daily.   Yes [provider]   budesonide-formoterol (SYMBICORT) 160-4.5 MCG/ACT inhaler Inhale 2 puffs into the lungs 2 (two) times daily. 12/29/18  Yes Rexene Alberts, MD  Cholecalciferol (VITAMIN D3) 50 MCG (2000 UT) TABS Take 2,000 Units by mouth 2 (two) times daily.   Yes [provider]  COMBIVENT RESPIMAT 20-100 MCG/ACT AERS respimat Inhale 2 puffs into the lungs daily as needed for shortness of breath. 12/10/16  Yes Law, Bea Graff, PA-C  dextromethorphan-guaiFENesin (MUCINEX DM) 30-600 MG 12hr tablet Take 1 tablet by mouth 2 (two) times daily.   Yes [provider]  furosemide (LASIX) 40 MG tablet Take 1 tablet (40 mg total) by mouth daily for 5 days. 12/31/18 03/01/20 Yes Barrett, Erin R, PA-C  gabapentin (NEURONTIN) 400 MG capsule Take 400 mg by mouth 3 (three) times daily.    Yes [provider]  hydrOXYzine (VISTARIL) 25 MG capsule Take 25 mg by mouth 3 (three) times daily.   Yes [provider]  losartan (COZAAR) 50 MG tablet Take 25 mg by mouth daily.   Yes [provider]  meloxicam (MOBIC) 15 MG tablet Take 15 mg by mouth daily as needed for pain.   Yes [provider]  metoprolol tartrate (LOPRESSOR) 25 MG tablet Take 25 mg by mouth 2 (two) times daily.   Yes [provider]  montelukast (SINGULAIR) 10 MG tablet Take 10 mg by mouth daily.    Yes [provider]  mupirocin nasal ointment (BACTROBAN) 2 % Place 1 application into the nose 2 (two) times daily. Use one-half of tube in each nostril twice daily for five (5) days. After application, press sides of nose together and gently massage. 03/06/19  Yes Aundra Dubin, PA-C  omeprazole (PRILOSEC) 20 MG capsule Take 20 mg by mouth 2 (two) times daily.   Yes [provider]  sertraline (ZOLOFT) 50 MG tablet Take 50 mg by mouth daily.   Yes [provider]  albuterol (PROVENTIL) (2.5 MG/3ML) 0.083% nebulizer solution Take 3 mLs (2.5 mg total) by nebulization every 6 (six) hours as  needed for wheezing or shortness of breath. 11/14/17   Noemi Chapel, MD  fluticasone [facility]) 50 MCG/ACT nasal spray Place 2 sprays into both nostrils daily as needed for allergies or rhinitis.    [provider]  losartan (COZAAR) 25 MG tablet Take 1 tablet (25 mg total) by mouth daily. Patient not taking: Reported on 03/02/2019 01/07/19   Dessa Phi, DO  metoprolol tartrate (LOPRESSOR) 50 MG tablet Take 1 tablet (50 mg total) by mouth 2 (two) times daily. 01/07/19   Dessa Phi, DO  omeprazole (PRILOSEC) 40 MG capsule TAKE ONE CAPSULE BY MOUTH DAILY Patient not taking: No sig  reported 12/01/18   [name], Reynold Bowen, MD  rosuvastatin (CRESTOR) 10 MG tablet TAKE 1 TABLET BY MOUTH DAILY AT 6PM Patient not taking: Reported on 03/02/2019 02/03/19   Nigel Mormon, MD     Positive ROS: All other systems have been reviewed and were otherwise negative with the exception of those mentioned in the HPI and as above.  Physical Exam: General: Alert, no acute distress Cardiovascular: No pedal edema Respiratory: No cyanosis, no use of accessory musculature GI: abdomen soft Skin: No lesions in the area of chief complaint Neurologic: Sensation intact distally Psychiatric: Patient is competent for consent with normal mood and affect Lymphatic: no lymphedema  MUSCULOSKELETAL: exam stable  Assessment: left hip degenerative joint disease  Plan: Plan for Procedure(s): LEFT TOTAL HIP ARTHROPLASTY ANTERIOR APPROACH  The risks benefits and alternatives were discussed with the patient including but not limited to the risks of nonoperative treatment, versus surgical intervention including infection, bleeding, nerve injury,  blood clots, cardiopulmonary complications, morbidity, mortality, among others, and they were willing to proceed.   Preoperative templating of the joint replacement has been completed, documented, and submitted to the Operating Room personnel in order to optimize  intra-operative equipment management.  Eduard Roux, MD   03/09/2019 1:25 PM

## 2019-03-09 NOTE — Op Note (Signed)
LEFT TOTAL HIP ARTHROPLASTY ANTERIOR APPROACH  Procedure Note Javonne Borke   [id number]  Pre-op Diagnosis: left hip degenerative joint disease     Post-op Diagnosis: same   Operative Procedures  1. Total hip replacement; Left hip; uncemented [id number]   Personnel  Surgeon(s): Leandrew Koyanagi, MD  Assist: Madalyn Rob, PA-C; necessary for the timely completion of procedure and due to complexity of procedure.   Anesthesia: spinal  Prosthesis: Depuy Acetabulum: Pinnacle 54 mm Femur: Actis 6 HO Head: 36 mm size: +5 Liner: +4 neutral Bearing Type: ceramic on poly  Total Hip Arthroplasty (Anterior Approach) Op Note:  After informed consent was obtained and the operative extremity marked in the holding area, the patient was brought back to the operating room and placed supine on the HANA table. Next, the operative extremity was prepped and draped in normal sterile fashion. Surgical timeout occurred verifying patient identification, surgical site, surgical procedure and administration of antibiotics.  A modified anterior Smith-Peterson approach to the hip was performed, using the interval between tensor fascia lata and sartorius.  Dissection was carried bluntly down onto the anterior hip capsule. The lateral femoral circumflex vessels were identified and coagulated. A capsulotomy was performed and the capsular flaps tagged for later repair.  Fluoroscopy was utilized to prepare for the femoral neck cut. The neck osteotomy was performed. The femoral head was removed, the acetabular rim was cleared of soft tissue and attention was turned to reaming the acetabulum.  Sequential reaming was performed under fluoroscopic guidance. We reamed to a size 53 mm, and then impacted the acetabular shell. The liner was then placed after irrigation and attention turned to the femur.  After placing the femoral hook, the leg was taken to externally rotated, extended and adducted position taking care  to perform soft tissue releases to allow for adequate mobilization of the femur. Soft tissue was cleared from the shoulder of the greater trochanter and the hook elevator used to improve exposure of the proximal femur. Sequential broaching performed up to a size 6. Trial neck and head were placed. The leg was brought back up to neutral and the construct reduced. The position and sizing of components, offset and leg lengths were checked using fluoroscopy. Stability of the construct was checked in extension and external rotation without any subluxation or impingement of prosthesis. We dislocated the prosthesis, dropped the leg back into position, removed trial components, and irrigated copiously. The final stem and head was then placed, the leg brought back up, the system reduced and fluoroscopy used to verify positioning.  We irrigated, obtained hemostasis and closed the capsule using #2 ethibond suture.  One gram of vancomycin powder was placed in the surgical bed. The fascia was closed with #1 vicryl plus, the deep fat layer was closed with 0 vicryl, the subcutaneous layers closed with 2.0 Vicryl Plus and the skin closed with 4.0 monocryl and steri strips. A sterile dressing was applied. The patient was awakened in the operating room and taken to recovery in stable condition.  All sponge, needle, and instrument counts were correct at the end of the case.   Position: supine  Complications: see description of procedure.  Time Out: performed   Drains/Packing: none  Estimated blood loss: see anesthesia record  Returned to Recovery Room: in good condition.   Antibiotics: yes   Mechanical VTE (DVT) Prophylaxis: sequential compression devices, TED thigh-high  Chemical VTE (DVT) Prophylaxis: aspirin   Fluid Replacement: see anesthesia record  Specimens Removed:  1 to pathology   Sponge and Instrument Count Correct? yes   PACU: portable radiograph - low AP   Plan/RTC: Return in 2 weeks for staple  removal. Weight Bearing/Load Lower Extremity: full  Hip precautions: none Suture Removal: 2 weeks   N. Eduard Roux, MD Marga Hoots 3:12 PM   Implant Name Type Inv. Item Serial No. Manufacturer Lot No. LRB No. Used Action  ACETABULAR CUP Daisy Blossom 54MM - [id number] Plate ACETABULAR CUP W GRIPTION 54MM  DEPUY SYNTHES [id number] Left 1 Implanted  LINER NEUTRAL 54X36MM PLUS 4 - [id number] Hips LINER NEUTRAL 54X36MM PLUS 4  DEPUY SYNTHES [id number] Left 1 Implanted  STEM FEMORAL SZ6 HIGH ACTIS - [id number] Stem STEM FEMORAL SZ6 HIGH ACTIS  [employer] DEPUY SPINE [id number] Left 1 Implanted  HEAD CERAMIC 36 PLUS5 - [id number] Hips HEAD CERAMIC 36 PLUS5  DEPUY SYNTHES [id number] Left 1 Implanted

## 2019-03-09 NOTE — Plan of Care (Signed)

## 2019-03-09 NOTE — Discharge Instructions (Signed)

## 2019-03-09 NOTE — Transfer of Care (Signed)
Immediate Anesthesia Transfer of Care Note  Patient: Charles Riggs.  Procedure(s) Performed: LEFT TOTAL HIP ARTHROPLASTY ANTERIOR APPROACH (Left Hip)  Patient Location: PACU  Anesthesia Type:Spinal  Level of Consciousness: drowsy  Airway & Oxygen Therapy: Patient Spontanous Breathing  Post-op Assessment: Report given to RN and Post -op Vital signs reviewed and stable  Post vital signs: Reviewed and stable  Last Vitals:  Vitals Value Taken Time  BP 82/63 03/09/19 1607  Temp 36.2 C 03/09/19 1545  Pulse 67 03/09/19 1608  Resp 18 03/09/19 1608  SpO2 99 % 03/09/19 1608  Vitals shown include unvalidated device data.  Last Pain:  Vitals:   03/09/19 1545  PainSc: Asleep      Patients Stated Pain Goal: 5 ([id number])  Complications: No apparent anesthesia complications

## 2019-03-10 ENCOUNTER — Encounter (HOSPITAL_COMMUNITY): Payer: Self-pay | Admitting: Orthopaedic Surgery

## 2019-03-10 DIAGNOSIS — M1612 Unilateral primary osteoarthritis, left hip: Secondary | ICD-10-CM | POA: Diagnosis not present

## 2019-03-10 LAB — CBC
HCT: 30.1 % — ABNORMAL LOW (ref 39.0–52.0)
Hemoglobin: 9.1 g/dL — ABNORMAL LOW (ref 13.0–17.0)
MCH: 24.3 pg — ABNORMAL LOW (ref 26.0–34.0)
MCHC: 30.2 g/dL (ref 30.0–36.0)
MCV: 80.3 fL (ref 80.0–100.0)
Platelets: 226 10*3/uL (ref 150–400)
RBC: 3.75 MIL/uL — ABNORMAL LOW (ref 4.22–5.81)
RDW: 18.3 % — ABNORMAL HIGH (ref 11.5–15.5)
WBC: 9.3 10*3/uL (ref 4.0–10.5)
nRBC: 0 % (ref 0.0–0.2)

## 2019-03-10 LAB — BASIC METABOLIC PANEL
Anion gap: 11 (ref 5–15)
BUN: 15 mg/dL (ref 8–23)
CO2: 26 mmol/L (ref 22–32)
Calcium: 8.7 mg/dL — ABNORMAL LOW (ref 8.9–10.3)
Chloride: 100 mmol/L (ref 98–111)
Creatinine, Ser: 1.47 mg/dL — ABNORMAL HIGH (ref 0.61–1.24)
GFR calc Af Amer: 59 mL/min — ABNORMAL LOW (ref >60)
GFR calc non Af Amer: 51 mL/min — ABNORMAL LOW (ref >60)
Glucose, Bld: 155 mg/dL — ABNORMAL HIGH (ref 70–99)
Potassium: 4.9 mmol/L (ref 3.5–5.1)
Sodium: 137 mmol/L (ref 135–145)

## 2019-03-10 LAB — POCT I-STAT 4, (NA,K, GLUC, HGB,HCT)
Glucose, Bld: 95 mg/dL (ref 70–99)
HCT: 29 % — ABNORMAL LOW (ref 39.0–52.0)
Hemoglobin: 9.9 g/dL — ABNORMAL LOW (ref 13.0–17.0)
Potassium: 4.5 mmol/L (ref 3.5–5.1)
Sodium: 140 mmol/L (ref 135–145)

## 2019-03-10 MED ORDER — OXYCODONE-ACETAMINOPHEN 5-325 MG PO TABS: 1.0000 | ORAL_TABLET | Freq: Four times a day (QID) | ORAL | 0 refills | Status: DC | PRN

## 2019-03-10 MED ORDER — ONDANSETRON HCL 4 MG PO TABS: 4.0000 mg | ORAL_TABLET | Freq: Three times a day (TID) | ORAL | 0 refills | Status: DC | PRN

## 2019-03-10 MED ORDER — METHOCARBAMOL 500 MG PO TABS: 500.0000 mg | ORAL_TABLET | Freq: Two times a day (BID) | ORAL | 0 refills | Status: DC | PRN

## 2019-03-10 MED ORDER — BUPIVACAINE IN DEXTROSE 0.75-8.25 % IT SOLN
INTRATHECAL | Status: DC | PRN
  Administered 2019-03-09: 1.6 mg via INTRATHECAL

## 2019-03-10 MED ORDER — DULCOLAX 5 MG PO TBEC: 5.0000 mg | DELAYED_RELEASE_TABLET | Freq: Every day | ORAL | 1 refills | Status: AC | PRN

## 2019-03-10 MED ORDER — ASPIRIN EC 81 MG PO TBEC: 81.0000 mg | DELAYED_RELEASE_TABLET | Freq: Every day | ORAL | 0 refills | Status: DC

## 2019-03-10 NOTE — Anesthesia Postprocedure Evaluation (Signed)
Anesthesia Post Note  Patient: Charles Riggs.  Procedure(s) Performed: LEFT TOTAL HIP ARTHROPLASTY ANTERIOR APPROACH (Left Hip)     Patient location during evaluation: PACU Anesthesia Type: Spinal Level of consciousness: oriented and awake and alert Pain management: pain level controlled Vital Signs Assessment: post-procedure vital signs reviewed and stable Respiratory status: spontaneous breathing, respiratory function stable and patient connected to nasal cannula oxygen Cardiovascular status: blood pressure returned to baseline and stable Postop Assessment: no headache, no backache and no apparent nausea or vomiting Anesthetic complications: no    Last Vitals:  Vitals:   03/09/19 1934 03/09/19 2340  BP: 119/65 105/63  Pulse: 69 71  Resp: 17 17  Temp: 36.7 C 36.6 C  SpO2: 97% 98%    Last Pain:  Vitals:   03/10/19 0345  TempSrc:   PainSc: Asleep                 Catalina Gravel

## 2019-03-10 NOTE — Evaluation (Addendum)
Physical Therapy Evaluation Patient Details Name: Charles Riggs. MRN: [id number] DOB: 05/08/1956 Today's Date: 03/10/2019   History of Present Illness  Pt admit for left direct anterior hip replacement.  WBAT left LE.  PMH:  Diastolic heart failure, syncope, AVR, asthma  Clinical Impression  Pt admitted with above diagnosis. Pt was able to ambulate with RW with supervision on unit.  Ascended and descended steps with supervision.  Will progress well and be able to go home tomorrow.  Performed all exercises as well.   Pt currently with functional limitations due to the deficits listed below (see PT Problem List). Pt will benefit from skilled PT to increase their independence and safety with mobility to allow discharge to the venue listed below.      Follow Up Recommendations No PT follow up;Supervision - Intermittent    Equipment Recommendations  None recommended by PT    Recommendations for Other Services       Precautions / Restrictions Precautions Precautions: None;Anterior Hip Precaution Booklet Issued: Yes (comment) Precaution Comments: pt has no hip precautions Restrictions Weight Bearing Restrictions: Yes LLE Weight Bearing: Weight bearing as tolerated      Mobility  Bed Mobility Overal bed mobility: Independent                Transfers Overall transfer level: Needs assistance Equipment used: Rolling walker (2 wheeled) Transfers: Sit to/from Stand Sit to Stand: Supervision         General transfer comment: cues for hand placement   Ambulation/Gait Ambulation/Gait assistance: Supervision Gait Distance (Feet): 550 Feet Assistive device: Rolling walker (2 wheeled) Gait Pattern/deviations: Step-through pattern;Decreased stride length   Gait velocity interpretation: 1.31 - 2.62 ft/sec, indicative of limited community ambulator General Gait Details: Pt did well with ambulation with RW and didnot need cues.  Safe with RW and plans to use initiaily at  home.   Stairs Stairs: Yes Stairs assistance: Supervision Stair Management: One rail Left;Forwards;Step to pattern Number of Stairs: 3 General stair comments: Pt was able to ascend and descend steps with rail without problems.  Also able to carry RW with him if he needs to do this at home.   Wheelchair Mobility    Modified Rankin (Stroke Patients Only)       Balance Overall balance assessment: Needs assistance Sitting-balance support: No upper extremity supported;Feet supported Sitting balance-Leahy Scale: Good     Standing balance support: During functional activity;No upper extremity supported Standing balance-Leahy Scale: Fair Standing balance comment: can stand statically without UE support                             Pertinent Vitals/Pain Pain Assessment: 0-10 Pain Score: 5  Pain Location: left surgical pain Pain Descriptors / Indicators: Aching;Burning Pain Intervention(s): Limited activity within patient's tolerance;Monitored during session;Premedicated before session;Repositioned    Home Living Family/patient expects to be discharged to:: Private residence Living Arrangements: Spouse/significant other Available Help at Discharge: Family;Available 24 hours/day Type of Home: House Home Access: Stairs to enter Entrance Stairs-Rails: Left Entrance Stairs-Number of Steps: 3 Home Layout: Two level;Able to live on main level with bedroom/bathroom;Bed/bath upstairs;Full bath on main level Home Equipment: Shower seat - built in;Hand held Tourist information centre manager - 2 wheels(electric bed, rollator)      Prior Function Level of Independence: Independent         Comments: used knee scooter at times when going out per pt due to right hip pain  Hand Dominance   Dominant Hand: Left    Extremity/Trunk Assessment   Upper Extremity Assessment Upper Extremity Assessment: Defer to OT evaluation    Lower Extremity Assessment Lower Extremity Assessment:  Generalized weakness    Cervical / Trunk Assessment Cervical / Trunk Assessment: Normal  Communication   Communication: No difficulties  Cognition Arousal/Alertness: Awake/alert Behavior During Therapy: WFL for tasks assessed/performed Overall Cognitive Status: Within Functional Limits for tasks assessed                                        General Comments      Exercises Total Joint Exercises Ankle Circles/Pumps: AROM;Both;10 reps;Supine Quad Sets: AROM;Both;10 reps;Supine Short Arc Quad: AROM;10 reps;Supine;Left Heel Slides: AROM;10 reps;Supine;Left Hip ABduction/ADduction: AROM;Left;10 reps;Supine;Standing Long Arc Quad: AROM;Left;10 reps;Seated Knee Flexion: AROM;Left;10 reps;Standing Marching in Standing: AROM;Left;10 reps;Standing Standing Hip Extension: AROM;Left;10 reps;Standing   Assessment/Plan    PT Assessment Patient needs continued PT services  PT Problem List Decreased mobility;Decreased balance;Decreased activity tolerance;Decreased strength;Decreased range of motion;Decreased knowledge of use of DME;Decreased safety awareness;Decreased knowledge of precautions;Pain       PT Treatment Interventions DME instruction;Gait training;Stair training;Functional mobility training;Therapeutic activities;Therapeutic exercise;Balance training;Patient/family education    PT Goals (Current goals can be found in the Care Plan section)  Acute Rehab PT Goals Patient Stated Goal: to go home PT Goal Formulation: With patient Time For Goal Achievement: 03/17/19 Potential to Achieve Goals: Good    Frequency 7X/week   Barriers to discharge        Co-evaluation               AM-PAC PT "6 Clicks" Mobility  Outcome Measure Help needed turning from your back to your side while in a flat bed without using bedrails?: None Help needed moving from lying on your back to sitting on the side of a flat bed without using bedrails?: None Help needed moving  to and from a bed to a chair (including a wheelchair)?: None Help needed standing up from a chair using your arms (e.g., wheelchair or bedside chair)?: A Little Help needed to walk in hospital room?: A Little Help needed climbing 3-5 steps with a railing? : A Little 6 Click Score: 21    End of Session Equipment Utilized During Treatment: Gait belt Activity Tolerance: Patient tolerated treatment well Patient left: in chair;with call bell/phone within reach Nurse Communication: Mobility status PT Visit Diagnosis: Muscle weakness (generalized) (M62.81);Pain Pain - Right/Left: Left Pain - part of body: Leg    Time: [id number] PT Time Calculation (min) (ACUTE ONLY): 44 min   Charges:   PT Evaluation $PT Eval Moderate Complexity: 1 Mod PT Treatments $Gait Training: 8-22 mins $Therapeutic Exercise: 8-22 mins        [place] Pager:  [phone]  Office:  [place] 03/10/2019, 9:52 AM

## 2019-03-10 NOTE — Progress Notes (Signed)
03/10/19 1200  PT Visit Information  Last PT Received On 03/10/19  Assistance Needed +1  History of Present Illness Pt admit for left direct anterior hip replacement.  WBAT left LE.  PMH:  Diastolic heart failure, syncope, AVR, asthma  Subjective Data  Patient Stated Goal to go home  Precautions  Precautions None;Anterior Hip  Precaution Booklet Issued Yes (comment)  Precaution Comments pt has no hip precautions  Restrictions  Weight Bearing Restrictions Yes  LLE Weight Bearing WBAT  Pain Assessment  Pain Assessment 0-10  Pain Score 3  Pain Location left surgical pain  Pain Descriptors / Indicators Aching;Burning  Pain Intervention(s) Limited activity within patient's tolerance;Monitored during session;Repositioned  Cognition  Arousal/Alertness Awake/alert  Behavior During Therapy WFL for tasks assessed/performed  Overall Cognitive Status Within Functional Limits for tasks assessed  Bed Mobility  Overal bed mobility Independent  Transfers  Overall transfer level Needs assistance  Equipment used Rolling walker (2 wheeled)  Transfers Sit to/from Stand  Sit to Stand Supervision  General transfer comment cues for hand placement   Ambulation/Gait  Ambulation/Gait assistance Supervision  Assistive device Rolling walker (2 wheeled)  Gait Pattern/deviations Step-through pattern;Decreased stride length  General Gait Details Pt did well with ambulation with RW and didnot need cues.  Safe with RW and plans to use initiaily at home.   Gait velocity interpretation 1.31 - 2.62 ft/sec, indicative of limited community ambulator  Balance  Overall balance assessment Needs assistance  Sitting-balance support No upper extremity supported;Feet supported  Sitting balance-Leahy Scale Good  Standing balance support During functional activity;No upper extremity supported  Standing balance-Leahy Scale Fair  Standing balance comment can stand statically without UE support  Exercises  Exercises  Total Joint  Total Joint Exercises  Hip ABduction/ADduction AROM;Left;10 reps;Standing  Long Arc Quad AROM;Left;10 reps;Seated  Knee Flexion AROM;Left;10 reps;Standing  Marching in Standing AROM;Left;10 reps;Standing  Standing Hip Extension AROM;Left;10 reps;Standing  PT - End of Session  Equipment Utilized During Treatment Gait belt  Activity Tolerance Patient tolerated treatment well  Patient left in chair;with call bell/phone within reach  Nurse Communication Mobility status   PT - Assessment/Plan  PT Plan Current plan remains appropriate  PT Visit Diagnosis Muscle weakness (generalized) (M62.81);Pain  Pain - Right/Left Left  Pain - part of body Leg  PT Frequency (ACUTE ONLY) 7X/week  Follow Up Recommendations No PT follow up;Supervision - Intermittent  PT equipment None recommended by PT  AM-PAC PT "6 Clicks" Mobility Outcome Measure (Version 2)  Help needed turning from your back to your side while in a flat bed without using bedrails? 4  Help needed moving from lying on your back to sitting on the side of a flat bed without using bedrails? 4  Help needed moving to and from a bed to a chair (including a wheelchair)? 4  Help needed standing up from a chair using your arms (e.g., wheelchair or bedside chair)? 3  Help needed to walk in hospital room? 3  Help needed climbing 3-5 steps with a railing?  3  6 Click Score 21  Consider Recommendation of Discharge To: Home with no services  PT Goal Progression  Progress towards PT goals Progressing toward goals  Acute Rehab PT Goals  Potential to Achieve Goals Good  PT Time Calculation  PT Start Time (ACUTE ONLY) 1149  PT Stop Time (ACUTE ONLY) 1159  PT Time Calculation (min) (ACUTE ONLY) 10 min  PT General Charges  $$ ACUTE PT VISIT 1 Visit  PT Treatments  $  Gait Training 8-22 mins  Pt continues to progress.  Doing very well and is ready for d/c in am from mobility standpoint.  Thanks. [place] Pager:  [phone]  Office:  [phone]

## 2019-03-10 NOTE — Progress Notes (Addendum)
4pm-[facility] for the DME scooter if patient requires it. CSW notes patient is ambulating with supervision and a walker. RN aware.   10am-Patient's MD is at the [facility].  His CSW is Kellie Simmering [phone]; office phone [phone] ext [phone]. CSW left her a voicemail regarding patient request for a scooter once he discharges home. Patient denied other needs.   Percell Locus Anneliese Leblond LCSW [phone]

## 2019-03-10 NOTE — Anesthesia Procedure Notes (Signed)
Spinal  Patient location during procedure: OR Start time: 03/09/2019 1:30 PM End time: 03/09/2019 1:35 PM Staffing Anesthesiologist: Catalina Gravel, MD Performed: anesthesiologist  Preanesthetic Checklist Completed: patient identified, surgical consent, pre-op evaluation, timeout performed, IV checked, risks and benefits discussed and monitors and equipment checked Spinal Block Patient position: sitting Prep: site prepped and draped and DuraPrep Patient monitoring: continuous pulse ox and blood pressure Approach: midline Location: L3-4 Injection technique: single-shot Needle Needle type: Pencan  Needle gauge: 24 G Additional Notes Functioning IV was confirmed and monitors were applied. Sterile prep and drape, including hand hygiene, mask and sterile gloves were used. The patient was positioned and the spine was prepped. The skin was anesthetized with lidocaine.  Free flow of clear CSF was obtained prior to injecting local anesthetic into the CSF.  The spinal needle aspirated freely following injection.  The needle was carefully withdrawn.  The patient tolerated the procedure well. Consent was obtained prior to procedure with all questions answered and concerns addressed. Risks including but not limited to bleeding, infection, nerve damage, paralysis, failed block, inadequate analgesia, allergic reaction, high spinal, itching and headache were discussed and the patient wished to proceed.   Hoy Morn, MD

## 2019-03-10 NOTE — Progress Notes (Signed)
Subjective: 1 Day Post-Op Procedure(s) (LRB): LEFT TOTAL HIP ARTHROPLASTY ANTERIOR APPROACH (Left) Patient reports pain as mild.    Objective: Vital signs in last 24 hours: Temp:  [97.2 F (36.2 C)-98.4 F (36.9 C)] 97.9 F (36.6 C) (10/12 2340) Pulse Rate:  [60-76] 71 (10/12 2340) Resp:  [12-29] 17 (10/12 2340) BP: (72-124)/(49-80) 105/63 (10/12 2340) SpO2:  [95 %-100 %] 98 % (10/12 2340) Weight:  [104.3 kg] 104.3 kg (10/12 1116)  Intake/Output from previous day: 10/12 0701 - 10/13 0700 In: 2451.9 [P.O.:440; I.V.:1712.9; IV Piggyback:299] Out: 1300 [Urine:1100; Blood:200] Intake/Output this shift: No intake/output data recorded.  Recent Labs    03/09/19 1619 03/10/19 0152  HGB 9.9* 9.1*   Recent Labs    03/09/19 1619 03/10/19 0152  WBC  --  9.3  RBC  --  3.75*  HCT 29.0* 30.1*  PLT  --  226   Recent Labs    03/09/19 1619 03/10/19 0152  NA 140 137  K 4.5 4.9  CL  --  100  CO2  --  26  BUN  --  15  CREATININE  --  1.47*  GLUCOSE 95 155*  CALCIUM  --  8.7*   No results for input(s): LABPT, INR in the last 72 hours.  Neurologically intact Neurovascular intact Sensation intact distally Intact pulses distally Dorsiflexion/Plantar flexion intact Incision: dressing C/D/I No cellulitis present Compartment soft   Assessment/Plan: 1 Day Post-Op Procedure(s) (LRB): LEFT TOTAL HIP ARTHROPLASTY ANTERIOR APPROACH (Left) Advance diet Up with therapy Plan for discharge tomorrow unless he does really well with PT today.  If so, call for d/c order WBAT LLE ABLA-mild and stable D/c foley      Aundra Dubin 03/10/2019, 7:17 AM

## 2019-03-11 DIAGNOSIS — M1612 Unilateral primary osteoarthritis, left hip: Secondary | ICD-10-CM | POA: Diagnosis not present

## 2019-03-11 NOTE — Progress Notes (Signed)
Discharge instructions addressed;pt in stable condition;not in respiratory distress; Pt to be picked up by wife at  the [employer] entrance.

## 2019-03-11 NOTE — Progress Notes (Signed)
Subjective: 2 Days Post-Op Procedure(s) (LRB): LEFT TOTAL HIP ARTHROPLASTY ANTERIOR APPROACH (Left) Patient reports pain as mild.    Objective: Vital signs in last 24 hours: Temp:  [97.8 F (36.6 C)-98.6 F (37 C)] 98.2 F (36.8 C) (10/14 0756) Pulse Rate:  [63-74] 63 (10/14 0756) Resp:  [16-18] 16 (10/14 0756) BP: (98-127)/(52-73) 127/73 (10/14 0756) SpO2:  [93 %-100 %] 100 % (10/14 0756)  Intake/Output from previous day: 10/13 0701 - 10/14 0700 In: 3597.4 [P.O.:1760; I.V.:1837.4] Out: 2280 [Urine:2280] Intake/Output this shift: Total I/O In: -  Out: 500 [Urine:500]  Recent Labs    03/09/19 1619 03/10/19 0152  HGB 9.9* 9.1*   Recent Labs    03/09/19 1619 03/10/19 0152  WBC  --  9.3  RBC  --  3.75*  HCT 29.0* 30.1*  PLT  --  226   Recent Labs    03/09/19 1619 03/10/19 0152  NA 140 137  K 4.5 4.9  CL  --  100  CO2  --  26  BUN  --  15  CREATININE  --  1.47*  GLUCOSE 95 155*  CALCIUM  --  8.7*   No results for input(s): LABPT, INR in the last 72 hours.  Neurologically intact Neurovascular intact Sensation intact distally Intact pulses distally Dorsiflexion/Plantar flexion intact Incision: scant drainage No cellulitis present Compartment soft   Assessment/Plan: 2 Days Post-Op Procedure(s) (LRB): LEFT TOTAL HIP ARTHROPLASTY ANTERIOR APPROACH (Left) Up with therapy Discharge home with home health after first or second PT session (up to patient and his progression with PT) WBAT LLE ABLA-mild and stable       Aundra Dubin 03/11/2019, 7:56 AM

## 2019-03-11 NOTE — Discharge Summary (Signed)
Patient ID: Charles Riggs. MRN: [id number] DOB/AGE: [birth year] 63 y.o.  Admit date: 03/09/2019 Discharge date: 03/11/2019  Admission Diagnoses:  Principal Problem:   Primary osteoarthritis of left hip Active Problems:   Status post total replacement of left hip   Discharge Diagnoses:  Same  Past Medical History:  Diagnosis Date  . Acute on chronic diastolic heart failure ([place])   . Anemia in chronic kidney disease   . Anxiety   . Aortic insufficiency   . Arthritis   . Asthma   . CHF (congestive heart failure) ([place])   . CKD (chronic kidney disease)   . Dyspnea   . Essential hypertension   . Nerve pain   . Non-rheumatic aortic regurgitation   . Obstructive sleep apnea    cpap  . Quadricuspid aortic valve   . S/P minimally invasive aortic valve replacement with bioprosthetic valve 12/16/2018   25 mm Edwards Inspiris Resilia stented bovine pericardial tissue valve via right mini thoracotomy approach  . Severe aortic insufficiency   . SOB (shortness of breath)   . Syncope 12/2018    Surgeries: Procedure(s): LEFT TOTAL HIP ARTHROPLASTY ANTERIOR APPROACH on 03/09/2019   Consultants:   Discharged Condition: Improved  Hospital Course: Teshon Chasse. is an 63 y.o. male who was admitted 03/09/2019 for operative treatment ofPrimary osteoarthritis of left hip. Patient has severe unremitting pain that affects sleep, daily activities, and work/hobbies. After pre-op clearance the patient was taken to the operating room on 03/09/2019 and underwent  Procedure(s): LEFT TOTAL HIP ARTHROPLASTY ANTERIOR APPROACH.    Patient was given perioperative antibiotics:  Anti-infectives (From admission, onward)   Start     Dose/Rate Route Frequency Ordered Stop   03/09/19 2000  ceFAZolin (ANCEF) IVPB 2g/100 mL premix     2 g 200 mL/hr over 30 Minutes Intravenous Every 6 hours 03/09/19 1840 03/10/19 0904   03/09/19 1413  vancomycin (VANCOCIN) powder  Status:  Discontinued       As  needed 03/09/19 1413 03/09/19 1537   03/09/19 1115  ceFAZolin (ANCEF) IVPB 2g/100 mL premix     2 g 200 mL/hr over 30 Minutes Intravenous On call to O.R. 03/09/19 1113 03/09/19 1352       Patient was given sequential compression devices, early ambulation, and chemoprophylaxis to prevent DVT.  Patient benefited maximally from hospital stay and there were no complications.    Recent vital signs:  Patient Vitals for the past 24 hrs:  BP Temp Temp src Pulse Resp SpO2  03/11/19 0756 127/73 98.2 F (36.8 C) Oral 63 16 100 %  03/11/19 0421 118/73 98.3 F (36.8 C) Oral 68 17 100 %  03/10/19 1929 111/60 97.8 F (36.6 C) Oral 74 18 99 %  03/10/19 1534 98/60 98.6 F (37 C) Oral 71 16 97 %     Recent laboratory studies:  Recent Labs    03/09/19 1619 03/10/19 0152  WBC  --  9.3  HGB 9.9* 9.1*  HCT 29.0* 30.1*  PLT  --  226  NA 140 137  K 4.5 4.9  CL  --  100  CO2  --  26  BUN  --  15  CREATININE  --  1.47*  GLUCOSE 95 155*  CALCIUM  --  8.7*     Discharge Medications:   Allergies as of 03/11/2019   No Known Allergies     Medication List    STOP taking these medications   meloxicam 15 MG tablet Commonly known  as: MOBIC   mupirocin nasal ointment 2 % Commonly known as: BACTROBAN   rosuvastatin 10 MG tablet Commonly known as: CRESTOR     TAKE these medications   albuterol 108 (90 Base) MCG/ACT inhaler Commonly known as: VENTOLIN HFA Inhale 2 puffs into the lungs every 6 (six) hours as needed for wheezing or shortness of breath.   albuterol (2.5 MG/3ML) 0.083% nebulizer solution Commonly known as: PROVENTIL Take 3 mLs (2.5 mg total) by nebulization every 6 (six) hours as needed for wheezing or shortness of breath.   amLODipine 10 MG tablet Commonly known as: NORVASC Take 10 mg by mouth daily.   aspirin EC 81 MG tablet Take 1 tablet (81 mg total) by mouth daily.   budesonide-formoterol 160-4.5 MCG/ACT inhaler Commonly known as: SYMBICORT Inhale 2 puffs  into the lungs 2 (two) times daily.   Combivent Respimat 20-100 MCG/ACT Aers respimat Generic drug: Ipratropium-Albuterol Inhale 2 puffs into the lungs daily as needed for shortness of breath.   dextromethorphan-guaiFENesin 30-600 MG 12hr tablet Commonly known as: MUCINEX DM Take 1 tablet by mouth 2 (two) times daily.   Dulcolax 5 MG EC tablet Generic drug: bisacodyl Take 1 tablet (5 mg total) by mouth daily as needed for moderate constipation.   fluticasone 50 MCG/ACT nasal spray Commonly known as: FLONASE Place 2 sprays into both nostrils daily as needed for allergies or rhinitis.   furosemide 40 MG tablet Commonly known as: Lasix Take 1 tablet (40 mg total) by mouth daily for 5 days.   gabapentin 400 MG capsule Commonly known as: NEURONTIN Take 400 mg by mouth 3 (three) times daily.   hydrOXYzine 25 MG capsule Commonly known as: VISTARIL Take 25 mg by mouth 3 (three) times daily.   losartan 50 MG tablet Commonly known as: COZAAR Take 25 mg by mouth daily. What changed: Another medication with the same name was removed. Continue taking this medication, and follow the directions you see here.   methocarbamol 500 MG tablet Commonly known as: Robaxin Take 1 tablet (500 mg total) by mouth 2 (two) times daily as needed for muscle spasms.   metoprolol tartrate 25 MG tablet Commonly known as: LOPRESSOR Take 25 mg by mouth 2 (two) times daily.   metoprolol tartrate 50 MG tablet Commonly known as: LOPRESSOR Take 1 tablet (50 mg total) by mouth 2 (two) times daily.   montelukast 10 MG tablet Commonly known as: SINGULAIR Take 10 mg by mouth daily.   omeprazole 20 MG capsule Commonly known as: PRILOSEC Take 20 mg by mouth 2 (two) times daily. What changed: Another medication with the same name was removed. Continue taking this medication, and follow the directions you see here.   ondansetron 4 MG tablet Commonly known as: Zofran Take 1 tablet (4 mg total) by mouth  every 8 (eight) hours as needed for nausea or vomiting.   oxyCODONE-acetaminophen 5-325 MG tablet Commonly known as: Percocet Take 1-2 tablets by mouth every 6 (six) hours as needed for severe pain.   sertraline 50 MG tablet Commonly known as: ZOLOFT Take 50 mg by mouth daily.   Vitamin D3 50 MCG (2000 UT) Tabs Take 2,000 Units by mouth 2 (two) times daily.            Durable Medical Equipment  (From admission, onward)         Start     Ordered   03/09/19 1841  DME Walker rolling  Once    Question:  Patient needs a walker  to treat with the following condition  Answer:  History of hip replacement   03/09/19 1840   03/09/19 1841  DME 3 n 1  Once     03/09/19 1840   03/09/19 1841  DME Bedside commode  Once    Question:  Patient needs a bedside commode to treat with the following condition  Answer:  History of hip replacement   03/09/19 1840          Diagnostic Studies: Dg Chest 2 View  Result Date: 03/09/2019 CLINICAL DATA:  Preoperative chest exam. Left hip surgery. EXAM: CHEST - 2 VIEW COMPARISON:  01/19/2019 FINDINGS: Previous sternotomy and aortic valve replacement. Heart size remains stable. The left chest is clear. Chronic pleural and parenchymal scarring on the right. No new finding. Ordinary degenerative changes affect the spine. IMPRESSION: No change. No active disease. Chronic pleural and parenchymal scarring on the right. Previous aortic valve replacement. Electronically Signed   By: Nelson Chimes M.D.   On: 03/09/2019 12:55   Dg Pelvis Portable  Result Date: 03/09/2019 CLINICAL DATA:  Primary osteoarthritis of the left hip. Status post left total hip replacement. EXAM: PORTABLE PELVIS 1-2 VIEWS COMPARISON:  Radiographs dated 02/10/2019 FINDINGS: The acetabular and femoral components of the left total hip prosthesis appear in excellent position in the AP projection. No fractures. Expected postsurgical air in the soft tissues lateral to the left hip. Pelvic bones  appear normal. IMPRESSION: Satisfactory postoperative appearance of the left hip in the AP projection after total hip replacement. Electronically Signed   By: Lorriane Shire M.D.   On: 03/09/2019 17:48   Dg C-arm 1-60 Min  Result Date: 03/09/2019 CLINICAL DATA:  Left anterior total hip arthroplasty. EXAM: DG HIP (WITH OR WITHOUT PELVIS) 2-3V LEFT; DG C-ARM 1-60 MIN FLUOROSCOPY TIME:  28 seconds. COMPARISON:  None. FINDINGS: Two intraoperative fluoroscopic images were obtained of the left hip. The left acetabular and femoral components appear to be well situated. Expected postoperative changes are noted in the surrounding soft tissues. IMPRESSION: Fluoroscopic guidance was provided during left total hip arthroplasty. Electronically Signed   By: Marijo Conception M.D.   On: 03/09/2019 16:31   Dg Hip Unilat With Pelvis 2-3 Views Left  Result Date: 03/09/2019 CLINICAL DATA:  Left anterior total hip arthroplasty. EXAM: DG HIP (WITH OR WITHOUT PELVIS) 2-3V LEFT; DG C-ARM 1-60 MIN FLUOROSCOPY TIME:  28 seconds. COMPARISON:  None. FINDINGS: Two intraoperative fluoroscopic images were obtained of the left hip. The left acetabular and femoral components appear to be well situated. Expected postoperative changes are noted in the surrounding soft tissues. IMPRESSION: Fluoroscopic guidance was provided during left total hip arthroplasty. Electronically Signed   By: Marijo Conception M.D.   On: 03/09/2019 16:31   Xr Hip Unilat W Or W/o Pelvis 2-3 Views Left  Result Date: 02/10/2019 Advanced left hip DJD with bone-on-bone joint space narrowing.  Significant progression compared to prior x-ray 10 months ago.   Disposition: Discharge disposition: 01-Home or Self Care         Follow-up Information    Leandrew Koyanagi, MD. Schedule an appointment as soon as possible for a visit in 2 weeks.   Specialty: Orthopedic Surgery Contact information: [place] [address] [phone]             Signed: Aundra Dubin 03/11/2019, 7:59 AM

## 2019-03-11 NOTE — Progress Notes (Signed)
Physical Therapy Discharge Patient Details Name: Charles Riggs. MRN: [id number] DOB: 12-02-1955 Today's Date: 03/11/2019 Time: 7672-0947 PT Time Calculation (min) (ACUTE ONLY): 21 min  Patient discharged from PT services secondary to goals met and no further PT needs identified.  Please see latest therapy progress note for current level of functioning and progress toward goals.    Progress and discharge plan discussed with patient and/or caregiver: Patient/Caregiver agrees with plan  GP     Denice Paradise 03/11/2019, 9:29 AM  [facility] Pager:  [phone]  Office:  [phone]

## 2019-03-11 NOTE — TOC Transition Note (Signed)
Transition of Care [facility]) - CM/SW Discharge Note   Patient Details  Name: Charles Riggs. MRN: [id number] Date of Birth: 06-05-1955  [facility] CM/SW Contact:  Benard Halsted, LCSW Phone Number: 03/11/2019, 1:21 PM   Clinical Narrative:    CSW received request for 3in1. CSW faxed order to the [place] [facility] f. [phone]) for delivery to patient's home after discharge. No other needs identified at this time (PT recommended no further needs).    Final next level of care: Home/Self Care Barriers to Discharge: No Barriers Identified   Patient Goals and CMS Choice Patient states their goals for this hospitalization and ongoing recovery are:: Return home CMS Medicare.gov Compare Post Acute Care list provided to:: Patient Choice offered to / list presented to : Patient  Discharge Placement                       Discharge Plan and Services In-house Referral: NA Discharge Planning Services: CM Consult Post Acute Care Choice: Durable Medical Equipment          DME Arranged: 3-N-1 DME Agency: [place] Date DME Agency Contacted: 03/11/19 Time DME Agency Contacted: [id number] Representative spoke with at DME Agency: [place]: NA [place] Agency: NA        Social Determinants of Health ([place]) Interventions     Readmission Risk Interventions Readmission Risk Prevention Plan 01/07/2019 12/18/2018  Transportation Screening Complete Complete  PCP or Specialist Appt within 3-5 Days - Complete  HRI or [place] - Complete  Social Work Consult for [place] Planning/Counseling - Complete  Palliative Care Screening - Not Applicable  Medication Review Press photographer) Complete Referral to Pharmacy  PCP or Specialist appointment within 3-5 days of discharge Not Complete -  PCP/Specialist Appt Not Complete comments CM requested follow up appt with PCP at [facility] or [place] Complete -  Palliative Care Screening Not Applicable  -  [place] Not Applicable -  Some recent data might be hidden

## 2019-03-11 NOTE — Progress Notes (Signed)
Physical Therapy Treatment and D/C Patient Details Name: Charles Riggs. MRN: [id number] DOB: 1955-08-04 Today's Date: 03/11/2019    History of Present Illness Pt admit for left direct anterior hip replacement.  WBAT left LE.  PMH:  Diastolic heart failure, syncope, AVR, asthma    PT Comments    Pt admitted with above diagnosis.  Pt was able to ambulate on unit with RW with Modif I.  Pt met goals and performing exercises independently.  No further skilled PT needs and is discharging home today.    Follow Up Recommendations  No PT follow up;Supervision - Intermittent     Equipment Recommendations  None recommended by PT    Recommendations for Other Services       Precautions / Restrictions Precautions Precautions: None;Anterior Hip Precaution Booklet Issued: Yes (comment) Precaution Comments: pt has no hip precautions Restrictions Weight Bearing Restrictions: Yes LLE Weight Bearing: Weight bearing as tolerated    Mobility  Bed Mobility Overal bed mobility: Independent                Transfers Overall transfer level: Needs assistance Equipment used: Rolling walker (2 wheeled) Transfers: Sit to/from Stand Sit to Stand: Modified independent (Device/Increase time)         General transfer comment: cues for hand placement   Ambulation/Gait Ambulation/Gait assistance: Modified independent (Device/Increase time) Gait Distance (Feet): 550 Feet Assistive device: Rolling walker (2 wheeled) Gait Pattern/deviations: Step-through pattern;Decreased stride length   Gait velocity interpretation: 1.31 - 2.62 ft/sec, indicative of limited community ambulator General Gait Details: Pt did well with ambulation with RW and didnot need cues.  Safe with RW and plans to use initiaily at home.    Stairs             Wheelchair Mobility    Modified Rankin (Stroke Patients Only)       Balance Overall balance assessment: Needs assistance Sitting-balance support:  No upper extremity supported;Feet supported Sitting balance-Leahy Scale: Good     Standing balance support: During functional activity;No upper extremity supported Standing balance-Leahy Scale: Fair Standing balance comment: can stand statically without UE support                            Cognition Arousal/Alertness: Awake/alert Behavior During Therapy: WFL for tasks assessed/performed Overall Cognitive Status: Within Functional Limits for tasks assessed                                        Exercises Other Exercises Other Exercises: Pt reports he did all his exercises this am already.     General Comments General comments (skin integrity, edema, etc.): Pt was able to don TED hose with  alittle help with left LE only. Filled ice packs for pt for hips.       Pertinent Vitals/Pain Pain Assessment: 0-10 Pain Score: 2  Pain Location: left surgical pain Pain Descriptors / Indicators: Aching;Burning Pain Intervention(s): Limited activity within patient's tolerance;Monitored during session;Premedicated before session;Repositioned;Ice applied    Home Living                      Prior Function            PT Goals (current goals can now be found in the care plan section) Acute Rehab PT Goals Patient Stated Goal: to go home PT Goal  Formulation: All assessment and education complete, DC therapy Progress towards PT goals: Goals met/education completed, patient discharged from PT    Frequency    7X/week      PT Plan Current plan remains appropriate    Co-evaluation              AM-PAC PT "6 Clicks" Mobility   Outcome Measure  Help needed turning from your back to your side while in a flat bed without using bedrails?: None Help needed moving from lying on your back to sitting on the side of a flat bed without using bedrails?: None Help needed moving to and from a bed to a chair (including a wheelchair)?: None Help needed  standing up from a chair using your arms (e.g., wheelchair or bedside chair)?: None Help needed to walk in hospital room?: None Help needed climbing 3-5 steps with a railing? : A Little 6 Click Score: 23    End of Session Equipment Utilized During Treatment: Gait belt Activity Tolerance: Patient tolerated treatment well Patient left: in chair;with call bell/phone within reach Nurse Communication: Mobility status PT Visit Diagnosis: Muscle weakness (generalized) (M62.81);Pain Pain - Right/Left: Left Pain - part of body: Leg     Time: 6160-7371 PT Time Calculation (min) (ACUTE ONLY): 21 min  Charges:  $Gait Training: 8-22 mins                     Mor[place] Pager:  [phone]  Office:  [place] 03/11/2019, 9:27 AM

## 2019-03-13 ENCOUNTER — Other Ambulatory Visit: Payer: Self-pay

## 2019-03-13 ENCOUNTER — Telehealth: Payer: Self-pay | Admitting: Orthopaedic Surgery

## 2019-03-13 ENCOUNTER — Telehealth: Payer: Self-pay

## 2019-03-13 MED ORDER — ASPIRIN EC 81 MG PO TBEC: 81.0000 mg | DELAYED_RELEASE_TABLET | Freq: Every day | ORAL | 1 refills | Status: AC

## 2019-03-13 MED ORDER — ROSUVASTATIN CALCIUM 10 MG PO TABS: 10.0000 mg | ORAL_TABLET | Freq: Every day | ORAL | 1 refills | Status: DC

## 2019-03-13 MED ORDER — LOSARTAN POTASSIUM 25 MG PO TABS: 25.0000 mg | ORAL_TABLET | Freq: Every day | ORAL | 1 refills | Status: DC

## 2019-03-13 MED ORDER — METOPROLOL TARTRATE 50 MG PO TABS: 50.0000 mg | ORAL_TABLET | Freq: Two times a day (BID) | ORAL | 1 refills | Status: DC

## 2019-03-13 NOTE — Telephone Encounter (Signed)
See below

## 2019-03-13 NOTE — Telephone Encounter (Signed)
Patient called. He would like his medication sent to [facility].

## 2019-03-13 NOTE — Telephone Encounter (Signed)
Patient was D/C from hospital on 03/12/2019 for Left Hip surgery. He c/o of redness and soreness, with no drainage and some swelling and numbness. Patient will like for an ABX and betadine swabs to be sent to his pharmacy. Please advise.  FYI: Patient appt scheduled for 03/25/2019

## 2019-03-16 ENCOUNTER — Other Ambulatory Visit: Payer: Self-pay | Admitting: Orthopaedic Surgery

## 2019-03-16 ENCOUNTER — Telehealth: Payer: Self-pay | Admitting: Radiology

## 2019-03-16 ENCOUNTER — Other Ambulatory Visit: Payer: Self-pay | Admitting: Physician Assistant

## 2019-03-16 ENCOUNTER — Encounter: Payer: Self-pay | Admitting: Physician Assistant

## 2019-03-16 ENCOUNTER — Other Ambulatory Visit: Payer: Self-pay

## 2019-03-16 ENCOUNTER — Telehealth: Payer: Self-pay | Admitting: Orthopaedic Surgery

## 2019-03-16 ENCOUNTER — Ambulatory Visit (INDEPENDENT_AMBULATORY_CARE_PROVIDER_SITE_OTHER): Payer: No Typology Code available for payment source | Admitting: Physician Assistant

## 2019-03-16 DIAGNOSIS — Z96642 Presence of left artificial hip joint: Secondary | ICD-10-CM

## 2019-03-16 MED ORDER — OXYCODONE-ACETAMINOPHEN 5-325 MG PO TABS: 1.0000 | ORAL_TABLET | Freq: Three times a day (TID) | ORAL | 0 refills | Status: DC | PRN

## 2019-03-16 MED ORDER — SULFAMETHOXAZOLE-TRIMETHOPRIM 800-160 MG PO TABS: 1.0000 | ORAL_TABLET | Freq: Two times a day (BID) | ORAL | 0 refills | Status: DC

## 2019-03-16 NOTE — Telephone Encounter (Signed)
Patient was called and lvm to return callback to get scheduled to be seen

## 2019-03-16 NOTE — Telephone Encounter (Signed)
He was seen by Artis Delay today. See OV note and other messages.

## 2019-03-16 NOTE — Telephone Encounter (Signed)
Needs to come in to be seen

## 2019-03-16 NOTE — Telephone Encounter (Signed)
Patient called stating that he has tried since Friday to talk to someone about his concerns in regards to his surgery.  I tried to get him to talk to Triage, but he stated no one answered or called him back after leaving a message.  He would like to talk to someone about his concerns.  CB#[phone].  Thank you.

## 2019-03-16 NOTE — Telephone Encounter (Signed)
Tell him he needs to clean cpap.  I'll send abx for him.

## 2019-03-16 NOTE — Telephone Encounter (Signed)
He's also requesting refill for pain medication or an increase in dosage. Stated he was almost out in office today.

## 2019-03-16 NOTE — Telephone Encounter (Signed)
Patient came into office to see Artis Delay today, complaining of swelling, redness, and burning post op.  During visit patient requested for pain medication to be increased, concerned about MRSA, and that he has used his CPAP machine without cleaning it. Can you please advise.

## 2019-03-16 NOTE — Progress Notes (Signed)
HPI: Charles Riggs is 7 days status post left total hip arthroplasty by Dr.Xu.  He reports that he is having some swelling and some drainage about his left hip.  Has had no fever chills.  He is having some burning sensation he is on Neurontin for this is unsure if it really helps.  Is also asking for increase in his pain medication due to the pain that he is having left hip.  Review of systems see HPI otherwise negative  Physical exam left hip surgical incisions healing well no signs of infection.  No active drainage.  Positive seroma.  Hips prepped with Betadine and 70 cc of serous sanguinous fluid is aspirated patient tolerates well.  Patient ambulates well with a rolling walker is able get on and off the exam table on his own.  Impression: Status post left total hip arthroplasty 03/09/2019  Plan: We will send a message to Dr. Erlinda Hong to see if he wants to refill his current pain medication or increase the dosage.  Patient also ask about him having MRSA and he is gone home and used his CPAP without cleaning he is has some concerns about whether he should be treated again for MRSA we will leave this up to Dr. Erlinda Hong.  Follow-up Dr. Erlinda Hong next week as planned.  New Steri-Strips and Aquacel dressing were applied. Questions encouraged and answered

## 2019-03-19 ENCOUNTER — Telehealth: Payer: Self-pay | Admitting: Orthopaedic Surgery

## 2019-03-19 NOTE — Telephone Encounter (Signed)
Patient left a message stating that the fluid has returned to his hip and wants to know what he can do, he does not think it will be good over the weekend.  CB#[phone].  Thank you.

## 2019-03-20 ENCOUNTER — Other Ambulatory Visit: Payer: Self-pay | Admitting: Thoracic Surgery (Cardiothoracic Vascular Surgery)

## 2019-03-20 DIAGNOSIS — Z952 Presence of prosthetic heart valve: Secondary | ICD-10-CM

## 2019-03-23 ENCOUNTER — Encounter: Payer: Self-pay | Admitting: Thoracic Surgery (Cardiothoracic Vascular Surgery)

## 2019-03-23 ENCOUNTER — Other Ambulatory Visit: Payer: Self-pay

## 2019-03-23 ENCOUNTER — Ambulatory Visit (INDEPENDENT_AMBULATORY_CARE_PROVIDER_SITE_OTHER): Payer: No Typology Code available for payment source | Admitting: Thoracic Surgery (Cardiothoracic Vascular Surgery)

## 2019-03-23 ENCOUNTER — Ambulatory Visit
Admission: RE | Admit: 2019-03-23 | Discharge: 2019-03-23 | Disposition: A | Discharge status: Home / Self Care | Payer: No Typology Code available for payment source | Source: Ambulatory Visit | Attending: Thoracic Surgery (Cardiothoracic Vascular Surgery) | Admitting: Thoracic Surgery (Cardiothoracic Vascular Surgery)

## 2019-03-23 VITALS — BP 104/67 | HR 68 | Temp 98.1°F | Resp 16 | Ht 71.0 in | Wt 235.0 lb

## 2019-03-23 DIAGNOSIS — Z952 Presence of prosthetic heart valve: Secondary | ICD-10-CM

## 2019-03-23 DIAGNOSIS — Z953 Presence of xenogenic heart valve: Secondary | ICD-10-CM | POA: Diagnosis not present

## 2019-03-23 NOTE — Progress Notes (Signed)
Charles Riggs       , Charles Riggs             [phone]     CARDIOTHORACIC SURGERY OFFICE NOTE  Referring Provider is [name], Reynold Bowen, MD PCP is Vanetta Mulders, MD   HPI:  Patient is a 63 year old African-American male with history ofquadricuspid aortic valve andsevere aortic insufficiency, hypertension, left bundle branch block, asthma, obstructive sleep apnea, abnormal stress test and remote history of tobacco use whounderwent minimally invasive aortic valve replacement using a stented bovine pericardial tissue valve on December 16, 2018 via right minithoracotomy approach.  Follow-up echocardiogram performed December 29, 2018 revealed mildly reduced left ventricular systolic function with ejection fraction estimated 45 to 50% and normal functioning bioprosthetic tissue valve in the aortic position with no aortic insufficiency and mean transvalvular gradient estimated 13 mmHg.  He was last seen here in our office on January 19, 2019 at which time he was recovering uneventfully and making good progress.  Since then he has continued to do well from a cardiac standpoint.  Prior to surgery he was having a lot of difficulty with pain across his back and in particular in his right hip.  He underwent injection of his right hip and did not experience any significant relief.  Earlier this month he underwent left total hip replacement.  He recovered from this uneventfully although he did later undergo needle aspiration of fluid around his new left hip prosthesis.  He returns to our office today and reports that from a cardiac standpoint he is doing exceptionally well.  He no longer has any pain in his chest related to his previous aortic valve replacement.  He has no difficulty with shortness of breath.  He has been recovering from his recent hip replacement without any significant problems.  He is still ambulating using a walker for safety but he states that his left hip  feels much better than it did before.  He still has severe chronic pain in his right hip and lower back.   Current Outpatient Medications  Medication Sig Dispense Refill   albuterol (PROVENTIL HFA;VENTOLIN HFA) 108 (90 Base) MCG/ACT inhaler Inhale 2 puffs into the lungs every 6 (six) hours as needed for wheezing or shortness of breath.     albuterol (PROVENTIL) (2.5 MG/3ML) 0.083% nebulizer solution Take 3 mLs (2.5 mg total) by nebulization every 6 (six) hours as needed for wheezing or shortness of breath. 75 mL 12   amLODipine (NORVASC) 10 MG tablet Take 10 mg by mouth daily.     aspirin EC 81 MG tablet Take 1 tablet (81 mg total) by mouth daily. 90 tablet 1   bisacodyl (DULCOLAX) 5 MG EC tablet Take 1 tablet (5 mg total) by mouth daily as needed for moderate constipation. 30 tablet 1   budesonide-formoterol (SYMBICORT) 160-4.5 MCG/ACT inhaler Inhale 2 puffs into the lungs 2 (two) times daily. 1 Inhaler 1   Cholecalciferol (VITAMIN D3) 50 MCG (2000 UT) TABS Take 2,000 Units by mouth 2 (two) times daily.     COMBIVENT RESPIMAT 20-100 MCG/ACT AERS respimat Inhale 2 puffs into the lungs daily as needed for shortness of breath. 1 Inhaler 0   dextromethorphan-guaiFENesin (MUCINEX DM) 30-600 MG 12hr tablet Take 1 tablet by mouth 2 (two) times daily.     fluticasone (FLONASE) 50 MCG/ACT nasal spray Place 2 sprays into both nostrils daily as needed for allergies or rhinitis.     furosemide (LASIX) 40  MG tablet Take 1 tablet (40 mg total) by mouth daily for 5 days. 30 tablet 0   gabapentin (NEURONTIN) 400 MG capsule Take 400 mg by mouth 3 (three) times daily.      hydrOXYzine (VISTARIL) 25 MG capsule Take 25 mg by mouth 3 (three) times daily.     losartan (COZAAR) 25 MG tablet Take 1 tablet (25 mg total) by mouth daily. 90 tablet 1   methocarbamol (ROBAXIN) 500 MG tablet Take 1 tablet (500 mg total) by mouth 2 (two) times daily as needed for muscle spasms. 30 tablet 0   metoprolol  tartrate (LOPRESSOR) 25 MG tablet Take 25 mg by mouth 2 (two) times daily.     metoprolol tartrate (LOPRESSOR) 50 MG tablet Take 1 tablet (50 mg total) by mouth 2 (two) times daily. 180 tablet 1   montelukast (SINGULAIR) 10 MG tablet Take 10 mg by mouth daily.      omeprazole (PRILOSEC) 20 MG capsule Take 20 mg by mouth 2 (two) times daily.     ondansetron (ZOFRAN) 4 MG tablet Take 1 tablet (4 mg total) by mouth every 8 (eight) hours as needed for nausea or vomiting. 40 tablet 0   oxyCODONE-acetaminophen (PERCOCET) 5-325 MG tablet Take 1-2 tablets by mouth every 6 (six) hours as needed for severe pain. 40 tablet 0   oxyCODONE-acetaminophen (PERCOCET) 5-325 MG tablet Take 1-2 tablets by mouth every 8 (eight) hours as needed for severe pain. 30 tablet 0   rosuvastatin (CRESTOR) 10 MG tablet Take 1 tablet (10 mg total) by mouth daily. 90 tablet 1   sertraline (ZOLOFT) 50 MG tablet Take 50 mg by mouth daily.     sulfamethoxazole-trimethoprim (BACTRIM DS) 800-160 MG tablet Take 1 tablet by mouth 2 (two) times daily. 20 tablet 0   No current facility-administered medications for this visit.       Physical Exam:   BP 104/67 (BP Location: Right Arm, Patient Position: Sitting, Cuff Size: Normal)    Pulse 68    Temp 98.1 F (36.7 C)    Resp 16    Ht 5\' 11"  (1.803 m)    Wt 235 lb (106.6 kg)    SpO2 96% Comment: RA   BMI 32.78 kg/m   General:  Well-appearing  Chest:   Clear to auscultation  CV:   Regular rate and rhythm without murmur  Incisions:  Completely healed  Abdomen:  Soft nontender  Extremities:  Warm and well-perfused  Diagnostic Tests:  CHEST - 2 VIEW  COMPARISON:  March 09, 2019  FINDINGS: There is a small right pleural effusion with right base atelectasis. The lungs elsewhere are clear. The heart size and pulmonary vascularity are normal. Patient is status post aortic valve replacement. No adenopathy. No pneumothorax. Postoperative changes in the sternum  noted.  IMPRESSION: Small right pleural effusion with right base atelectasis. Lungs elsewhere clear. Cardiac silhouette within normal limits. Postoperative changes noted. No pneumothorax evident.   Electronically Signed   By: Lowella Grip III M.D.   On: 03/23/2019 09:15   Impression:  Patient is doing well approximately 3 months status post minimally invasive aortic valve replacement using a stented bioprosthetic tissue valve.   Plan:  We have not recommended any change the patient's current medications.  I have instructed the patient that he no longer has any physical restrictions related to his aortic valve replacement surgery.  The patient has been reminded regarding the importance of dental hygiene and the lifelong need for antibiotic prophylaxis for  all dental cleanings and other related invasive procedures.  The patient will continue to follow-up with Dr. Virgina Jock and return to our office for routine follow-up next July, approximately 1 year following his aortic valve replacement.  He will call and return sooner should specific problems or questions arise.   I spent in excess of 15 minutes during the conduct of this office consultation and >50% of this time involved direct face-to-face encounter with the patient for counseling and/or coordination of their care.    Valentina Gu. Roxy Manns, MD 03/23/2019 9:35 AM

## 2019-03-23 NOTE — Patient Instructions (Signed)

## 2019-03-25 ENCOUNTER — Ambulatory Visit: Payer: No Typology Code available for payment source | Admitting: Orthopaedic Surgery

## 2019-03-25 NOTE — Telephone Encounter (Signed)
Patient has appt scheduled for tomorrow

## 2019-03-26 ENCOUNTER — Ambulatory Visit (INDEPENDENT_AMBULATORY_CARE_PROVIDER_SITE_OTHER): Payer: No Typology Code available for payment source | Admitting: Orthopaedic Surgery

## 2019-03-26 ENCOUNTER — Encounter: Payer: Self-pay | Admitting: Orthopaedic Surgery

## 2019-03-26 ENCOUNTER — Other Ambulatory Visit: Payer: Self-pay

## 2019-03-26 DIAGNOSIS — Z96642 Presence of left artificial hip joint: Secondary | ICD-10-CM

## 2019-03-26 MED ORDER — SULFAMETHOXAZOLE-TRIMETHOPRIM 800-160 MG PO TABS: 1.0000 | ORAL_TABLET | Freq: Two times a day (BID) | ORAL | 0 refills | Status: DC

## 2019-03-26 MED ORDER — HYDROCODONE-ACETAMINOPHEN 5-325 MG PO TABS: 1.0000 | ORAL_TABLET | Freq: Every day | ORAL | 0 refills | Status: DC | PRN

## 2019-03-26 MED ORDER — METHOCARBAMOL 750 MG PO TABS: 750.0000 mg | ORAL_TABLET | Freq: Two times a day (BID) | ORAL | 0 refills | Status: DC | PRN

## 2019-03-26 NOTE — Progress Notes (Signed)
Post-Op Visit Note   Patient: Charles Riggs.           Date of Birth: 12/24/1955           MRN: [id number] Visit Date: 03/26/2019 PCP: Vanetta Mulders, MD   Assessment & Plan:  Chief Complaint:  Chief Complaint  Patient presents with  . Left Hip - Pain, Routine Post Op   Visit Diagnoses:  1. Status post left hip replacement     Plan: Patient is a pleasant 63 year old gentleman who presents our clinic today 17 days status post left total hip replacement, date of surgery 03/09/2019.  He has been doing well.  He was seen earlier in our office by Benita Stabile where it was noted that he had a seroma which was drained.  He was placed on Bactrim.  He comes in today for follow-up.  No fevers or chills.  Examination of the left hip shows a moderate size seroma.  This is nontender.  He does appear to have a well-healed incision without cellulitis or infection.  Calf is soft nontender.  He is neurovascular intact distally.  Today, the seroma was redrained. 110 cc of serosanguinous fluid.  We will restart his bactrim.  He was instructed to ice the thigh regularly as he has not been doing so.  He will follow up with [place] in 4 weeks for recheck with pelvis xrays.  Call with concerns or questions in the meantime.   Follow-Up Instructions: Return in about 4 weeks (around 04/23/2019).   Orders:  No orders of the defined types were placed in this encounter.  Meds ordered this encounter  Medications  . sulfamethoxazole-trimethoprim (BACTRIM DS) 800-160 MG tablet    Sig: Take 1 tablet by mouth 2 (two) times daily.    Dispense:  20 tablet    Refill:  0    Imaging: No new imaging  PMFS History: Patient Active Problem List   Diagnosis Date Noted  . Status post total replacement of left hip 03/09/2019  . Primary osteoarthritis of left hip 02/10/2019  . Acute respiratory failure with hypoxemia ([place]) 01/04/2019  . Pleural effusion 12/28/2018  . Syncope 12/28/2018  . Acute blood loss anemia    . S/P AVR (aortic valve replacement) 12/16/2018  . Anemia in chronic kidney disease   . Chronic kidney disease (CKD)   . Elevated troponin 08/29/2018  . Severe aortic insufficiency   . Acute on chronic diastolic heart failure ([place])   . Nonrheumatic aortic valve insufficiency   . Essential hypertension   . Obstructive sleep apnea   . Quadricuspid aortic valve   . Chest pain in adult 08/27/2018  . LEG PAIN 04/26/2010  . DEPRESSION 08/19/2009  . RENAL CALCULUS 01/05/2009  . DM 10/12/2008  . Personal history presenting hazards to health 06/16/2007  . Asthma 03/24/2007  . GERD 03/24/2007   Past Medical History:  Diagnosis Date  . Acute on chronic diastolic heart failure ([place])   . Anemia in chronic kidney disease   . Anxiety   . Aortic insufficiency   . Arthritis   . Asthma   . CHF (congestive heart failure) ([place])   . CKD (chronic kidney disease)   . Dyspnea   . Essential hypertension   . Nerve pain   . Non-rheumatic aortic regurgitation   . Obstructive sleep apnea    cpap  . Quadricuspid aortic valve   . S/P minimally invasive aortic valve replacement with bioprosthetic valve 12/16/2018   25 mm  [place] Inspiris Resilia stented bovine pericardial tissue valve via right mini thoracotomy approach  . Severe aortic insufficiency   . SOB (shortness of breath)   . Syncope 12/2018    Family History  Problem Relation Age of Onset  . COPD Father   . Cancer Brother     Past Surgical History:  Procedure Laterality Date  . AORTIC VALVE REPLACEMENT N/A 12/16/2018   Procedure: MINIMALLY INVASIVE AORTIC VALVE REPLACEMENT (AVR) using Inspiris Aortic valve 65mm.;  Surgeon: Rexene Alberts, MD;  Location: [place];  Service: Open Heart Surgery;  Laterality: N/A;  . HERNIA REPAIR    . IR THORACENTESIS ASP PLEURAL SPACE W/IMG GUIDE  12/29/2018  . RIB PLATING  12/16/2018   Procedure: Rib Plating;  Surgeon: Rexene Alberts, MD;  Location: [facility] OR;  Service: Open Heart Surgery;;  . RIGHT/LEFT  HEART CATH AND CORONARY ANGIOGRAPHY N/A 10/14/2018   Procedure: RIGHT/LEFT HEART CATH AND CORONARY ANGIOGRAPHY;  Surgeon: Nigel Mormon, MD;  Location: [place] CV LAB;  Service: Cardiovascular;  Laterality: N/A;  . TEE WITHOUT CARDIOVERSION N/A 11/11/2018   Procedure: TRANSESOPHAGEAL ECHOCARDIOGRAM (TEE);  Surgeon: Nigel Mormon, MD;  Location: [facility] ENDOSCOPY;  Service: Cardiovascular;  Laterality: N/A;  . TEE WITHOUT CARDIOVERSION N/A 12/16/2018   Procedure: TRANSESOPHAGEAL ECHOCARDIOGRAM (TEE);  Surgeon: Rexene Alberts, MD;  Location: [place];  Service: Open Heart Surgery;  Laterality: N/A;  . TOTAL HIP ARTHROPLASTY Left 03/09/2019   Procedure: LEFT TOTAL HIP ARTHROPLASTY ANTERIOR APPROACH;  Surgeon: Leandrew Koyanagi, MD;  Location: [facility];  Service: Orthopedics;  Laterality: Left;   Social History   Occupational History  . Not on file  Tobacco Use  . Smoking status: Former Smoker    Types: Cigarettes  . Smokeless tobacco: Never Used  . Tobacco comment: 30 PY  Substance and Sexual Activity  . Alcohol use: No  . Drug use: No  . Sexual activity: Not on file

## 2019-03-27 ENCOUNTER — Encounter: Payer: Self-pay | Admitting: Cardiology

## 2019-03-27 ENCOUNTER — Telehealth: Payer: Self-pay

## 2019-03-27 NOTE — Telephone Encounter (Signed)
He is 3 months out of his surgery. Okay to resume driving.

## 2019-03-27 NOTE — Telephone Encounter (Signed)
His job is driving trucks

## 2019-03-27 NOTE — Telephone Encounter (Signed)
Pt called asking [place] if we could write him a letter for clearance for him to go back to work due that he had surgery in July. Pt was a little upset due that he called in September and was told that you were not able to write the letter. Please advise. Thank you

## 2019-03-27 NOTE — Telephone Encounter (Signed)
Previous letter he sough was pertaining to his driving. I am unable to provide a letter re: driving as I did not recommend him to stop driving. Thus, I am not aware of any other medical reason that could preclude him from driving. I am happy to write a letter to let him return to work, and it is available on his chart now.  Thanks MJP

## 2019-03-31 ENCOUNTER — Other Ambulatory Visit: Payer: Self-pay | Admitting: Physician Assistant

## 2019-04-06 ENCOUNTER — Telehealth: Payer: Self-pay | Admitting: Orthopaedic Surgery

## 2019-04-06 ENCOUNTER — Telehealth: Payer: Self-pay

## 2019-04-06 NOTE — Telephone Encounter (Signed)
Patient left a voicemail wanting to speak with you, he has a question.  CB#[phone].  Thank you.

## 2019-04-06 NOTE — Telephone Encounter (Signed)
Pt called asking for refills on his Hydrocodone, Methocarbamol, and Sulfamethoxazole. Informed pt we were not able to refill these medication due that Dr. Virgina Jock has never prescribe it to him. Pt mention he might be having a ear infection. Informed pt to go to his PCP to get that checked out.

## 2019-04-06 NOTE — Telephone Encounter (Signed)
I called patient. He would like to know if he can drive.  He is post left total hip replacement on 03/09/2019 and states that he is not taking pain medication.  Please advise.

## 2019-04-07 NOTE — Telephone Encounter (Signed)
I called patient and advised ok to drive as long as he is not taking pain medication.

## 2019-04-07 NOTE — Telephone Encounter (Signed)
yes

## 2019-04-15 NOTE — Telephone Encounter (Signed)
error 

## 2019-04-17 ENCOUNTER — Ambulatory Visit (INDEPENDENT_AMBULATORY_CARE_PROVIDER_SITE_OTHER): Payer: No Typology Code available for payment source | Admitting: Cardiology

## 2019-04-17 ENCOUNTER — Encounter: Payer: Self-pay | Admitting: Cardiology

## 2019-04-17 ENCOUNTER — Other Ambulatory Visit: Payer: Self-pay

## 2019-04-17 VITALS — BP 126/78 | HR 86 | Temp 97.1°F | Ht 71.0 in | Wt 238.5 lb

## 2019-04-17 DIAGNOSIS — I10 Essential (primary) hypertension: Secondary | ICD-10-CM

## 2019-04-17 DIAGNOSIS — Z952 Presence of prosthetic heart valve: Secondary | ICD-10-CM

## 2019-04-17 DIAGNOSIS — I2510 Atherosclerotic heart disease of native coronary artery without angina pectoris: Secondary | ICD-10-CM

## 2019-04-17 MED ORDER — CARVEDILOL 6.25 MG PO TABS: 6.2500 mg | ORAL_TABLET | Freq: Two times a day (BID) | ORAL | 3 refills | Status: DC

## 2019-04-17 NOTE — Progress Notes (Signed)
Subjective:   Charles Setters., male    DOB: 01-22-1956, 63 y.o.   MRN: [id number]   Chief complaint:  Aortic regurgitation  HPI  63 year old African-American male, 20 PY former smoker, OSA, LBBB, h/o asthma, nonobstructive CAD, s/p bioprosthetic AVR (25 mm) through minithoracotomy (12/16/2018 Dr Roxy Manns) for severe AI with quadricuspid aortic valve.   Patient had two syncopal events since his surgery, attributed to hypotension and bloody pleural effusions, treated with thoracentesis and IV iron. He has been doing well. Chest pain, shortness of breath has improved. BP is well controlled. He would like to come off some meds.    Past Medical History:  Diagnosis Date  . Acute on chronic diastolic heart failure ([place])   . Anemia in chronic kidney disease   . Anxiety   . Aortic insufficiency   . Arthritis   . Asthma   . CHF (congestive heart failure) ([place])   . CKD (chronic kidney disease)   . Dyspnea   . Essential hypertension   . Nerve pain   . Non-rheumatic aortic regurgitation   . Obstructive sleep apnea    cpap  . Quadricuspid aortic valve   . S/P minimally invasive aortic valve replacement with bioprosthetic valve 12/16/2018   25 mm Edwards Inspiris Resilia stented bovine pericardial tissue valve via right mini thoracotomy approach  . Severe aortic insufficiency   . SOB (shortness of breath)   . Syncope 12/2018    Past Surgical History:  Procedure Laterality Date  . AORTIC VALVE REPLACEMENT N/A 12/16/2018   Procedure: MINIMALLY INVASIVE AORTIC VALVE REPLACEMENT (AVR) using Inspiris Aortic valve 90mm.;  Surgeon: Rexene Alberts, MD;  Location: [place];  Service: Open Heart Surgery;  Laterality: N/A;  . HERNIA REPAIR    . IR THORACENTESIS ASP PLEURAL SPACE W/IMG GUIDE  12/29/2018  . RIB PLATING  12/16/2018   Procedure: Rib Plating;  Surgeon: Rexene Alberts, MD;  Location: [facility];  Service: Open Heart Surgery;;  . RIGHT/LEFT HEART CATH AND CORONARY ANGIOGRAPHY N/A  10/14/2018   Procedure: RIGHT/LEFT HEART CATH AND CORONARY ANGIOGRAPHY;  Surgeon: Nigel Mormon, MD;  Location: [place] CV LAB;  Service: Cardiovascular;  Laterality: N/A;  . TEE WITHOUT CARDIOVERSION N/A 11/11/2018   Procedure: TRANSESOPHAGEAL ECHOCARDIOGRAM (TEE);  Surgeon: Nigel Mormon, MD;  Location: [facility] ENDOSCOPY;  Service: Cardiovascular;  Laterality: N/A;  . TEE WITHOUT CARDIOVERSION N/A 12/16/2018   Procedure: TRANSESOPHAGEAL ECHOCARDIOGRAM (TEE);  Surgeon: Rexene Alberts, MD;  Location: [place];  Service: Open Heart Surgery;  Laterality: N/A;  . TOTAL HIP ARTHROPLASTY Left 03/09/2019   Procedure: LEFT TOTAL HIP ARTHROPLASTY ANTERIOR APPROACH;  Surgeon: Leandrew Koyanagi, MD;  Location: [place];  Service: Orthopedics;  Laterality: Left;    Social History   Socioeconomic History  . Marital status: Married    Spouse name: Not on file  . Number of children: 4  . Years of education: Not on file  . Highest education level: Not on file  Occupational History  . Not on file  Social Needs  . Financial resource strain: Not on file  . Food insecurity    Worry: Not on file    Inability: Not on file  . Transportation needs    Medical: Not on file    Non-medical: Not on file  Tobacco Use  . Smoking status: Former Smoker    Types: Cigarettes  . Smokeless tobacco: Never Used  . Tobacco comment: 30 PY  Substance and Sexual  Activity  . Alcohol use: No  . Drug use: No  . Sexual activity: Not on file  Lifestyle  . Physical activity    Days per week: Not on file    Minutes per session: Not on file  . Stress: Not on file  Relationships  . Social Herbalist on phone: Not on file    Gets together: Not on file    Attends religious service: Not on file    Active member of club or organization: Not on file    Attends meetings of clubs or organizations: Not on file    Relationship status: Not on file  . Intimate partner violence    Fear of current or ex partner: Not  on file    Emotionally abused: Not on file    Physically abused: Not on file    Forced sexual activity: Not on file  Other Topics Concern  . Not on file  Social History Narrative  . Not on file    Family History  Problem Relation Age of Onset  . COPD Father   . Cancer Brother     Current Outpatient Medications on File Prior to Visit  Medication Sig Dispense Refill  . albuterol (PROVENTIL HFA;VENTOLIN HFA) 108 (90 Base) MCG/ACT inhaler Inhale 2 puffs into the lungs every 6 (six) hours as needed for wheezing or shortness of breath.    Marland Kitchen albuterol (PROVENTIL) (2.5 MG/3ML) 0.083% nebulizer solution Take 3 mLs (2.5 mg total) by nebulization every 6 (six) hours as needed for wheezing or shortness of breath. 75 mL 12  . amLODipine (NORVASC) 10 MG tablet Take 10 mg by mouth daily.    Marland Kitchen aspirin EC 81 MG tablet Take 1 tablet (81 mg total) by mouth daily. 90 tablet 1  . bisacodyl (DULCOLAX) 5 MG EC tablet Take 1 tablet (5 mg total) by mouth daily as needed for moderate constipation. 30 tablet 1  . budesonide-formoterol (SYMBICORT) 160-4.5 MCG/ACT inhaler Inhale 2 puffs into the lungs 2 (two) times daily. 1 Inhaler 1  . Cholecalciferol (VITAMIN D3) 50 MCG (2000 UT) TABS Take 2,000 Units by mouth 2 (two) times daily.    . COMBIVENT RESPIMAT 20-100 MCG/ACT AERS respimat Inhale 2 puffs into the lungs daily as needed for shortness of breath. 1 Inhaler 0  . dextromethorphan-guaiFENesin (MUCINEX DM) 30-600 MG 12hr tablet Take 1 tablet by mouth 2 (two) times daily.    . fluticasone (FLONASE) 50 MCG/ACT nasal spray Place 2 sprays into both nostrils daily as needed for allergies or rhinitis.    . furosemide (LASIX) 40 MG tablet Take 1 tablet (40 mg total) by mouth daily for 5 days. 30 tablet 0  . gabapentin (NEURONTIN) 400 MG capsule Take 400 mg by mouth 3 (three) times daily.     . hydrOXYzine (VISTARIL) 25 MG capsule Take 25 mg by mouth 3 (three) times daily.    Marland Kitchen losartan (COZAAR) 25 MG tablet Take 1  tablet (25 mg total) by mouth daily. 90 tablet 1  . methocarbamol (ROBAXIN) 750 MG tablet Take 1 tablet (750 mg total) by mouth 2 (two) times daily as needed for muscle spasms. 60 tablet 0  . metoprolol tartrate (LOPRESSOR) 25 MG tablet Take 25 mg by mouth 2 (two) times daily.    . montelukast (SINGULAIR) 10 MG tablet Take 10 mg by mouth daily.     Marland Kitchen omeprazole (PRILOSEC) 20 MG capsule Take 20 mg by mouth 2 (two) times daily.    Marland Kitchen  ondansetron (ZOFRAN) 4 MG tablet Take 1 tablet (4 mg total) by mouth every 8 (eight) hours as needed for nausea or vomiting. 40 tablet 0  . rosuvastatin (CRESTOR) 10 MG tablet Take 1 tablet (10 mg total) by mouth daily. 90 tablet 1  . sertraline (ZOLOFT) 50 MG tablet Take 50 mg by mouth daily.    Marland Kitchen sulfamethoxazole-trimethoprim (BACTRIM DS) 800-160 MG tablet Take 1 tablet by mouth 2 (two) times daily. 20 tablet 0  . HYDROcodone-acetaminophen (NORCO) 5-325 MG tablet Take 1-2 tablets by mouth daily as needed. (Patient not taking: Reported on 04/17/2019) 30 tablet 0  . oxyCODONE-acetaminophen (PERCOCET) 5-325 MG tablet Take 1-2 tablets by mouth every 8 (eight) hours as needed for severe pain. (Patient not taking: Reported on 04/17/2019) 30 tablet 0   No current facility-administered medications on file prior to visit.     Cardiovascular studies:  Echocardiogram 12/29/2018:  1. The left ventricle has mildly reduced systolic function, with an ejection fraction of 45-50%. The cavity size was normal. Left ventricular diastolic parameters were normal. There is abnormal septal motion consistent with post-operative status.  2. The right ventricle has normal systolc function. There is no increase in right ventricular wall thickness.  3. Trivial circumferential pericardial effusion is present.  4. A 47mm an Edwards bovine bioprosthesis valve is present in the aortic position. Procedure Date: 12/16/2018 Normal aortic valve prosthesis. Mean PG 13 mmHg, which is likely normal. No  regurgitation.  5. The ascending aorta is normal in size and structure.  6. Compared to previous transthoracic study on 08/27/2018, bioprosthetic aortic valve is new.  CTA coronary 08/27/2018: 1. No aortic dissection. 2. Inadequate coaptation of the aortic valve leaflets consistent with severe aortic insufficiency. 3. Coronary artery calcium score 37 Agatston units. This places the patient in the 50th percentile for age and gender, suggesting intermediate risk for future cardiac events. 4. Nonobstructive mild coronary disease. Interpretation of the RCA is somewhat limited by artifact but I think that there is no significant disease.  Cardiovascular studies: EKG 08/27/2018: Sinus rhythm. LBBB (not new)  Echocardiogram 07/14/2018: Left ventricle cavity is normal in size. Moderate concentric hypertrophy of the left ventricle. Normal global wall motion. Unable to evaluate diastolic function due to severity of aortic regurgitation. Calculated EF 55%. Left atrial cavity is mildly dilated. Probably tricuspid valve with central coaptation defect. Severe aortic regurgitation. Mild to moderate mitral regurgitation. Mild tricuspid regurgitation.  No evidence of pulmonary hypertension.  Lexiscan myoview stress test 06/23/2018:  1. Lexiscan stress test was performed. Exercise capacity was notassessed. Stress symptoms included dizziness and headache. Resting blood pressure was 138/62 mmHg and peak effect blood pressure was 162/64 mmHg. The resting and stress electrocardiogram demonstrated normal sinus rhythm, LBBB and no resting arrhythmias. Stress EKG is non diagnostic for ischemia as it is a pharmacologic stress.  2. The overall quality of the study is good. Left ventricular cavity is noted to be enlarged on the rest and stress studies. Gated SPECT images reveal global decrease in myocardial thickening and wall motion. The left ventricular ejection fraction was calculated or visually  estimated to be 38%. SPECT images reveal small sized, fixed, inferior perfusion defect. Findings likely represent dilated nonischemic cardiomyopathy.  3. High risk study.  Recent labs: Results for KAELIN, DEMAN (MRN [id number]) as of 01/15/2019 08:59  Ref. Range 01/07/2019 [id number]  BASIC METABOLIC PANEL Unknown Rpt (A)  Sodium Latest Ref Range: 135 - 145 mmol/L 139  Potassium Latest Ref Range: 3.5 - 5.1 mmol/L  4.1  Chloride Latest Ref Range: 98 - 111 mmol/L 103  CO2 Latest Ref Range: 22 - 32 mmol/L 25  Glucose Latest Ref Range: 70 - 99 mg/dL 108 (H)  BUN Latest Ref Range: 8 - 23 mg/dL 9  Creatinine Latest Ref Range: 0.61 - 1.24 mg/dL 1.37 (H)  Calcium Latest Ref Range: 8.9 - 10.3 mg/dL 8.7 (L)  Anion gap Latest Ref Range: 5 - 15  11  GFR, Est Non African American Latest Ref Range: >60 mL/min 55 (L)  GFR, Est African American Latest Ref Range: >60 mL/min >60   Results for AMAHRI, BONGO (MRN [id number]) as of 01/15/2019 08:59  Ref. Range 01/06/2019 04:36  WBC Latest Ref Range: 4.0 - 10.5 K/uL 7.6  RBC Latest Ref Range: 4.22 - 5.81 MIL/uL 3.21 (L)  Hemoglobin Latest Ref Range: 13.0 - 17.0 g/dL 8.0 (L)  HCT Latest Ref Range: 39.0 - 52.0 % 25.5 (L)  MCV Latest Ref Range: 80.0 - 100.0 fL 79.4 (L)  MCH Latest Ref Range: 26.0 - 34.0 pg 24.9 (L)  MCHC Latest Ref Range: 30.0 - 36.0 g/dL 31.4  RDW Latest Ref Range: 11.5 - 15.5 % 16.7 (H)  Platelets Latest Ref Range: 150 - 400 K/uL 551 (H)  nRBC Latest Ref Range: 0.0 - 0.2 % 0.0    Review of Systems  Constitution: Negative for decreased appetite, malaise/fatigue, weight gain and weight loss.  HENT: Negative for congestion.   Eyes: Negative for visual disturbance.  Cardiovascular: Negative for chest pain, dyspnea on exertion, leg swelling, palpitations and syncope.  Respiratory: Negative for shortness of breath.   Endocrine: Negative for cold intolerance.  Hematologic/Lymphatic: Does not bruise/bleed easily.  Skin: Negative for  itching and rash.  Musculoskeletal: Negative for myalgias.  Gastrointestinal: Negative for abdominal pain, nausea and vomiting.  Genitourinary: Negative for dysuria.  Neurological: Negative for dizziness and weakness.  Psychiatric/Behavioral: The patient is not nervous/anxious.   All other systems reviewed and are negative.       Vitals:   04/17/19 1038  BP: 126/78  Pulse: 86  Temp: (!) 97.1 F (36.2 C)  SpO2: 97%    Physical Exam  Constitutional: He is oriented to person, place, and time. He appears well-developed and well-nourished. No distress.  HENT:  Head: Normocephalic and atraumatic.  Eyes: Pupils are equal, round, and reactive to light. Conjunctivae are normal.  Neck: No JVD present.  Cardiovascular: Normal rate, regular rhythm and intact distal pulses.  No murmur heard. Pulmonary/Chest: Effort normal and breath sounds normal. He has no wheezes. He has no rales.  Well healed scar  Abdominal: Soft. Bowel sounds are normal. There is no rebound.  Musculoskeletal:        General: No edema.  Lymphadenopathy:    He has no cervical adenopathy.  Neurological: He is alert and oriented to person, place, and time. No cranial nerve deficit.  Skin: Skin is warm and dry.  Psychiatric: He has a normal mood and affect.  Nursing note and vitals reviewed.       Assessment & Recommendations:   63 year old African-American male, 25 PY former smoker, OSA, LBBB, h/o asthma, nonobstructive CAD, s/p bioprosthetic AVR (25 mm) through minithoracotomy (12/16/2018 Dr Roxy Manns) for severe AI with quadricuspid aortic valve.   S/p AVR: 25 mm bioprosthetic valve for quadricuspid valve with severe AI Normal functioning on echocardiogram 12/2018. Continue Aspirin 81 mg daily.   Syncope: No recurrence. Likely due to blood loss anemia  Hypertension: Stop losartan 25 mg, metoprolol  tartarate 25 mg bid, start coreg 6.25 mg bid to consolidate therapy.  Anemia: Continue f/u w/PCP.   Follow  up in 6 months   Arlin Savona Esther Hardy, MD [facility] [place] Pager: [phone] Office: [phone] If no answer Cell [phone]

## 2019-04-22 ENCOUNTER — Ambulatory Visit (INDEPENDENT_AMBULATORY_CARE_PROVIDER_SITE_OTHER): Payer: No Typology Code available for payment source | Admitting: Physician Assistant

## 2019-04-22 ENCOUNTER — Other Ambulatory Visit: Payer: Self-pay

## 2019-04-22 ENCOUNTER — Encounter: Payer: Self-pay | Admitting: Orthopaedic Surgery

## 2019-04-22 ENCOUNTER — Ambulatory Visit (INDEPENDENT_AMBULATORY_CARE_PROVIDER_SITE_OTHER): Payer: No Typology Code available for payment source

## 2019-04-22 DIAGNOSIS — Z96642 Presence of left artificial hip joint: Secondary | ICD-10-CM | POA: Diagnosis not present

## 2019-04-22 NOTE — Progress Notes (Signed)
Post-Op Visit Note   Patient: Charles Riggs.           Date of Birth: 02/10/1956           MRN: [id number] Visit Date: 04/22/2019 PCP: Vanetta Mulders, MD   Assessment & Plan:  Chief Complaint:  Chief Complaint  Patient presents with  . Left Hip - Pain   Visit Diagnoses:  1. Status post left hip replacement     Plan: Patient is a pleasant 63 year old gentleman who presents our clinic today 6 weeks status post left anterior total hip replacement, date of surgery 03/09/2019.  He has been doing very well.  He he has no pain.  He does note a small area of numbness to the incisional site.  No fevers or chills.  He has regained near full motion and strength.  He is ambulating without assistance.  Examination of the left hip reveals a fully healed surgical scar.  Full range of motion.  Calf soft nontender.  He is neurovascular intact distally.  At this point, he will continue to increase activity as tolerated.  He will follow-up with [place] in 6 weeks time for repeat evaluation.  If he is continuing to do well at that point, he will not follow-up again with [place] until he is a year out from surgery.  Dental prophylaxis reinforced today.  Follow-Up Instructions: Return in about 6 weeks (around 06/03/2019).   Orders:  Orders Placed This Encounter  Procedures  . XR HIP UNILAT W OR W/O PELVIS 2-3 VIEWS LEFT   No orders of the defined types were placed in this encounter.   Imaging: Xr Hip Unilat W Or W/o Pelvis 2-3 Views Left  Result Date: 04/22/2019 Well-seated prosthesis without complication   PMFS History: Patient Active Problem List   Diagnosis Date Noted  . Status post total replacement of left hip 03/09/2019  . Primary osteoarthritis of left hip 02/10/2019  . Acute respiratory failure with hypoxemia ([place]) 01/04/2019  . Pleural effusion 12/28/2018  . Syncope 12/28/2018  . Acute blood loss anemia   . S/P AVR (aortic valve replacement) 12/16/2018  . Anemia in chronic kidney  disease   . Chronic kidney disease (CKD)   . Coronary artery disease involving native coronary artery of native heart without angina pectoris 09/04/2018  . Elevated troponin 08/29/2018  . Severe aortic insufficiency   . Acute on chronic diastolic heart failure ([place])   . Nonrheumatic aortic valve insufficiency   . Essential hypertension   . Obstructive sleep apnea   . Quadricuspid aortic valve   . Chest pain in adult 08/27/2018  . LEG PAIN 04/26/2010  . DEPRESSION 08/19/2009  . RENAL CALCULUS 01/05/2009  . DM 10/12/2008  . Personal history presenting hazards to health 06/16/2007  . Asthma 03/24/2007  . GERD 03/24/2007   Past Medical History:  Diagnosis Date  . Acute on chronic diastolic heart failure ([place])   . Anemia in chronic kidney disease   . Anxiety   . Aortic insufficiency   . Arthritis   . Asthma   . CHF (congestive heart failure) ([place])   . CKD (chronic kidney disease)   . Dyspnea   . Essential hypertension   . Nerve pain   . Non-rheumatic aortic regurgitation   . Obstructive sleep apnea    cpap  . Quadricuspid aortic valve   . S/P minimally invasive aortic valve replacement with bioprosthetic valve 12/16/2018   25 mm Edwards Inspiris Resilia stented bovine pericardial tissue  valve via right mini thoracotomy approach  . Severe aortic insufficiency   . SOB (shortness of breath)   . Syncope 12/2018    Family History  Problem Relation Age of Onset  . COPD Father   . Cancer Brother     Past Surgical History:  Procedure Laterality Date  . AORTIC VALVE REPLACEMENT N/A 12/16/2018   Procedure: MINIMALLY INVASIVE AORTIC VALVE REPLACEMENT (AVR) using Inspiris Aortic valve 69mm.;  Surgeon: Rexene Alberts, MD;  Location: [place];  Service: Open Heart Surgery;  Laterality: N/A;  . HERNIA REPAIR    . IR THORACENTESIS ASP PLEURAL SPACE W/IMG GUIDE  12/29/2018  . RIB PLATING  12/16/2018   Procedure: Rib Plating;  Surgeon: Rexene Alberts, MD;  Location: [facility] OR;  Service: Open  Heart Surgery;;  . RIGHT/LEFT HEART CATH AND CORONARY ANGIOGRAPHY N/A 10/14/2018   Procedure: RIGHT/LEFT HEART CATH AND CORONARY ANGIOGRAPHY;  Surgeon: Nigel Mormon, MD;  Location: [place] CV LAB;  Service: Cardiovascular;  Laterality: N/A;  . TEE WITHOUT CARDIOVERSION N/A 11/11/2018   Procedure: TRANSESOPHAGEAL ECHOCARDIOGRAM (TEE);  Surgeon: Nigel Mormon, MD;  Location: [facility] ENDOSCOPY;  Service: Cardiovascular;  Laterality: N/A;  . TEE WITHOUT CARDIOVERSION N/A 12/16/2018   Procedure: TRANSESOPHAGEAL ECHOCARDIOGRAM (TEE);  Surgeon: Rexene Alberts, MD;  Location: [place];  Service: Open Heart Surgery;  Laterality: N/A;  . TOTAL HIP ARTHROPLASTY Left 03/09/2019   Procedure: LEFT TOTAL HIP ARTHROPLASTY ANTERIOR APPROACH;  Surgeon: Leandrew Koyanagi, MD;  Location: [place];  Service: Orthopedics;  Laterality: Left;   Social History   Occupational History  . Not on file  Tobacco Use  . Smoking status: Former Smoker    Packs/day: 1.00    Years: 7.00    Pack years: 7.00    Types: Cigarettes    Quit date: 2011    Years since quitting: 9.9  . Smokeless tobacco: Never Used  . Tobacco comment: 30 PY  Substance and Sexual Activity  . Alcohol use: No  . Drug use: No  . Sexual activity: Not on file

## 2019-06-03 ENCOUNTER — Encounter: Payer: Self-pay | Admitting: Orthopaedic Surgery

## 2019-06-03 ENCOUNTER — Ambulatory Visit: Payer: Self-pay

## 2019-06-03 ENCOUNTER — Other Ambulatory Visit: Payer: Self-pay

## 2019-06-03 ENCOUNTER — Ambulatory Visit (INDEPENDENT_AMBULATORY_CARE_PROVIDER_SITE_OTHER): Payer: No Typology Code available for payment source | Admitting: Orthopaedic Surgery

## 2019-06-03 VITALS — Ht 71.0 in | Wt 240.0 lb

## 2019-06-03 DIAGNOSIS — Z96642 Presence of left artificial hip joint: Secondary | ICD-10-CM

## 2019-06-03 DIAGNOSIS — M25551 Pain in right hip: Secondary | ICD-10-CM

## 2019-06-03 NOTE — Progress Notes (Signed)
Office Visit Note   Patient: Charles Riggs.           Date of Birth: July 18, 1955           MRN: [id number] Visit Date: 06/03/2019              Requested by: Vanetta Mulders, [address] [place] [address] [place] [address] PCP: Vanetta Mulders, MD   Assessment & Plan: Visit Diagnoses:  1. Pain in right hip   2. Status post total hip replacement, left     Plan: Impression is #1 status post left total hip replacement.  #2 right hip degenerative joint disease.  #3 spinal stenosis.  Regards to the left hip, he is doing well.  Dental prophylaxis reinforced.  Follow-up with [place] in 9 months time when he is 1 year out from surgery with AP pelvis lateral hip x-rays.  In regards to his right hip, it sounds like majority of symptoms are actually coming from his back although he does endorse some pain to the groin.  He notes that he will likely undergo an outpatient lumbar surgery in the near future.  We will set him up for diagnostic him hopefully therapeutic cortisone injection to the right hip with Dr. Junius Roads today.  He will follow-up with [place] when he is ready proceed with right total hip replacement.  Follow-Up Instructions: Return in about 9 months (around 03/02/2020).   Orders:  Orders Placed This Encounter  Procedures  . XR HIP UNILAT W OR W/O PELVIS 2-3 VIEWS RIGHT  . US Guided Needle Placement - No Linked Charges   No orders of the defined types were placed in this encounter.     Procedures: No procedures performed   Clinical Data: No additional findings.   Subjective: Chief Complaint  Patient presents with  . Left Hip - Follow-up    03/09/2019 Left THA  . Right Hip - Pain    HPI patient is a pleasant 64 year old gentleman who comes in today 3 months out left total hip replacement 03/09/2019.  He has been doing very well regards to the left hip.  He is now complaining of right hip pain.  History of DJD to the right hip.  The pain he has is actually to the right  buttocks which occasionally radiates into the groin.  He does note pain that radiates down his entire leg and into his foot.  Pain is aggravated when sitting or standing even walking for long period of time.  He gets mild relief when leaning forward.  He has tried over-the-counter medications without relief of symptoms.  He does note some numbness to the toes on the right foot.  Of note, he has recently been worked up by the [place] for spinal stenosis.  He was given a option for [facility] versus surgical intervention.   Review of Systems as detailed in HPI.  All others reviewed and are negative.   Objective: Vital Signs: Ht 5\' 11"  (1.803 m)   Wt 240 lb (108.9 kg)   BMI 33.47 kg/m   Physical Exam well-developed well-nourished gentleman in no acute distress.  Alert and oriented x3.  Ortho Exam examination of left hip reveals negative logroll.  Calf soft nontender.  Right hip shows a negative logroll.  Very minimally positive FADIR.  Positive straight leg raise.  Is right-sided.  Spinous tenderness.  No spinous tenderness.  No focal weakness.  Is neurovascular intact distally.  Specialty Comments:  No specialty  comments available.  Imaging: No new imaging   PMFS History: Patient Active Problem List   Diagnosis Date Noted  . Status post total replacement of left hip 03/09/2019  . Primary osteoarthritis of left hip 02/10/2019  . Acute respiratory failure with hypoxemia ([place]) 01/04/2019  . Pleural effusion 12/28/2018  . Syncope 12/28/2018  . Acute blood loss anemia   . S/P AVR (aortic valve replacement) 12/16/2018  . Anemia in chronic kidney disease   . Chronic kidney disease (CKD)   . Coronary artery disease involving native coronary artery of native heart without angina pectoris 09/04/2018  . Elevated troponin 08/29/2018  . Severe aortic insufficiency   . Acute on chronic diastolic heart failure ([place])   . Nonrheumatic aortic valve insufficiency   . Essential hypertension   . Obstructive  sleep apnea   . Quadricuspid aortic valve   . Chest pain in adult 08/27/2018  . LEG PAIN 04/26/2010  . DEPRESSION 08/19/2009  . RENAL CALCULUS 01/05/2009  . DM 10/12/2008  . Personal history presenting hazards to health 06/16/2007  . Asthma 03/24/2007  . GERD 03/24/2007   Past Medical History:  Diagnosis Date  . Acute on chronic diastolic heart failure ([place])   . Anemia in chronic kidney disease   . Anxiety   . Aortic insufficiency   . Arthritis   . Asthma   . CHF (congestive heart failure) ([place])   . CKD (chronic kidney disease)   . Dyspnea   . Essential hypertension   . Nerve pain   . Non-rheumatic aortic regurgitation   . Obstructive sleep apnea    cpap  . Quadricuspid aortic valve   . S/P minimally invasive aortic valve replacement with bioprosthetic valve 12/16/2018   25 mm Edwards Inspiris Resilia stented bovine pericardial tissue valve via right mini thoracotomy approach  . Severe aortic insufficiency   . SOB (shortness of breath)   . Syncope 12/2018    Family History  Problem Relation Age of Onset  . COPD Father   . Cancer Brother     Past Surgical History:  Procedure Laterality Date  . AORTIC VALVE REPLACEMENT N/A 12/16/2018   Procedure: MINIMALLY INVASIVE AORTIC VALVE REPLACEMENT (AVR) using Inspiris Aortic valve 98mm.;  Surgeon: Rexene Alberts, MD;  Location: [facility];  Service: Open Heart Surgery;  Laterality: N/A;  . HERNIA REPAIR    . IR THORACENTESIS ASP PLEURAL SPACE W/IMG GUIDE  12/29/2018  . RIB PLATING  12/16/2018   Procedure: Rib Plating;  Surgeon: Rexene Alberts, MD;  Location: [facility] OR;  Service: Open Heart Surgery;;  . RIGHT/LEFT HEART CATH AND CORONARY ANGIOGRAPHY N/A 10/14/2018   Procedure: RIGHT/LEFT HEART CATH AND CORONARY ANGIOGRAPHY;  Surgeon: Nigel Mormon, MD;  Location: [facility] CV LAB;  Service: Cardiovascular;  Laterality: N/A;  . TEE WITHOUT CARDIOVERSION N/A 11/11/2018   Procedure: TRANSESOPHAGEAL ECHOCARDIOGRAM (TEE);  Surgeon:  Nigel Mormon, MD;  Location: [facility] ENDOSCOPY;  Service: Cardiovascular;  Laterality: N/A;  . TEE WITHOUT CARDIOVERSION N/A 12/16/2018   Procedure: TRANSESOPHAGEAL ECHOCARDIOGRAM (TEE);  Surgeon: Rexene Alberts, MD;  Location: [facility];  Service: Open Heart Surgery;  Laterality: N/A;  . TOTAL HIP ARTHROPLASTY Left 03/09/2019   Procedure: LEFT TOTAL HIP ARTHROPLASTY ANTERIOR APPROACH;  Surgeon: Leandrew Koyanagi, MD;  Location: [facility];  Service: Orthopedics;  Laterality: Left;   Social History   Occupational History  . Not on file  Tobacco Use  . Smoking status: Former Smoker    Packs/day: 1.00  Years: 7.00    Pack years: 7.00    Types: Cigarettes    Quit date: 2011    Years since quitting: 10.0  . Smokeless tobacco: Never Used  . Tobacco comment: 30 PY  Substance and Sexual Activity  . Alcohol use: No  . Drug use: No  . Sexual activity: Not on file

## 2019-06-03 NOTE — Progress Notes (Signed)
Subjective: Patient is here for ultrasound-guided intra-articular right hip injection.   This is a diagnostic/therapeutic injection.  He had left hip replacement and did well from that standpoint.  He also has spinal stenosis.  He is getting ready to have a lumbar epidural injection at the [facility].  Objective: Right hip has good range of motion.  He feels tightness with internal rotation but not severe pain.  Procedure: Ultrasound-guided right hip injection: After sterile prep with Betadine, injected 8 cc 1% lidocaine without epinephrine and 40 mg methylprednisolone using a 22-gauge spinal needle, passing the needle through the iliofemoral ligament into the femoral head/neck junction.  Injectate was seen filling the joint capsule.  He actually had very good immediate relief including his posterior pain.

## 2019-08-24 ENCOUNTER — Other Ambulatory Visit: Payer: Self-pay

## 2019-08-24 MED ORDER — METOPROLOL TARTRATE 25 MG PO TABS: 25.0000 mg | ORAL_TABLET | Freq: Two times a day (BID) | ORAL | 3 refills | Status: DC

## 2019-10-12 ENCOUNTER — Encounter: Payer: Self-pay | Admitting: Neurology

## 2019-10-14 NOTE — Progress Notes (Deleted)
Subjective:   Charles Setters., male    DOB: [birth year], 64 y.o.   MRN: [id number]   Chief complaint:  Aortic regurgitation  HPI  64 y.o. African-American male, 72 PY former smoker, OSA, LBBB, h/o asthma, nonobstructive CAD, s/p bioprosthetic AVR (25 mm) through minithoracotomy (12/16/2018 Dr Roxy Manns) for severe AI with quadricuspid aortic valve.   ***At last visit in 04/17/2019, I suspected her syncope may have been related to blood loss.  I continued Aspirin 81 mg daily. ***  I started coreg 6.25 mg bid additionaly to her regular blood pressure medication.  ***  ***Previous OV on 11/202/2020: Patient had two syncopal events since his surgery, attributed to hypotension and bloody pleural effusions, treated with thoracentesis and IV iron. He has been doing well. Chest pain, shortness of breath has improved. BP is well controlled. He would like to come off some meds.   Current Outpatient Medications on File Prior to Visit  Medication Sig Dispense Refill  . albuterol (PROVENTIL HFA;VENTOLIN HFA) 108 (90 Base) MCG/ACT inhaler Inhale 2 puffs into the lungs every 6 (six) hours as needed for wheezing or shortness of breath.    Marland Kitchen albuterol (PROVENTIL) (2.5 MG/3ML) 0.083% nebulizer solution Take 3 mLs (2.5 mg total) by nebulization every 6 (six) hours as needed for wheezing or shortness of breath. 75 mL 12  . amLODipine (NORVASC) 10 MG tablet Take 10 mg by mouth daily.    Marland Kitchen aspirin EC 81 MG tablet Take 1 tablet (81 mg total) by mouth daily. 90 tablet 1  . bisacodyl (DULCOLAX) 5 MG EC tablet Take 1 tablet (5 mg total) by mouth daily as needed for moderate constipation. 30 tablet 1  . budesonide-formoterol (SYMBICORT) 160-4.5 MCG/ACT inhaler Inhale 2 puffs into the lungs 2 (two) times daily. 1 Inhaler 1  . carvedilol (COREG) 6.25 MG tablet Take 1 tablet (6.25 mg total) by mouth 2 (two) times daily. 120 tablet 3  . Cholecalciferol (VITAMIN D3) 50 MCG (2000 UT) TABS Take 2,000 Units by mouth  2 (two) times daily.    Marland Kitchen dextromethorphan-guaiFENesin (MUCINEX DM) 30-600 MG 12hr tablet Take 1 tablet by mouth 2 (two) times daily.    . fluticasone (FLONASE) 50 MCG/ACT nasal spray Place 2 sprays into both nostrils daily as needed for allergies or rhinitis.    . furosemide (LASIX) 40 MG tablet Take 1 tablet (40 mg total) by mouth daily for 5 days. 30 tablet 0  . hydrOXYzine (VISTARIL) 25 MG capsule Take 25 mg by mouth 3 (three) times daily.    . methocarbamol (ROBAXIN) 750 MG tablet Take 1 tablet (750 mg total) by mouth 2 (two) times daily as needed for muscle spasms. 60 tablet 0  . metoprolol tartrate (LOPRESSOR) 25 MG tablet Take 1 tablet (25 mg total) by mouth 2 (two) times daily. 180 tablet 3  . montelukast (SINGULAIR) 10 MG tablet Take 10 mg by mouth daily.     . rosuvastatin (CRESTOR) 10 MG tablet Take 1 tablet (10 mg total) by mouth daily. 90 tablet 1   No current facility-administered medications on file prior to visit.    Cardiovascular studies:  ***  EKG ***/***/202***: ***  Echocardiogram 12/29/2018:  1. The left ventricle has mildly reduced systolic function, with an ejection fraction of 45-50%. The cavity size was normal. Left ventricular diastolic parameters were normal. There is abnormal septal motion consistent with post-operative status.  2. The right ventricle has normal systolc function. There is no increase in right  ventricular wall thickness.  3. Trivial circumferential pericardial effusion is present.  4. A 48m an Edwards bovine bioprosthesis valve is present in the aortic position. Procedure Date: 12/16/2018 Normal aortic valve prosthesis. Mean PG 13 mmHg, which is likely normal. No regurgitation.  5. The ascending aorta is normal in size and structure.  6. Compared to previous transthoracic study on 08/27/2018, bioprosthetic aortic valve is new.  Lower Venous [place]08/01/2019: Right & Left: No DVT. No cystic structure found in the popliteal fossa.   CTA  coronary 08/27/2018: 1. No aortic dissection. 2. Inadequate coaptation of the aortic valve leaflets consistent with severe aortic insufficiency. 3. Coronary artery calcium score 37 Agatston units. This places the patient in the 50th percentile for age and gender, suggesting intermediate risk for future cardiac events. 4. Nonobstructive mild coronary disease. Interpretation of the RCA is somewhat limited by artifact but I think that there is no significant disease.  EKG 08/27/2018: Sinus rhythm. LBBB (not new)  Echocardiogram 07/14/2018: Left ventricle cavity is normal in size. Moderate concentric hypertrophy of the left ventricle. Normal global wall motion. Unable to evaluate diastolic function due to severity of aortic regurgitation. Calculated EF 55%. Left atrial cavity is mildly dilated. Probably tricuspid valve with central coaptation defect. Severe aortic regurgitation. Mild to moderate mitral regurgitation. Mild tricuspid regurgitation.  No evidence of pulmonary hypertension.  Lexiscan myoview stress test 06/23/2018:  1. Lexiscan stress test was performed. Exercise capacity was notassessed. Stress symptoms included dizziness and headache. Resting blood pressure was 138/62 mmHg and peak effect blood pressure was 162/64 mmHg. The resting and stress electrocardiogram demonstrated normal sinus rhythm, LBBB and no resting arrhythmias. Stress EKG is non diagnostic for ischemia as it is a pharmacologic stress.  2. The overall quality of the study is good. Left ventricular cavity is noted to be enlarged on the rest and stress studies. Gated SPECT images reveal global decrease in myocardial thickening and wall motion. The left ventricular ejection fraction was calculated or visually estimated to be 38%. SPECT images reveal small sized, fixed, inferior perfusion defect. Findings likely represent dilated nonischemic cardiomyopathy.  3. High risk study.  *** Recent  labs: 03/10/2019: Glucose 155, BUN/Cr 15/1.47. EGFR 59. Na/K 137/4.9. Calcium 8.7. Rest of the CMP normal H/H 9.1/30. MCV 80. Platelets 226  12/11/2018: HbA1C 5.5% Vitamin D 15.31 Low  08/28/2018: Chol 225, TG 90, HDL 78, LDL 129   Review of Systems  Cardiovascular: Negative for chest pain, dyspnea on exertion, leg swelling, palpitations and syncope.         There were no vitals filed for this visit.  There is no height or weight on file to calculate BMI.  There were no vitals filed for this visit.   Objective:   Physical Exam  Constitutional: No distress.  Neck: No JVD present.  Cardiovascular: Normal rate, regular rhythm, normal heart sounds and intact distal pulses.  No murmur heard. Pulmonary/Chest: Effort normal and breath sounds normal. He has no wheezes. He has no rales.  Musculoskeletal:        General: No edema.  Nursing note and vitals reviewed.      Assessment & Recommendations:   64year old African-American male, 363PY former smoker, OSA, LBBB, h/o asthma, nonobstructive CAD, s/p bioprosthetic AVR (25 mm) through minithoracotomy (12/16/2018 Dr ORoxy Manns for severe AI with quadricuspid aortic valve.   ***   Follow up in ***    ***Prev OV: S/p AVR: 25 mm bioprosthetic valve for quadricuspid valve with severe AI. Normal functioning  on echocardiogram 12/2018. Continue Aspirin 81 mg daily.   Syncope: No recurrence. Likely due to blood loss anemia  Hypertension: Stop losartan 25 mg, metoprolol tartarate 25 mg bid, start coreg 6.25 mg bid to consolidate therapy.  Anemia: Continue f/u w/PCP.   Follow up in 6 months ***  [place]ley, MD [facility] Pager: [phone] Office: [phone] If no answer Cell [phone]

## 2019-10-15 ENCOUNTER — Ambulatory Visit: Payer: No Typology Code available for payment source | Admitting: Cardiology

## 2019-10-15 ENCOUNTER — Encounter: Payer: Self-pay | Admitting: Cardiology

## 2019-10-15 ENCOUNTER — Other Ambulatory Visit: Payer: Self-pay

## 2019-10-15 VITALS — BP 106/71 | HR 66 | Ht 71.0 in | Wt 261.5 lb

## 2019-10-15 DIAGNOSIS — D649 Anemia, unspecified: Secondary | ICD-10-CM

## 2019-10-15 DIAGNOSIS — I10 Essential (primary) hypertension: Secondary | ICD-10-CM

## 2019-10-15 DIAGNOSIS — Z952 Presence of prosthetic heart valve: Secondary | ICD-10-CM

## 2019-10-15 DIAGNOSIS — E785 Hyperlipidemia, unspecified: Secondary | ICD-10-CM

## 2019-10-15 NOTE — Progress Notes (Signed)
Subjective:   Charles Riggs., male    DOB: 1955-06-04, 64 y.o.   MRN: [id number]   Chief complaint:  Aortic regurgitation  HPI  64 year old African-American male, 22 PY former smoker, OSA, LBBB, h/o asthma, nonobstructive CAD, s/p bioprosthetic AVR (25 mm) through minithoracotomy (12/16/2018 Dr Roxy Manns) for severe AI with quadricuspid aortic valve.   Review of recent labs show Hb still low at 9 g/dl. He has experienced unchanged shortness of breath on exertion, but denies leg edema, orthopnea, PND. He sees PCP at [place] in [place]. It does not appear that he has had any workup or management for his anemia.  Medication review suggests that he is on both carvedilol and metoprolol.   Current Outpatient Medications on File Prior to Visit  Medication Sig Dispense Refill  . albuterol (PROVENTIL HFA;VENTOLIN HFA) 108 (90 Base) MCG/ACT inhaler Inhale 2 puffs into the lungs every 6 (six) hours as needed for wheezing or shortness of breath.    Marland Kitchen albuterol (PROVENTIL) (2.5 MG/3ML) 0.083% nebulizer solution Take 3 mLs (2.5 mg total) by nebulization every 6 (six) hours as needed for wheezing or shortness of breath. 75 mL 12  . amLODipine (NORVASC) 10 MG tablet Take 10 mg by mouth daily.    Marland Kitchen aspirin EC 81 MG tablet Take 1 tablet (81 mg total) by mouth daily. 90 tablet 1  . bisacodyl (DULCOLAX) 5 MG EC tablet Take 1 tablet (5 mg total) by mouth daily as needed for moderate constipation. 30 tablet 1  . budesonide-formoterol (SYMBICORT) 160-4.5 MCG/ACT inhaler Inhale 2 puffs into the lungs 2 (two) times daily. 1 Inhaler 1  . carvedilol (COREG) 6.25 MG tablet Take 1 tablet (6.25 mg total) by mouth 2 (two) times daily. 120 tablet 3  . dextromethorphan-guaiFENesin (MUCINEX DM) 30-600 MG 12hr tablet Take 1 tablet by mouth 2 (two) times daily.    . fluticasone (FLONASE) 50 MCG/ACT nasal spray Place 2 sprays into both nostrils daily as needed for allergies or rhinitis.    . hydrOXYzine (VISTARIL) 25  MG capsule Take 25 mg by mouth 3 (three) times daily.    . methocarbamol (ROBAXIN) 750 MG tablet Take 1 tablet (750 mg total) by mouth 2 (two) times daily as needed for muscle spasms. 60 tablet 0  . metoprolol tartrate (LOPRESSOR) 25 MG tablet Take 1 tablet (25 mg total) by mouth 2 (two) times daily. 180 tablet 3  . montelukast (SINGULAIR) 10 MG tablet Take 10 mg by mouth daily.     . rosuvastatin (CRESTOR) 10 MG tablet Take 1 tablet (10 mg total) by mouth daily. 90 tablet 1  . Cholecalciferol (VITAMIN D3) 50 MCG (2000 UT) TABS Take 2,000 Units by mouth 2 (two) times daily.     No current facility-administered medications on file prior to visit.    Cardiovascular studies:  EKG 10/15/2019: Sinus rhythm 64 bpm IVCD. Poor R-wave progression Cannot exclude old anteroseptal infarct  Echocardiogram 12/29/2018:  1. The left ventricle has mildly reduced systolic function, with an ejection fraction of 45-50%. The cavity size was normal. Left ventricular diastolic parameters were normal. There is abnormal septal motion consistent with post-operative status.  2. The right ventricle has normal systolc function. There is no increase in right ventricular wall thickness.  3. Trivial circumferential pericardial effusion is present.  4. A 55m an Edwards bovine bioprosthesis valve is present in the aortic position. Procedure Date: 12/16/2018 Normal aortic valve prosthesis. Mean PG 13 mmHg, which is likely normal. No regurgitation.  5. The ascending aorta is normal in size and structure.  6. Compared to previous transthoracic study on 08/27/2018, bioprosthetic aortic valve is new.  Coronary angiogram 10/14/2018: LM: Normal LAD: Normal Ramus: Normal LCx: Normal RCA: Normal  Angiographically normal coronary arteries with no significant CAD Mild pulmonary hypertnension mean PA 26 mmHg  CTA coronary 08/27/2018: 1. No aortic dissection. 2. Inadequate coaptation of the aortic valve leaflets  consistent with severe aortic insufficiency. 3. Coronary artery calcium score 37 Agatston units. This places the patient in the 50th percentile for age and gender, suggesting intermediate risk for future cardiac events. 4. Nonobstructive mild coronary disease. Interpretation of the RCA is somewhat limited by artifact but I think that there is no significant disease.   Recent labs: 02/2019: Glucose 155. BUN/Cr 15/1.47. eGFR 51 H/H 9.1/30.1, MCV 80. Platelets 226 HbA1C 5.5%  08/2018: Chol 225, TG 90, HDL 78, LDL 129   Review of Systems  Cardiovascular: Negative for chest pain, dyspnea on exertion, leg swelling, palpitations and syncope.        Vitals:   10/15/19 1003  BP: 106/71  Pulse: 66  SpO2: 96%    Physical Exam  Constitutional: No distress.  Neck: No JVD present.  Cardiovascular: Normal rate, regular rhythm, normal heart sounds and intact distal pulses.  No murmur heard. Pulmonary/Chest: Effort normal and breath sounds normal. He has no wheezes. He has no rales.  Musculoskeletal:        General: No edema.  Nursing note and vitals reviewed.       Assessment & Recommendations:   64 year old African-American male, 10 PY former smoker, OSA, LBBB, h/o asthma, nonobstructive CAD, s/p bioprosthetic AVR (25 mm) through minithoracotomy (12/16/2018 Dr Roxy Manns) for severe AI with quadricuspid aortic valve.   S/p AVR: 25 mm bioprosthetic valve for quadricuspid valve with severe AI Normal functioning on echocardiogram 12/2018. Continue Aspirin 81 mg daily.   Hypertension: Stop metoprolol. Continue coreg and amlodipine. Currently on coreg 6.25 mg bid  Anemia: Likely cause of his exertional dyspnea. Needs workup and management. Defer to PCP.  Hyperlipidemia, elevated calcium score: Continue crestor 10 mg.  F/u in 1 year.  Nigel Mormon, MD [facility] Pager: [phone] Office: [phone] If no answer Cell [phone]

## 2019-10-21 ENCOUNTER — Encounter: Payer: Self-pay | Admitting: Orthopaedic Surgery

## 2019-10-21 ENCOUNTER — Other Ambulatory Visit: Payer: Self-pay

## 2019-10-21 ENCOUNTER — Ambulatory Visit (INDEPENDENT_AMBULATORY_CARE_PROVIDER_SITE_OTHER): Payer: No Typology Code available for payment source | Admitting: Orthopaedic Surgery

## 2019-10-21 ENCOUNTER — Ambulatory Visit (INDEPENDENT_AMBULATORY_CARE_PROVIDER_SITE_OTHER): Payer: No Typology Code available for payment source

## 2019-10-21 VITALS — Ht 70.5 in | Wt 261.0 lb

## 2019-10-21 DIAGNOSIS — M1611 Unilateral primary osteoarthritis, right hip: Secondary | ICD-10-CM | POA: Insufficient documentation

## 2019-10-21 DIAGNOSIS — Z96642 Presence of left artificial hip joint: Secondary | ICD-10-CM | POA: Diagnosis not present

## 2019-10-21 NOTE — Progress Notes (Signed)
Office Visit Note   Patient: Charles Riggs.           Date of Birth: May 27, 1956           MRN: [id number] Visit Date: 10/21/2019              Requested by: Vanetta Mulders, [place] [place] [address] PCP: Vanetta Mulders, MD   Assessment & Plan: Visit Diagnoses:  1. Primary osteoarthritis of right hip   2. Status post total replacement of left hip     Plan: Impression is 71-month status post left total hip replacement doing well.  Right hip moderate DJD.  We had a long discussion about his wishes for the right hip and he would like to look into having the right hip replaced early next year.  We will see him back in about 6 months for his 1 year follow-up for his left hip anyways so that at that time we can schedule his right hip replacement.  We will need to obtain preoperative cardiac clearance again from Dr. Virgina Jock.  Follow-Up Instructions: Return in about 6 months (around 04/22/2020).   Orders:  Orders Placed This Encounter  Procedures  . XR HIP UNILAT W OR W/O PELVIS 2-3 VIEWS RIGHT   No orders of the defined types were placed in this encounter.     Procedures: No procedures performed   Clinical Data: No additional findings.   Subjective: Chief Complaint  Patient presents with  . Right Hip - Pain    Charles Riggs is a 64 year old gentleman who is here for follow-up of both of his hips.  He is 18-month status post left total hip placement and doing well.  He notices some numbness around the scar and easy fatigability of the left leg.  He takes Tylenol for right hip pain due to his DJD.  He is currently very busy with work and so he would like to postpone right total hip replacement until early next year.  He has no real complaints otherwise.   Review of Systems  Constitutional: Negative.   All other systems reviewed and are negative.    Objective: Vital Signs: Ht 5' 10.5" (1.791 m)   Wt 261 lb (118.4 kg)   BMI 36.92 kg/m    Physical Exam Vitals and nursing note reviewed.  Constitutional:      Appearance: He is well-developed.  Pulmonary:     Effort: Pulmonary effort is normal.  Abdominal:     Palpations: Abdomen is soft.  Skin:    General: Skin is warm.  Neurological:     Mental Status: He is alert and oriented to person, place, and time.  Psychiatric:        Behavior: Behavior normal.        Thought Content: Thought content normal.        Judgment: Judgment normal.     Ortho Exam Left hip exam is benign.  Fully healed surgical scar. Right hip shows pain with internal and external rotation positive Stinchfield sign. Specialty Comments:  No specialty comments available.  Imaging: XR HIP UNILAT W OR W/O PELVIS 2-3 VIEWS RIGHT  Result Date: 10/21/2019 Stable left total hip replacement.  Moderate right hip DJD.    PMFS History: Patient Active Problem List   Diagnosis Date Noted  . Primary osteoarthritis of right hip 10/21/2019  . Anemia 10/15/2019  . Hyperlipidemia 10/15/2019  . Status post total replacement of left hip 03/09/2019  .  Primary osteoarthritis of left hip 02/10/2019  . Acute respiratory failure with hypoxemia ([place]) 01/04/2019  . Pleural effusion 12/28/2018  . Syncope 12/28/2018  . Acute blood loss anemia   . S/P AVR (aortic valve replacement) 12/16/2018  . Anemia in chronic kidney disease   . Chronic kidney disease (CKD)   . Coronary artery disease involving native coronary artery of native heart without angina pectoris 09/04/2018  . Elevated troponin 08/29/2018  . Severe aortic insufficiency   . Acute on chronic diastolic heart failure ([facility])   . Nonrheumatic aortic valve insufficiency   . Essential hypertension   . Obstructive sleep apnea   . Quadricuspid aortic valve   . Chest pain in adult 08/27/2018  . LEG PAIN 04/26/2010  . DEPRESSION 08/19/2009  . RENAL CALCULUS 01/05/2009  . DM 10/12/2008  . Personal history presenting hazards to health 06/16/2007  .  Asthma 03/24/2007  . GERD 03/24/2007   Past Medical History:  Diagnosis Date  . Acute on chronic diastolic heart failure ([facility])   . Anemia in chronic kidney disease   . Anxiety   . Aortic insufficiency   . Arthritis   . Asthma   . CHF (congestive heart failure) ([facility])   . CKD (chronic kidney disease)   . Dyspnea   . Essential hypertension   . Nerve pain   . Non-rheumatic aortic regurgitation   . Obstructive sleep apnea    cpap  . Quadricuspid aortic valve   . S/P minimally invasive aortic valve replacement with bioprosthetic valve 12/16/2018   25 mm Edwards Inspiris Resilia stented bovine pericardial tissue valve via right mini thoracotomy approach  . Severe aortic insufficiency   . SOB (shortness of breath)   . Syncope 12/2018    Family History  Problem Relation Age of Onset  . COPD Father   . Cancer Brother     Past Surgical History:  Procedure Laterality Date  . AORTIC VALVE REPLACEMENT N/A 12/16/2018   Procedure: MINIMALLY INVASIVE AORTIC VALVE REPLACEMENT (AVR) using Inspiris Aortic valve 73mm.;  Surgeon: Rexene Alberts, MD;  Location: [facility];  Service: Open Heart Surgery;  Laterality: N/A;  . BACK SURGERY    . HERNIA REPAIR    . IR THORACENTESIS ASP PLEURAL SPACE W/IMG GUIDE  12/29/2018  . RIB PLATING  12/16/2018   Procedure: Rib Plating;  Surgeon: Rexene Alberts, MD;  [facility];  Service: Open Heart Surgery;;  . RIGHT/LEFT HEART CATH AND CORONARY ANGIOGRAPHY N/A 10/14/2018   Procedure: RIGHT/LEFT HEART CATH AND CORONARY ANGIOGRAPHY;  Surgeon: Nigel Mormon, MD;  Location: [facility];  Service: Cardiovascular;  Laterality: N/A;  . TEE WITHOUT CARDIOVERSION N/A 11/11/2018   Procedure: TRANSESOPHAGEAL ECHOCARDIOGRAM (TEE);  Surgeon: Nigel Mormon, MD;  Location: [facility] ENDOSCOPY;  Service: Cardiovascular;  Laterality: N/A;  . TEE WITHOUT CARDIOVERSION N/A 12/16/2018   Procedure: TRANSESOPHAGEAL ECHOCARDIOGRAM (TEE);  Surgeon: Rexene Alberts, MD;   Location: [facility];  Service: Open Heart Surgery;  Laterality: N/A;  . TOTAL HIP ARTHROPLASTY Left 03/09/2019   Procedure: LEFT TOTAL HIP ARTHROPLASTY ANTERIOR APPROACH;  Surgeon: Leandrew Koyanagi, MD;  Location: [facility];  Service: Orthopedics;  Laterality: Left;   Social History   Occupational History  . Not on file  Tobacco Use  . Smoking status: Former Smoker    Packs/day: 1.00    Years: 7.00    Pack years: 7.00    Types: Cigarettes    Quit date: 2011    Years since  quitting: 10.4  . Smokeless tobacco: Never Used  . Tobacco comment: 30 PY  Substance and Sexual Activity  . Alcohol use: No  . Drug use: No  . Sexual activity: Not on file

## 2019-10-27 ENCOUNTER — Telehealth: Payer: Self-pay | Admitting: Orthopaedic Surgery

## 2019-10-27 NOTE — Telephone Encounter (Signed)
faxed

## 2019-10-27 NOTE — Telephone Encounter (Signed)
Received call from Victoria with [facility] needing office note faxed to her for DOS 10/21/2019    The fax# is [phone]   The ph# is [phone]

## 2019-11-11 ENCOUNTER — Ambulatory Visit: Payer: No Typology Code available for payment source | Admitting: Cardiology

## 2019-11-23 ENCOUNTER — Telehealth: Payer: Self-pay | Admitting: Orthopaedic Surgery

## 2019-11-23 NOTE — Telephone Encounter (Signed)
Patient states he would like to wait til 05/28/20 to go ahead with the surgery, and he was told by the [facility] that since his authorization expires 04/13/20 (rt hip) that hour office would need to submit an extension of care to the [place] before or on the expiration date to be able to do the surgery in January 2022.

## 2019-11-23 NOTE — Telephone Encounter (Signed)
Ok can we do that please.  Thanks.

## 2019-11-26 NOTE — Telephone Encounter (Signed)
Called to explain per sherrie our su scheduler he needs to go back [place] and get authorization.

## 2019-12-09 ENCOUNTER — Other Ambulatory Visit: Payer: Self-pay | Admitting: Cardiology

## 2019-12-09 DIAGNOSIS — I10 Essential (primary) hypertension: Secondary | ICD-10-CM

## 2019-12-14 ENCOUNTER — Encounter: Payer: Self-pay | Admitting: Thoracic Surgery (Cardiothoracic Vascular Surgery)

## 2019-12-14 ENCOUNTER — Telehealth (INDEPENDENT_AMBULATORY_CARE_PROVIDER_SITE_OTHER): Payer: No Typology Code available for payment source | Admitting: Thoracic Surgery (Cardiothoracic Vascular Surgery)

## 2019-12-14 ENCOUNTER — Other Ambulatory Visit: Payer: Self-pay

## 2019-12-14 DIAGNOSIS — Z952 Presence of prosthetic heart valve: Secondary | ICD-10-CM

## 2019-12-14 NOTE — Progress Notes (Signed)
[address]       [place] [address]             [phone]     CARDIOTHORACIC SURGERY TELEPHONE VIRTUAL OFFICE NOTE  Referring Provider is [name], Reynold Bowen, MD Primary Cardiologist is No primary care provider on file. PCP is Vanetta Mulders, MD   HPI:  I spoke with Charles Riggs. (DOB 12-17-1955 ) via telephone on 12/14/2019 at 10:50 AM and verified that I was speaking with the correct person using more than one form of identification.  We discussed the reason(s) for conducting our visit virtually instead of in-person.  The patient expressed understanding the circumstances and agreed to proceed as described.   Patient is a 64 year old African-American male with history ofquadricuspid aortic valve andsevere aortic insufficiency, hypertension, left bundle branch block, asthma, obstructive sleep apnea, abnormal stress test and remote history of tobacco use whounderwent minimally invasive aortic valve replacement using a stented bovine pericardial tissue valve on December 16, 2018 via right minithoracotomy approach.    Routine follow-up echocardiogram performed August 2020 revealed normal functioning bioprosthetic tissue valve in aortic position with no aortic insufficiency and mean transvalvular gradient estimated 13 mmHg.  There was mildly reduced left ventricular systolic function with ejection fraction estimated 45 to 50%.  He was last seen here in our office on March 23, 2019 at which time he was recovering from total hip replacement surgery.  I spoke with the patient over the telephone today to check and see how he is doing.  He states that overall he is doing very well.  He is planned to have his other hip replaced later this year.  He exercises on a regular basis and he states that he has no physical limitations.  He only gets short of breath with more strenuous physical exertion.  Overall he is quite pleased with his outcome.   Current Outpatient  Medications  Medication Sig Dispense Refill  . albuterol (PROVENTIL HFA;VENTOLIN HFA) 108 (90 Base) MCG/ACT inhaler Inhale 2 puffs into the lungs every 6 (six) hours as needed for wheezing or shortness of breath.    Marland Kitchen albuterol (PROVENTIL) (2.5 MG/3ML) 0.083% nebulizer solution Take 3 mLs (2.5 mg total) by nebulization every 6 (six) hours as needed for wheezing or shortness of breath. 75 mL 12  . amLODipine (NORVASC) 10 MG tablet Take 10 mg by mouth daily.    Marland Kitchen aspirin EC 81 MG tablet Take 1 tablet (81 mg total) by mouth daily. 90 tablet 1  . bisacodyl (DULCOLAX) 5 MG EC tablet Take 1 tablet (5 mg total) by mouth daily as needed for moderate constipation. 30 tablet 1  . budesonide-formoterol (SYMBICORT) 160-4.5 MCG/ACT inhaler Inhale 2 puffs into the lungs 2 (two) times daily. 1 Inhaler 1  . carvedilol (COREG) 6.25 MG tablet TAKE ONE TABLET BY MOUTH TWICE A DAY 180 tablet 3  . Cholecalciferol (VITAMIN D3) 50 MCG (2000 UT) TABS Take 2,000 Units by mouth 2 (two) times daily.    Marland Kitchen dextromethorphan-guaiFENesin (MUCINEX DM) 30-600 MG 12hr tablet Take 1 tablet by mouth 2 (two) times daily.    . fluticasone (FLONASE) 50 MCG/ACT nasal spray Place 2 sprays into both nostrils daily as needed for allergies or rhinitis.    . hydrOXYzine (VISTARIL) 25 MG capsule Take 25 mg by mouth 3 (three) times daily.    . methocarbamol (ROBAXIN) 750 MG tablet Take 1 tablet (750 mg total) by mouth 2 (two) times  daily as needed for muscle spasms. 60 tablet 0  . montelukast (SINGULAIR) 10 MG tablet Take 10 mg by mouth daily.     . rosuvastatin (CRESTOR) 10 MG tablet Take 1 tablet (10 mg total) by mouth daily. 90 tablet 1   No current facility-administered medications for this visit.     Diagnostic Tests:  n/a   Impression:  Patient is doing well approximately 1 year status post aortic valve replacement using a bioprosthetic tissue valve  Plan:  Patient will continue to follow-up intermittently with Dr.  Virgina Jock.  He will return to our office in the future only should specific problems or questions arise.    I discussed limitations of evaluation and management via telephone.  The patient was advised to call back for repeat telephone consultation or to seek an in-person evaluation if questions arise or the patient's clinical condition changes in any significant manner.  I spent in excess of 10 minutes of non-face-to-face time during the conduct of this telephone virtual office consultation.     Valentina Gu. Roxy Manns, MD 12/14/2019 10:50 AM

## 2019-12-14 NOTE — Patient Instructions (Signed)
Continue all previous medications without any changes at this time  

## 2019-12-29 NOTE — Progress Notes (Signed)
NEUROLOGY CONSULTATION NOTE  Charles Riggs. MRN: [id number] DOB: [birth year]  Referring provider: Patrecia Pour, MD Primary care provider: Patrecia Pour, MD  Reason for consult:  headache  HISTORY OF PRESENT ILLNESS: Charles Riggs. Is a 64 year old left-handed male with CHF, CKD, HTN, and OSA who presents for headache.  History supplemented by referring provider's note.  He has had headaches for several years.  He reports a moderate non-throbbing frontal pain.  They are associated with photophobia, phonophobia, nausea, blurred vision and eyes hurt.  No associated vomiting or weakness. It often lasts all day and occurs 3 to 4 times a week.  He treats with Tylenol 500mg  with mild efficacy.  He has chronic hip, back and leg pain (arthritis, lumbar stenosis, plantar fasciitis) which may trigger the headache.    Current NSAIDS:  ASA 81mg  daily Current analgesics:  Acetaminophen 500mg  Current triptans:  Sumatriptan 100mg  Current ergotamine:  none Current anti-emetic:  none Current muscle relaxants:  Robaxin Current anti-anxiolytic:  none Current sleep aide:  none Current Antihypertensive medications:  Amlodipine, carvedilol Current Antidepressant medications:  none Current Anticonvulsant medications:  none Current anti-CGRP:  none Current Vitamins/Herbal/Supplements:  Ferrous sulfate Current Antihistamines/Decongestants:  none Other therapy:  none Hormone/birth control:  none   Past NSAIDS:  meloxicam Past analgesics:  oxycodone Past abortive triptans:  Has tried a triptan Past abortive ergotamine:  none Past muscle relaxants:  methocarbamol Past anti-emetic:  Ondansetron 4mg  Past antihypertensive medications:  metoprolol, furosemide Past antidepressant medications:  Sertraline 100mg , amitriptyline Past anticonvulsant medications:  Gabapentin 400mg  TID, topiramate, maybe Depakote Past anti-CGRP:  none Past vitamins/Herbal/Supplements:  none Past  antihistamines/decongestants:  Diphenhydramine, hydroxyzine Other past therapies:  no  Caffeine:  Rarely drinks coffee or soda with caffeine Diet:  Drinks 24 oz water daily, otherwise juice. Exercise:  yes Depression:  yes; Anxiety:  yes Other pain:  Low back pain (lumbar stenosis), bilateral hip pain Sleep hygiene:  Poor even with CPAP. Family history of headache:  no  MRI of brain without contrast on 12/17/2002 reportedly showed "no brain abnormality".  CT Head from 11/22/2007 and 07/17/2016 were personally reviewed and were unremarkable.  08/15/2019 LABS:  WBC 4.23, HGB 12.7, HCT 39.3, Na 138, K 4.1, CO2 27, glucose 97, Cr 1.820, t bili 1.1, ALT 25, AST 15   PAST MEDICAL HISTORY: Past Medical History:  Diagnosis Date   Acute on chronic diastolic heart failure ([facility])    Anemia in chronic kidney disease    Anxiety    Aortic insufficiency    Arthritis    Asthma    CHF (congestive heart failure) ([facility])    CKD (chronic kidney disease)    Dyspnea    Essential hypertension    Nerve pain    Non-rheumatic aortic regurgitation    Obstructive sleep apnea    cpap   Quadricuspid aortic valve    S/P minimally invasive aortic valve replacement with bioprosthetic valve 12/16/2018   25 mm Edwards Inspiris Resilia stented bovine pericardial tissue valve via right mini thoracotomy approach   Severe aortic insufficiency    SOB (shortness of breath)    Syncope 12/2018    PAST SURGICAL HISTORY: Past Surgical History:  Procedure Laterality Date   AORTIC VALVE REPLACEMENT N/A 12/16/2018   Procedure: MINIMALLY INVASIVE AORTIC VALVE REPLACEMENT (AVR) using Inspiris Aortic valve 32mm.;  Surgeon: Rexene Alberts, MD;  Location: [facility];  Service: Open Heart Surgery;  Laterality: N/A;   BACK SURGERY  HERNIA REPAIR     IR THORACENTESIS ASP PLEURAL SPACE W/IMG GUIDE  12/29/2018   RIB PLATING  12/16/2018   Procedure: Rib Plating;  Surgeon: Rexene Alberts, MD;  Location: [facility];   Service: Open Heart Surgery;;   RIGHT/LEFT HEART CATH AND CORONARY ANGIOGRAPHY N/A 10/14/2018   Procedure: RIGHT/LEFT HEART CATH AND CORONARY ANGIOGRAPHY;  Surgeon: Nigel Mormon, MD;  Location: [facility];  Service: Cardiovascular;  Laterality: N/A;   TEE WITHOUT CARDIOVERSION N/A 11/11/2018   Procedure: TRANSESOPHAGEAL ECHOCARDIOGRAM (TEE);  Surgeon: Nigel Mormon, MD;  Location: [facility] ENDOSCOPY;  Service: Cardiovascular;  Laterality: N/A;   TEE WITHOUT CARDIOVERSION N/A 12/16/2018   Procedure: TRANSESOPHAGEAL ECHOCARDIOGRAM (TEE);  Surgeon: Rexene Alberts, MD;  Location: [facility];  Service: Open Heart Surgery;  Laterality: N/A;   TOTAL HIP ARTHROPLASTY Left 03/09/2019   Procedure: LEFT TOTAL HIP ARTHROPLASTY ANTERIOR APPROACH;  Surgeon: Leandrew Koyanagi, MD;  Location: [facility];  Service: Orthopedics;  Laterality: Left;    MEDICATIONS: Current Outpatient Medications on File Prior to Visit  Medication Sig Dispense Refill   albuterol (PROVENTIL HFA;VENTOLIN HFA) 108 (90 Base) MCG/ACT inhaler Inhale 2 puffs into the lungs every 6 (six) hours as needed for wheezing or shortness of breath.     albuterol (PROVENTIL) (2.5 MG/3ML) 0.083% nebulizer solution Take 3 mLs (2.5 mg total) by nebulization every 6 (six) hours as needed for wheezing or shortness of breath. 75 mL 12   amLODipine (NORVASC) 10 MG tablet Take 10 mg by mouth daily.     aspirin EC 81 MG tablet Take 1 tablet (81 mg total) by mouth daily. 90 tablet 1   bisacodyl (DULCOLAX) 5 MG EC tablet Take 1 tablet (5 mg total) by mouth daily as needed for moderate constipation. 30 tablet 1   budesonide-formoterol (SYMBICORT) 160-4.5 MCG/ACT inhaler Inhale 2 puffs into the lungs 2 (two) times daily. 1 Inhaler 1   carvedilol (COREG) 6.25 MG tablet TAKE ONE TABLET BY MOUTH TWICE A DAY 180 tablet 3   Cholecalciferol (VITAMIN D3) 50 MCG (2000 UT) TABS Take 2,000 Units by mouth 2 (two) times daily.      dextromethorphan-guaiFENesin (MUCINEX DM) 30-600 MG 12hr tablet Take 1 tablet by mouth 2 (two) times daily.     fluticasone (FLONASE) 50 MCG/ACT nasal spray Place 2 sprays into both nostrils daily as needed for allergies or rhinitis.     hydrOXYzine (VISTARIL) 25 MG capsule Take 25 mg by mouth 3 (three) times daily.     methocarbamol (ROBAXIN) 750 MG tablet Take 1 tablet (750 mg total) by mouth 2 (two) times daily as needed for muscle spasms. 60 tablet 0   montelukast (SINGULAIR) 10 MG tablet Take 10 mg by mouth daily.      rosuvastatin (CRESTOR) 10 MG tablet Take 1 tablet (10 mg total) by mouth daily. 90 tablet 1   No current facility-administered medications on file prior to visit.    ALLERGIES: No Known Allergies  FAMILY HISTORY: Family History  Problem Relation Age of Onset   COPD Father    Cancer Brother     SOCIAL HISTORY: Social History   Socioeconomic History   Marital status: Married    Spouse name: Not on file   Number of children: 4   Years of education: Not on file   Highest education level: Not on file  Occupational History   Not on file  Tobacco Use   Smoking status: Former Smoker    Packs/day: 1.00  Years: 7.00    Pack years: 7.00    Types: Cigarettes    Quit date: 2011    Years since quitting: 10.5   Smokeless tobacco: Never Used   Tobacco comment: 30 PY  Vaping Use   Vaping Use: Never used  Substance and Sexual Activity   Alcohol use: No   Drug use: No   Sexual activity: Not on file  Other Topics Concern   Not on file  Social History Narrative   Not on file   Social Determinants of Health   Financial Resource Strain:    Difficulty of Paying Living Expenses:   Food Insecurity:    Worried About Charity fundraiser in the Last Year:    Arboriculturist in the Last Year:   Transportation Needs:    Film/video editor (Medical):    Lack of Transportation (Non-Medical):   Physical Activity:    Days of Exercise  per Week:    Minutes of Exercise per Session:   Stress:    Feeling of Stress :   Social Connections:    Frequency of Communication with Friends and Family:    Frequency of Social Gatherings with Friends and Family:    Attends Religious Services:    Active Member of Clubs or Organizations:    Attends Music therapist:    Marital Status:   Intimate Partner Violence:    Fear of Current or Ex-Partner:    Emotionally Abused:    Physically Abused:    Sexually Abused:     PHYSICAL EXAM: Blood pressure (!) 132/91, pulse 75, height 5\' 10"  (1.778 m), weight 258 lb (117 kg), SpO2 97 %.  General: No acute distress.  Patient appears well-groomed.   Head:  Normocephalic/atraumatic Eyes:  fundi examined but not visualized Neck: supple, no paraspinal tenderness, full range of motion Back: No paraspinal tenderness Heart: regular rate and rhythm Lungs: Clear to auscultation bilaterally. Vascular: No carotid bruits. Neurological Exam: Mental status: alert and oriented to person, place, and time, recent and remote memory intact, fund of knowledge intact, attention and concentration intact, speech fluent and not dysarthric, language intact. Cranial nerves: CN I: not tested CN II: pupils equal, round and reactive to light, visual fields intact CN III, IV, VI:  full range of motion, no nystagmus, no ptosis CN V: facial sensation intact CN VII: upper and lower face symmetric CN VIII: hearing intact CN IX, X: gag intact, uvula midline CN XI: sternocleidomastoid and trapezius muscles intact CN XII: tongue midline Bulk & Tone: normal, no fasciculations. Motor:  5/5 throughout  Sensation:  Pinprick and vibration sensation reduced in left foot. Deep Tendon Reflexes:  2+ throughout, toes downgoing.  Finger to nose testing:  Without dysmetria.   Heel to shin:  Without dysmetria.   Gait:  Normal station and stride. Romberg negative.  IMPRESSION: Migraine without aura,  without status migrainosus, not intractable Chronic low back pain OSA  PLAN: 1.  For preventative management, Aimovig 70mg  every 28 days 2.  For abortive therapy, he will try samples of Ubrelvy 100mg . If effective, will send prescription. 3.  Limit use of pain relievers to no more than 2 days out of week to prevent risk of rebound or medication-overuse headache. 4.  Keep headache diary 5.  Exercise, hydration, caffeine cessation, sleep hygiene, monitor for and avoid triggers 6.  Follow up 6 months   Thank you for allowing me to take part in the care of this patient.  Quita Skye  [place], DO  CC: Patrecia Pour, MD

## 2019-12-30 ENCOUNTER — Encounter: Payer: Self-pay | Admitting: Neurology

## 2019-12-30 ENCOUNTER — Other Ambulatory Visit: Payer: Self-pay

## 2019-12-30 ENCOUNTER — Ambulatory Visit (INDEPENDENT_AMBULATORY_CARE_PROVIDER_SITE_OTHER): Payer: No Typology Code available for payment source | Admitting: Neurology

## 2019-12-30 VITALS — BP 132/91 | HR 75 | Ht 70.0 in | Wt 258.0 lb

## 2019-12-30 DIAGNOSIS — G43009 Migraine without aura, not intractable, without status migrainosus: Secondary | ICD-10-CM

## 2019-12-30 DIAGNOSIS — M5441 Lumbago with sciatica, right side: Secondary | ICD-10-CM | POA: Diagnosis not present

## 2019-12-30 DIAGNOSIS — G4733 Obstructive sleep apnea (adult) (pediatric): Secondary | ICD-10-CM | POA: Diagnosis not present

## 2019-12-30 DIAGNOSIS — M5442 Lumbago with sciatica, left side: Secondary | ICD-10-CM

## 2019-12-30 DIAGNOSIS — G8929 Other chronic pain: Secondary | ICD-10-CM

## 2019-12-30 MED ORDER — UBRELVY 100 MG PO TABS
100.0000 mg | ORAL_TABLET | Freq: Two times a day (BID) | ORAL | 0 refills | Status: DC
Start: 2019-12-30 — End: 2020-07-01

## 2019-12-30 MED ORDER — AIMOVIG 70 MG/ML ~~LOC~~ SOAJ: 70.0000 mg | SUBCUTANEOUS | 11 refills | Status: DC

## 2019-12-30 NOTE — Patient Instructions (Signed)
  1. Start Aimovig 70mg  injection every 28 days 2. Take Ubrelvy 100mg  at earliest onset of headache.  May repeat dose once in 2 hours if needed.  Maximum 2 tablets in 24 hours.  If effective, contact me for prescription 3. Limit use of pain relievers to no more than 2 days out of the week.  These medications include acetaminophen, NSAIDs (ibuprofen/Advil/Motrin, naproxen/Aleve, triptans (Imitrex/sumatriptan), Excedrin, and narcotics.  This will help reduce risk of rebound headaches. 4. Be aware of common food triggers:  - Caffeine:  coffee, black tea, cola, Mt. Dew  - Chocolate  - Dairy:  aged cheeses (brie, blue, cheddar, gouda, [place], provolone, [place], Swiss, etc), chocolate milk, buttermilk, sour cream, limit eggs and yogurt  - Nuts, peanut butter  - Alcohol  - Cereals/grains:  FRESH breads (fresh bagels, sourdough, doughnuts), yeast productions  - Processed/canned/aged/cured meats (pre-packaged deli meats, hotdogs)  - MSG/glutamate:  soy sauce, flavor enhancer, pickled/preserved/marinated foods  - Sweeteners:  aspartame (Equal, Nutrasweet).  Sugar and Splenda are okay  - Vegetables:  legumes (lima beans, lentils, snow peas, fava beans, pinto peans, peas, garbanzo beans), sauerkraut, onions, olives, pickles  - Fruit:  avocados, bananas, citrus fruit (orange, lemon, grapefruit), mango  - Other:  Frozen meals, macaroni and cheese 5. Routine exercise 6. Stay adequately hydrated (aim for 64 oz water daily) 7. Keep headache diary 8. Maintain proper stress management 9. Maintain proper sleep hygiene 10. Do not skip meals 11. Consider supplements:  magnesium citrate 400mg  daily, riboflavin 400mg  daily, coenzyme Q10 100mg  three times daily.  CHECK WITH YOUR INSURANCE TO SEE IF THEY WILL COVER ANY OF THESE MEDICATIONS: UBRELVY NURTEC REYVOW

## 2019-12-30 NOTE — Addendum Note (Signed)
Addended by: Armen Pickup A on: 12/30/2019 08:52 AM   Modules accepted: Orders

## 2019-12-30 NOTE — Addendum Note (Signed)
Addended by: Armen Pickup A on: 12/30/2019 08:58 AM   Modules accepted: Orders

## 2020-03-03 ENCOUNTER — Ambulatory Visit: Payer: No Typology Code available for payment source | Admitting: Orthopaedic Surgery

## 2020-04-19 ENCOUNTER — Telehealth: Payer: Self-pay | Admitting: *Deleted

## 2020-04-19 NOTE — Telephone Encounter (Signed)
-----   Message from Donnella Sham, RN sent at 04/19/2020 10:09 AM EST ----- Regarding: FW: Chronic Kidney Disease  ----- Message ----- From: Rexene Alberts, MD Sent: 04/19/2020   8:20 AM EST To: Donnella Sham, RN Subject: RE: Chronic Kidney Disease                     He has had laboratory evidence of early stage CKD which is very, very common in patients his age.  Most of the time it does not progress unless patient's other medical conditions are progressive or poorly controlled, such as hypertension, diabetes and/or heart failure.  I cannot comment on what life insurance companies consider problematic.  Each company has different criteria.  He may want to discuss further with his primary care physician. ----- Message ----- From: Donnella Sham, RN Sent: 04/18/2020   5:27 PM EST To: Rexene Alberts, MD Subject: Chronic Kidney Disease                         Hey,  He called concerned about being denied life insurance because a prior diagnosis of Chronic Kidney Disease.  He states that no one has ever told him he had kidney disease.  I see where it was mentioned in a note done by you while patient was admitted to the hospital shortly after surgery.  I looked back at past BUN/Creatinine lab work and present and do see elevations chronically.  He just wanted further clarification.  He was told that you were the one that documented it for the first time?  Please advise.  Thanks,  Caryl Pina

## 2020-04-19 NOTE — Telephone Encounter (Signed)
Spoke with Mr. Charles Riggs regarding CKD diagnosis and life insurance policy. Notified him of Dr. Guy Sandifer response. Pt states he will f/u with his PCP regarding his kidney function. All questions answered.

## 2020-04-29 ENCOUNTER — Ambulatory Visit (INDEPENDENT_AMBULATORY_CARE_PROVIDER_SITE_OTHER): Payer: 59 | Admitting: Orthopaedic Surgery

## 2020-04-29 ENCOUNTER — Ambulatory Visit (INDEPENDENT_AMBULATORY_CARE_PROVIDER_SITE_OTHER): Payer: 59

## 2020-04-29 ENCOUNTER — Encounter: Payer: Self-pay | Admitting: Orthopaedic Surgery

## 2020-04-29 ENCOUNTER — Other Ambulatory Visit: Payer: Self-pay

## 2020-04-29 DIAGNOSIS — Z96642 Presence of left artificial hip joint: Secondary | ICD-10-CM

## 2020-04-29 DIAGNOSIS — M1611 Unilateral primary osteoarthritis, right hip: Secondary | ICD-10-CM

## 2020-04-29 NOTE — Progress Notes (Signed)
Office Visit Note   Patient: Charles Riggs.           Date of Birth: [birth year]           MRN: [id number] Visit Date: 04/29/2020              Requested by: Charles Riggs, [place] [id number] PCP: Charles Riggs: Visit Diagnoses:  1. Primary osteoarthritis of right hip   2. Status post total replacement of left hip     Plan: Impression is 1 year status post left total hip replacement and moderate right hip DJD.  He has done very well from the surgery.  Dental prophylaxis reinforced.  His right hip is currently not bothering him and so he is not interested in having hip replacement at this time.  We will see him back in another year with standing AP pelvis x-rays.  Sooner if his right hip starts to bother him.  Follow-Up Instructions: Return in about 1 year (around 04/29/2021).   Orders:  Orders Placed This Encounter  Procedures  . XR Pelvis 1-2 Views   No orders of the defined types were placed in this encounter.     Procedures: No procedures performed   Clinical Data: No additional findings.   Subjective: Chief Complaint  Patient presents with  . Left Hip - Pain    Charles Riggs is following up for his left total hip replacement and right hip DJD.  In terms of the left hip he is doing well and has minimal discomfort.  He takes Tylenol occasionally.  Does some physical therapy from time to time.  He is very pleased with how he has recovered from the hip replacement.  In terms of the right hip he states that it is not really bothering him.  He is doing okay.   Review of Systems  Constitutional: Negative.   All other systems reviewed and are negative.    Objective: Vital Signs: There were no vitals taken for this visit.  Physical Exam Vitals and nursing note reviewed.  Constitutional:      Appearance: He is well-developed.  Pulmonary:     Effort: Pulmonary effort is normal.  Abdominal:     Palpations:  Abdomen is soft.  Skin:    General: Skin is warm.  Neurological:     Mental Status: He is alert and oriented to person, place, and time.  Psychiatric:        Behavior: Behavior normal.        Thought Content: Thought content normal.        Judgment: Judgment normal.     Ortho Exam Mild antalgic gait due to right hip.  Left hip surgical scar is fully healed.  Good range of motion without pain.  Right hip elicits mild pain with range of motion. Specialty Comments:  No specialty comments available.  Imaging: XR Pelvis 1-2 Views  Result Date: 04/29/2020 Stable left total hip replacement.  Moderate right hip DJD.    PMFS History: Patient Active Problem List   Diagnosis Date Noted  . Primary osteoarthritis of right hip 10/21/2019  . Anemia 10/15/2019  . Hyperlipidemia 10/15/2019  . Status post total replacement of left hip 03/09/2019  . Primary osteoarthritis of left hip 02/10/2019  . Acute respiratory failure with hypoxemia ([facility]) 01/04/2019  . Pleural effusion 12/28/2018  . Syncope 12/28/2018  . Acute blood loss anemia   . S/P AVR (  aortic valve replacement) 12/16/2018  . Anemia in chronic kidney disease   . Chronic kidney disease (CKD)   . Coronary artery disease involving native coronary artery of native heart without angina pectoris 09/04/2018  . Elevated troponin 08/29/2018  . Severe aortic insufficiency   . Acute on chronic diastolic heart failure ([place])   . Nonrheumatic aortic valve insufficiency   . Essential hypertension   . Obstructive sleep apnea   . Quadricuspid aortic valve   . Chest pain in adult 08/27/2018  . LEG PAIN 04/26/2010  . DEPRESSION 08/19/2009  . RENAL CALCULUS 01/05/2009  . DM 10/12/2008  . Personal history presenting hazards to health 06/16/2007  . Asthma 03/24/2007  . GERD 03/24/2007   Past Medical History:  Diagnosis Date  . Acute on chronic diastolic heart failure ([place])   . Anemia in chronic kidney disease   . Anxiety   . Aortic  insufficiency   . Arthritis   . Asthma   . CHF (congestive heart failure) ([facility])   . CKD (chronic kidney disease)   . Dyspnea   . Essential hypertension   . Nerve pain   . Non-rheumatic aortic regurgitation   . Obstructive sleep apnea    cpap  . Quadricuspid aortic valve   . S/P minimally invasive aortic valve replacement with bioprosthetic valve 12/16/2018   25 mm Edwards Inspiris Resilia stented bovine pericardial tissue valve via right mini thoracotomy approach  . Severe aortic insufficiency   . SOB (shortness of breath)   . Syncope 12/2018    Family History  Problem Relation Age of Onset  . COPD Father   . Cancer Brother     Past Surgical History:  Procedure Laterality Date  . AORTIC VALVE REPLACEMENT N/A 12/16/2018   Procedure: MINIMALLY INVASIVE AORTIC VALVE REPLACEMENT (AVR) using Inspiris Aortic valve 23mm.;  Surgeon: Charles Alberts, MD;  Location: [facility];  Service: Open Heart Surgery;  Laterality: N/A;  . BACK SURGERY    . HERNIA REPAIR    . IR THORACENTESIS ASP PLEURAL SPACE W/IMG GUIDE  12/29/2018  . RIB PLATING  12/16/2018   Procedure: Rib Plating;  Surgeon: Charles Alberts, MD;  Location[facility];  Service: Open Heart Surgery;;  . RIGHT/LEFT HEART CATH AND CORONARY ANGIOGRAPHY N/A 10/14/2018   Procedure: RIGHT/LEFT HEART CATH AND CORONARY ANGIOGRAPHY;  Surgeon: Charles Mormon, MD;  Location: [facility];  Service: Cardiovascular;  Laterality: N/A;  . TEE WITHOUT CARDIOVERSION N/A 11/11/2018   Procedure: TRANSESOPHAGEAL ECHOCARDIOGRAM (TEE);  Surgeon: Charles Mormon, MD;  Location: [facility] ENDOSCOPY;  Service: Cardiovascular;  Laterality: N/A;  . TEE WITHOUT CARDIOVERSION N/A 12/16/2018   Procedure: TRANSESOPHAGEAL ECHOCARDIOGRAM (TEE);  Surgeon: Charles Alberts, MD;  Location: [facility];  Service: Open Heart Surgery;  Laterality: N/A;  . TOTAL HIP ARTHROPLASTY Left 03/09/2019   Procedure: LEFT TOTAL HIP ARTHROPLASTY ANTERIOR APPROACH;  Surgeon: Charles Koyanagi, MD;   Location: [facility];  Service: Orthopedics;  Laterality: Left;   Social History   Occupational History  . Not on file  Tobacco Use  . Smoking status: Former Smoker    Packs/day: 1.00    Years: 7.00    Pack years: 7.00    Types: Cigarettes    Quit date: 2011    Years since quitting: 10.9  . Smokeless tobacco: Never Used  . Tobacco comment: 30 PY  Vaping Use  . Vaping Use: Never used  Substance and Sexual Activity  . Alcohol use: No  .  Drug use: No  . Sexual activity: Not on file       

## 2020-06-29 NOTE — Progress Notes (Signed)
NEUROLOGY FOLLOW UP OFFICE NOTE  Hansen Switzer [id number]   Subjective:  Charles Riggs. Is a 65 year old left-handed male with CHF, CKD, HTN, and OSA who follows up for migraines.  UPDATE: Prescribed Aimovig '70mg'$  in August and had him try samples of Ubrelvy '100mg'$ .  Only modest improvement. He started having pain in the back of the head as well.  A month ago, he had occipital nerve block which aborted the headache for 3 weeks.  He now has dull headache but still with photophobia.  These headaches occur once a week. Ubrelvy help when the headaches were severe, aborting over 2 years.  Current NSAIDS:  ASA '81mg'$  daily Current analgesics:  Acetaminophen '500mg'$  Current triptans:  Sumatriptan '100mg'$  Current ergotamine:  none Current anti-emetic:  none Current muscle relaxants:  Robaxin Current anti-anxiolytic:  none Current sleep aide:  none Current Antihypertensive medications:  Amlodipine, carvedilol Current Antidepressant medications:  none Current Anticonvulsant medications:  none Current anti-CGRP:  Aimovig '70mg'$ , Ubrelvy '100mg'$  Current Vitamins/Herbal/Supplements:  Ferrous sulfate Current Antihistamines/Decongestants:  none Other therapy:  none Hormone/birth control:  none  Caffeine:  Rarely drinks coffee or soda with caffeine Diet:  Drinks 24 oz water daily, otherwise juice. Exercise:  yes Depression:  yes; Anxiety:  yes Other pain:  Low back pain (lumbar stenosis), bilateral hip pain Sleep hygiene:  Poor even with CPAP.  HISTORY: He has had headaches for several years.  He reports a moderate non-throbbing frontal pain.  They are associated with photophobia, phonophobia, nausea, blurred vision and eyes hurt.  No associated vomiting or weakness. It often lasts all day and occurs 3 to 4 times a week.  He treats with Tylenol '500mg'$  with mild efficacy.  He has chronic hip, back and leg pain (arthritis, lumbar stenosis, plantar fasciitis) which may trigger the headache.      Past NSAIDS:  meloxicam Past analgesics:  oxycodone Past abortive triptans:  Has tried a triptan Past abortive ergotamine:  none Past muscle relaxants:  methocarbamol Past anti-emetic:  Ondansetron '4mg'$  Past antihypertensive medications:  metoprolol, furosemide Past antidepressant medications:  Sertraline '100mg'$ , amitriptyline Past anticonvulsant medications:  Gabapentin '400mg'$  TID, topiramate, maybe Depakote Past anti-CGRP:  none Past vitamins/Herbal/Supplements:  none Past antihistamines/decongestants:  Diphenhydramine, hydroxyzine Other past therapies:  no   Family history of headache:  no  MRI of brain without contrast on 12/17/2002 reportedly showed "no brain abnormality".  CT Head from 11/22/2007 and 07/17/2016 were personally reviewed and were unremarkable.  PAST MEDICAL HISTORY: Past Medical History:  Diagnosis Date   Acute on chronic diastolic heart failure (H[facility])    Anemia in chronic kidney disease    Anxiety    Aortic insufficiency    Arthritis    Asthma    CHF (congestive heart failure) ([facility])    CKD (chronic kidney disease)    Dyspnea    Essential hypertension    Nerve pain    Non-rheumatic aortic regurgitation    Obstructive sleep apnea    cpap   Quadricuspid aortic valve    S/P minimally invasive aortic valve replacement with bioprosthetic valve 12/16/2018   25 mm Edwards Inspiris Resilia stented bovine pericardial tissue valve via right mini thoracotomy approach   Severe aortic insufficiency    SOB (shortness of breath)    Syncope 12/2018    MEDICATIONS: Current Outpatient Medications on File Prior to Visit  Medication Sig Dispense Refill   albuterol (PROVENTIL HFA;VENTOLIN HFA) 108 (90 Base) MCG/ACT inhaler Inhale 2 puffs into the  lungs every 6 (six) hours as needed for wheezing or shortness of breath.     albuterol (PROVENTIL) (2.5 MG/3ML) 0.083% nebulizer solution Take 3 mLs (2.5 mg total) by nebulization every 6 (six) hours as  needed for wheezing or shortness of breath. 75 mL 12   amLODipine (NORVASC) 10 MG tablet Take 10 mg by mouth daily.     aspirin EC 81 MG tablet Take 1 tablet (81 mg total) by mouth daily. 90 tablet 1   budesonide-formoterol (SYMBICORT) 160-4.5 MCG/ACT inhaler Inhale 2 puffs into the lungs 2 (two) times daily. 1 Inhaler 1   carvedilol (COREG) 6.25 MG tablet TAKE ONE TABLET BY MOUTH TWICE A DAY 180 tablet 3   Cholecalciferol (VITAMIN D3) 50 MCG (2000 UT) TABS Take 2,000 Units by mouth 2 (two) times daily.     dextromethorphan-guaiFENesin (MUCINEX DM) 30-600 MG 12hr tablet Take 1 tablet by mouth 2 (two) times daily.     Erenumab-aooe (AIMOVIG) 70 MG/ML SOAJ Inject 70 mg into the skin every 28 (twenty-eight) days. 1 pen 11   fluticasone (FLONASE) 50 MCG/ACT nasal spray Place 2 sprays into both nostrils daily as needed for allergies or rhinitis.     hydrOXYzine (VISTARIL) 25 MG capsule Take 25 mg by mouth 3 (three) times daily. (Patient not taking: Reported on 12/30/2019)     methocarbamol (ROBAXIN) 750 MG tablet Take 1 tablet (750 mg total) by mouth 2 (two) times daily as needed for muscle spasms. 60 tablet 0   montelukast (SINGULAIR) 10 MG tablet Take 10 mg by mouth daily.  (Patient not taking: Reported on 12/30/2019)     rosuvastatin (CRESTOR) 10 MG tablet Take 1 tablet (10 mg total) by mouth daily. 90 tablet 1   Ubrogepant (UBRELVY) 100 MG TABS Take 100 mg by mouth in the morning and at bedtime. 4 tablet 0   No current facility-administered medications on file prior to visit.    ALLERGIES: No Known Allergies  FAMILY HISTORY: Family History  Problem Relation Age of Onset   COPD Father    Cancer Brother     SOCIAL HISTORY: Social History   Socioeconomic History   Marital status: Married    Spouse name: Not on file   Number of children: 4   Years of education: Not on file   Highest education level: Not on file  Occupational History   Not on file  Tobacco Use    Smoking status: Former Smoker    Packs/day: 1.00    Years: 7.00    Pack years: 7.00    Types: Cigarettes    Quit date: 2011    Years since quitting: 11.0   Smokeless tobacco: Never Used   Tobacco comment: 30 PY  Vaping Use   Vaping Use: Never used  Substance and Sexual Activity   Alcohol use: No   Drug use: No   Sexual activity: Not on file  Other Topics Concern   Not on file  Social History Narrative   Left Handed   [employer] [place] Home   Drinks little caffeine    Social Determinants of Health   Financial Resource Strain: Not on file  Food Insecurity: Not on file  Transportation Needs: Not on file  Physical Activity: Not on file  Stress: Not on file  Social Connections: Not on file  Intimate Partner Violence: Not on file     Objective:  Blood pressure (!) 142/81, pulse 87, height '5\' 11"'$  (1.803 m), weight 277 lb 9.6 oz (125.9 kg), SpO2  96 %.  General: No acute distress.  Patient appears well-groomed.   Head:  Normocephalic/atraumatic Eyes:  Fundi examined but not visualized Neck: supple, no paraspinal tenderness, full range of motion Heart:  Regular rate and rhythm Lungs:  Clear to auscultation bilaterally Back: No paraspinal tenderness Neurological Exam: alert and oriented to person, place, and time. Attention span and concentration intact, recent and remote memory intact, fund of knowledge intact.  Speech fluent and not dysarthric, language intact.  CN II-XII intact. Bulk and tone normal, muscle strength 5/5 throughout.  Sensation to light touch, temperature and vibration intact.  Deep tendon reflexes 2+ throughout.  Finger to nose and heel to shin testing intact.  Gait normal, Romberg negative.   Assessment/Plan:   1.  Migraine without aura, without status migrainosus, not intractable.  Doing well since occipital nerve blocks. As Aimovig '70mg'$  ineffective, will discontinue and see how he does off of it. 2.  Chronic low back pain 3.  OSA  1.  Migraine prevention:   Stop Aimovig '70mg'$ .  If migraines return, he may pursue repeat occipital nerve block or restart Aimovig at '140mg'$  2.  Migraine rescue:  Ubrelvy '100mg'$  3.  Limit use of pain relievers to no more than 2 days out of week to prevent risk of rebound or medication-overuse headache. 4.  Keep headache diary 5.  Management of OSA 6.  Follow up 6 months  Metta Clines, DO

## 2020-07-01 ENCOUNTER — Encounter: Payer: Self-pay | Admitting: Neurology

## 2020-07-01 ENCOUNTER — Ambulatory Visit (INDEPENDENT_AMBULATORY_CARE_PROVIDER_SITE_OTHER): Payer: 59 | Admitting: Neurology

## 2020-07-01 ENCOUNTER — Other Ambulatory Visit: Payer: Self-pay

## 2020-07-01 VITALS — BP 142/81 | HR 87 | Ht 71.0 in | Wt 277.6 lb

## 2020-07-01 DIAGNOSIS — G43009 Migraine without aura, not intractable, without status migrainosus: Secondary | ICD-10-CM | POA: Diagnosis not present

## 2020-07-01 DIAGNOSIS — M5441 Lumbago with sciatica, right side: Secondary | ICD-10-CM | POA: Diagnosis not present

## 2020-07-01 DIAGNOSIS — M5442 Lumbago with sciatica, left side: Secondary | ICD-10-CM

## 2020-07-01 DIAGNOSIS — G4733 Obstructive sleep apnea (adult) (pediatric): Secondary | ICD-10-CM

## 2020-07-01 DIAGNOSIS — G8929 Other chronic pain: Secondary | ICD-10-CM

## 2020-07-01 MED ORDER — UBRELVY 100 MG PO TABS
1.0000 | ORAL_TABLET | ORAL | 5 refills | Status: DC | PRN
Start: 2020-07-01 — End: 2020-11-24

## 2020-07-01 NOTE — Patient Instructions (Signed)
1.  We will stop the Aimovig injection and see how you continue to do after the occipital nerve blocks.  If headaches return, we can restart Aimovig at a higher dose (just let me know) 2.  I will send in a prescription for the rescue medication, Ubrelvy.  I will give you some samples in the meantime 3.  Follow up 6 months.

## 2020-07-04 ENCOUNTER — Encounter: Payer: Self-pay | Admitting: Neurology

## 2020-07-04 NOTE — Progress Notes (Signed)
Charles Riggs (Key: [id number]) Rx #: [id number] [name] '100MG'$  tablets   Form [employer] Medication Request Form  Plan Contact [phone] phone [phone] fax Created 3 days ago Sent to Plan 3 days ago Determination Favorable Approval valid from 07/02/20 to 12/29/20

## 2020-07-07 ENCOUNTER — Telehealth: Payer: Self-pay

## 2020-07-07 NOTE — Telephone Encounter (Signed)
Patient contacted the office over night with concerns about chest pain that has been occurring since 06/30/20 right after working out.  He was advised to contact his Cardiology office for follow-up.  He acknowledged receipt.

## 2020-07-07 NOTE — Telephone Encounter (Signed)
If chest pain continues after working out or at rest, patient should contact 911 or go to the nearest emergency room.

## 2020-07-11 ENCOUNTER — Telehealth: Payer: Self-pay | Admitting: Cardiology

## 2020-07-11 DIAGNOSIS — R200 Anesthesia of skin: Secondary | ICD-10-CM | POA: Insufficient documentation

## 2020-07-11 NOTE — Telephone Encounter (Signed)
Received a call from the patient. Patient has had pain in shoulders blades and numbness on his right arm, constant for last several days. Pain is 24X7. Overall, low suspicion for MI. He is worried he could be having a stroke. Symptoms started after he saw his neurologist Dr. Tomi Likens on 07/01/20. Hard to evaluate over the phone alone. He needs to be seen and evaluated in person and/or video visit. He is going to call his PCP.    Charles Mormon, MD Pager: [phone] Office: [phone]

## 2020-07-13 ENCOUNTER — Encounter: Payer: Self-pay | Admitting: Cardiology

## 2020-07-13 ENCOUNTER — Ambulatory Visit: Payer: No Typology Code available for payment source | Admitting: Cardiology

## 2020-07-13 ENCOUNTER — Other Ambulatory Visit: Payer: Self-pay

## 2020-07-13 VITALS — BP 121/81 | HR 70 | Temp 97.2°F | Resp 16 | Ht 71.0 in | Wt 276.0 lb

## 2020-07-13 DIAGNOSIS — I10 Essential (primary) hypertension: Secondary | ICD-10-CM

## 2020-07-13 DIAGNOSIS — M25511 Pain in right shoulder: Secondary | ICD-10-CM | POA: Insufficient documentation

## 2020-07-13 NOTE — Progress Notes (Signed)
Subjective:   Charles Setters., male    DOB: 01-14-1956, 65 y.o.   MRN: [id number]   Chief complaint:  Aortic regurgitation  Hypertension Associated symptoms include neck pain. Pertinent negatives include no chest pain or palpitations.    65 year old African-American male, 58 PY former smoker, OSA, LBBB, h/o asthma, nonobstructive CAD, s/p bioprosthetic AVR (25 mm) through minithoracotomy (12/16/2018 Dr Roxy Manns) for severe AI with quadricuspid aortic valve.   Patient has had right shoulder pain, upper back pain, right arm pain and numbness in right 4th and 5th fingers for last two weeks. He denies any focal weakness, slurred speech, vision changes. He is concerned if symptoms are related to stroke.    Current Outpatient Medications on File Prior to Visit  Medication Sig Dispense Refill  . albuterol (PROVENTIL HFA;VENTOLIN HFA) 108 (90 Base) MCG/ACT inhaler Inhale 2 puffs into the lungs every 6 (six) hours as needed for wheezing or shortness of breath.    Marland Kitchen albuterol (PROVENTIL) (2.5 MG/3ML) 0.083% nebulizer solution Take 3 mLs (2.5 mg total) by nebulization every 6 (six) hours as needed for wheezing or shortness of breath. 75 mL 12  . amLODipine (NORVASC) 10 MG tablet Take 10 mg by mouth daily.    Marland Kitchen aspirin EC 81 MG tablet Take 1 tablet (81 mg total) by mouth daily. 90 tablet 1  . carvedilol (COREG) 6.25 MG tablet TAKE ONE TABLET BY MOUTH TWICE A DAY 180 tablet 3  . Cholecalciferol (VITAMIN D3) 50 MCG (2000 UT) TABS Take 2,000 Units by mouth 2 (two) times daily.    Marland Kitchen dextromethorphan-guaiFENesin (MUCINEX DM) 30-600 MG 12hr tablet Take 1 tablet by mouth 2 (two) times daily.    Eduard Roux (AIMOVIG) 70 MG/ML SOAJ Inject 70 mg into the skin every 28 (twenty-eight) days. 1 pen 11  . fluticasone (FLONASE) 50 MCG/ACT nasal spray Place 2 sprays into both nostrils daily as needed for allergies or rhinitis.    . Fluticasone Furoate (ARNUITY ELLIPTA) 50 MCG/ACT AEPB in the morning and  at bedtime.    . Fluticasone-Salmeterol (ADVAIR) 250-50 MCG/DOSE AEPB INHALE 1 INHALATION TWICE A DAY (RINSE MOUTH WELL WITH WATER AFTER EACH USE (CONVERTED FROM SYMBICORT. NOTE NEW DIRECTIONS))    . gabapentin (NEURONTIN) 400 MG capsule Take 1 capsule by mouth 3 (three) times daily.    . hydrOXYzine (VISTARIL) 25 MG capsule Take 25 mg by mouth 3 (three) times daily.    Marland Kitchen losartan (COZAAR) 50 MG tablet Take 50 mg by mouth daily.    . methocarbamol (ROBAXIN) 750 MG tablet Take 1 tablet (750 mg total) by mouth 2 (two) times daily as needed for muscle spasms. 60 tablet 0  . metoprolol tartrate (LOPRESSOR) 50 MG tablet Take 50 mg by mouth daily.    . montelukast (SINGULAIR) 10 MG tablet Take 10 mg by mouth daily.    . rosuvastatin (CRESTOR) 10 MG tablet Take 1 tablet (10 mg total) by mouth daily. 90 tablet 1  . sertraline (ZOLOFT) 100 MG tablet Take 50 mg by mouth as needed.    Marland Kitchen Ubrogepant (UBRELVY) 100 MG TABS Take 1 tablet by mouth as needed (May repeat in 2 hours if needed.  Maximum 2 tablets in 24 hours.). 10 tablet 5  . vardenafil (LEVITRA) 20 MG tablet daily as needed.     No current facility-administered medications on file prior to visit.    Cardiovascular studies:  EKG 07/13/2020: Sinus rhythm 77 bpm Frequent ectopic ventricular beats  Anteroseptal infarct -  age undetermined Compared to previous EKG on 10/15/2019, PVC's are new Otherwise, no significant change noted  EKG 10/15/2019: Sinus rhythm 64 bpm IVCD. Poor R-wave progression Cannot exclude old anteroseptal infarct  Echocardiogram 12/29/2018:  1. The left ventricle has mildly reduced systolic function, with an ejection fraction of 45-50%. The cavity size was normal. Left ventricular diastolic parameters were normal. There is abnormal septal motion consistent with post-operative status.  2. The right ventricle has normal systolc function. There is no increase in right ventricular wall thickness.  3. Trivial circumferential  pericardial effusion is present.  4. A 64m an Edwards bovine bioprosthesis valve is present in the aortic position. Procedure Date: 12/16/2018 Normal aortic valve prosthesis. Mean PG 13 mmHg, which is likely normal. No regurgitation.  5. The ascending aorta is normal in size and structure.  6. Compared to previous transthoracic study on 08/27/2018, bioprosthetic aortic valve is new.  Coronary angiogram 10/14/2018: LM: Normal LAD: Normal Ramus: Normal LCx: Normal RCA: Normal  Angiographically normal coronary arteries with no significant CAD Mild pulmonary hypertnension mean PA 26 mmHg  CTA coronary 08/27/2018: 1. No aortic dissection. 2. Inadequate coaptation of the aortic valve leaflets consistent with severe aortic insufficiency. 3. Coronary artery calcium score 37 Agatston units. This places the patient in the 50th percentile for age and gender, suggesting intermediate risk for future cardiac events. 4. Nonobstructive mild coronary disease. Interpretation of the RCA is somewhat limited by artifact but I think that there is no significant disease.   Recent labs: 02/2019: Glucose 155. BUN/Cr 15/1.47. eGFR 51 H/H 9.1/30.1, MCV 80. Platelets 226 HbA1C 5.5%  08/2018: Chol 225, TG 90, HDL 78, LDL 129   Review of Systems  Cardiovascular: Negative for chest pain, dyspnea on exertion, leg swelling, palpitations and syncope.  Musculoskeletal: Positive for back pain, joint pain and neck pain.  Neurological: Positive for paresthesias.        Vitals:   07/13/20 1023  BP: 121/81  Pulse: 70  Resp: 16  Temp: (!) 97.2 F (36.2 C)  SpO2: 96%    Physical Exam Vitals and nursing note reviewed.  Constitutional:      General: He is not in acute distress. Neck:     Vascular: No JVD.  Cardiovascular:     Rate and Rhythm: Normal rate and regular rhythm.     Pulses: Intact distal pulses.     Heart sounds: Normal heart sounds. No murmur heard.   Pulmonary:     Effort:  Pulmonary effort is normal.     Breath sounds: Normal breath sounds. No wheezing or rales.  Musculoskeletal:        General: Tenderness (Right shoulder tenderness) present.     Right lower leg: No edema.     Left lower leg: No edema.  Neurological:     General: No focal deficit present.     Cranial Nerves: No cranial nerve deficit.     Sensory: No sensory deficit.         Assessment & Recommendations:   65year old African-American male, 352PY former smoker, OSA, LBBB, h/o asthma, s/p bioprosthetic AVR (25 mm) through minithoracotomy (12/16/2018 Dr ORoxy Manns for severe AI with quadricuspid aortic valve.   Right shoulder pain, finger numbness: Suspect musculoskeletal etiology.  Other than finger numbness, no exam signs of neuro deficit.  I do not think his symptoms are related to stroke.  I asked him to continue taking muscle relaxant, and take Advil for next few days.  I encouraged him to touch  base with his primary care physician.  S/p AVR: 25 mm bioprosthetic valve for quadricuspid valve with severe AI Normal functioning on echocardiogram 12/2018. Continue Aspirin 81 mg daily.   Hypertension: Well controlled  Keep appointment in 09/2020.  Nigel Mormon, MD [facility] Pager: [phone] Office: [phone] If no answer Cell [phone]

## 2020-07-20 ENCOUNTER — Telehealth: Payer: Self-pay | Admitting: Neurology

## 2020-07-20 NOTE — Telephone Encounter (Signed)
Patient called in stating not long after his last visit about 3 weeks ago he had some chest pain and shooting pain and numbness down his right arm. He thought it was possibly a heart attack so he saw his Cardiologist. They said it was not a heart attack or stroke and to see his PCP. He did and his PCP said it was a pinched nerve and to see Dr. Tomi Likens. The patient is still having numbness and tingling in his hand and comes and goes depending on how he lays or sits. He said he is "struggling" with this.

## 2020-07-21 NOTE — Telephone Encounter (Signed)
This is a new issue.  He needs to make an appointment

## 2020-07-21 NOTE — Telephone Encounter (Signed)
Pt advised. Transferred to the front desk to schedule.

## 2020-08-09 ENCOUNTER — Other Ambulatory Visit: Payer: Self-pay

## 2020-08-09 ENCOUNTER — Other Ambulatory Visit: Payer: Self-pay | Admitting: Cardiology

## 2020-08-09 ENCOUNTER — Telehealth: Payer: Self-pay

## 2020-08-09 DIAGNOSIS — R600 Localized edema: Secondary | ICD-10-CM

## 2020-08-09 MED ORDER — FUROSEMIDE 20 MG PO TABS: 20.0000 mg | ORAL_TABLET | Freq: Every day | ORAL | 0 refills | Status: DC

## 2020-08-09 NOTE — Telephone Encounter (Signed)
Recommend lasix 20 mg daily 30 pills Ordered echocardiogram Keep weight log Keep upcoming f/u  Thanks MJP

## 2020-08-09 NOTE — Telephone Encounter (Signed)
Spoke to patient sent in rx

## 2020-08-09 NOTE — Telephone Encounter (Signed)
Patient called that he believes he is resisting fluids patient was 254lb and is 271Ib now please advise

## 2020-08-12 ENCOUNTER — Encounter: Payer: Self-pay | Admitting: Neurology

## 2020-08-12 NOTE — Progress Notes (Signed)
Charles Riggs (Key: [id number]) Rx #: [id number] [name] '100MG'$  tablets   Form [employer] Medication Request Form  Plan Contact [phone] phone [phone] fax Created 8 days ago Sent to Plan 8 days ago Determination Favorable 15 minutes ago

## 2020-08-23 NOTE — Progress Notes (Deleted)
NEUROLOGY FOLLOW UP OFFICE NOTE  Charles Riggs ER:[id number]  Assessment/Plan:   ***  Subjective:  Charles Riggs. Is a 85 year oldleft-handed male with CHF, CKD, HTN, and OSA who I see for migraines presents today for pain and numbness radiating down right arm.  Shortly after his follow up on 07/01/2020, he had an episode of chest pain with shooting pain and numbness radiating down his right arm.  He saw his cardiologist and MI and stroke reportedly ruled out.  He followed up with his PCP who said it was due to a pinched nerve in his neck and to follow up with me.  Thus far, he has not received treatment.    PAST MEDICAL HISTORY: Past Medical History:  Diagnosis Date  . Acute on chronic diastolic heart failure ([place])   . Anemia in chronic kidney disease   . Anxiety   . Aortic insufficiency   . Arthritis   . Asthma   . CHF (congestive heart failure) ([place])   . CKD (chronic kidney disease)   . Dyspnea   . Essential hypertension   . Nerve pain   . Non-rheumatic aortic regurgitation   . Obstructive sleep apnea    cpap  . Quadricuspid aortic valve   . S/P minimally invasive aortic valve replacement with bioprosthetic valve 12/16/2018   25 mm Edwards Inspiris Resilia stented bovine pericardial tissue valve via right mini thoracotomy approach  . Severe aortic insufficiency   . SOB (shortness of breath)   . Syncope 12/2018    MEDICATIONS: Current Outpatient Medications on File Prior to Visit  Medication Sig Dispense Refill  . albuterol (PROVENTIL HFA;VENTOLIN HFA) 108 (90 Base) MCG/ACT inhaler Inhale 2 puffs into the lungs every 6 (six) hours as needed for wheezing or shortness of breath.    Marland Kitchen albuterol (PROVENTIL) (2.5 MG/3ML) 0.083% nebulizer solution Take 3 mLs (2.5 mg total) by nebulization every 6 (six) hours as needed for wheezing or shortness of breath. 75 mL 12  . amLODipine (NORVASC) 10 MG tablet Take 10 mg by mouth daily.    Marland Kitchen aspirin EC 81 MG tablet Take 1  tablet (81 mg total) by mouth daily. 90 tablet 1  . carvedilol (COREG) 6.25 MG tablet TAKE ONE TABLET BY MOUTH TWICE A DAY 180 tablet 3  . Cholecalciferol (VITAMIN D3) 50 MCG (2000 UT) TABS Take 2,000 Units by mouth 2 (two) times daily.    Marland Kitchen dextromethorphan-guaiFENesin (MUCINEX DM) 30-600 MG 12hr tablet Take 1 tablet by mouth 2 (two) times daily.    Eduard Roux (AIMOVIG) 70 MG/ML SOAJ Inject 70 mg into the skin every 28 (twenty-eight) days. 1 pen 11  . fluticasone (FLONASE) 50 MCG/ACT nasal spray Place 2 sprays into both nostrils daily as needed for allergies or rhinitis.    . Fluticasone Furoate (ARNUITY ELLIPTA) 50 MCG/ACT AEPB in the morning and at bedtime.    . Fluticasone-Salmeterol (ADVAIR) 250-50 MCG/DOSE AEPB INHALE 1 INHALATION TWICE A DAY (RINSE MOUTH WELL WITH WATER AFTER EACH USE (CONVERTED FROM SYMBICORT. NOTE NEW DIRECTIONS))    . furosemide (LASIX) 20 MG tablet Take 1 tablet (20 mg total) by mouth daily. 30 tablet 0  . gabapentin (NEURONTIN) 400 MG capsule Take 1 capsule by mouth 3 (three) times daily.    . hydrOXYzine (VISTARIL) 25 MG capsule Take 25 mg by mouth 3 (three) times daily.    Marland Kitchen losartan (COZAAR) 50 MG tablet Take 50 mg by mouth daily.    . methocarbamol (  ROBAXIN) 750 MG tablet Take 1 tablet (750 mg total) by mouth 2 (two) times daily as needed for muscle spasms. 60 tablet 0  . metoprolol tartrate (LOPRESSOR) 50 MG tablet Take 50 mg by mouth daily.    . montelukast (SINGULAIR) 10 MG tablet Take 10 mg by mouth daily.    . rosuvastatin (CRESTOR) 10 MG tablet Take 1 tablet (10 mg total) by mouth daily. 90 tablet 1  . sertraline (ZOLOFT) 100 MG tablet Take 50 mg by mouth as needed.    Marland Kitchen Ubrogepant (UBRELVY) 100 MG TABS Take 1 tablet by mouth as needed (May repeat in 2 hours if needed.  Maximum 2 tablets in 24 hours.). 10 tablet 5  . vardenafil (LEVITRA) 20 MG tablet daily as needed.     No current facility-administered medications on file prior to visit.     ALLERGIES: No Known Allergies  FAMILY HISTORY: Family History  Problem Relation Age of Onset  . COPD Father   . Cancer Brother       Objective:  *** General: No acute distress.  Patient appears ***-groomed.   Head:  Normocephalic/atraumatic Eyes:  Fundi examined but not visualized Neck: supple, no paraspinal tenderness, full range of motion Heart:  Regular rate and rhythm Lungs:  Clear to auscultation bilaterally Back: No paraspinal tenderness Neurological Exam: alert and oriented to person, place, and time. Attention span and concentration intact, recent and remote memory intact, fund of knowledge intact.  Speech fluent and not dysarthric, language intact.  CN II-XII intact. Bulk and tone normal, muscle strength 5/5 throughout.  Sensation to light touch, temperature and vibration intact.  Deep tendon reflexes 2+ throughout, toes downgoing.  Finger to nose and heel to shin testing intact.  Gait normal, Romberg negative.     Metta Clines, DO

## 2020-08-24 ENCOUNTER — Ambulatory Visit: Payer: No Typology Code available for payment source | Admitting: Neurology

## 2020-09-05 ENCOUNTER — Telehealth: Payer: Self-pay

## 2020-09-05 ENCOUNTER — Telehealth: Payer: Self-pay | Admitting: *Deleted

## 2020-09-05 NOTE — Telephone Encounter (Signed)
Next available appt is okay  Thanks MJP

## 2020-09-05 NOTE — Telephone Encounter (Signed)
Patient contacted the office stating he has been gaining weight and is SOB of lately. Patient states he has been feeling the way he was when he "had fluid on his lungs". Advised patient to contact his cardiologist for further workup. Patient verbalized understanding. No other questions.

## 2020-09-05 NOTE — Telephone Encounter (Signed)
Patient stated that he valve replacement and was told if he ever had any issues or concerns. Patient symptoms currently are shortness of breath, unexplained weight gain and tired for no reason at all. Does he need to be seen ASAP?  [phone]

## 2020-09-06 NOTE — Telephone Encounter (Signed)
Can you call patient and schedule an appointment to see Dr. Virgina Jock? Next available appointment is fine. Thank you.

## 2020-09-14 NOTE — Telephone Encounter (Signed)
Either that or first wee May okay with me.  Thanks MJP

## 2020-09-15 ENCOUNTER — Telehealth: Payer: Self-pay | Admitting: Orthopaedic Surgery

## 2020-09-15 NOTE — Telephone Encounter (Signed)
Received vm from [place] w/ [facility], checking on request for records. IC,lmvm advised last request we received dated 1/3 and processed by [employer] on 1/7. Advised there are no new dates of service after the request was processed. Left phone number to call [employer] [phone]. Her ph [phone] ext [place] office

## 2020-09-28 ENCOUNTER — Encounter: Payer: Self-pay | Admitting: Cardiology

## 2020-09-28 ENCOUNTER — Other Ambulatory Visit: Payer: Self-pay

## 2020-09-28 ENCOUNTER — Ambulatory Visit: Payer: No Typology Code available for payment source | Admitting: Cardiology

## 2020-09-28 VITALS — BP 102/73 | HR 75 | Temp 98.0°F | Resp 17 | Ht 71.0 in | Wt 263.0 lb

## 2020-09-28 DIAGNOSIS — I10 Essential (primary) hypertension: Secondary | ICD-10-CM

## 2020-09-28 DIAGNOSIS — Z952 Presence of prosthetic heart valve: Secondary | ICD-10-CM

## 2020-09-28 NOTE — Progress Notes (Signed)
Subjective:   Charles Setters., male    DOB: 07/16/1955, 65 y.o.   MRN: [id number]   Chief complaint:  Aortic regurgitation   65 year old African-American male, 12 PY former smoker, OSA, LBBB, h/o asthma, nonobstructive CAD, s/p bioprosthetic AVR (25 mm) through minithoracotomy (12/16/2018 Dr Roxy Manns) for severe AI with quadricuspid aortic valve.   Patient continues to have right arm pain and tingling, denies chest pain, shortness of breath, palpitations, leg edema, orthopnea, PND, TIA/syncope. On a separate note, he has noticed "bulding" on right side of chest at the site of his prior surgical scar.    Current Outpatient Medications on File Prior to Visit  Medication Sig Dispense Refill  . acetaminophen (TYLENOL) 500 MG tablet as needed.    Marland Kitchen albuterol (PROVENTIL HFA;VENTOLIN HFA) 108 (90 Base) MCG/ACT inhaler Inhale 2 puffs into the lungs every 6 (six) hours as needed for wheezing or shortness of breath.    Marland Kitchen albuterol (PROVENTIL) (2.5 MG/3ML) 0.083% nebulizer solution Take 3 mLs (2.5 mg total) by nebulization every 6 (six) hours as needed for wheezing or shortness of breath. 75 mL 12  . amLODipine (NORVASC) 10 MG tablet Take 10 mg by mouth daily.    Marland Kitchen aspirin EC 81 MG tablet Take 1 tablet (81 mg total) by mouth daily. 90 tablet 1  . carvedilol (COREG) 6.25 MG tablet TAKE ONE TABLET BY MOUTH TWICE A DAY 180 tablet 3  . Cholecalciferol (VITAMIN D3) 50 MCG (2000 UT) TABS Take 2,000 Units by mouth 2 (two) times daily.    Marland Kitchen dextromethorphan-guaiFENesin (MUCINEX DM) 30-600 MG 12hr tablet Take 1 tablet by mouth 2 (two) times daily.    Eduard Roux (AIMOVIG) 70 MG/ML SOAJ Inject 70 mg into the skin every 28 (twenty-eight) days. 1 pen 11  . fluticasone (FLONASE) 50 MCG/ACT nasal spray Place 2 sprays into both nostrils daily as needed for allergies or rhinitis.    . Fluticasone Furoate (ARNUITY ELLIPTA) 50 MCG/ACT AEPB in the morning and at bedtime.    . Fluticasone-Salmeterol (ADVAIR)  250-50 MCG/DOSE AEPB INHALE 1 INHALATION TWICE A DAY (RINSE MOUTH WELL WITH WATER AFTER EACH USE (CONVERTED FROM SYMBICORT. NOTE NEW DIRECTIONS))    . furosemide (LASIX) 20 MG tablet Take 1 tablet (20 mg total) by mouth daily. 30 tablet 0  . hydrOXYzine (VISTARIL) 25 MG capsule Take 25 mg by mouth 3 (three) times daily.    Marland Kitchen losartan (COZAAR) 50 MG tablet Take 50 mg by mouth daily.    . methocarbamol (ROBAXIN) 750 MG tablet Take 1 tablet (750 mg total) by mouth 2 (two) times daily as needed for muscle spasms. 60 tablet 0  . metoprolol tartrate (LOPRESSOR) 50 MG tablet Take 50 mg by mouth daily.    . montelukast (SINGULAIR) 10 MG tablet Take 10 mg by mouth daily.    . rosuvastatin (CRESTOR) 10 MG tablet Take 1 tablet (10 mg total) by mouth daily. 90 tablet 1  . sertraline (ZOLOFT) 100 MG tablet Take 50 mg by mouth as needed.    . SUMAtriptan (IMITREX) 50 MG tablet as needed.    Marland Kitchen Ubrogepant (UBRELVY) 100 MG TABS Take 1 tablet by mouth as needed (May repeat in 2 hours if needed.  Maximum 2 tablets in 24 hours.). 10 tablet 5  . vardenafil (LEVITRA) 20 MG tablet daily as needed.     No current facility-administered medications on file prior to visit.    Cardiovascular studies:  EKG 07/13/2020: Sinus rhythm 77 bpm  Frequent ectopic ventricular beats  Anteroseptal infarct -age undetermined Compared to previous EKG on 10/15/2019, PVC's are new Otherwise, no significant change noted  EKG 10/15/2019: Sinus rhythm 64 bpm IVCD. Poor R-wave progression Cannot exclude old anteroseptal infarct  Echocardiogram 12/29/2018:  1. The left ventricle has mildly reduced systolic function, with an ejection fraction of 45-50%. The cavity size was normal. Left ventricular diastolic parameters were normal. There is abnormal septal motion consistent with post-operative status.  2. The right ventricle has normal systolc function. There is no increase in right ventricular wall thickness.  3. Trivial circumferential  pericardial effusion is present.  4. A 45m an Edwards bovine bioprosthesis valve is present in the aortic position. Procedure Date: 12/16/2018 Normal aortic valve prosthesis. Mean PG 13 mmHg, which is likely normal. No regurgitation.  5. The ascending aorta is normal in size and structure.  6. Compared to previous transthoracic study on 08/27/2018, bioprosthetic aortic valve is new.  Coronary angiogram 10/14/2018: LM: Normal LAD: Normal Ramus: Normal LCx: Normal RCA: Normal  Angiographically normal coronary arteries with no significant CAD Mild pulmonary hypertnension mean PA 26 mmHg  CTA coronary 08/27/2018: 1. No aortic dissection. 2. Inadequate coaptation of the aortic valve leaflets consistent with severe aortic insufficiency. 3. Coronary artery calcium score 37 Agatston units. This places the patient in the 50th percentile for age and gender, suggesting intermediate risk for future cardiac events. 4. Nonobstructive mild coronary disease. Interpretation of the RCA is somewhat limited by artifact but I think that there is no significant disease.   Recent labs: 02/2019: Glucose 155. BUN/Cr 15/1.47. eGFR 51 H/H 9.1/30.1, MCV 80. Platelets 226 HbA1C 5.5%  08/2018: Chol 225, TG 90, HDL 78, LDL 129   Review of Systems  Cardiovascular: Negative for chest pain, dyspnea on exertion, leg swelling, palpitations and syncope.  Respiratory: Negative for shortness of breath.   Musculoskeletal: Positive for back pain, joint pain and neck pain.  Neurological: Positive for paresthesias.        Vitals:   09/28/20 1458  BP: 102/73  Pulse: 75  Resp: 17  Temp: 98 F (36.7 C)  SpO2: 97%    Physical Exam Vitals and nursing note reviewed.  Constitutional:      General: He is not in acute distress. Neck:     Vascular: No JVD.  Cardiovascular:     Rate and Rhythm: Normal rate and regular rhythm.     Pulses: Intact distal pulses.     Heart sounds: Normal heart sounds. No  murmur heard.   Pulmonary:     Effort: Pulmonary effort is normal.     Breath sounds: Normal breath sounds. No wheezing or rales.  Chest:     Comments: Right sided herniation of ?pectoral muscle at the site of mini-thoracotomy scar Musculoskeletal:        General: Tenderness (Right shoulder tenderness) present.     Right lower leg: No edema.     Left lower leg: No edema.  Neurological:     General: No focal deficit present.     Cranial Nerves: No cranial nerve deficit.     Sensory: No sensory deficit.         Assessment & Recommendations:   65year old African-American male, 354PY former smoker, OSA, LBBB, h/o asthma, s/p bioprosthetic AVR (25 mm) through minithoracotomy (12/16/2018 Dr ORoxy Manns for severe AI with quadricuspid aortic valve.   Abnormal bulge at sternotomy scar: Suspect there is some ?pectoral muscle (less likely lung) herniation at the site of his  mini thoracotomy scar. This is prominent on coughing (not as appreciated on still image below). I will check with our surgical colleagues if they have any recommendations.            Right shoulder pain, finger numbness: Suspect musculoskeletal or neurological etiology.  S/p AVR: 25 mm bioprosthetic valve for quadricuspid valve with severe AI Normal functioning on echocardiogram 12/2018. Continue Aspirin 81 mg daily.   Hypertension: Well controlled Switch metoprolol to succinate for east of administration.  F/u in 6 months  Tziporah Knoke Esther Hardy, MD [facility] Pager: [phone] Office: [phone] If no answer Cell [phone]

## 2020-10-07 ENCOUNTER — Ambulatory Visit: Payer: No Typology Code available for payment source

## 2020-10-07 ENCOUNTER — Other Ambulatory Visit: Payer: Self-pay

## 2020-10-07 DIAGNOSIS — R600 Localized edema: Secondary | ICD-10-CM

## 2020-10-14 ENCOUNTER — Ambulatory Visit: Payer: No Typology Code available for payment source | Admitting: Cardiology

## 2020-11-18 ENCOUNTER — Emergency Department (HOSPITAL_COMMUNITY)

## 2020-11-18 ENCOUNTER — Encounter (HOSPITAL_COMMUNITY): Payer: Self-pay | Admitting: Emergency Medicine

## 2020-11-18 ENCOUNTER — Other Ambulatory Visit: Payer: Self-pay

## 2020-11-18 ENCOUNTER — Emergency Department (HOSPITAL_COMMUNITY)
Admission: EM | Admit: 2020-11-18 | Discharge: 2020-11-18 | Disposition: A | Discharge status: Home / Self Care | Attending: Emergency Medicine | Admitting: Emergency Medicine

## 2020-11-18 DIAGNOSIS — N189 Chronic kidney disease, unspecified: Secondary | ICD-10-CM | POA: Diagnosis not present

## 2020-11-18 DIAGNOSIS — Z79899 Other long term (current) drug therapy: Secondary | ICD-10-CM | POA: Diagnosis not present

## 2020-11-18 DIAGNOSIS — Z7982 Long term (current) use of aspirin: Secondary | ICD-10-CM | POA: Diagnosis not present

## 2020-11-18 DIAGNOSIS — Y92410 Unspecified street and highway as the place of occurrence of the external cause: Secondary | ICD-10-CM | POA: Diagnosis not present

## 2020-11-18 DIAGNOSIS — S161XXA Strain of muscle, fascia and tendon at neck level, initial encounter: Secondary | ICD-10-CM | POA: Diagnosis not present

## 2020-11-18 DIAGNOSIS — M7918 Myalgia, other site: Secondary | ICD-10-CM

## 2020-11-18 DIAGNOSIS — R519 Headache, unspecified: Secondary | ICD-10-CM | POA: Insufficient documentation

## 2020-11-18 DIAGNOSIS — J45909 Unspecified asthma, uncomplicated: Secondary | ICD-10-CM | POA: Diagnosis not present

## 2020-11-18 DIAGNOSIS — I5033 Acute on chronic diastolic (congestive) heart failure: Secondary | ICD-10-CM | POA: Diagnosis not present

## 2020-11-18 DIAGNOSIS — D631 Anemia in chronic kidney disease: Secondary | ICD-10-CM | POA: Diagnosis not present

## 2020-11-18 DIAGNOSIS — I130 Hypertensive heart and chronic kidney disease with heart failure and stage 1 through stage 4 chronic kidney disease, or unspecified chronic kidney disease: Secondary | ICD-10-CM | POA: Insufficient documentation

## 2020-11-18 DIAGNOSIS — Z87891 Personal history of nicotine dependence: Secondary | ICD-10-CM | POA: Insufficient documentation

## 2020-11-18 DIAGNOSIS — Z96642 Presence of left artificial hip joint: Secondary | ICD-10-CM | POA: Diagnosis not present

## 2020-11-18 DIAGNOSIS — M79605 Pain in left leg: Secondary | ICD-10-CM | POA: Insufficient documentation

## 2020-11-18 DIAGNOSIS — R42 Dizziness and giddiness: Secondary | ICD-10-CM | POA: Diagnosis not present

## 2020-11-18 DIAGNOSIS — S199XXA Unspecified injury of neck, initial encounter: Secondary | ICD-10-CM | POA: Diagnosis present

## 2020-11-18 DIAGNOSIS — I2510 Atherosclerotic heart disease of native coronary artery without angina pectoris: Secondary | ICD-10-CM | POA: Diagnosis not present

## 2020-11-18 MED ORDER — CYCLOBENZAPRINE HCL 10 MG PO TABS
10.0000 mg | ORAL_TABLET | Freq: Once | ORAL | Status: AC
  Administered 2020-11-18: 10 mg via ORAL
  Filled 2020-11-18: qty 1

## 2020-11-18 MED ORDER — ACETAMINOPHEN 500 MG PO TABS
1000.0000 mg | ORAL_TABLET | Freq: Once | ORAL | Status: AC
  Administered 2020-11-18: 1000 mg via ORAL
  Filled 2020-11-18: qty 2

## 2020-11-18 MED ORDER — KETOROLAC TROMETHAMINE 15 MG/ML IJ SOLN
15.0000 mg | Freq: Once | INTRAMUSCULAR | Status: AC
  Administered 2020-11-18: 15 mg via INTRAVENOUS
  Filled 2020-11-18: qty 1
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
## 2020-11-18 NOTE — Discharge Instructions (Addendum)
The imaging of your brain did not show any evidence of brain injury or fractures to your skull. The x-rays of your left leg were also negative for fracture or other injuries.  I recommend taking Tylenol and Motrin as needed for pain.  I also included information for RICE therapy which can help with musculoskeletal aches and pains.
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
## 2020-11-24 ENCOUNTER — Telehealth: Payer: Self-pay | Admitting: Neurology

## 2020-11-24 MED ORDER — UBRELVY 100 MG PO TABS: 1.0000 | ORAL_TABLET | ORAL | 1 refills | Status: DC | PRN

## 2020-11-24 NOTE — Telephone Encounter (Signed)
Pt called in stating he changed insurances and now the [name] is over $1,000. He called the [facility] [place] and they can dispense it, but they need a prescription sent to them for it.

## 2020-12-01 ENCOUNTER — Telehealth: Payer: Self-pay | Admitting: Neurology

## 2020-12-01 NOTE — Telephone Encounter (Signed)
Charles Riggs is following up with his message he left 11/24/20. He would like a call back [phone]

## 2020-12-02 NOTE — Telephone Encounter (Signed)
I called patient 

## 2020-12-15 ENCOUNTER — Other Ambulatory Visit: Payer: Self-pay | Admitting: Cardiology

## 2020-12-15 DIAGNOSIS — I10 Essential (primary) hypertension: Secondary | ICD-10-CM

## 2020-12-19 NOTE — Progress Notes (Unsigned)
Contact plan to follow up on [id number] , sent request to cover my meds  Patient has no insurance not active, sent rx to [facility] in [place] per patients request.

## 2020-12-20 ENCOUNTER — Other Ambulatory Visit: Payer: Self-pay

## 2020-12-20 MED ORDER — UBRELVY 100 MG PO TABS: 1.0000 | ORAL_TABLET | ORAL | 1 refills | Status: DC | PRN

## 2020-12-22 NOTE — Progress Notes (Unsigned)
Recieived denial from the [place], sent clinicals to [facility].

## 2020-12-23 NOTE — Telephone Encounter (Signed)
Charles Riggs at the [facility], downloaded [facility] Provider Request for Services form.  The [facility] has [facility]. He has a visit on 12/28/20 with Dr. Tomi Likens. [facility] has opened the case for continuation of care but form will need completed and faxed to: [phone].   Be sure to include the most recent progress note with the fax.  Form and access nurse message placed in PA box.

## 2020-12-27 NOTE — Progress Notes (Signed)
Patient did not respond to text x 2

## 2020-12-27 NOTE — Telephone Encounter (Signed)
Forms filled out waiting on signature. Will fax back by end of day.

## 2020-12-27 NOTE — Telephone Encounter (Signed)
Forms faxed to [phone]

## 2020-12-28 ENCOUNTER — Telehealth (INDEPENDENT_AMBULATORY_CARE_PROVIDER_SITE_OTHER): Payer: No Typology Code available for payment source | Admitting: Neurology

## 2020-12-28 ENCOUNTER — Other Ambulatory Visit: Payer: Self-pay

## 2020-12-28 DIAGNOSIS — G43009 Migraine without aura, not intractable, without status migrainosus: Secondary | ICD-10-CM

## 2021-01-04 ENCOUNTER — Ambulatory Visit: Payer: No Typology Code available for payment source | Admitting: Neurology

## 2021-01-23 ENCOUNTER — Telehealth (INDEPENDENT_AMBULATORY_CARE_PROVIDER_SITE_OTHER): Payer: No Typology Code available for payment source | Admitting: Neurology

## 2021-01-23 ENCOUNTER — Other Ambulatory Visit: Payer: Self-pay

## 2021-01-23 ENCOUNTER — Encounter: Payer: Self-pay | Admitting: Neurology

## 2021-01-23 VITALS — Wt 260.0 lb

## 2021-01-23 DIAGNOSIS — G43009 Migraine without aura, not intractable, without status migrainosus: Secondary | ICD-10-CM | POA: Diagnosis not present

## 2021-01-23 MED ORDER — QULIPTA 60 MG PO TABS: 60.0000 mg | ORAL_TABLET | Freq: Every day | ORAL | 5 refills | Status: DC

## 2021-01-23 NOTE — Progress Notes (Signed)
Virtual Visit via Video Note The purpose of this virtual visit is to provide medical care while limiting exposure to the novel coronavirus.    Consent was obtained for video visit:  Yes.   Answered questions that patient had about telehealth interaction:  Yes.   I discussed the limitations, risks, security and privacy concerns of performing an evaluation and management service by telemedicine. I also discussed with the patient that there may be a patient responsible charge related to this service. The patient expressed understanding and agreed to proceed.  Pt location: Home Physician Location: office Name of referring provider:  [facility] connected with Charles Riggs. at patients initiation/request on 01/23/2021 at  8:30 AM EDT by video enabled telemedicine application and verified that I am speaking with the correct person using two identifiers. Pt MRN:  [id number] Pt DOB:  [birth year] Video Participants:  Charles Riggs.  Assessment and Plan:   1. Migraine without aura, without status migrainosus, not intratable 2. OSA  1. Migraine prevention:  He wants to avoid needles, so will start Qulipta '60mg'$  daily 2. Migraine rescue:  Ubrelvy '100mg'$  with naproxen '500mg'$  (or Tylenol) 3. Limit use of pain relievers to no more than 2 days out of week to prevent risk of rebound or medication-overuse headache. 4. Keep headache diary 5.   Sleep hygiene 6. Follow up 6 months.  History of Present Illness:  Charles Riggs. Is a 65 year old left-handed male with CHF, CKD, HTN, and OSA who follows up for migraines.   UPDATE: Stopped Aimovig in February as headaches improved after occipital nerve block. Takes 2 Tylenols with the [place] as [name] alone not as effective. Intensity:  sevee Duration:  usually 30 minutes later starts feeling relief Frequency:  3 days a week Increased stress may be contributing to the headaches.  Current NSAIDS:  ASA '81mg'$  daily Current analgesics:   Acetaminophen '500mg'$  Current triptans:  Sumatriptan '100mg'$  Current ergotamine:  none Current anti-emetic:  none Current muscle relaxants:  Robaxin Current anti-anxiolytic:  none Current sleep aide:  none Current Antihypertensive medications:  Amlodipine, carvedilol Current Antidepressant medications:  none Current Anticonvulsant medications:  none Current anti-CGRP:  Ubrelvy '100mg'$  Current Vitamins/Herbal/Supplements:  Ferrous sulfate Current Antihistamines/Decongestants:  none Other therapy:  none Hormone/birth control:  none   Caffeine:  Rarely drinks coffee or soda with caffeine Diet:  Drinks 24 oz water daily, otherwise juice. Exercise:  yes Depression:  yes; Anxiety:  yes Other pain:  Low back pain (lumbar stenosis), bilateral hip pain Sleep hygiene:  Poor even with CPAP.   HISTORY:  He has had headaches for several years.  He reports a moderate non-throbbing frontal pain.  They are associated with photophobia, phonophobia, nausea, blurred vision and eyes hurt.  No associated vomiting or weakness. It often lasts all day and occurs 3 to 4 times a week.  He treats with Tylenol '500mg'$  with mild efficacy.  He has chronic hip, back and leg pain (arthritis, lumbar stenosis, plantar fasciitis) which may trigger the headache.    Headaches later became throbbing and bitemporal     Past NSAIDS:  meloxicam Past analgesics:  oxycodone Past abortive triptans:  Has tried a triptan Past abortive ergotamine:  none Past muscle relaxants:  methocarbamol Past anti-emetic:  Ondansetron '4mg'$  Past antihypertensive medications:  metoprolol, furosemide Past antidepressant medications:  Sertraline '100mg'$ , amitriptyline Past anticonvulsant medications:  Gabapentin '400mg'$  TID, topiramate, maybe Depakote Past anti-CGRP:  Aimovig '70mg'$  Past vitamins/Herbal/Supplements:  none Past  antihistamines/decongestants:  Diphenhydramine, hydroxyzine Other past therapies:  occipital nerve block (effective)      Family history of headache:  no   MRI of brain without contrast on 12/17/2002 reportedly showed "no brain abnormality".  CT Head from 11/22/2007 and 07/17/2016 were personally reviewed and were unremarkable.  Past Medical History: Past Medical History:  Diagnosis Date   Acute on chronic diastolic heart failure (HCC)    Anemia in chronic kidney disease    Anxiety    Aortic insufficiency    Arthritis    Asthma    CHF (congestive heart failure) (HCC)    CKD (chronic kidney disease)    Dyspnea    Essential hypertension    Nerve pain    Non-rheumatic aortic regurgitation    Obstructive sleep apnea    cpap   Quadricuspid aortic valve    S/P minimally invasive aortic valve replacement with bioprosthetic valve 12/16/2018   25 mm Edwards Inspiris Resilia stented bovine pericardial tissue valve via right mini thoracotomy approach   Severe aortic insufficiency    SOB (shortness of breath)    Syncope 12/2018    Medications: Outpatient Encounter Medications as of 01/23/2021  Medication Sig   acetaminophen (TYLENOL) 500 MG tablet as needed.   albuterol (PROVENTIL HFA;VENTOLIN HFA) 108 (90 Base) MCG/ACT inhaler Inhale 2 puffs into the lungs every 6 (six) hours as needed for wheezing or shortness of breath.   albuterol (PROVENTIL) (2.5 MG/3ML) 0.083% nebulizer solution Take 3 mLs (2.5 mg total) by nebulization every 6 (six) hours as needed for wheezing or shortness of breath.   amLODipine (NORVASC) 10 MG tablet Take 10 mg by mouth daily.   aspirin EC 81 MG tablet Take 1 tablet (81 mg total) by mouth daily.   carvedilol (COREG) 6.25 MG tablet TAKE ONE TABLET BY MOUTH TWICE A DAY   Cholecalciferol (VITAMIN D3) 50 MCG (2000 UT) TABS Take 2,000 Units by mouth 2 (two) times daily.   dextromethorphan-guaiFENesin (MUCINEX DM) 30-600 MG 12hr tablet Take 1 tablet by mouth 2 (two) times daily.   Erenumab-aooe (AIMOVIG) 70 MG/ML SOAJ Inject 70 mg into the skin every 28 (twenty-eight) days. (Patient not  taking: Reported on 12/28/2020)   fluticasone (FLONASE) 50 MCG/ACT nasal spray Place 2 sprays into both nostrils daily as needed for allergies or rhinitis.   Fluticasone Furoate (ARNUITY ELLIPTA) 50 MCG/ACT AEPB in the morning and at bedtime.   Fluticasone-Salmeterol (ADVAIR) 250-50 MCG/DOSE AEPB INHALE 1 INHALATION TWICE A DAY (RINSE MOUTH WELL WITH WATER AFTER EACH USE (CONVERTED FROM SYMBICORT. NOTE NEW DIRECTIONS))   furosemide (LASIX) 20 MG tablet Take 1 tablet (20 mg total) by mouth daily.   hydrOXYzine (VISTARIL) 25 MG capsule Take 25 mg by mouth 3 (three) times daily.   losartan (COZAAR) 50 MG tablet Take 50 mg by mouth daily.   methocarbamol (ROBAXIN) 750 MG tablet Take 1 tablet (750 mg total) by mouth 2 (two) times daily as needed for muscle spasms.   montelukast (SINGULAIR) 10 MG tablet Take 10 mg by mouth daily.   rosuvastatin (CRESTOR) 10 MG tablet Take 1 tablet (10 mg total) by mouth daily.   sertraline (ZOLOFT) 100 MG tablet Take 50 mg by mouth as needed.   SUMAtriptan (IMITREX) 50 MG tablet as needed.   Ubrogepant (UBRELVY) 100 MG TABS Take 1 tablet by mouth as needed (May repeat in 2 hours if needed.  Maximum 2 tablets in 24 hours.).   vardenafil (LEVITRA) 20 MG tablet daily as needed.  No facility-administered encounter medications on file as of 01/23/2021.    Allergies: No Known Allergies  Family History: Family History  Problem Relation Age of Onset   COPD Father    Cancer Brother     Observations/Objective:   Weight 260 lb (117.9 kg). No acute distress.  Alert and oriented.  Speech fluent and not dysarthric.  Language intact.     Follow Up Instructions:    -I discussed the assessment and treatment plan with the patient. The patient was provided an opportunity to ask questions and all were answered. The patient agreed with the plan and demonstrated an understanding of the instructions.   The patient was advised to call back or seek an in-person evaluation if the  symptoms worsen or if the condition fails to improve as anticipated.  Dudley Major, DO

## 2021-01-23 NOTE — Patient Instructions (Signed)
Start Qulipta '60mg'$  daily At earliest onset of headache, take Ubrelvy '100mg'$  with naproxen '500mg'$ .  You may repeat once in 2 hours if needed Limit use of pain relievers to no more than 2 days out of week to prevent risk of rebound or medication-overuse headache. Keep headache diary Follow up 6 months.

## 2021-02-04 IMAGING — CT CT HEAR MORPH WITH CTA COR WITH SCORE WITH CA WITH CONTRAST AND
1 of 2 series · 4 of 12 positions shown, 5 images · non-contrast
Comparison: Chest radiograph 08/27/2018
COMPARISON: Chest radiograph 08/27/2018
COMPARISON: Chest radiograph 08/27/2018

Addendum:
EXAM:
OVER-READ INTERPRETATION  CT CHEST

The following report is an over-read performed by radiologist Dr.
Elarby Zizo [REDACTED] on 08/28/2018. This
over-read does not include interpretation of cardiac or coronary
anatomy or pathology. The coronary calcium score/coronary CTA
interpretation by the cardiologist is attached.
CLINICAL DATA: Chest pain
Cardiac CTA
MEDICATIONS:
Sub lingual nitro. 4mg x 2 and lopressor 20mg IV
TECHNIQUE: The patient was scanned on a Siemens [REDACTED]ice scanner. Gantry
rotation speed was 250 msecs. Collimation was 0.6 mm. A 100 kV
prospective scan was triggered in the ascending thoracic aorta at
35-75% of the R-R interval. Average HR during the scan was 80 bpm.
The 3D data set was interpreted on a dedicated work station using
MPR, MIP and VRT modes. A total of 80cc of contrast was used.

[Series 429: (person_name) · 0.33mm/px · 4 of 6 slices shown, 5 images]
[im 2/6  vessel]
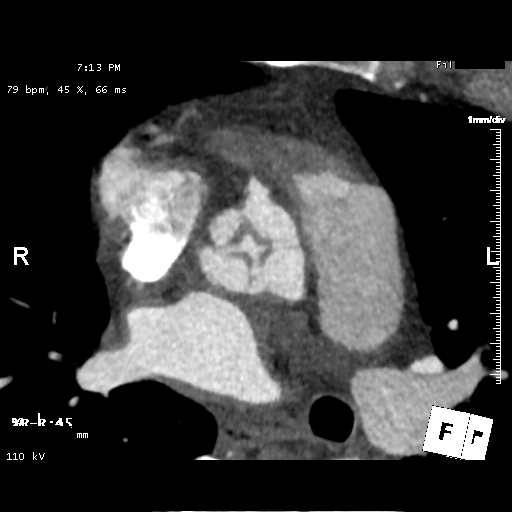
[im 2/6  lung]
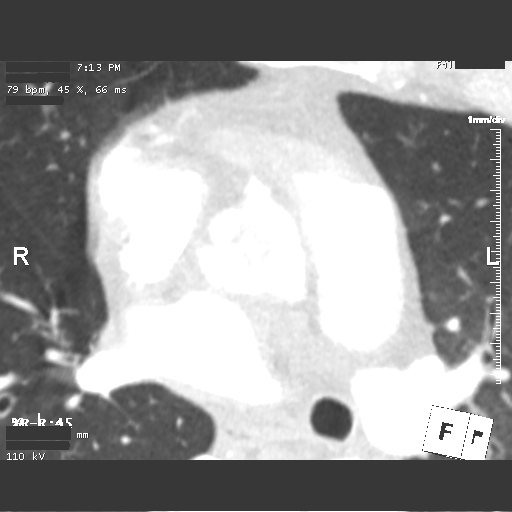
[im 3/6  vessel]
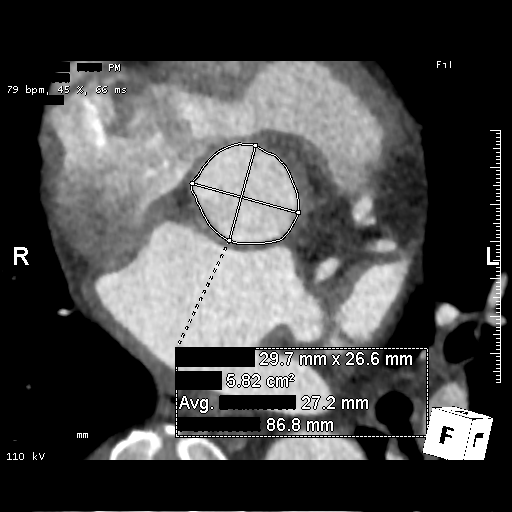
[im 4/6  vessel]
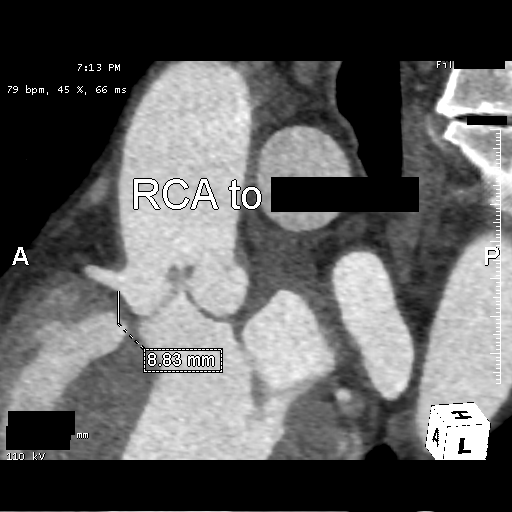
[im 5/6  vessel]
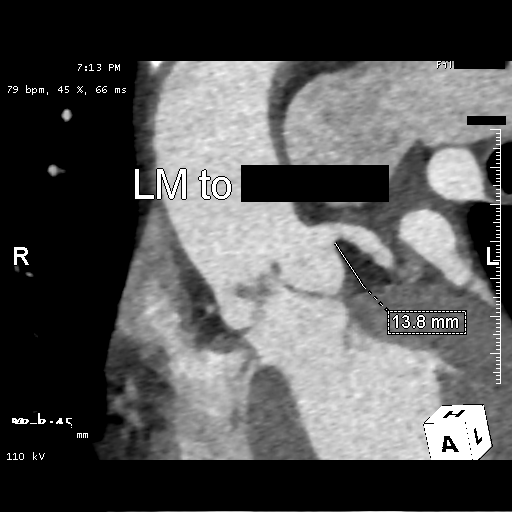

[4 of 12 positions shown; findings below may reference images not displayed]

FINDINGS: Limited view of the lung parenchyma demonstrates mild airspace
disease in the LEFT upper lobe suggesting edema. Airways are
normal.

Limited view of the mediastinum demonstrates no adenopathy.
Esophagus normal.

Limited view of the upper abdomen unremarkable.

Limited view of the skeleton and chest wall is unremarkable.
IMPRESSION: Very mild airspace disease in the LEFT upper lobe suggesting edema.
Findings less conspicuous than comparison radiograph.
FINDINGS: Difficult cardiac images, unable to appropriately lower heart rate
in setting of severe aortic insufficiency, HR was upper 32s-32 at
time of scan.

Non-cardiac: See separate report from [REDACTED].

The pulmonary veins drained normally to the left atrium.

Aortic valve/thoracic aorta: The aortic valve does not have complete
coaptation (consistent with severe aortic insufficiency). No
dissection. Normal origin of the great vessels. The aortic root,
ascending aorta/arch/descending thoracic aorta are not significantly
dilated.

Aorta measurements:

Aortic root at sinuses of valsalva: 31 x 32 x 32 mm

Ascending aorta at greatest diameter: 35 mm

Arch: 31 mm

Descending thoracic aorta: 26 mm

Calcium Score: 37 Agatston units.

Coronary Arteries: Codominant with no anomalies

LM: No plaque or stenosis.

LAD system: Mixed plaque in the proximal LAD with mild (<50%)
stenosis.

Circumflex system: Large vessel, mixed plaque in the proximal LCx
without significant stenosis. Motion artifact affects the mid LCx
but does not affect interpretation.

RCA system: Misregistration and motion artifact affect
interpretation of the mid to distal RCA. However, I suspect there is
no plaque or stenosis.
IMPRESSION: 1.  No aortic dissection.

2. Inadequate coaptation of the aortic valve leaflets consistent
with severe aortic insufficiency.

3. Coronary artery calcium score 37 Agatston units. This places the
patient in the 50th percentile for age and gender, suggesting
intermediate risk for future cardiac events.

4. Nonobstructive mild coronary disease. Interpretation of the RCA
is somewhat limited by artifact but I think that there is no
significant disease.

Gerret Nessa
FINDINGS: Aortic Valve: Quadricuspid aortic valve with limited leaflet
excursions and non-coaptation in the center. The leaflets are
severely thickened but not calcified.

Aorta: Normal size, minimal atherosclerotic plaque, no
calcifications, no dissection.

Sinotubular Junction: 30 x 29 mm

Ascending Thoracic Aorta: 36 x 35 mm

Aortic Arch: 31 x 30 mm

Descending Thoracic Aorta: 26 x 26 mm

Sinus of Valsalva Measurements:

Non-coronary: 36 mm

Right -coronary: 33 mm

Left -coronary: 37 mm

Coronary Artery Height above Annulus:

Left Main: 13.8 mm

Right Coronary: 8.8 mm

Virtual Basal Annulus Measurements:

Maximum/Minimum Diameter: 29.7 x 26.6 mm

Mean Diameter: 27.2 mm

Perimeter: 86.8 mm

Area: 582 mm2
IMPRESSION: 1. Quadricuspid aortic valve with limited leaflet excursions and
non-coaptation in the center. The leaflets are severely thickened
but not calcified.

2. Normal size of the thoracic aorta with minimal atherosclerotic
plaque, no calcifications, no dissection.

3. No thrombus in the left atrial appendage.

4. Dilated pulmonary artery measuring 34 mm suggestive of pulmonary
hypertension.

*** End of Addendum ***
Addendum:
EXAM:
OVER-READ INTERPRETATION  CT CHEST

The following report is an over-read performed by radiologist Dr.
Elarby Zizo [REDACTED] on 08/28/2018. This
over-read does not include interpretation of cardiac or coronary
anatomy or pathology. The coronary calcium score/coronary CTA
interpretation by the cardiologist is attached.
FINDINGS: Limited view of the lung parenchyma demonstrates mild airspace
disease in the LEFT upper lobe suggesting edema. Airways are
normal.

Limited view of the mediastinum demonstrates no adenopathy.
Esophagus normal.

Limited view of the upper abdomen unremarkable.

Limited view of the skeleton and chest wall is unremarkable.
IMPRESSION: Very mild airspace disease in the LEFT upper lobe suggesting edema.
Findings less conspicuous than comparison radiograph.
FINDINGS: Difficult cardiac images, unable to appropriately lower heart rate
in setting of severe aortic insufficiency, HR was upper 32s-32 at
time of scan.

Non-cardiac: See separate report from [REDACTED].

The pulmonary veins drained normally to the left atrium.

Aortic valve/thoracic aorta: The aortic valve does not have complete
coaptation (consistent with severe aortic insufficiency). No
dissection. Normal origin of the great vessels. The aortic root,
ascending aorta/arch/descending thoracic aorta are not significantly
dilated.

Aorta measurements:

Aortic root at sinuses of valsalva: 31 x 32 x 32 mm

Ascending aorta at greatest diameter: 35 mm

Arch: 31 mm

Descending thoracic aorta: 26 mm

Calcium Score: 37 Agatston units.

Coronary Arteries: Codominant with no anomalies

LM: No plaque or stenosis.

LAD system: Mixed plaque in the proximal LAD with mild (<50%)
stenosis.

Circumflex system: Large vessel, mixed plaque in the proximal LCx
without significant stenosis. Motion artifact affects the mid LCx
but does not affect interpretation.

RCA system: Misregistration and motion artifact affect
interpretation of the mid to distal RCA. However, I suspect there is
no plaque or stenosis.
IMPRESSION: 1.  No aortic dissection.

2. Inadequate coaptation of the aortic valve leaflets consistent
with severe aortic insufficiency.

3. Coronary artery calcium score 37 Agatston units. This places the
patient in the 50th percentile for age and gender, suggesting
intermediate risk for future cardiac events.

4. Nonobstructive mild coronary disease. Interpretation of the RCA
is somewhat limited by artifact but I think that there is no
significant disease.

Gerret Nessa

*** End of Addendum ***
EXAM:
OVER-READ INTERPRETATION  CT CHEST

The following report is an over-read performed by radiologist Dr.
Elarby Zizo [REDACTED] on 08/28/2018. This
over-read does not include interpretation of cardiac or coronary
anatomy or pathology. The coronary calcium score/coronary CTA
interpretation by the cardiologist is attached.
FINDINGS: Limited view of the lung parenchyma demonstrates mild airspace
disease in the LEFT upper lobe suggesting edema. Airways are
normal.

Limited view of the mediastinum demonstrates no adenopathy.
Esophagus normal.

Limited view of the upper abdomen unremarkable.

Limited view of the skeleton and chest wall is unremarkable.
IMPRESSION: Very mild airspace disease in the LEFT upper lobe suggesting edema.
Findings less conspicuous than comparison radiograph.

## 2021-03-31 ENCOUNTER — Ambulatory Visit: Payer: No Typology Code available for payment source | Admitting: Cardiology

## 2021-04-03 ENCOUNTER — Ambulatory Visit: Payer: No Typology Code available for payment source | Admitting: Cardiology

## 2021-04-03 ENCOUNTER — Encounter: Payer: Self-pay | Admitting: Cardiology

## 2021-04-03 ENCOUNTER — Other Ambulatory Visit: Payer: Self-pay

## 2021-04-03 VITALS — BP 121/68 | HR 68 | Temp 98.0°F | Resp 16 | Ht 71.0 in | Wt 263.0 lb

## 2021-04-03 DIAGNOSIS — I2510 Atherosclerotic heart disease of native coronary artery without angina pectoris: Secondary | ICD-10-CM

## 2021-04-03 DIAGNOSIS — I10 Essential (primary) hypertension: Secondary | ICD-10-CM

## 2021-04-03 DIAGNOSIS — Z952 Presence of prosthetic heart valve: Secondary | ICD-10-CM

## 2021-04-03 NOTE — Progress Notes (Signed)
Subjective:   Charles Riggs., male    DOB: 21-Jun-1955, 65 y.o.   MRN: [id number]   Chief complaint:  Aortic regurgitation   65 year old African-American male,  32 PY former smoker, OSA, LBBB, h/o asthma, nonobstructive CAD, s/p bioprosthetic AVR (25 mm) through minithoracotomy (12/16/2018 Dr Roxy Manns) for severe AI with quadricuspid aortic valve.    Patient is doing well, denies chest pain, shortness of breath, palpitations, leg edema, orthopnea, PND, TIA/syncope. He is going to undergo right shoulder surgery soon.  Current Outpatient Medications on File Prior to Visit  Medication Sig Dispense Refill   acetaminophen (TYLENOL) 500 MG tablet as needed.     albuterol (PROVENTIL HFA;VENTOLIN HFA) 108 (90 Base) MCG/ACT inhaler Inhale 2 puffs into the lungs every 6 (six) hours as needed for wheezing or shortness of breath.     albuterol (PROVENTIL) (2.5 MG/3ML) 0.083% nebulizer solution Take 3 mLs (2.5 mg total) by nebulization every 6 (six) hours as needed for wheezing or shortness of breath. 75 mL 12   amLODipine (NORVASC) 10 MG tablet Take 10 mg by mouth daily.     aspirin EC 81 MG tablet Take 1 tablet (81 mg total) by mouth daily. 90 tablet 1   Atogepant (QULIPTA) 60 MG TABS Take 60 mg by mouth daily. 30 tablet 5   carvedilol (COREG) 6.25 MG tablet TAKE ONE TABLET BY MOUTH TWICE A DAY 180 tablet 3   Cholecalciferol (VITAMIN D3) 50 MCG (2000 UT) TABS Take 2,000 Units by mouth 2 (two) times daily.     dextromethorphan-guaiFENesin (MUCINEX DM) 30-600 MG 12hr tablet Take 1 tablet by mouth 2 (two) times daily.     Erenumab-aooe (AIMOVIG) 70 MG/ML SOAJ Inject 70 mg into the skin every 28 (twenty-eight) days. (Patient not taking: Reported on 12/28/2020) 1 pen 11   fluticasone (FLONASE) 50 MCG/ACT nasal spray Place 2 sprays into both nostrils daily as needed for allergies or rhinitis.     Fluticasone Furoate (ARNUITY ELLIPTA) 50 MCG/ACT AEPB in the morning and at bedtime.      Fluticasone-Salmeterol (ADVAIR) 250-50 MCG/DOSE AEPB INHALE 1 INHALATION TWICE A DAY (RINSE MOUTH WELL WITH WATER AFTER EACH USE (CONVERTED FROM SYMBICORT. NOTE NEW DIRECTIONS))     hydrOXYzine (VISTARIL) 25 MG capsule Take 25 mg by mouth 3 (three) times daily.     losartan (COZAAR) 50 MG tablet Take 50 mg by mouth daily.     methocarbamol (ROBAXIN) 750 MG tablet Take 1 tablet (750 mg total) by mouth 2 (two) times daily as needed for muscle spasms. 60 tablet 0   montelukast (SINGULAIR) 10 MG tablet Take 10 mg by mouth daily.     rosuvastatin (CRESTOR) 10 MG tablet Take 1 tablet (10 mg total) by mouth daily. 90 tablet 1   sertraline (ZOLOFT) 100 MG tablet Take 50 mg by mouth as needed.     SUMAtriptan (IMITREX) 50 MG tablet as needed.     Ubrogepant (UBRELVY) 100 MG TABS Take 1 tablet by mouth as needed (May repeat in 2 hours if needed.  Maximum 2 tablets in 24 hours.). 10 tablet 1   vardenafil (LEVITRA) 20 MG tablet daily as needed.     No current facility-administered medications on file prior to visit.    Cardiovascular studies:  EKG 04/03/2021: Sinus rhythm 66 bpm IVCD  Echocardiogram 10/07/2020:  Normal LV systolic function with visual EF 55-60%. Left ventricle cavity  is small. Moderate concentric hypertrophy of the left ventricle. Normal  global wall  motion. Normal diastolic filling pattern.  Patient has A 52m an Edwards bovine bioprosthetic valve in the aortic  position. Procedure Date: 12/16/2018.  No AV regurgitation. Trace to mild aortic valve  stenosis. Peak velocity  2.11 m/s, Peak Gradient 18 mmHg, Mean Gradient  9.3 mmHg, AVA 1.7cm.  No significant change from 12/29/2018.   Coronary angiogram 10/14/2018: LM: Normal LAD: Normal Ramus: Normal LCx: Normal RCA: Normal   Angiographically normal coronary arteries with no significant CAD Mild pulmonary hypertnension mean PA 26 mmHg   CTA coronary 08/27/2018: 1.  No aortic dissection. 2. Inadequate coaptation of the  aortic valve leaflets consistent with severe aortic insufficiency. 3. Coronary artery calcium score 37 Agatston units. This places the patient in the 50th percentile for age and gender, suggesting intermediate risk for future cardiac events. 4. Nonobstructive mild coronary disease. Interpretation of the RCA is somewhat limited by artifact but I think that there is no significant disease.   Recent labs: 02/2019: Glucose 155. BUN/Cr 15/1.47. eGFR 51 H/H 9.1/30.1, MCV 80. Platelets 226 HbA1C 5.5%  08/2018: Chol 225, TG 90, HDL 78, LDL 129   Review of Systems  Cardiovascular:  Negative for chest pain, dyspnea on exertion, leg swelling, palpitations and syncope.  Respiratory:  Negative for shortness of breath.   Musculoskeletal:  Positive for back pain, joint pain and neck pain.  Neurological:  Positive for paresthesias.       Vitals:   04/03/21 1120  BP: 121/68  Pulse: 68  Resp: 16  Temp: 98 F (36.7 C)  SpO2: 95%    Physical Exam Vitals and nursing note reviewed.  Constitutional:      General: He is not in acute distress. Neck:     Vascular: No JVD.  Cardiovascular:     Rate and Rhythm: Normal rate and regular rhythm.     Pulses: Intact distal pulses.     Heart sounds: Normal heart sounds. No murmur heard. Pulmonary:     Effort: Pulmonary effort is normal.     Breath sounds: Normal breath sounds. No wheezing or rales.  Chest:     Comments: Right sided herniation of ?pectoral muscle at the site of mini-thoracotomy scar Musculoskeletal:        General: Tenderness (Right shoulder tenderness) present.     Right lower leg: No edema.     Left lower leg: No edema.  Neurological:     General: No focal deficit present.     Cranial Nerves: No cranial nerve deficit.     Sensory: No sensory deficit.        Assessment & Recommendations:   65year old African-American male,  362PY former smoker, OSA, LBBB, h/o asthma, s/p bioprosthetic AVR (25 mm) through  minithoracotomy (12/16/2018 Dr ORoxy Manns for severe AI with quadricuspid aortic valve.    S/p AVR: 25 mm bioprosthetic valve for quadricuspid valve with severe AI Normal functioning on echocardiogram 09/2020. Continue Aspirin 81 mg daily.   Hypertension: Well controlled  Pre-op risk stratification: Low cardiac risk for shoulder surgery. Okay to proceed. Continue Aspirin 81 mg daily.   F/u in 1 year  MNigel Mormon MD PSpringhill [facility] CenterCardiovascular. PA Pager: [phone]: [phone] no answer Cell [phone]

## 2021-04-28 ENCOUNTER — Ambulatory Visit: Payer: No Typology Code available for payment source | Admitting: Orthopaedic Surgery

## 2021-05-02 ENCOUNTER — Ambulatory Visit (INDEPENDENT_AMBULATORY_CARE_PROVIDER_SITE_OTHER): Payer: No Typology Code available for payment source

## 2021-05-02 ENCOUNTER — Ambulatory Visit (INDEPENDENT_AMBULATORY_CARE_PROVIDER_SITE_OTHER): Payer: No Typology Code available for payment source | Admitting: Orthopaedic Surgery

## 2021-05-02 ENCOUNTER — Other Ambulatory Visit: Payer: Self-pay

## 2021-05-02 DIAGNOSIS — Z96642 Presence of left artificial hip joint: Secondary | ICD-10-CM

## 2021-05-02 NOTE — Progress Notes (Signed)
Office Visit Note   Patient: Charles Riggs.           Date of Birth: August 13, 1955           MRN: [id number] Visit Date: 05/02/2021              Requested by: [facility] [place] [address] [place],  [place] [address] PCP: [place]: Visit Diagnoses:  1. History of left hip replacement     Plan: Mykai is 14 months status post left total hip replacement.  Overall doing well has no real complaints other than some soreness with increased activity.  He is scheduled to have a right shoulder replacement with Dr. Tamera Punt in the near future.  Left hip exam is unremarkable.  Fully healed surgical scar.  Excellent range of motion and function and strength.  X-rays demonstrate a stable left total hip replacement without any complications.  [name] is doing very well from his hip replacement.  He is very happy overall.  Dental prophylaxis reinforced.  We will plan on seeing him back in another year for his 2-year visit with standing AP pelvis and lateral hip x-rays.  Follow-Up Instructions: No follow-ups on file.   Orders:  Orders Placed This Encounter  Procedures   XR Pelvis 1-2 Views   No orders of the defined types were placed in this encounter.     Procedures: No procedures performed   Clinical Data: No additional findings.   Subjective: Chief Complaint  Patient presents with   Left Hip - Follow-up    HPI  Review of Systems   Objective: Vital Signs: There were no vitals taken for this visit.  Physical Exam  Ortho Exam  Specialty Comments:  No specialty comments available.  Imaging: XR Pelvis 1-2 Views  Result Date: 05/02/2021 Stable total hip replacement without complications    PMFS History: Patient Active Problem List   Diagnosis Date Noted   Acute pain of right shoulder 07/13/2020   Numbness 07/11/2020   Primary osteoarthritis of right hip 10/21/2019   Anemia 10/15/2019   Hyperlipidemia 10/15/2019   Status  post total replacement of left hip 03/09/2019   Primary osteoarthritis of left hip 02/10/2019   Acute respiratory failure with hypoxemia (HCC) 01/04/2019   Pleural effusion 12/28/2018   Syncope 12/28/2018   Acute blood loss anemia    S/P AVR (aortic valve replacement) 12/16/2018   Anemia in chronic kidney disease    Chronic kidney disease (CKD)    Coronary artery disease involving native coronary artery of native heart without angina pectoris 09/04/2018   Elevated troponin 08/29/2018   Severe aortic insufficiency    Acute on chronic diastolic heart failure ([place])    Nonrheumatic aortic valve insufficiency    Essential hypertension    Obstructive sleep apnea    Quadricuspid aortic valve    Chest pain in adult 08/27/2018   LEG PAIN 04/26/2010   DEPRESSION 08/19/2009   RENAL CALCULUS 01/05/2009   DM 10/12/2008   Personal history presenting hazards to health 06/16/2007   Asthma 03/24/2007   GERD 03/24/2007   Past Medical History:  Diagnosis Date   Acute on chronic diastolic heart failure (HCC)    Anemia in chronic kidney disease    Anxiety    Aortic insufficiency    Arthritis    Asthma    CHF (congestive heart failure) (HCC)    CKD (chronic kidney disease)    Dyspnea    Essential hypertension  Nerve pain    Non-rheumatic aortic regurgitation    Obstructive sleep apnea    cpap   Quadricuspid aortic valve    S/P minimally invasive aortic valve replacement with bioprosthetic valve 12/16/2018   25 mm Edwards Inspiris Resilia stented bovine pericardial tissue valve via right mini thoracotomy approach   Severe aortic insufficiency    SOB (shortness of breath)    Syncope 12/2018    Family History  Problem Relation Age of Onset   COPD Father    Cancer Brother     Past Surgical History:  Procedure Laterality Date   AORTIC VALVE REPLACEMENT N/A 12/16/2018   Procedure: MINIMALLY INVASIVE AORTIC VALVE REPLACEMENT (AVR) using Inspiris Aortic valve 56mm.;  Surgeon: Rexene Alberts, MD;  Location: [facility];  Service: Open Heart Surgery;  Laterality: N/A;   BACK SURGERY     HERNIA REPAIR     IR THORACENTESIS ASP PLEURAL SPACE W/IMG GUIDE  12/29/2018   RIB PLATING  12/16/2018   Procedure: Rib Plating;  Surgeon: Rexene Alberts, MD;  Location: [facility];  Service: Open Heart Surgery;;   RIGHT/LEFT HEART CATH AND CORONARY ANGIOGRAPHY N/A 10/14/2018   Procedure: RIGHT/LEFT HEART CATH AND CORONARY ANGIOGRAPHY;  Surgeon: Nigel Mormon, MD;  Location: [facility];  Service: Cardiovascular;  Laterality: N/A;   TEE WITHOUT CARDIOVERSION N/A 11/11/2018   Procedure: TRANSESOPHAGEAL ECHOCARDIOGRAM (TEE);  Surgeon: Nigel Mormon, MD;  Location: [facility] ENDOS[facility];  Service: Cardiovascular;  Laterality: N/A;   TEE WITHOUT CARDIOVERSION N/A 12/16/2018   Procedure: TRANSESOPHAGEAL ECHOCARDIOGRAM (TEE);  Surgeon: Rexene Alberts, MD;  Location: [facility];  Service: Open Heart Surgery;  Laterality: N/A;   TOTAL HIP ARTHROPLASTY Left 03/09/2019   Procedure: LEFT TOTAL HIP ARTHROPLASTY ANTERIOR APPROACH;  Surgeon: Leandrew Koyanagi, MD;  Location: [employer];  Service: Orthopedics;  Laterality: Left;   Social History   Occupational History   Not on file  Tobacco Use   Smoking status: Former    Packs/day: 1.00    Years: 7.00    Pack years: 7.00    Types: Cigarettes    Quit date: 2011    Years since quitting: 11.9   Smokeless tobacco: Never   Tobacco comments:    30 PY  Vaping Use   Vaping Use: Never used  Substance and Sexual Activity   Alcohol use: No   Drug use: No   Sexual activity: Not on file

## 2021-05-12 ENCOUNTER — Other Ambulatory Visit: Payer: Self-pay | Admitting: Orthopedic Surgery

## 2021-05-24 NOTE — Patient Instructions (Signed)
DUE TO COVID-19 ONLY ONE VISITOR IS ALLOWED TO COME WITH YOU AND STAY IN THE WAITING ROOM ONLY DURING PRE OP AND PROCEDURE.   **NO VISITORS ARE ALLOWED IN THE SHORT STAY AREA OR RECOVERY ROOM!!**  IF YOU WILL BE ADMITTED INTO THE HOSPITAL YOU ARE ALLOWED ONLY TWO SUPPORT PEOPLE DURING VISITATION HOURS ONLY (7 AM -8PM)   The support person(s) must pass our screening, gel in and out, and wear a mask at all times, including in the patients room. Patients must also wear a mask when staff or their support person are in the room. Visitors GUEST BADGE MUST BE WORN VISIBLY  One adult visitor may remain with you overnight and MUST be in the room by 8 P.M.  No visitors under the age of 34. Any visitor under the age of 83 must be accompanied by an adult.     Your procedure is scheduled on: 06/01/21   Report to [facility] Main Entrance    Report to short stay at: 5:15 AM   Call this number if you have problems the morning of surgery [phone]   Do not eat food :After Midnight.   May have liquids until : 4:30 AM   day of surgery  CLEAR LIQUID DIET  Foods Allowed                                                                     Foods Excluded  Water, Black Coffee and tea, regular and decaf                             liquids that you cannot  Plain Jell-O in any flavor  (No red)                                           see through such as: Fruit ices (not with fruit pulp)                                     milk, soups, orange juice              Iced Popsicles (No red)                                    All solid food                                   Apple juices Sports drinks like Gatorade (No red) Lightly seasoned clear broth or consume(fat free) Sugar  Sample Menu Breakfast                                Lunch  Supper Cranberry juice                    Beef broth                            Chicken broth Jell-O                                      Grape juice                           Apple juice Coffee or tea                        Jell-O                                      Popsicle                                                Coffee or tea                        Coffee or tea      Complete one Ensure drink the morning of surgery at : 4:30 AM      the day of surgery.    The day of surgery:  Drink ONE (1) Pre-Surgery Clear Ensure or G2 by am the morning of surgery. Drink in one sitting. Do not sip.  This drink was given to you during your hospital  pre-op appointment visit. Nothing else to drink after completing the  Pre-Surgery Clear Ensure or G2.          If you have questions, please contact your surgeons office.    Oral Hygiene is also important to reduce your risk of infection.                                    Remember - BRUSH YOUR TEETH THE MORNING OF SURGERY WITH YOUR REGULAR TOOTHPASTE   Do NOT smoke after Midnight   Take these medicines the morning of surgery with A SIP OF WATER: sertraline,carvedilol,metoprolol,amlodipine,omeprazole,montelukast.  DO NOT TAKE ANY ORAL DIABETIC MEDICATIONS DAY OF YOUR SURGERY                              You may not have any metal on your body including hair pins, jewelry, and body piercing             Do not wear lotions, powders, perfumes/cologne, or deodorant              Men may shave face and neck.   Do not bring valuables to the hospital. [place].   Contacts, dentures or bridgework may not be worn into surgery.   Bring small overnight bag day of surgery.    Patients discharged on the  day of surgery will not be allowed to drive home.   Special Instructions: Bring a copy of your healthcare power of attorney and living will documents         the day of surgery if you haven't scanned them before.              Please read over the following fact sheets you were given: IF YOU HAVE QUESTIONS ABOUT YOUR PRE-OP  INSTRUCTIONS PLEASE CALL [phone]     [facility] - Preparing for Surgery Before surgery, you can play an important role.  Because skin is not sterile, your skin needs to be as free of germs as possible.  You can reduce the number of germs on your skin by washing with CHG (chlorahexidine gluconate) soap before surgery.  CHG is an antiseptic cleaner which kills germs and bonds with the skin to continue killing germs even after washing. Please DO NOT use if you have an allergy to CHG or antibacterial soaps.  If your skin becomes reddened/irritated stop using the CHG and inform your nurse when you arrive at Short Stay. Do not shave (including legs and underarms) for at least 48 hours prior to the first CHG shower.  You may shave your face/neck. Please follow these instructions carefully:  1.  Shower with CHG Soap the night before surgery and the  morning of Surgery.  2.  If you choose to wash your hair, wash your hair first as usual with your  normal  shampoo.  3.  After you shampoo, rinse your hair and body thoroughly to remove the  shampoo.                           4.  Use CHG as you would any other liquid soap.  You can apply chg directly  to the skin and wash                       Gently with a scrungie or clean washcloth.  5.  Apply the CHG Soap to your body ONLY FROM THE NECK DOWN.   Do not use on face/ open                           Wound or open sores. Avoid contact with eyes, ears mouth and genitals (private parts).                       Wash face,  Genitals (private parts) with your normal soap.             6.  Wash thoroughly, paying special attention to the area where your surgery  will be performed.  7.  Thoroughly rinse your body with warm water from the neck down.  8.  DO NOT shower/wash with your normal soap after using and rinsing off  the CHG Soap.                9.  Pat yourself dry with a clean towel.            10.  Wear clean pajamas.            11.  Place clean sheets on  your bed the night of your first shower and do not  sleep with pets. Day of Surgery : Do not apply any lotions/deodorants the morning of surgery.  Please wear clean  clothes to the hospital/surgery center.  FAILURE TO FOLLOW THESE INSTRUCTIONS MAY RESULT IN THE CANCELLATION OF YOUR SURGERY PATIENT SIGNATURE_________________________________  NURSE SIGNATURE__________________________________  ________________________________________________________________________   [facility]- Preparing for Total Shoulder Arthroplasty    Before surgery, you can play an important role. Because skin is not sterile, your skin needs to be as free of germs as possible. You can reduce the number of germs on your skin by using the following products. Benzoyl Peroxide Gel Reduces the number of germs present on the skin Applied twice a day to shoulder area starting two days before surgery    ================================================================== Please follow these instructions carefully:  BENZOYL PEROXIDE 5% GEL  Please do not use if you have an allergy to benzoyl peroxide.   If your skin becomes reddened/irritated stop using the benzoyl peroxide.  Starting two days before surgery, apply as follows: Apply benzoyl peroxide in the morning and at night. Apply after taking a shower. If you are not taking a shower clean entire shoulder front, back, and side along with the armpit with a clean wet washcloth.  Place a quarter-sized dollop on your shoulder and rub in thoroughly, making sure to cover the front, back, and side of your shoulder, along with the armpit.   2 days before ____ AM   ____ PM              1 day before ____ AM   ____ PM                         Do this twice a day for two days.  (Last application is the night before surgery, AFTER using the CHG soap as described below).  Do NOT apply benzoyl peroxide gel on the day of surgery.  Incentive Spirometer  An incentive spirometer is a  tool that can help keep your lungs clear and active. This tool measures how well you are filling your lungs with each breath. Taking long deep breaths may help reverse or decrease the chance of developing breathing (pulmonary) problems (especially infection) following: A long period of time when you are unable to move or be active. BEFORE THE PROCEDURE  If the spirometer includes an indicator to show your best effort, your nurse or respiratory therapist will set it to a desired goal. If possible, sit up straight or lean slightly forward. Try not to slouch. Hold the incentive spirometer in an upright position. INSTRUCTIONS FOR USE  Sit on the edge of your bed if possible, or sit up as far as you can in bed or on a chair. Hold the incentive spirometer in an upright position. Breathe out normally. Place the mouthpiece in your mouth and seal your lips tightly around it. Breathe in slowly and as deeply as possible, raising the piston or the ball toward the top of the column. Hold your breath for 3-5 seconds or for as long as possible. Allow the piston or ball to fall to the bottom of the column. Remove the mouthpiece from your mouth and breathe out normally. Rest for a few seconds and repeat Steps 1 through 7 at least 10 times every 1-2 hours when you are awake. Take your time and take a few normal breaths between deep breaths. The spirometer may include an indicator to show your best effort. Use the indicator as a goal to work toward during each repetition. After each set of 10 deep breaths, practice coughing to be sure your lungs are clear.  If you have an incision (the cut made at the time of surgery), support your incision when coughing by placing a pillow or rolled up towels firmly against it. Once you are able to get out of bed, walk around indoors and cough well. You may stop using the incentive spirometer when instructed by your caregiver.  RISKS AND COMPLICATIONS Take your time so you do not  get dizzy or light-headed. If you are in pain, you may need to take or ask for pain medication before doing incentive spirometry. It is harder to take a deep breath if you are having pain. AFTER USE Rest and breathe slowly and easily. It can be helpful to keep track of a log of your progress. Your caregiver can provide you with a simple table to help with this. If you are using the spirometer at home, follow these instructions: [place] IF:  You are having difficultly using the spirometer. You have trouble using the spirometer as often as instructed. Your pain medication is not giving enough relief while using the spirometer. You develop fever of 100.5 F (38.1 C) or higher. SEEK IMMEDIATE MEDICAL CARE IF:  You cough up bloody sputum that had not been present before. You develop fever of 102 F (38.9 C) or greater. You develop worsening pain at or near the incision site. MAKE SURE YOU:  Understand these instructions. Will watch your condition. Will get help right away if you are not doing well or get worse. Document Released: 09/24/2006 Document Revised: 08/06/2011 Document Reviewed: 11/25/2006 [facility] Patient Information 2014 [place], [place].   ________________________________________________________________________

## 2021-05-25 ENCOUNTER — Encounter (HOSPITAL_COMMUNITY)
Admission: RE | Admit: 2021-05-25 | Discharge: 2021-05-25 | Disposition: A | Discharge status: Home / Self Care | Source: Ambulatory Visit | Attending: Orthopedic Surgery | Admitting: Orthopedic Surgery

## 2021-05-25 ENCOUNTER — Other Ambulatory Visit: Payer: Self-pay

## 2021-05-25 ENCOUNTER — Encounter (HOSPITAL_COMMUNITY): Payer: Self-pay

## 2021-05-25 VITALS — BP 110/67 | HR 59 | Temp 98.4°F | Ht 71.0 in | Wt 265.0 lb

## 2021-05-25 DIAGNOSIS — Z01818 Encounter for other preprocedural examination: Secondary | ICD-10-CM

## 2021-05-25 DIAGNOSIS — M75101 Unspecified rotator cuff tear or rupture of right shoulder, not specified as traumatic: Secondary | ICD-10-CM | POA: Insufficient documentation

## 2021-05-25 DIAGNOSIS — I110 Hypertensive heart disease with heart failure: Secondary | ICD-10-CM | POA: Diagnosis not present

## 2021-05-25 DIAGNOSIS — J45909 Unspecified asthma, uncomplicated: Secondary | ICD-10-CM | POA: Insufficient documentation

## 2021-05-25 DIAGNOSIS — I509 Heart failure, unspecified: Secondary | ICD-10-CM | POA: Insufficient documentation

## 2021-05-25 DIAGNOSIS — Z01812 Encounter for preprocedural laboratory examination: Secondary | ICD-10-CM | POA: Diagnosis not present

## 2021-05-25 DIAGNOSIS — Z953 Presence of xenogenic heart valve: Secondary | ICD-10-CM | POA: Diagnosis not present

## 2021-05-25 DIAGNOSIS — I10 Essential (primary) hypertension: Secondary | ICD-10-CM

## 2021-05-25 LAB — CBC
HCT: 43.3 % (ref 39.0–52.0)
Hemoglobin: 13.5 g/dL (ref 13.0–17.0)
MCH: 27.7 pg (ref 26.0–34.0)
MCHC: 31.2 g/dL (ref 30.0–36.0)
MCV: 88.7 fL (ref 80.0–100.0)
Platelets: 218 10*3/uL (ref 150–400)
RBC: 4.88 MIL/uL (ref 4.22–5.81)
RDW: 13.2 % (ref 11.5–15.5)
WBC: 3.9 10*3/uL — ABNORMAL LOW (ref 4.0–10.5)
nRBC: 0 % (ref 0.0–0.2)

## 2021-05-25 LAB — BASIC METABOLIC PANEL
Anion gap: 8 (ref 5–15)
BUN: 14 mg/dL (ref 8–23)
CO2: 27 mmol/L (ref 22–32)
Calcium: 9.2 mg/dL (ref 8.9–10.3)
Chloride: 104 mmol/L (ref 98–111)
Creatinine, Ser: 1.28 mg/dL — ABNORMAL HIGH (ref 0.61–1.24)
GFR, Estimated: >60 mL/min (ref >60)
Glucose, Bld: 112 mg/dL — ABNORMAL HIGH (ref 70–99)
Potassium: 4.3 mmol/L (ref 3.5–5.1)
Sodium: 139 mmol/L (ref 135–145)

## 2021-05-25 LAB — SURGICAL PCR SCREEN
MRSA, PCR: NEGATIVE
Staphylococcus aureus: NEGATIVE

## 2021-05-25 NOTE — Progress Notes (Signed)
COVID Vaccine Completed: Yes Date COVID Vaccine completed: 02/18/21 x 3 COVID vaccine manufacturer: Pfizer     COVID Test: N/A PCP - [facility] Cardiologist - Dr. Vernell Leep. LOV: 04/03/21  Chest x-ray - 11/18/20 EKG - 04/03/21 Stress Test -  ECHO - 10/07/20 Cardiac Cath -  Pacemaker/ICD device last checked:  Sleep Study - Yes CPAP - Yes  Fasting Blood Sugar -  Checks Blood Sugar _____ times a day  Blood Thinner Instructions: Dr. Tamera Punt Aspirin Instructions: On hold since 05/25/21 Last Dose:  Anesthesia review: Hx: HTN,CHF,OSA(CPAP),Lt. BBB  Patient denies shortness of breath, fever, cough and chest pain at PAT appointment   Patient verbalized understanding of instructions that were given to them at the PAT appointment. Patient was also instructed that they will need to review over the PAT instructions again at home before surgery.

## 2021-05-28 HISTORY — PX: BACK SURGERY: SHX140

## 2021-05-30 NOTE — Anesthesia Preprocedure Evaluation (Addendum)
Anesthesia Evaluation  Patient identified by MRN, date of birth, ID band Patient awake    Reviewed: Allergy & Precautions, NPO status , Patient's Chart, lab work & pertinent test results  History of Anesthesia Complications Negative for: history of anesthetic complications  Airway Mallampati: II  TM Distance: >3 FB Neck ROM: Full    Dental  (+) Dental Advisory Given, Missing   Pulmonary asthma , sleep apnea and Continuous Positive Airway Pressure Ventilation , COPD,  COPD inhaler, former smoker,    breath sounds clear to auscultation       Cardiovascular hypertension, Pt. on medications and Pt. on home beta blockers (-) angina+CHF  + Valvular Problems/Murmurs (hx severe aortic insufficiency and  quadricuspid aortic valve (s/p AVR with bioprosthetic valve 12/16/18)  Rhythm:Regular Rate:Normal  09/2020 ECHO: EF 55-60%, mod concentric hypertrophy of the LV, trace-mild AS, mean grad 9 mmHg, peak gradient 18 mmHg  09/2018 cath: no sig CAD, mild pulm HTN   Neuro/Psych Anxiety Depression negative neurological ROS     GI/Hepatic Neg liver ROS, GERD  Controlled,  Endo/Other  Morbid obesity  Renal/GU Renal InsufficiencyRenal disease     Musculoskeletal  (+) Arthritis ,   Abdominal (+) + obese,   Peds  Hematology negative hematology ROS (+)   Anesthesia Other Findings   Reproductive/Obstetrics                           Anesthesia Physical Anesthesia Plan  ASA: 3  Anesthesia Plan: General   Post-op Pain Management: Regional block and Tylenol PO (pre-op)   Induction: Intravenous  PONV Risk Score and Plan: 2 and Ondansetron and Dexamethasone  Airway Management Planned: Oral ETT  Additional Equipment: None  Intra-op Plan:   Post-operative Plan: Extubation in OR  Informed Consent: I have reviewed the patients History and Physical, chart, labs and discussed the procedure including the risks,  benefits and alternatives for the proposed anesthesia with the patient or authorized representative who has indicated his/her understanding and acceptance.     Dental advisory given  Plan Discussed with: CRNA and Surgeon  Anesthesia Plan Comments: (See APP note by Durel Salts, FNP Plan routine monitors, GETA with interscalene block for post op analgesia)      Anesthesia Quick Evaluation

## 2021-05-30 NOTE — Progress Notes (Addendum)
Anesthesia Chart Review:   Case: [id number] Date/Time: 06/01/21 0715   Procedure: REVERSE SHOULDER ARTHROPLASTY (Right: Shoulder)   Anesthesia type: Choice   Pre-op diagnosis: RIGHT SHOULDER ROTATOR CUFF TEAR ARTHROPATHY   Location: [facility] ROOM 07 / [facility]   Surgeons: Tania Ade, MD       DISCUSSION: Pt is 66 years old with hx severe aortic insufficiency and  quadricuspid aortic valve (s/p AVR with bioprosthetic valve 12/16/18), CHF, HTN, anemia in chronic disease, asthma  VS: BP 110/67    Pulse (!) 59    Temp 36.9 C (Oral)    Ht 5\' 11"  (1.803 m)    Wt 120.2 kg    SpO2 97%    BMI 36.96 kg/m   PROVIDERS: - Receives primary care at [place] is Vernell Leep, MD. Cleared for surgery at last office visit 04/03/21.    LABS: Labs reviewed: Acceptable for surgery. (all labs ordered are listed, but only abnormal results are displayed)  Labs Reviewed  CBC - Abnormal; Notable for the following components:      Result Value   WBC 3.9 (*)    All other components within normal limits  BASIC METABOLIC PANEL - Abnormal; Notable for the following components:   Glucose, Bld 112 (*)    Creatinine, Ser 1.28 (*)    All other components within normal limits  SURGICAL PCR SCREEN  TYPE AND SCREEN     IMAGES: 1 view CXR 11/18/20: Low lung volumes without evidence of acute traumatic injury to the thorax.  EKG 04/03/21: Sinus rhythm 66 bpm. IVCD   CV: Echo 10/07/20:  Normal LV systolic function with visual EF 55-60%. Left ventricle cavity is small. Moderate concentric hypertrophy of the left ventricle. Normal global wall motion. Normal diastolic filling pattern.  Patient has A 8mm an Edwards bovine bioprosthetic valve in the aortic position. Procedure Date: 12/16/2018. No aortic valve regurgitation. Mild aortic valve leaflet calcification. Mildly restricted aortic valve leaflets. Trace to mild aortic valve stenosis. Peak velocity 2.11 m/s, Peak Gradient 18 mmHg, Mean  Gradient   9.3 mmHg, AVA 1.7cm. Compared to 07/14/2018, severe aortic regurgitation not present in the study (images personally reviewed to confirm).    R/L cardiac cath 10/14/18:  - Angiographically normal coronary arteries with no significant CAD - Mild pulmonary hypertnension mean PA 26 mmHg   Past Medical History:  Diagnosis Date   Acute on chronic diastolic heart failure (HCC)    Anemia in chronic kidney disease    Anxiety    Aortic insufficiency    Arthritis    Asthma    CHF (congestive heart failure) (HCC)    CKD (chronic kidney disease)    Dyspnea    Essential hypertension    Nerve pain    Non-rheumatic aortic regurgitation    Obstructive sleep apnea    cpap   Quadricuspid aortic valve    S/P minimally invasive aortic valve replacement with bioprosthetic valve 12/16/2018   25 mm Edwards Inspiris Resilia stented bovine pericardial tissue valve via right mini thoracotomy approach   Severe aortic insufficiency    SOB (shortness of breath)    Syncope 12/2018    Past Surgical History:  Procedure Laterality Date   AORTIC VALVE REPLACEMENT N/A 12/16/2018   Procedure: MINIMALLY INVASIVE AORTIC VALVE REPLACEMENT (AVR) using Inspiris Aortic valve 4mm.;  Surgeon: Rexene Alberts, MD;  [facility];  Service: Open Heart Surgery;  Laterality: N/A;   BACK SURGERY     HERNIA REPAIR  IR THORACENTESIS ASP PLEURAL SPACE W/IMG GUIDE  12/29/2018   RIB PLATING  12/16/2018   Procedure: Rib Plating;  Surgeon: Rexene Alberts, MD;  Location: [place];  Service: Open Heart Surgery;;   RIGHT/LEFT HEART CATH AND CORONARY ANGIOGRAPHY N/A 10/14/2018   Procedure: RIGHT/LEFT HEART CATH AND CORONARY ANGIOGRAPHY;  Surgeon: Nigel Mormon, MD;  Location: [place] [facility];  Service: Cardiovascular;  Laterality: N/A;   TEE WITHOUT CARDIOVERSION N/A 11/11/2018   Procedure: TRANSESOPHAGEAL ECHOCARDIOGRAM (TEE);  Surgeon: Nigel Mormon, MD;  [facility] ENDOSCOPY;  Service:  Cardiovascular;  Laterality: N/A;   TEE WITHOUT CARDIOVERSION N/A 12/16/2018   Procedure: TRANSESOPHAGEAL ECHOCARDIOGRAM (TEE);  Surgeon: Rexene Alberts, MD;  Location: [place];  Service: Open Heart Surgery;  Laterality: N/A;   TOTAL HIP ARTHROPLASTY Left 03/09/2019   Procedure: LEFT TOTAL HIP ARTHROPLASTY ANTERIOR APPROACH;  Surgeon: Leandrew Koyanagi, MD;  Location: [place];  Service: Orthopedics;  Laterality: Left;    MEDICATIONS:  albuterol (PROVENTIL HFA;VENTOLIN HFA) 108 (90 Base) MCG/ACT inhaler   albuterol (PROVENTIL) (2.5 MG/3ML) 0.083% nebulizer solution   amLODipine (NORVASC) 10 MG tablet   aspirin EC 81 MG tablet   Atogepant (QULIPTA) 60 MG TABS   baclofen (LIORESAL) 10 MG tablet   budesonide-formoterol (SYMBICORT) 160-4.5 MCG/ACT inhaler   carvedilol (COREG) 6.25 MG tablet   Cholecalciferol (VITAMIN D3) 50 MCG (2000 UT) TABS   Erenumab-aooe (AIMOVIG) 70 MG/ML SOAJ   ferrous sulfate 325 (65 FE) MG tablet   fluticasone (FLONASE) 50 MCG/ACT nasal spray   Fluticasone-Salmeterol (ADVAIR) 250-50 MCG/DOSE AEPB   guaiFENesin (MUCINEX) 600 MG 12 hr tablet   losartan (COZAAR) 50 MG tablet   methocarbamol (ROBAXIN) 750 MG tablet   metoprolol tartrate (LOPRESSOR) 50 MG tablet   montelukast (SINGULAIR) 10 MG tablet   omeprazole (PRILOSEC) 40 MG capsule   polycarbophil (FIBERCON) 625 MG tablet   rosuvastatin (CRESTOR) 10 MG tablet   rosuvastatin (CRESTOR) 20 MG tablet   sertraline (ZOLOFT) 100 MG tablet   Ubrogepant (UBRELVY) 100 MG TABS   No current facility-administered medications for this encounter.    If no changes, I anticipate pt can proceed with surgery as scheduled.   Willeen Cass, PhD, FNP-BC [facility] Short Stay Surgical [facility]/Anesthesiology Phone: [phone] 05/30/2021 10:03 AM

## 2021-06-01 ENCOUNTER — Other Ambulatory Visit: Payer: Self-pay

## 2021-06-01 ENCOUNTER — Encounter (HOSPITAL_COMMUNITY): Payer: Self-pay | Admitting: Orthopedic Surgery

## 2021-06-01 ENCOUNTER — Ambulatory Visit (HOSPITAL_COMMUNITY): Payer: No Typology Code available for payment source

## 2021-06-01 ENCOUNTER — Encounter (HOSPITAL_COMMUNITY)
Admission: RE | Disposition: A | Discharge status: Home / Self Care | Payer: Self-pay | Source: Home / Self Care | Attending: Orthopedic Surgery

## 2021-06-01 ENCOUNTER — Ambulatory Visit (HOSPITAL_COMMUNITY): Admitting: Emergency Medicine

## 2021-06-01 ENCOUNTER — Ambulatory Visit (HOSPITAL_COMMUNITY): Admitting: Certified Registered Nurse Anesthetist

## 2021-06-01 ENCOUNTER — Ambulatory Visit (HOSPITAL_COMMUNITY)
Admission: RE | Admit: 2021-06-01 | Discharge: 2021-06-01 | Disposition: A | Discharge status: Home / Self Care | Attending: Orthopedic Surgery | Admitting: Orthopedic Surgery

## 2021-06-01 DIAGNOSIS — Z6836 Body mass index (BMI) 36.0-36.9, adult: Secondary | ICD-10-CM | POA: Diagnosis not present

## 2021-06-01 DIAGNOSIS — Z953 Presence of xenogenic heart valve: Secondary | ICD-10-CM | POA: Diagnosis not present

## 2021-06-01 DIAGNOSIS — I130 Hypertensive heart and chronic kidney disease with heart failure and stage 1 through stage 4 chronic kidney disease, or unspecified chronic kidney disease: Secondary | ICD-10-CM | POA: Diagnosis not present

## 2021-06-01 DIAGNOSIS — F32A Depression, unspecified: Secondary | ICD-10-CM | POA: Insufficient documentation

## 2021-06-01 DIAGNOSIS — I351 Nonrheumatic aortic (valve) insufficiency: Secondary | ICD-10-CM | POA: Insufficient documentation

## 2021-06-01 DIAGNOSIS — M129 Arthropathy, unspecified: Secondary | ICD-10-CM | POA: Diagnosis not present

## 2021-06-01 DIAGNOSIS — F419 Anxiety disorder, unspecified: Secondary | ICD-10-CM | POA: Diagnosis not present

## 2021-06-01 DIAGNOSIS — I5032 Chronic diastolic (congestive) heart failure: Secondary | ICD-10-CM | POA: Diagnosis not present

## 2021-06-01 DIAGNOSIS — Z87891 Personal history of nicotine dependence: Secondary | ICD-10-CM | POA: Diagnosis not present

## 2021-06-01 DIAGNOSIS — Z96611 Presence of right artificial shoulder joint: Secondary | ICD-10-CM

## 2021-06-01 DIAGNOSIS — K219 Gastro-esophageal reflux disease without esophagitis: Secondary | ICD-10-CM | POA: Insufficient documentation

## 2021-06-01 DIAGNOSIS — S46011A Strain of muscle(s) and tendon(s) of the rotator cuff of right shoulder, initial encounter: Secondary | ICD-10-CM | POA: Diagnosis present

## 2021-06-01 DIAGNOSIS — Z01811 Encounter for preprocedural respiratory examination: Secondary | ICD-10-CM

## 2021-06-01 DIAGNOSIS — M25711 Osteophyte, right shoulder: Secondary | ICD-10-CM | POA: Diagnosis not present

## 2021-06-01 DIAGNOSIS — Y990 Civilian activity done for income or pay: Secondary | ICD-10-CM | POA: Insufficient documentation

## 2021-06-01 DIAGNOSIS — J449 Chronic obstructive pulmonary disease, unspecified: Secondary | ICD-10-CM | POA: Diagnosis not present

## 2021-06-01 DIAGNOSIS — X58XXXA Exposure to other specified factors, initial encounter: Secondary | ICD-10-CM | POA: Diagnosis not present

## 2021-06-01 DIAGNOSIS — G4733 Obstructive sleep apnea (adult) (pediatric): Secondary | ICD-10-CM | POA: Insufficient documentation

## 2021-06-01 HISTORY — PX: REVERSE SHOULDER ARTHROPLASTY: SHX5054

## 2021-06-01 LAB — TYPE AND SCREEN
ABO/RH(D): O POS
Antibody Screen: NEGATIVE

## 2021-06-01 SURGERY — ARTHROPLASTY, SHOULDER, TOTAL, REVERSE
Anesthesia: General | Site: Shoulder | Laterality: Right

## 2021-06-01 MED ORDER — OXYCODONE HCL 5 MG PO TABS: 5.0000 mg | ORAL_TABLET | ORAL | 0 refills | Status: DC | PRN

## 2021-06-01 MED ORDER — EPHEDRINE SULFATE-NACL 50-0.9 MG/10ML-% IV SOSY
PREFILLED_SYRINGE | INTRAVENOUS | Status: DC | PRN
  Administered 2021-06-01 (×4): 5 mg via INTRAVENOUS

## 2021-06-01 MED ORDER — PROMETHAZINE HCL 25 MG/ML IJ SOLN: 6.2500 mg | INTRAMUSCULAR | Status: DC | PRN

## 2021-06-01 MED ORDER — OXYCODONE HCL 5 MG PO TABS: 5.0000 mg | ORAL_TABLET | Freq: Once | ORAL | Status: DC | PRN

## 2021-06-01 MED ORDER — SUGAMMADEX SODIUM 200 MG/2ML IV SOLN
INTRAVENOUS | Status: DC | PRN
Start: 2021-06-01 — End: 2021-06-01
  Administered 2021-06-01: 200 mg via INTRAVENOUS

## 2021-06-01 MED ORDER — ORAL CARE MOUTH RINSE: 15.0000 mL | Freq: Once | OROMUCOSAL | Status: AC

## 2021-06-01 MED ORDER — MEPERIDINE HCL 50 MG/ML IJ SOLN: 6.2500 mg | INTRAMUSCULAR | Status: DC | PRN

## 2021-06-01 MED ORDER — CEFAZOLIN IN SODIUM CHLORIDE 3-0.9 GM/100ML-% IV SOLN
3.0000 g | INTRAVENOUS | Status: AC
  Administered 2021-06-01: 3 g via INTRAVENOUS
  Filled 2021-06-01: qty 100

## 2021-06-01 MED ORDER — 0.9 % SODIUM CHLORIDE (POUR BTL) OPTIME
TOPICAL | Status: DC | PRN
  Administered 2021-06-01: 1000 mL

## 2021-06-01 MED ORDER — OXYCODONE HCL 5 MG/5ML PO SOLN: 5.0000 mg | Freq: Once | ORAL | Status: DC | PRN

## 2021-06-01 MED ORDER — SODIUM CHLORIDE 0.9 % IR SOLN
Status: DC | PRN
Start: 2021-06-01 — End: 2021-06-01
  Administered 2021-06-01: 1000 mL

## 2021-06-01 MED ORDER — BUPIVACAINE-EPINEPHRINE (PF) 0.5% -1:200000 IJ SOLN
INTRAMUSCULAR | Status: DC | PRN
  Administered 2021-06-01: 10 mL via PERINEURAL

## 2021-06-01 MED ORDER — METOPROLOL TARTRATE 50 MG PO TABS
50.0000 mg | ORAL_TABLET | Freq: Once | ORAL | Status: AC
  Administered 2021-06-01: 50 mg via ORAL
  Filled 2021-06-01: qty 1

## 2021-06-01 MED ORDER — EPHEDRINE 5 MG/ML INJ
INTRAVENOUS | Status: AC
  Filled 2021-06-01: qty 5

## 2021-06-01 MED ORDER — ROCURONIUM BROMIDE 10 MG/ML (PF) SYRINGE
PREFILLED_SYRINGE | INTRAVENOUS | Status: AC
  Filled 2021-06-01: qty 10

## 2021-06-01 MED ORDER — PHENYLEPHRINE 40 MCG/ML (10ML) SYRINGE FOR IV PUSH (FOR BLOOD PRESSURE SUPPORT)
PREFILLED_SYRINGE | INTRAVENOUS | Status: AC
  Filled 2021-06-01: qty 10

## 2021-06-01 MED ORDER — PROPOFOL 10 MG/ML IV BOLUS
INTRAVENOUS | Status: DC | PRN
  Administered 2021-06-01: 150 mg via INTRAVENOUS

## 2021-06-01 MED ORDER — PHENYLEPHRINE 40 MCG/ML (10ML) SYRINGE FOR IV PUSH (FOR BLOOD PRESSURE SUPPORT)
PREFILLED_SYRINGE | INTRAVENOUS | Status: DC | PRN
  Administered 2021-06-01: 80 ug via INTRAVENOUS

## 2021-06-01 MED ORDER — CHLORHEXIDINE GLUCONATE 0.12 % MT SOLN
15.0000 mL | Freq: Once | OROMUCOSAL | Status: AC
  Administered 2021-06-01: 15 mL via OROMUCOSAL

## 2021-06-01 MED ORDER — HYDROMORPHONE HCL 1 MG/ML IJ SOLN: 0.2500 mg | INTRAMUSCULAR | Status: DC | PRN

## 2021-06-01 MED ORDER — FENTANYL CITRATE (PF) 100 MCG/2ML IJ SOLN
INTRAMUSCULAR | Status: AC
  Filled 2021-06-01: qty 2

## 2021-06-01 MED ORDER — METHOCARBAMOL 500 MG IVPB - SIMPLE MED: 500.0000 mg | Freq: Four times a day (QID) | INTRAVENOUS | Status: DC | PRN

## 2021-06-01 MED ORDER — PROPOFOL 10 MG/ML IV BOLUS
INTRAVENOUS | Status: AC
  Filled 2021-06-01: qty 20

## 2021-06-01 MED ORDER — METHOCARBAMOL 500 MG PO TABS: 500.0000 mg | ORAL_TABLET | Freq: Three times a day (TID) | ORAL | 0 refills | Status: DC | PRN

## 2021-06-01 MED ORDER — DEXAMETHASONE SODIUM PHOSPHATE 4 MG/ML IJ SOLN
INTRAMUSCULAR | Status: DC | PRN
  Administered 2021-06-01: 5 mg via INTRAVENOUS

## 2021-06-01 MED ORDER — TRANEXAMIC ACID-NACL 1000-0.7 MG/100ML-% IV SOLN
1000.0000 mg | INTRAVENOUS | Status: AC
  Administered 2021-06-01: 1000 mg via INTRAVENOUS
  Filled 2021-06-01: qty 100

## 2021-06-01 MED ORDER — DEXAMETHASONE SODIUM PHOSPHATE 10 MG/ML IJ SOLN
INTRAMUSCULAR | Status: AC
  Filled 2021-06-01: qty 1

## 2021-06-01 MED ORDER — WATER FOR IRRIGATION, STERILE IR SOLN
Status: DC | PRN
  Administered 2021-06-01: 2000 mL

## 2021-06-01 MED ORDER — FENTANYL CITRATE (PF) 100 MCG/2ML IJ SOLN
INTRAMUSCULAR | Status: DC | PRN
  Administered 2021-06-01 (×2): 50 ug via INTRAVENOUS

## 2021-06-01 MED ORDER — LIDOCAINE 2% (20 MG/ML) 5 ML SYRINGE
INTRAMUSCULAR | Status: DC | PRN
  Administered 2021-06-01: 30 mg via INTRAVENOUS

## 2021-06-01 MED ORDER — MIDAZOLAM HCL 2 MG/2ML IJ SOLN: 0.5000 mg | Freq: Once | INTRAMUSCULAR | Status: DC | PRN

## 2021-06-01 MED ORDER — ONDANSETRON HCL 4 MG/2ML IJ SOLN
INTRAMUSCULAR | Status: AC
  Filled 2021-06-01: qty 2

## 2021-06-01 MED ORDER — ROCURONIUM BROMIDE 10 MG/ML (PF) SYRINGE
PREFILLED_SYRINGE | INTRAVENOUS | Status: DC | PRN
  Administered 2021-06-01: 60 mg via INTRAVENOUS

## 2021-06-01 MED ORDER — LIDOCAINE HCL (PF) 2 % IJ SOLN
INTRAMUSCULAR | Status: AC
  Filled 2021-06-01: qty 5

## 2021-06-01 MED ORDER — LACTATED RINGERS IV SOLN: INTRAVENOUS | Status: DC

## 2021-06-01 MED ORDER — METHOCARBAMOL 500 MG PO TABS: 500.0000 mg | ORAL_TABLET | Freq: Four times a day (QID) | ORAL | Status: DC | PRN

## 2021-06-01 MED ORDER — SUCCINYLCHOLINE CHLORIDE 200 MG/10ML IV SOSY
PREFILLED_SYRINGE | INTRAVENOUS | Status: DC | PRN
Start: 2021-06-01 — End: 2021-06-01
  Administered 2021-06-01: 120 mg via INTRAVENOUS

## 2021-06-01 MED ORDER — BUPIVACAINE LIPOSOME 1.3 % IJ SUSP
INTRAMUSCULAR | Status: DC | PRN
  Administered 2021-06-01: 10 mL via PERINEURAL

## 2021-06-01 MED ORDER — ONDANSETRON HCL 4 MG/2ML IJ SOLN
INTRAMUSCULAR | Status: DC | PRN
Start: 2021-06-01 — End: 2021-06-01
  Administered 2021-06-01: 4 mg via INTRAVENOUS

## 2021-06-01 MED ORDER — ACETAMINOPHEN 500 MG PO TABS
1000.0000 mg | ORAL_TABLET | Freq: Once | ORAL | Status: AC
  Administered 2021-06-01: 1000 mg via ORAL
  Filled 2021-06-01: qty 2

## 2021-06-01 SURGICAL SUPPLY — 77 items
AID PSTN UNV HD RSTRNT DISP (MISCELLANEOUS) ×1
BAG COUNTER SPONGE SURGICOUNT (BAG) ×1 IMPLANT
BAG SPEC THK2 15X12 ZIP CLS (MISCELLANEOUS) ×1
BAG SPNG CNTER NS LX DISP (BAG) ×1
BAG ZIPLOCK 12X15 (MISCELLANEOUS) ×2 IMPLANT
BASEPLATE P2 COATD GLND 6.5X30 (Shoulder) IMPLANT
BIT DRILL 1.6MX128 (BIT) IMPLANT
BIT DRILL 2.5 DIA 127 CALI (BIT) ×1 IMPLANT
BIT DRILL 4 DIA CALIBRATED (BIT) ×1 IMPLANT
BLADE SAW SAG 73X25 THK (BLADE) ×1
BLADE SAW SGTL 73X25 THK (BLADE) ×1 IMPLANT
BOOTIES KNEE HIGH SLOAN (MISCELLANEOUS) ×4 IMPLANT
BSPLAT GLND 30 STRL LF SHLDR (Shoulder) ×1 IMPLANT
COOLER ICEMAN CLASSIC (MISCELLANEOUS) ×1 IMPLANT
COVER BACK TABLE 60X90IN (DRAPES) ×2 IMPLANT
COVER SURGICAL LIGHT HANDLE (MISCELLANEOUS) ×2 IMPLANT
DRAPE INCISE IOBAN 66X45 STRL (DRAPES) ×2 IMPLANT
DRAPE ORTHO SPLIT 77X108 STRL (DRAPES) ×4
DRAPE POUCH INSTRU U-SHP 10X18 (DRAPES) ×2 IMPLANT
DRAPE SHEET LG 3/4 BI-LAMINATE (DRAPES) ×2 IMPLANT
DRAPE SURG 17X11 SM STRL (DRAPES) ×2 IMPLANT
DRAPE SURG ORHT 6 SPLT 77X108 (DRAPES) ×2 IMPLANT
DRAPE TOP 10253 STERILE (DRAPES) ×2 IMPLANT
DRAPE U-SHAPE 47X51 STRL (DRAPES) ×2 IMPLANT
DRSG AQUACEL AG ADV 3.5X 6 (GAUZE/BANDAGES/DRESSINGS) ×2 IMPLANT
DURAPREP 26ML APPLICATOR (WOUND CARE) ×4 IMPLANT
ELECT BLADE TIP CTD 4 INCH (ELECTRODE) ×2 IMPLANT
ELECT REM PT RETURN 15FT ADLT (MISCELLANEOUS) ×2 IMPLANT
GLOVE SRG 8 PF TXTR STRL LF DI (GLOVE) ×1 IMPLANT
GLOVE SURG ENC MOIS LTX SZ7.5 (GLOVE) ×2 IMPLANT
GLOVE SURG POLYISO LF SZ6.5 (GLOVE) ×2 IMPLANT
GLOVE SURG UNDER POLY LF SZ6.5 (GLOVE) ×2 IMPLANT
GLOVE SURG UNDER POLY LF SZ8 (GLOVE) ×2
GOWN STRL REUS W/TWL LRG LVL3 (GOWN DISPOSABLE) ×2 IMPLANT
GOWN STRL REUS W/TWL XL LVL3 (GOWN DISPOSABLE) ×2 IMPLANT
HANDPIECE INTERPULSE COAX TIP (DISPOSABLE) ×2
HOOD PEEL AWAY FLYTE STAYCOOL (MISCELLANEOUS) ×6 IMPLANT
INSERT EPOLY STND HUMERUS+4 32 (Shoulder) ×2 IMPLANT
INSERT EPOLYSTD HUMERUS+4 32 (Shoulder) IMPLANT
KIT BASIN OR (CUSTOM PROCEDURE TRAY) ×2 IMPLANT
KIT TURNOVER KIT A (KITS) IMPLANT
MANIFOLD NEPTUNE II (INSTRUMENTS) ×2 IMPLANT
NDL TROCAR POINT SZ 2 1/2 (NEEDLE) IMPLANT
NEEDLE TROCAR POINT SZ 2 1/2 (NEEDLE) IMPLANT
NS IRRIG 1000ML POUR BTL (IV SOLUTION) ×2 IMPLANT
P2 COATDE GLNOID BSEPLT 6.5X30 (Shoulder) ×2 IMPLANT
PACK SHOULDER (CUSTOM PROCEDURE TRAY) ×2 IMPLANT
PAD COLD SHLDR WRAP-ON (PAD) ×1 IMPLANT
PROTECTOR NERVE ULNAR (MISCELLANEOUS) IMPLANT
RESTRAINT HEAD UNIVERSAL NS (MISCELLANEOUS) ×1 IMPLANT
RETRIEVER SUT HEWSON (MISCELLANEOUS) ×1 IMPLANT
SCREW BONE LOCKING RSP 5.0X34 (Screw) ×4 IMPLANT
SCREW BONE RSP LOCK 5X18 (Screw) IMPLANT
SCREW BONE RSP LOCK 5X34 (Screw) IMPLANT
SCREW BONE RSP LOCKING 18MM LG (Screw) ×4 IMPLANT
SCREW RETAIN W/HEAD 32MM (Shoulder) ×1 IMPLANT
SET HNDPC FAN SPRY TIP SCT (DISPOSABLE) ×1 IMPLANT
SLING ARM IMMOBILIZER LRG (SOFTGOODS) ×1 IMPLANT
SLING ARM IMMOBILIZER MED (SOFTGOODS) IMPLANT
SPONGE T-LAP 18X18 ~~LOC~~+RFID (SPONGE) ×2 IMPLANT
SPONGE T-LAP 4X18 ~~LOC~~+RFID (SPONGE) ×1 IMPLANT
STEM HUMERAL STD SHELL 14X48 (Miscellaneous) ×1 IMPLANT
STRIP CLOSURE SKIN 1/2X4 (GAUZE/BANDAGES/DRESSINGS) ×4 IMPLANT
SUCTION FRAZIER HANDLE 10FR (MISCELLANEOUS)
SUCTION TUBE FRAZIER 10FR DISP (MISCELLANEOUS) IMPLANT
SUPPORT WRAP ARM LG (MISCELLANEOUS) IMPLANT
SUT ETHIBOND 2 V 37 (SUTURE) ×1 IMPLANT
SUT FIBERWIRE #2 38 REV NDL BL (SUTURE)
SUT MNCRL AB 4-0 PS2 18 (SUTURE) ×2 IMPLANT
SUT VIC AB 2-0 CT1 27 (SUTURE) ×4
SUT VIC AB 2-0 CT1 TAPERPNT 27 (SUTURE) ×1 IMPLANT
SUTURE FIBERWR#2 38 REV NDL BL (SUTURE) IMPLANT
TAPE LABRALWHITE 1.5X36 (TAPE) ×1 IMPLANT
TAPE SUT LABRALTAP WHT/BLK (SUTURE) ×1 IMPLANT
TOWEL OR 17X26 10 PK STRL BLUE (TOWEL DISPOSABLE) ×2 IMPLANT
TOWEL OR NON WOVEN STRL DISP B (DISPOSABLE) ×2 IMPLANT
WATER STERILE IRR 1000ML POUR (IV SOLUTION) ×2 IMPLANT

## 2021-06-01 NOTE — Evaluation (Signed)
Occupational Therapy Evaluation Patient Details Name: Charles Riggs. MRN: [id number] DOB: Apr 02, 1956 Today's Date: 06/01/2021   History of Present Illness Patient s/p R rTSA. PMH includes HF, syncope, AVR, asthma   Clinical Impression   Mr. Charles Riggs is a 66 year old man s/p shoulder replacement without functional use of right dominant upper extremity secondary to effects of surgery and interscalene block and shoulder precautions. Therapist provided education and instruction to patient and spouse in regards to exercises, precautions, positioning, donning upper extremity clothing and bathing while maintaining shoulder precautions, ice and edema management with cuff and cooler and donning/doffing sling. Patient and spouse verbalized understanding and demonstrated as needed. Patient needed assistance to donn shirt, underwear, pants, socks and shoes and provided with instruction on compensatory strategies to perform ADLs. Patient to follow up with MD for further therapy needs.        Recommendations for follow up therapy are one component of a multi-disciplinary discharge planning process, led by the attending physician.  Recommendations may be updated based on patient status, additional functional criteria and insurance authorization.   Follow Up Recommendations  Follow physician's recommendations for discharge plan and follow up therapies    Assistance Recommended at Discharge Intermittent Supervision/Assistance  Patient can return home with the following A little help with bathing/dressing/bathroom;Assistance with cooking/housework    Functional Status Assessment  Patient has had a recent decline in their functional status and demonstrates the ability to make significant improvements in function in a reasonable and predictable amount of time.  Equipment Recommendations  None recommended by OT    Recommendations for Other Services       Precautions / Restrictions  Precautions Precautions: Shoulder Type of Shoulder Precautions: No AROM, PROM Shoulder Interventions: Shoulder sling/immobilizer;At all times;Off for dressing/bathing/exercises Precaution Booklet Issued:  (handouts) Required Braces or Orthoses: Sling Restrictions Weight Bearing Restrictions: Yes RUE Weight Bearing: Non weight bearing         Balance Overall balance assessment: No apparent balance deficits (not formally assessed)                                              Vision Patient Visual Report: No change from baseline              Pertinent Vitals/Pain Pain Assessment: No/denies pain (secondary to block)     Hand Dominance Left   Extremity/Trunk Assessment Upper Extremity Assessment Upper Extremity Assessment: RUE deficits/detail RUE Deficits / Details: no signifant AROM secondary to interscalene block RUE Sensation: decreased light touch   Lower Extremity Assessment Lower Extremity Assessment: Overall WFL for tasks assessed   Cervical / Trunk Assessment Cervical / Trunk Assessment: Normal   Communication Communication Communication: No difficulties   Cognition Arousal/Alertness: Awake/alert Behavior During Therapy: WFL for tasks assessed/performed Overall Cognitive Status: Within Functional Limits for tasks assessed                                       General Comments       Exercises     Shoulder Instructions Shoulder Instructions Donning/doffing shirt without moving shoulder: Caregiver independent with task Method for sponge bathing under operated UE: Patient able to independently direct caregiver Donning/doffing sling/immobilizer: Patient able to independently direct caregiver Correct positioning of sling/immobilizer: Patient able to independently  direct caregiver ROM for elbow, wrist and digits of operated UE: Independent Sling wearing schedule (on at all times/off for ADL's): Independent Proper  positioning of operated UE when showering: Independent Dressing change: Independent Positioning of UE while sleeping: [place] expects to be discharged to:: Private residence Living Arrangements: Spouse/significant other Available Help at Discharge: Available PRN/intermittently;Family                                    Prior Functioning/Environment Prior Level of Function : Independent/Modified Independent                        OT Problem List: Decreased strength;Decreased range of motion;Impaired UE functional use      OT Treatment/Interventions:      OT Goals(Current goals can be found in the care plan section) Acute Rehab OT Goals OT Goal Formulation: All assessment and education complete, DC therapy  OT Frequency:      Co-evaluation              AM-PAC OT "6 Clicks" Daily Activity     Outcome Measure Help from another person eating meals?: A Little Help from another person taking care of personal grooming?: A Little Help from another person toileting, which includes using toliet, bedpan, or urinal?: None Help from another person bathing (including washing, rinsing, drying)?: A Little Help from another person to put on and taking off regular upper body clothing?: A Lot Help from another person to put on and taking off regular lower body clothing?: A Little 6 Click Score: 18   End of Session Nurse Communication:  (OT education complete)  Activity Tolerance: Patient tolerated treatment well Patient left: in chair;with family/visitor present  OT Visit Diagnosis: Muscle weakness (generalized) (M62.81)                Time: 3570-1779 OT Time Calculation (min): 30 min Charges:  OT General Charges $OT Visit: 1 Visit OT Evaluation $OT Eval Low Complexity: 1 Low OT Treatments $Self Care/Home Management : 8-22 mins  Kamaree Wheatley, OTR/L Acute Care Rehab Services  Office [phone] Pager: [phone]   Lenward Chancellor 06/01/2021, 11:23 AM

## 2021-06-01 NOTE — Transfer of Care (Signed)
Immediate Anesthesia Transfer of Care Note  Patient: Suhaas Agena.  Procedure(s) Performed: REVERSE SHOULDER ARTHROPLASTY (Right: Shoulder)  Patient Location: PACU  Anesthesia Type:GA combined with regional for post-op pain  Level of Consciousness: awake, alert , oriented and patient cooperative  Airway & Oxygen Therapy: Patient Spontanous Breathing and Patient connected to face mask  Post-op Assessment: Report given to RN and Post -op Vital signs reviewed and stable  Post vital signs: Reviewed and stable  Last Vitals:  Vitals Value Taken Time  BP    Temp    Pulse 68 06/01/21 0920  Resp 18 06/01/21 0920  SpO2 97 % 06/01/21 0920  Vitals shown include unvalidated device data.  Last Pain:  Vitals:   06/01/21 0604  TempSrc:   PainSc: 3       Patients Stated Pain Goal: 3 (31/51/76 1607)  Complications: No notable events documented.

## 2021-06-01 NOTE — Anesthesia Postprocedure Evaluation (Signed)
Anesthesia Post Note  Patient: Charles Riggs.  Procedure(s) Performed: REVERSE SHOULDER ARTHROPLASTY (Right: Shoulder)     Patient location during evaluation: PACU Anesthesia Type: General Level of consciousness: awake and alert, patient cooperative and oriented Pain management: pain level controlled Vital Signs Assessment: post-procedure vital signs reviewed and stable Respiratory status: spontaneous breathing, nonlabored ventilation and respiratory function stable Cardiovascular status: blood pressure returned to baseline and stable Postop Assessment: no apparent nausea or vomiting Anesthetic complications: no   No notable events documented.  Last Vitals:  Vitals:   06/01/21 0920 06/01/21 0930  BP: 108/67 115/74  Pulse: 68 66  Resp: 18 12  Temp: 36.8 C   SpO2: 97% 100%    Last Pain:  Vitals:   06/01/21 0604  TempSrc:   PainSc: 3                  Jakaya Jacobowitz,E. Sherica Paternostro

## 2021-06-01 NOTE — Anesthesia Procedure Notes (Signed)
Anesthesia Regional Block: Interscalene brachial plexus block   Pre-Anesthetic Checklist: , timeout performed,  Correct Patient, Correct Site, Correct Laterality,  Correct Procedure, Correct Position, site marked,  Risks and benefits discussed,  Surgical consent,  Pre-op evaluation,  At surgeon's request and post-op pain management  Laterality: Right and Upper  Prep: chloraprep       Needles:  Injection technique: Single-shot  Needle Type: Echogenic Needle     Needle Length: 9cm  Needle Gauge: 21     Additional Needles:   Procedures:,,,, ultrasound used (permanent image in chart),,    Narrative:  Start time: 06/01/2021 7:06 AM End time: 06/01/2021 7:12 AM Injection made incrementally with aspirations every 5 mL.  Performed by: Personally  Anesthesiologist: Annye Asa, MD  Additional Notes: Pt identified in Holding room.  Monitors applied. Working IV access confirmed. Sterile prep R clavicle and neck.  #21ga ECHOgenic Arrow block needle to interscalene brachial plexus with US guidance.  10cc 0.5% Bupivacaine with 1:200k epi and Exparel injected incrementally after negative test dose.  Patient asymptomatic, VSS, no heme aspirated, tolerated well.   Jenita Seashore, MD

## 2021-06-01 NOTE — H&P (Signed)
Charles Riggs. is an 66 y.o. male.   Chief Complaint: R shoulder pain and dysfunction HPI: R shoulder rotator cuff tear with arthropathy failed conservative treatment.  Past Medical History:  Diagnosis Date   Acute on chronic diastolic heart failure (HCC)    Anemia in chronic kidney disease    Anxiety    Aortic insufficiency    Arthritis    Asthma    CHF (congestive heart failure) (HCC)    CKD (chronic kidney disease)    Dyspnea    Essential hypertension    Nerve pain    Non-rheumatic aortic regurgitation    Obstructive sleep apnea    cpap   Quadricuspid aortic valve    S/P minimally invasive aortic valve replacement with bioprosthetic valve 12/16/2018   25 mm Edwards Inspiris Resilia stented bovine pericardial tissue valve via right mini thoracotomy approach   Severe aortic insufficiency    SOB (shortness of breath)    Syncope 12/2018    Past Surgical History:  Procedure Laterality Date   AORTIC VALVE REPLACEMENT N/A 12/16/2018   Procedure: MINIMALLY INVASIVE AORTIC VALVE REPLACEMENT (AVR) using Inspiris Aortic valve 50mm.;  Surgeon: Rexene Alberts, MD;  Location: [facility] OR;  Service: Open Heart Surgery;  Laterality: N/A;   BACK SURGERY     HERNIA REPAIR     IR THORACENTESIS ASP PLEURAL SPACE W/IMG GUIDE  12/29/2018   RIB PLATING  12/16/2018   Procedure: Rib Plating;  Surgeon: Rexene Alberts, MD;  Location: [place];  Service: Open Heart Surgery;;   RIGHT/LEFT HEART CATH AND CORONARY ANGIOGRAPHY N/A 10/14/2018   Procedure: RIGHT/LEFT HEART CATH AND CORONARY ANGIOGRAPHY;  Surgeon: Nigel Mormon, MD;  Location: [place] [facility];  Service: Cardiovascular;  Laterality: N/A;   TEE WITHOUT CARDIOVERSION N/A 11/11/2018   Procedure: TRANSESOPHAGEAL ECHOCARDIOGRAM (TEE);  Surgeon: Nigel Mormon, MD;  Location: [facility] ENDOSCOPY;  Service: Cardiovascular;  Laterality: N/A;   TEE WITHOUT CARDIOVERSION N/A 12/16/2018   Procedure: TRANSESOPHAGEAL ECHOCARDIOGRAM (TEE);  Surgeon:  Rexene Alberts, MD;  Location: [place];  Service: Open Heart Surgery;  Laterality: N/A;   TOTAL HIP ARTHROPLASTY Left 03/09/2019   Procedure: LEFT TOTAL HIP ARTHROPLASTY ANTERIOR APPROACH;  Surgeon: Leandrew Koyanagi, MD;  Location: [place];  Service: Orthopedics;  Laterality: Left;    Family History  Problem Relation Age of Onset   COPD Father    Cancer Brother    Social History:  reports that he quit smoking about 12 years ago. His smoking use included cigarettes. He has a 7.00 pack-year smoking history. He has never used smokeless tobacco. He reports that he does not drink alcohol and does not use drugs.  Allergies: No Known Allergies  Medications Prior to Admission  Medication Sig Dispense Refill   albuterol (PROVENTIL HFA;VENTOLIN HFA) 108 (90 Base) MCG/ACT inhaler Inhale 2 puffs into the lungs every 6 (six) hours as needed for wheezing or shortness of breath.     albuterol (PROVENTIL) (2.5 MG/3ML) 0.083% nebulizer solution Take 3 mLs (2.5 mg total) by nebulization every 6 (six) hours as needed for wheezing or shortness of breath. 75 mL 12   amLODipine (NORVASC) 10 MG tablet Take 10 mg by mouth daily.     aspirin EC 81 MG tablet Take 1 tablet (81 mg total) by mouth daily. 90 tablet 1   Atogepant (QULIPTA) 60 MG TABS Take 60 mg by mouth daily. 30 tablet 5   baclofen (LIORESAL) 10 MG tablet Take 10 mg by  mouth at bedtime as needed for muscle spasms.     budesonide-formoterol (SYMBICORT) 160-4.5 MCG/ACT inhaler Inhale 2 puffs into the lungs 2 (two) times daily.     carvedilol (COREG) 6.25 MG tablet TAKE ONE TABLET BY MOUTH TWICE A DAY 180 tablet 3   Cholecalciferol (VITAMIN D3) 50 MCG (2000 UT) TABS Take 2,000 Units by mouth 2 (two) times daily.     ferrous sulfate 325 (65 FE) MG tablet Take 325 mg by mouth daily with breakfast.     fluticasone (FLONASE) 50 MCG/ACT nasal spray Place 2 sprays into both nostrils daily as needed for allergies or rhinitis.     Fluticasone-Salmeterol (ADVAIR)  250-50 MCG/DOSE AEPB Inhale 1 puff into the lungs in the morning and at bedtime.     guaiFENesin (MUCINEX) 600 MG 12 hr tablet Take 1,200 mg by mouth 2 (two) times daily.     losartan (COZAAR) 50 MG tablet Take 25 mg by mouth daily.     metoprolol tartrate (LOPRESSOR) 50 MG tablet Take 50 mg by mouth 2 (two) times daily.     montelukast (SINGULAIR) 10 MG tablet Take 10 mg by mouth daily.     omeprazole (PRILOSEC) 40 MG capsule Take 40 mg by mouth daily.     polycarbophil (FIBERCON) 625 MG tablet Take 625 mg by mouth in the morning and at bedtime.     rosuvastatin (CRESTOR) 20 MG tablet Take 10 mg by mouth at bedtime.     sertraline (ZOLOFT) 100 MG tablet Take 100 mg by mouth daily.     Ubrogepant (UBRELVY) 100 MG TABS Take 1 tablet by mouth as needed (May repeat in 2 hours if needed.  Maximum 2 tablets in 24 hours.). 10 tablet 1   Erenumab-aooe (AIMOVIG) 70 MG/ML SOAJ Inject 70 mg into the skin every 28 (twenty-eight) days. (Patient not taking: Reported on 05/18/2021) 1 pen 11   methocarbamol (ROBAXIN) 750 MG tablet Take 1 tablet (750 mg total) by mouth 2 (two) times daily as needed for muscle spasms. (Patient not taking: Reported on 05/18/2021) 60 tablet 0   rosuvastatin (CRESTOR) 10 MG tablet Take 1 tablet (10 mg total) by mouth daily. (Patient not taking: Reported on 05/18/2021) 90 tablet 1    No results found for this or any previous visit (from the past 48 hour(s)). No results found.  Review of Systems  All other systems reviewed and are negative.  Blood pressure 115/83, pulse 69, temperature 98.9 F (37.2 C), temperature source Oral, resp. rate 12, height 5\' 11"  (1.803 m), weight 120.2 kg, SpO2 97 %. Physical Exam Constitutional:      Appearance: He is well-developed.  HENT:     Head: Atraumatic.  Eyes:     Extraocular Movements: Extraocular movements intact.  Cardiovascular:     Pulses: Normal pulses.  Pulmonary:     Effort: Pulmonary effort is normal.  Skin:    General:  Skin is warm and dry.  Neurological:     Mental Status: He is alert and oriented to person, place, and time.  Psychiatric:        Mood and Affect: Mood normal.     Assessment/Plan R shoulder rotator cuff tear with arthropathy failed conservative treatment. Plan R reverse TSA Risks / benefits of surgery discussed Consent on chart  NPO for OR Preop antibiotics   Rhae Hammock, MD 06/01/2021, 7:14 AM

## 2021-06-01 NOTE — Op Note (Signed)
Procedure(s): REVERSE SHOULDER ARTHROPLASTY Procedure Note  Charles Riggs. male 66 y.o. 06/01/2021  Preoperative diagnosis: Right shoulder advanced arthropathy with associated rotator cuff disease  Postoperative diagnosis: Same  Procedure(s) and Anesthesia Type:    * REVERSE SHOULDER ARTHROPLASTY - Choice   Indications:  66 y.o. male  With advanced right shoulder arthropathy with associated rotator cuff tear after an injury at work.  He failed conservative management and pain and dysfunction interfered with quality of life .  Surgeon: Rhae Hammock   Assistants: Sheryle Hail PA-C Amber was present and scrubbed throughout the procedure and was essential in positioning, retraction, exposure, and closure)  Anesthesia: General endotracheal anesthesia with preoperative interscalene block given by the attending anesthesiologist    Procedure Detail  REVERSE SHOULDER ARTHROPLASTY   Estimated Blood Loss:  200 mL         Drains: none  Blood Given: none          Specimens: none        Complications:  * No complications entered in OR log *         Disposition: PACU - hemodynamically stable.         Condition: stable      OPERATIVE FINDINGS:  A DJO Altivate pressfit reverse total shoulder arthroplasty was placed with a  size 14 stem, a 32 standard glenosphere, and a +4-mm poly insert. The base plate  fixation was excellent.  PROCEDURE: The patient was identified in the preoperative holding area  where I personally marked the operative site after verifying site, side,  and procedure with the patient. An interscalene block given by  the attending anesthesiologist in the holding area and the patient was taken back to the operating room where all extremities were  carefully padded in position after general anesthesia was induced. She  was placed in a beach-chair position and the operative upper extremity was  prepped and draped in a standard sterile fashion. An  approximately 10-  cm incision was made from the tip of the coracoid process to the center  point of the humerus at the level of the axilla. Dissection was carried  down through subcutaneous tissues to the level of the cephalic vein  which was taken laterally with the deltoid. The pectoralis major was  retracted medially. The subdeltoid space was developed and the lateral  edge of the conjoined tendon was identified. The undersurface of  conjoined tendon was palpated and the musculocutaneous nerve was not in  the field. Retractor was placed underneath the conjoined and second  retractor was placed lateral into the deltoid. The circumflex humeral  artery and vessels were identified and clamped and coagulated. The  biceps tendon was tenodesed to the upper border the pectoralis major.  The subscapularis was taken down as a peel with the underlying capsule.  The  joint was then gently externally rotated while the capsule was released  from the humeral neck around to just beyond the 6 o'clock position. At  this point, the joint was dislocated and the humeral head was presented  into the wound. The excessive osteophyte formation was removed with a  large rongeur.  The cutting guide was used to make the appropriate  head cut and the head was saved for potentially bone grafting.  The glenoid was exposed with the arm in an  abducted extended position. The anterior and posterior labrum were  completely excised and the capsule was released circumferentially to  allow for exposure of the glenoid  for preparation. The 2.5 mm drill was  placed using the guide in 5-10 inferior angulation and the tap was then advanced in the same hole. Small and large reamers were then used. The tap was then removed and the Metaglene was then screwed in with excellent purchase.  The peripheral guide was then used to drilled measured and filled peripheral locking screws. The size 32 standard glenosphere was then impacted on the  [facility] taper and the central screw was placed. The humerus was then again exposed and the diaphyseal reamers were used followed by the metaphyseal reamers. The final broach was left in place in the proximal trial was placed. The joint was reduced and with this implant it was felt that soft tissue tensioning was appropriate with excellent stability and excellent range of motion. Therefore, final humeral stem was placed press-fit.  And then the trial polyethylene inserts were tested again and the above implant was felt to be the most appropriate for final insertion. The joint was reduced taken through full range of motion and felt to be stable. Soft tissue tension was appropriate.  The joint was then copiously irrigated with pulse  lavage and the wound was then closed. The subscapularis was repaired with 2 labral tapes through drill tunnels.  Skin was closed with 2-0 Vicryl in a deep dermal layer and 4-0  Monocryl for skin closure. Steri-Strips were applied. Sterile  dressings were then applied as well as a sling. The patient was allowed  to awaken from general anesthesia, transferred to stretcher, and taken  to recovery room in stable condition.   POSTOPERATIVE PLAN: The patient will be observed in the recovery room and if his pain is well controlled with regional block and he is hemodynamically stable he could be discharged home today with family.

## 2021-06-01 NOTE — Discharge Instructions (Addendum)

## 2021-06-01 NOTE — Anesthesia Procedure Notes (Signed)
Procedure Name: Intubation Date/Time: 06/01/2021 7:29 AM Performed by: Claudia Desanctis, CRNA Pre-anesthesia Checklist: Patient identified, Emergency Drugs available, Suction available and Patient being monitored Patient Re-evaluated:Patient Re-evaluated prior to induction Oxygen Delivery Method: Circle system utilized Preoxygenation: Pre-oxygenation with 100% oxygen Induction Type: IV induction Ventilation: Mask ventilation with difficulty Laryngoscope Size: 2 and Miller Grade View: Grade I Tube type: Oral Tube size: 7.5 mm Number of attempts: 1 Airway Equipment and Method: Stylet Placement Confirmation: ETT inserted through vocal cords under direct vision, positive ETCO2 and breath sounds checked- equal and bilateral Secured at: 22 cm Tube secured with: Tape Dental Injury: Teeth and Oropharynx as per pre-operative assessment  Comments: Full beard, mask straps used

## 2021-06-02 ENCOUNTER — Encounter (HOSPITAL_COMMUNITY): Payer: Self-pay | Admitting: Orthopedic Surgery

## 2021-06-06 NOTE — Progress Notes (Signed)
New Referral started, Old one exp. 06/26/21

## 2021-06-28 ENCOUNTER — Telehealth: Payer: No Typology Code available for payment source | Admitting: Neurology

## 2021-08-03 NOTE — Progress Notes (Signed)
Per rep at the [facility], The Referral is approved, in processing now to get the [place] Number.

## 2021-08-07 NOTE — Progress Notes (Unsigned)
NEUROLOGY FOLLOW UP OFFICE NOTE  Charles Riggs. [id number]  Assessment/Plan:   1.         Migraine without aura, without status migrainosus, not intratable 2.         OSA   1.         Migraine prevention:  He wants to avoid needles, so will start Qulipta 60mg  daily 2.         Migraine rescue:  Ubrelvy 100mg  with naproxen 500mg  (or Tylenol) 3.         Limit use of pain relievers to no more than 2 days out of week to prevent risk of rebound or medication-overuse headache. 4.         Keep headache diary 5.         Sleep hygiene 6.         Follow up 6 months.  Subjective:  Charles Riggs. Is a 66 year old left-handed male with CHF, CKD, HTN, and OSA who follows up for migraines.   UPDATE: Charles Riggs in end of August.  Intensity:  severe Duration:  usually 30 minutes later starts feeling relief Frequency:  3 days a week Increased stress may be contributing to the headaches.   Rescue protocol:  Takes 2 Tylenols with the [place] as [name] alone not as effective. Current NSAIDS:  ASA 81mg  daily Current analgesics:  Acetaminophen 500mg  Current triptans:  Sumatriptan 100mg  Current ergotamine:  none Current anti-emetic:  none Current muscle relaxants:  Robaxin Current anti-anxiolytic:  none Current sleep aide:  none Current Antihypertensive medications:  Amlodipine, carvedilol Current Antidepressant medications:  none Current Anticonvulsant medications:  none Current anti-CGRP:  Ubrelvy 100mg , Qulipta 60mg  daily Current Vitamins/Herbal/Supplements:  Ferrous sulfate Current Antihistamines/Decongestants:  none Other therapy:  none Hormone/birth control:  none   Caffeine:  Rarely drinks coffee or soda with caffeine Diet:  Drinks 24 oz water daily, otherwise juice. Exercise:  yes Depression:  yes; Anxiety:  yes Other pain:  Low back pain (lumbar stenosis), bilateral hip pain Sleep hygiene:  Poor even with CPAP.   HISTORY:  He has had headaches for several  years.  He reports a moderate non-throbbing frontal pain.  They are associated with photophobia, phonophobia, nausea, blurred vision and eyes hurt.  No associated vomiting or weakness. It often lasts all day and occurs 3 to 4 times a week.  He treats with Tylenol 500mg  with mild efficacy.  He has chronic hip, back and leg pain (arthritis, lumbar stenosis, plantar fasciitis) which may trigger the headache.     Headaches later became throbbing and bitemporal     Past NSAIDS:  meloxicam Past analgesics:  oxycodone Past abortive triptans:  Has tried a triptan Past abortive ergotamine:  none Past muscle relaxants:  methocarbamol Past anti-emetic:  Ondansetron 4mg  Past antihypertensive medications:  metoprolol, furosemide Past antidepressant medications:  Sertraline 100mg , amitriptyline Past anticonvulsant medications:  Gabapentin 400mg  TID, topiramate, maybe Depakote Past anti-CGRP:  Aimovig 70mg  Past vitamins/Herbal/Supplements:  none Past antihistamines/decongestants:  Diphenhydramine, hydroxyzine Other past therapies:  occipital nerve block (effective)     Family history of headache:  no   MRI of brain without contrast on 12/17/2002 reportedly showed "no brain abnormality".  CT Head from 11/22/2007 and 07/17/2016 were personally reviewed and were unremarkable.  PAST MEDICAL HISTORY: Past Medical History:  Diagnosis Date   Acute on chronic diastolic heart failure (HCC)    Anemia in chronic kidney disease    Anxiety    Aortic  insufficiency    Arthritis    Asthma    CHF (congestive heart failure) (HCC)    CKD (chronic kidney disease)    Dyspnea    Essential hypertension    Nerve pain    Non-rheumatic aortic regurgitation    Obstructive sleep apnea    cpap   Quadricuspid aortic valve    S/P minimally invasive aortic valve replacement with bioprosthetic valve 12/16/2018   25 mm Edwards Inspiris Resilia stented bovine pericardial tissue valve via right mini thoracotomy approach    Severe aortic insufficiency    SOB (shortness of breath)    Syncope 12/2018    MEDICATIONS: Current Outpatient Medications on File Prior to Visit  Medication Sig Dispense Refill   albuterol (PROVENTIL HFA;VENTOLIN HFA) 108 (90 Base) MCG/ACT inhaler Inhale 2 puffs into the lungs every 6 (six) hours as needed for wheezing or shortness of breath.     albuterol (PROVENTIL) (2.5 MG/3ML) 0.083% nebulizer solution Take 3 mLs (2.5 mg total) by nebulization every 6 (six) hours as needed for wheezing or shortness of breath. 75 mL 12   amLODipine (NORVASC) 10 MG tablet Take 10 mg by mouth daily.     aspirin EC 81 MG tablet Take 1 tablet (81 mg total) by mouth daily. 90 tablet 1   Atogepant (QULIPTA) 60 MG TABS Take 60 mg by mouth daily. 30 tablet 5   baclofen (LIORESAL) 10 MG tablet Take 10 mg by mouth at bedtime as needed for muscle spasms.     budesonide-formoterol (SYMBICORT) 160-4.5 MCG/ACT inhaler Inhale 2 puffs into the lungs 2 (two) times daily.     carvedilol (COREG) 6.25 MG tablet TAKE ONE TABLET BY MOUTH TWICE A DAY 180 tablet 3   Cholecalciferol (VITAMIN D3) 50 MCG (2000 UT) TABS Take 2,000 Units by mouth 2 (two) times daily.     ferrous sulfate 325 (65 FE) MG tablet Take 325 mg by mouth daily with breakfast.     fluticasone (FLONASE) 50 MCG/ACT nasal spray Place 2 sprays into both nostrils daily as needed for allergies or rhinitis.     Fluticasone-Salmeterol (ADVAIR) 250-50 MCG/DOSE AEPB Inhale 1 puff into the lungs in the morning and at bedtime.     guaiFENesin (MUCINEX) 600 MG 12 hr tablet Take 1,200 mg by mouth 2 (two) times daily.     losartan (COZAAR) 50 MG tablet Take 25 mg by mouth daily.     methocarbamol (ROBAXIN) 500 MG tablet Take 1 tablet (500 mg total) by mouth every 8 (eight) hours as needed for muscle spasms. 30 tablet 0   metoprolol tartrate (LOPRESSOR) 50 MG tablet Take 50 mg by mouth 2 (two) times daily.     montelukast (SINGULAIR) 10 MG tablet Take 10 mg by mouth daily.      omeprazole (PRILOSEC) 40 MG capsule Take 40 mg by mouth daily.     oxyCODONE (ROXICODONE) 5 MG immediate release tablet Take 1 tablet (5 mg total) by mouth every 4 (four) hours as needed for severe pain. 30 tablet 0   polycarbophil (FIBERCON) 625 MG tablet Take 625 mg by mouth in the morning and at bedtime.     rosuvastatin (CRESTOR) 20 MG tablet Take 10 mg by mouth at bedtime.     sertraline (ZOLOFT) 100 MG tablet Take 100 mg by mouth daily.     Ubrogepant (UBRELVY) 100 MG TABS Take 1 tablet by mouth as needed (May repeat in 2 hours if needed.  Maximum 2 tablets in 24 hours.). 10  tablet 1   No current facility-administered medications on file prior to visit.    ALLERGIES: No Known Allergies  FAMILY HISTORY: Family History  Problem Relation Age of Onset   COPD Father    Cancer Brother       Objective:  *** General: No acute distress.  Patient appears ***-groomed.   Head:  Normocephalic/atraumatic Eyes:  Fundi examined but not visualized Neck: supple, no paraspinal tenderness, full range of motion Heart:  Regular rate and rhythm Lungs:  Clear to auscultation bilaterally Back: No paraspinal tenderness Neurological Exam: alert and oriented to person, place, and time.  Speech fluent and not dysarthric, language intact.  CN II-XII intact. Bulk and tone normal, muscle strength 5/5 throughout.  Sensation to light touch intact.  Deep tendon reflexes 2+ throughout, toes downgoing.  Finger to nose testing intact.  Gait normal, Romberg negative.   Metta Clines, DO  CC: ***

## 2021-08-08 ENCOUNTER — Other Ambulatory Visit: Payer: Self-pay

## 2021-08-08 ENCOUNTER — Encounter: Payer: Self-pay | Admitting: Neurology

## 2021-08-08 ENCOUNTER — Ambulatory Visit (INDEPENDENT_AMBULATORY_CARE_PROVIDER_SITE_OTHER): Payer: No Typology Code available for payment source | Admitting: Neurology

## 2021-08-08 VITALS — BP 113/73 | HR 62 | Ht 71.0 in | Wt 275.2 lb

## 2021-08-08 DIAGNOSIS — G43009 Migraine without aura, not intractable, without status migrainosus: Secondary | ICD-10-CM

## 2021-08-08 DIAGNOSIS — G4733 Obstructive sleep apnea (adult) (pediatric): Secondary | ICD-10-CM | POA: Diagnosis not present

## 2021-08-08 MED ORDER — UBRELVY 100 MG PO TABS: 1.0000 | ORAL_TABLET | ORAL | 11 refills | Status: DC | PRN

## 2021-08-08 NOTE — Patient Instructions (Addendum)
Qulipta 60mg  daily ?[name] 100mg  with naproxen 500mg  ?Limit use of pain relievers to no more than 2 days out of week to prevent risk of rebound or medication-overuse headache. ?Keep headache diary ?Follow up with sleep medicine regarding sleep apnea and poor sleep ?Follow up 9 months. ?

## 2021-08-08 NOTE — Progress Notes (Signed)
Medication Samples have been provided to the patient. ? ?Drug name: [name]       Strength: 100 mg        Qty: 2  LOT: [id number]  Exp.Date: 10/2022/11/2021 ? ?Dosing instructions: as needed ? ?The patient has been instructed regarding the correct time, dose, and frequency of taking this medication, including desired effects and most common side effects.  ? ?Venetia Night ?10:03 AM ?08/08/2021  ?

## 2021-12-29 ENCOUNTER — Other Ambulatory Visit: Payer: Self-pay | Admitting: Cardiology

## 2021-12-29 DIAGNOSIS — I10 Essential (primary) hypertension: Secondary | ICD-10-CM

## 2022-01-18 ENCOUNTER — Other Ambulatory Visit: Payer: Self-pay

## 2022-01-18 ENCOUNTER — Telehealth: Payer: Self-pay

## 2022-01-18 MED ORDER — FUROSEMIDE 20 MG PO TABS: 20.0000 mg | ORAL_TABLET | Freq: Every day | ORAL | 3 refills | Status: DC

## 2022-01-18 NOTE — Telephone Encounter (Signed)
Medication has been sent and patient is aware

## 2022-01-18 NOTE — Telephone Encounter (Signed)
Please send lasix 20 mg daily 90 pills X 3 refills and move the appt to next available please.  Thanks MJP

## 2022-03-17 ENCOUNTER — Encounter (HOSPITAL_COMMUNITY): Payer: Self-pay

## 2022-03-17 ENCOUNTER — Emergency Department (HOSPITAL_COMMUNITY)
Admission: EM | Admit: 2022-03-17 | Discharge: 2022-03-17 | Disposition: A | Discharge status: Home / Self Care | Payer: No Typology Code available for payment source | Attending: Emergency Medicine | Admitting: Emergency Medicine

## 2022-03-17 ENCOUNTER — Emergency Department (HOSPITAL_COMMUNITY): Payer: No Typology Code available for payment source

## 2022-03-17 ENCOUNTER — Other Ambulatory Visit: Payer: Self-pay

## 2022-03-17 DIAGNOSIS — N189 Chronic kidney disease, unspecified: Secondary | ICD-10-CM | POA: Diagnosis not present

## 2022-03-17 DIAGNOSIS — I130 Hypertensive heart and chronic kidney disease with heart failure and stage 1 through stage 4 chronic kidney disease, or unspecified chronic kidney disease: Secondary | ICD-10-CM | POA: Diagnosis not present

## 2022-03-17 DIAGNOSIS — X500XXA Overexertion from strenuous movement or load, initial encounter: Secondary | ICD-10-CM | POA: Insufficient documentation

## 2022-03-17 DIAGNOSIS — Z7982 Long term (current) use of aspirin: Secondary | ICD-10-CM | POA: Diagnosis not present

## 2022-03-17 DIAGNOSIS — Z7951 Long term (current) use of inhaled steroids: Secondary | ICD-10-CM | POA: Diagnosis not present

## 2022-03-17 DIAGNOSIS — Y990 Civilian activity done for income or pay: Secondary | ICD-10-CM | POA: Diagnosis not present

## 2022-03-17 DIAGNOSIS — I5030 Unspecified diastolic (congestive) heart failure: Secondary | ICD-10-CM | POA: Insufficient documentation

## 2022-03-17 DIAGNOSIS — I2510 Atherosclerotic heart disease of native coronary artery without angina pectoris: Secondary | ICD-10-CM | POA: Diagnosis not present

## 2022-03-17 DIAGNOSIS — J45909 Unspecified asthma, uncomplicated: Secondary | ICD-10-CM | POA: Insufficient documentation

## 2022-03-17 DIAGNOSIS — M545 Low back pain, unspecified: Secondary | ICD-10-CM | POA: Diagnosis present

## 2022-03-17 DIAGNOSIS — M5441 Lumbago with sciatica, right side: Secondary | ICD-10-CM | POA: Insufficient documentation

## 2022-03-17 DIAGNOSIS — M5442 Lumbago with sciatica, left side: Secondary | ICD-10-CM | POA: Diagnosis not present

## 2022-03-17 DIAGNOSIS — Z79899 Other long term (current) drug therapy: Secondary | ICD-10-CM | POA: Diagnosis not present

## 2022-03-17 MED ORDER — METHYLPREDNISOLONE 4 MG PO TBPK: ORAL_TABLET | ORAL | 0 refills | Status: DC

## 2022-03-17 MED ORDER — OXYCODONE HCL 5 MG PO TABS: 5.0000 mg | ORAL_TABLET | Freq: Four times a day (QID) | ORAL | 0 refills | Status: DC | PRN

## 2022-03-17 MED ORDER — LIDOCAINE 5 % EX PTCH: 1.0000 | MEDICATED_PATCH | CUTANEOUS | 0 refills | Status: AC

## 2022-03-17 MED ORDER — OXYCODONE HCL 5 MG PO TABS
5.0000 mg | ORAL_TABLET | Freq: Once | ORAL | Status: AC
  Administered 2022-03-17: 5 mg via ORAL
  Filled 2022-03-17: qty 1

## 2022-03-17 MED ORDER — KETOROLAC TROMETHAMINE 30 MG/ML IJ SOLN
15.0000 mg | Freq: Once | INTRAMUSCULAR | Status: AC
Start: 2022-03-17 — End: 2022-03-17
  Administered 2022-03-17: 15 mg via INTRAVENOUS
  Filled 2022-03-17: qty 1

## 2022-03-17 NOTE — ED Triage Notes (Signed)
Patient c/o bilateral lower back pain and states the pain radiates down both legs x 2 days.

## 2022-03-17 NOTE — Discharge Instructions (Addendum)
You were seen in the emergency department for lower back pain.  As we discussed, your pain could be related to a pulled muscle, but also related to degenerative changes in your spine. Your x-ray didn't show any broken bones.   I have given you an anti-inflammatory and a dose of stronger pain medication that you can take on top of ibuprofen/or tylenol for break through pain. I'm writing you a short course of stronger medicine for home as well as steroids, and patches for your lower back.  It's important you follow up with orthopedics/spine if your symptoms persist past 4 weeks.   Continue to monitor how you're doing and return to the ER for new or worsening symptoms.

## 2022-03-17 NOTE — ED Provider Notes (Signed)
[facility] DEPT Provider Note   CSN: [id number] Arrival date & time: 03/17/22  1536     History  Chief Complaint  Patient presents with   Back Pain    Charles Riggs. is a 66 y.o. male with history of aortic insufficiency s/p bioprosthetic valve replacement, diastolic HF, HTN, OSA on CPAP, CKD, CAD and asthma who presents to the emergency department complaining of back pain. States the pain radiates down his bilateral legs and has been doing so for the past 2 days.  States the pain started after he was at work and he was lifting a pallet of heavy items.  He immediately felt a strain in his lower back.  Denies any numbness, weakness, saddle anesthesia, urinary retention, urine or bowel incontinence.  No fevers or chills.  No history of cancer or IV drug use.   Back Pain      Home Medications Prior to Admission medications   Medication Sig Start Date End Date Taking? Authorizing Provider  lidocaine (LIDODERM) 5 % Place 1 patch onto the skin daily. Remove & Discard patch within 12 hours or as directed by MD 03/17/22  Yes Patricie Geeslin T, PA-C  methylPREDNISolone (MEDROL DOSEPAK) 4 MG TBPK tablet Take per package instructions 03/17/22  Yes Nelsy Madonna T, PA-C  oxyCODONE (ROXICODONE) 5 MG immediate release tablet Take 1 tablet (5 mg total) by mouth every 6 (six) hours as needed for severe pain or breakthrough pain. 03/17/22  Yes Kamilo Och T, PA-C  albuterol (PROVENTIL HFA;VENTOLIN HFA) 108 (90 Base) MCG/ACT inhaler Inhale 2 puffs into the lungs every 6 (six) hours as needed for wheezing or shortness of breath.    [provider]  albuterol (PROVENTIL) (2.5 MG/3ML) 0.083% nebulizer solution Take 3 mLs (2.5 mg total) by nebulization every 6 (six) hours as needed for wheezing or shortness of breath. 11/14/17   Noemi Chapel, MD  amLODipine (NORVASC) 10 MG tablet Take 10 mg by mouth daily.    [provider]  aspirin EC 81 MG  tablet Take 1 tablet (81 mg total) by mouth daily. 03/13/19   [name], Manish J, MD  Atogepant (QULIPTA) 60 MG TABS Take 60 mg by mouth daily. 01/23/21   Pieter Partridge, DO  baclofen (LIORESAL) 10 MG tablet Take 10 mg by mouth at bedtime as needed for muscle spasms. 02/23/21   [provider]  budesonide-formoterol (SYMBICORT) 160-4.5 MCG/ACT inhaler Inhale 2 puffs into the lungs 2 (two) times daily.    [provider]  carvedilol (COREG) 6.25 MG tablet TAKE ONE TABLET BY MOUTH TWICE A DAY 12/29/21   [name], Manish J, MD  Cholecalciferol (VITAMIN D3) 50 MCG (2000 UT) TABS Take 2,000 Units by mouth 2 (two) times daily.    [provider]  ferrous sulfate 325 (65 FE) MG tablet Take 325 mg by mouth daily with breakfast.    [provider]  fluticasone (FLONASE) 50 MCG/ACT nasal spray Place 2 sprays into both nostrils daily as needed for allergies or rhinitis.    [provider]  Fluticasone-Salmeterol (ADVAIR) 250-50 MCG/DOSE AEPB Inhale 1 puff into the lungs in the morning and at bedtime. 02/29/20   [provider]  furosemide (LASIX) 20 MG tablet Take 1 tablet (20 mg total) by mouth daily. 01/18/22   [name], Reynold Bowen, MD  guaiFENesin (MUCINEX) 600 MG 12 hr tablet Take 1,200 mg by mouth 2 (two) times daily.    [provider]  losartan (COZAAR) 50  MG tablet Take 25 mg by mouth daily. 12/08/19   [provider]  methocarbamol (ROBAXIN) 500 MG tablet Take 1 tablet (500 mg total) by mouth every 8 (eight) hours as needed for muscle spasms. 06/01/21   Porterfield, Amber, PA-C  metoprolol tartrate (LOPRESSOR) 50 MG tablet Take 50 mg by mouth 2 (two) times daily.    [provider]  montelukast (SINGULAIR) 10 MG tablet Take 10 mg by mouth daily.    [provider]  omeprazole (PRILOSEC) 40 MG capsule Take 40 mg by mouth daily.    [provider]  polycarbophil (FIBERCON) 625 MG tablet Take 625 mg by mouth in  the morning and at bedtime.    [provider]  rosuvastatin (CRESTOR) 20 MG tablet Take 10 mg by mouth at bedtime.    [provider]  sertraline (ZOLOFT) 100 MG tablet Take 100 mg by mouth daily. 07/29/19   [provider]  Ubrogepant (UBRELVY) 100 MG TABS Take 1 tablet by mouth as needed (May repeat in 2 hours if needed.  Maximum 2 tablets in 24 hours.). 08/08/21   Pieter Partridge, DO      Allergies    Patient has no known allergies.    Review of Systems   Review of Systems  Musculoskeletal:  Positive for back pain.  All other systems reviewed and are negative.   Physical Exam Updated Vital Signs BP 129/84 (BP Location: Left Arm)   Pulse 68   Temp 98.2 F (36.8 C) (Oral)   Resp 19   Ht 5\' 10"  (1.778 m)   Wt 125.2 kg   SpO2 99%   BMI 39.60 kg/m  Physical Exam Vitals and nursing note reviewed.  Constitutional:      Appearance: Normal appearance.  HENT:     Head: Normocephalic and atraumatic.  Eyes:     Conjunctiva/sclera: Conjunctivae normal.  Pulmonary:     Effort: Pulmonary effort is normal. No respiratory distress.  Musculoskeletal:     Comments: Full passive ROM of all regions of spine.  No midline spinal tenderness, step-offs or crepitus.  Strength 5/5 in all extremities.  Sensation intact in all extremities. Positive straight leg raise on both legs.   Skin:    General: Skin is warm and dry.  Neurological:     Mental Status: He is alert.  Psychiatric:        Mood and Affect: Mood normal.        Behavior: Behavior normal.     ED Results / Procedures / Treatments   Labs (all labs ordered are listed, but only abnormal results are displayed) Labs Reviewed - No data to display  EKG None  Radiology DG Lumbar Spine Complete  Result Date: 03/17/2022 CLINICAL DATA:  back pain EXAM: LUMBAR SPINE - COMPLETE 4+ VIEW COMPARISON:  CT abdomen pelvis 11/18/2018 FINDINGS: Limited evaluation due to overlapping osseous structures and overlying  soft tissues. Five non-rib-bearing lumbar vertebral bodies multilevel moderate degenerative changes spine with osteophyte formation facet arthropathy. Intervertebral disc space narrowing most prominent at the L4-L5 L5-S1 level. There is no evidence of lumbar spine fracture. Alignment is normal. Total left hip arthroplasty partially visualized. Calcifications overlying the expected region of the renal shadows could represent nephrolithiasis. IMPRESSION: 1. No acute displaced fracture or traumatic listhesis of the lumbar spine in a patient with multilevel degenerative changes. Limited evaluation due to overlapping osseous structures and overlying soft tissues. 2. Calcifications overlying the expected region of the renal shadows could represent nephrolithiasis.  Electronically Signed   By: Iven Finn M.D.   On: 03/17/2022 17:19    Procedures Procedures    Medications Ordered in ED Medications  ketorolac (TORADOL) 30 MG/ML injection 15 mg (15 mg Intravenous Given 03/17/22 1733)  oxyCODONE (Oxy IR/ROXICODONE) immediate release tablet 5 mg (5 mg Oral Given 03/17/22 1733)    ED Course/ Medical Decision Making/ A&P                           Medical Decision Making Amount and/or Complexity of Data Reviewed Radiology: ordered.  Risk Prescription drug management.  This patient is a 66 y.o. male who presents to the ED for concern of back pain. No urinary symptoms.   Differential diagnoses prior to evaluation: Fracture (acute/chronic), muscle strain, cauda equina, spinal stenosis, DDD, ligamentous injury, disk herniation, metastatic cancer, vertebral osteomyelitis, kidney stone, pyelonephritis, AAA, pancreatitis, bowel obstruction, meningitis.  Past Medical History / Social History / Additional history: Chart reviewed. Pertinent results include: aortic insufficiency s/p bioprosthetic valve replacement, diastolic HF, HTN, OSA on CPAP, CKD, CAD   Physical Exam: Physical exam performed. The  pertinent findings include: No midline spinal tenderness, step-offs or crepitus. Normal strength and sensation in BLE. Positive straight leg raise bilaterally.   Imaging: Lumbar x-ray without acute findings.   Medications / Treatment: Given toradol and roxicodone   Disposition: After consideration of the diagnostic results and the patients response to treatment, I feel that emergency department workup does not suggest an emergent condition requiring admission or immediate intervention beyond what has been performed at this time. No neurological deficits and normal neuro exam.  Patient can walk but states is painful.  No loss of bowel or bladder control.  No concern for cauda equina.  No fever, night sweats, weight loss, h/o cancer, IVDU.  RICE protocol and pain medicine indicated and discussed with patient. Discussed spine follow up if symptoms persist. The patient is safe for discharge and has been instructed to return immediately for worsening symptoms, change in symptoms or any other concerns.  Final Clinical Impression(s) / ED Diagnoses Final diagnoses:  Acute bilateral low back pain with bilateral sciatica    Rx / DC Orders ED Discharge Orders          Ordered    oxyCODONE (ROXICODONE) 5 MG immediate release tablet  Every 6 hours PRN        03/17/22 1756    lidocaine (LIDODERM) 5 %  Every 24 hours        03/17/22 1756    methylPREDNISolone (MEDROL DOSEPAK) 4 MG TBPK tablet        03/17/22 1756           Portions of this report may have been transcribed using voice recognition software. Every effort was made to ensure accuracy; however, inadvertent computerized transcription errors may be present.    Kateri Plummer, PA-C 03/17/22 1814    Gareth [name], MD 03/18/22 1505

## 2022-04-04 ENCOUNTER — Ambulatory Visit: Payer: No Typology Code available for payment source | Admitting: Cardiology

## 2022-04-04 ENCOUNTER — Encounter: Payer: Self-pay | Admitting: Cardiology

## 2022-04-04 VITALS — BP 126/81 | HR 70 | Resp 16 | Ht 70.0 in | Wt 270.0 lb

## 2022-04-04 DIAGNOSIS — I10 Essential (primary) hypertension: Secondary | ICD-10-CM

## 2022-04-04 DIAGNOSIS — Z952 Presence of prosthetic heart valve: Secondary | ICD-10-CM

## 2022-04-04 NOTE — Progress Notes (Signed)
Subjective:   Charles Riggs., male    DOB: Jul 06, 1955, 66 y.o.   MRN: [id number]   Chief complaint:  Aortic regurgitation   66 year old African-American male,  34 PY former smoker, OSA, LBBB, h/o asthma, nonobstructive CAD, s/p bioprosthetic AVR (25 mm) through minithoracotomy (12/16/2018 Dr Roxy Manns) for severe AI with quadricuspid aortic valve.    Patient is doing well, denies chest pain, shortness of breath, palpitations, leg edema, orthopnea, PND, TIA/syncope. His only complaint is back pain, for which he is seeing [place].    Current Outpatient Medications:    albuterol (PROVENTIL HFA;VENTOLIN HFA) 108 (90 Base) MCG/ACT inhaler, Inhale 2 puffs into the lungs every 6 (six) hours as needed for wheezing or shortness of breath., Disp: , Rfl:    albuterol (PROVENTIL) (2.5 MG/3ML) 0.083% nebulizer solution, Take 3 mLs (2.5 mg total) by nebulization every 6 (six) hours as needed for wheezing or shortness of breath., Disp: 75 mL, Rfl: 12   amLODipine (NORVASC) 10 MG tablet, Take 10 mg by mouth daily., Disp: , Rfl:    aspirin EC 81 MG tablet, Take 1 tablet (81 mg total) by mouth daily., Disp: 90 tablet, Rfl: 1   Atogepant (QULIPTA) 60 MG TABS, Take 60 mg by mouth daily., Disp: 30 tablet, Rfl: 5   baclofen (LIORESAL) 10 MG tablet, Take 10 mg by mouth at bedtime as needed for muscle spasms., Disp: , Rfl:    budesonide-formoterol (SYMBICORT) 160-4.5 MCG/ACT inhaler, Inhale 2 puffs into the lungs 2 (two) times daily., Disp: , Rfl:    carvedilol (COREG) 6.25 MG tablet, TAKE ONE TABLET BY MOUTH TWICE A DAY, Disp: 180 tablet, Rfl: 3   Cholecalciferol (VITAMIN D3) 50 MCG (2000 UT) TABS, Take 2,000 Units by mouth 2 (two) times daily., Disp: , Rfl:    ferrous sulfate 325 (65 FE) MG tablet, Take 325 mg by mouth daily with breakfast., Disp: , Rfl:    fluticasone (FLONASE) 50 MCG/ACT nasal spray, Place 2 sprays into both nostrils daily as needed for allergies or rhinitis., Disp: , Rfl:     Fluticasone-Salmeterol (ADVAIR) 250-50 MCG/DOSE AEPB, Inhale 1 puff into the lungs in the morning and at bedtime., Disp: , Rfl:    furosemide (LASIX) 20 MG tablet, Take 1 tablet (20 mg total) by mouth daily., Disp: 90 tablet, Rfl: 3   guaiFENesin (MUCINEX) 600 MG 12 hr tablet, Take 1,200 mg by mouth 2 (two) times daily., Disp: , Rfl:    lidocaine (LIDODERM) 5 %, Place 1 patch onto the skin daily. Remove & Discard patch within 12 hours or as directed by MD, Disp: 30 patch, Rfl: 0   losartan (COZAAR) 50 MG tablet, Take 25 mg by mouth daily., Disp: , Rfl:    methocarbamol (ROBAXIN) 500 MG tablet, Take 1 tablet (500 mg total) by mouth every 8 (eight) hours as needed for muscle spasms., Disp: 30 tablet, Rfl: 0   methylPREDNISolone (MEDROL DOSEPAK) 4 MG TBPK tablet, Take per package instructions, Disp: 1 each, Rfl: 0   metoprolol tartrate (LOPRESSOR) 50 MG tablet, Take 50 mg by mouth 2 (two) times daily., Disp: , Rfl:    montelukast (SINGULAIR) 10 MG tablet, Take 10 mg by mouth daily., Disp: , Rfl:    omeprazole (PRILOSEC) 40 MG capsule, Take 40 mg by mouth daily., Disp: , Rfl:    oxyCODONE (ROXICODONE) 5 MG immediate release tablet, Take 1 tablet (5 mg total) by mouth every 6 (six) hours as needed for severe pain or breakthrough  pain., Disp: 5 tablet, Rfl: 0   polycarbophil (FIBERCON) 625 MG tablet, Take 625 mg by mouth in the morning and at bedtime., Disp: , Rfl:    rosuvastatin (CRESTOR) 20 MG tablet, Take 10 mg by mouth at bedtime., Disp: , Rfl:    sertraline (ZOLOFT) 100 MG tablet, Take 100 mg by mouth daily., Disp: , Rfl:    Ubrogepant (UBRELVY) 100 MG TABS, Take 1 tablet by mouth as needed (May repeat in 2 hours if needed.  Maximum 2 tablets in 24 hours.)., Disp: 10 tablet, Rfl: 11  Cardiovascular studies:  EKG 04/04/2022: Sinus rhythm 64 bpm Anteroseptal infarct -age undetermined IVCD  Echocardiogram 10/07/2020:  Normal LV systolic function with visual EF 55-60%. Left ventricle cavity  is  small. Moderate concentric hypertrophy of the left ventricle. Normal  global wall motion. Normal diastolic filling pattern.  Patient has A 33m an Edwards bovine bioprosthetic valve in the aortic  position. Procedure Date: 12/16/2018.  No AV regurgitation. Trace to mild aortic valve  stenosis. Peak velocity  2.11 m/s, Peak Gradient 18 mmHg, Mean Gradient  9.3 mmHg, AVA 1.7cm.  No significant change from 12/29/2018.   Coronary angiogram 10/14/2018: LM: Normal LAD: Normal Ramus: Normal LCx: Normal RCA: Normal   Angiographically normal coronary arteries with no significant CAD Mild pulmonary hypertnension mean PA 26 mmHg   CTA coronary 08/27/2018: 1.  No aortic dissection. 2. Inadequate coaptation of the aortic valve leaflets consistent with severe aortic insufficiency. 3. Coronary artery calcium score 37 Agatston units. This places the patient in the 50th percentile for age and gender, suggesting intermediate risk for future cardiac events. 4. Nonobstructive mild coronary disease. Interpretation of the RCA is somewhat limited by artifact but I think that there is no significant disease.   Recent labs: 05/25/2021: Glucose 112, BUN/Cr 14/1.28. EGFR >60. Na/K 139/4.3.  H/H 13/43. MCV 88. Platelets 218  02/2019: Glucose 155. BUN/Cr 15/1.47. eGFR 51 H/H 9.1/30.1, MCV 80. Platelets 226 HbA1C 5.5%  08/2018: Chol 225, TG 90, HDL 78, LDL 129   Review of Systems  Cardiovascular:  Negative for chest pain, dyspnea on exertion, leg swelling, palpitations and syncope.  Respiratory:  Negative for shortness of breath.   Musculoskeletal:  Positive for back pain, joint pain and neck pain.  Neurological:  Positive for paresthesias.        Vitals:   04/04/22 1136  BP: 126/81  Pulse: 70  Resp: 16  SpO2: 98%    Physical Exam Vitals and nursing note reviewed.  Constitutional:      General: He is not in acute distress. Neck:     Vascular: No JVD.  Cardiovascular:     Rate and  Rhythm: Normal rate and regular rhythm.     Pulses: Intact distal pulses.     Heart sounds: Normal heart sounds. No murmur heard. Pulmonary:     Effort: Pulmonary effort is normal.     Breath sounds: Normal breath sounds. No wheezing or rales.  Chest:     Comments: Right sided herniation of ?pectoral muscle at the site of mini-thoracotomy scar Musculoskeletal:        General: Tenderness (Right shoulder tenderness) present.     Right lower leg: No edema.     Left lower leg: No edema.  Neurological:     General: No focal deficit present.     Cranial Nerves: No cranial nerve deficit.     Sensory: No sensory deficit.         Assessment & Recommendations:  66 year old African-American male,  73 PY former smoker, OSA, LBBB, h/o asthma, s/p bioprosthetic AVR (25 mm) through minithoracotomy (12/16/2018 Dr Roxy Manns) for severe AI with quadricuspid aortic valve.   S/p AVR: 25 mm bioprosthetic valve for quadricuspid valve with severe AI Normal functioning on echocardiogram 09/2020. Continue Aspirin 81 mg daily.   Hypertension: He wants to reduce his medications. Stop coreg. Increase losartan to 2 tabs of 50 mg daily. Check BMP in 1 week, along with lipid panel. If tolerated, will send 100 mg losartan prescription.  F/u in 1 year   Nigel Mormon, MD Pager: [phone] Office: [phone]

## 2022-04-16 ENCOUNTER — Emergency Department (HOSPITAL_COMMUNITY)
Admission: EM | Admit: 2022-04-16 | Discharge: 2022-04-17 | Disposition: A | Discharge status: Home / Self Care | Payer: No Typology Code available for payment source | Attending: Emergency Medicine | Admitting: Emergency Medicine

## 2022-04-16 ENCOUNTER — Emergency Department (HOSPITAL_COMMUNITY): Payer: No Typology Code available for payment source

## 2022-04-16 ENCOUNTER — Encounter (HOSPITAL_COMMUNITY): Payer: Self-pay

## 2022-04-16 ENCOUNTER — Other Ambulatory Visit: Payer: Self-pay

## 2022-04-16 DIAGNOSIS — W19XXXA Unspecified fall, initial encounter: Secondary | ICD-10-CM | POA: Insufficient documentation

## 2022-04-16 DIAGNOSIS — N189 Chronic kidney disease, unspecified: Secondary | ICD-10-CM | POA: Insufficient documentation

## 2022-04-16 DIAGNOSIS — Z79899 Other long term (current) drug therapy: Secondary | ICD-10-CM | POA: Diagnosis not present

## 2022-04-16 DIAGNOSIS — S92354A Nondisplaced fracture of fifth metatarsal bone, right foot, initial encounter for closed fracture: Secondary | ICD-10-CM | POA: Diagnosis not present

## 2022-04-16 DIAGNOSIS — M79671 Pain in right foot: Secondary | ICD-10-CM | POA: Insufficient documentation

## 2022-04-16 DIAGNOSIS — I509 Heart failure, unspecified: Secondary | ICD-10-CM | POA: Insufficient documentation

## 2022-04-16 DIAGNOSIS — S99921A Unspecified injury of right foot, initial encounter: Secondary | ICD-10-CM | POA: Diagnosis present

## 2022-04-16 DIAGNOSIS — Z7982 Long term (current) use of aspirin: Secondary | ICD-10-CM | POA: Insufficient documentation
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
## 2022-04-17 MED ORDER — OXYCODONE-ACETAMINOPHEN 5-325 MG PO TABS: 1.0000 | ORAL_TABLET | Freq: Three times a day (TID) | ORAL | 0 refills | Status: DC | PRN

## 2022-04-17 MED ORDER — ONDANSETRON HCL 4 MG PO TABS: 4.0000 mg | ORAL_TABLET | Freq: Four times a day (QID) | ORAL | 0 refills | Status: DC

## 2022-04-17 MED ORDER — OXYCODONE-ACETAMINOPHEN 5-325 MG PO TABS
1.0000 | ORAL_TABLET | Freq: Once | ORAL | Status: AC
  Administered 2022-04-17: 1 via ORAL
  Filled 2022-04-17: qty 1

## 2022-04-17 MED ORDER — OXYCODONE-ACETAMINOPHEN 5-325 MG PO TABS: 1.0000 | ORAL_TABLET | Freq: Three times a day (TID) | ORAL | 0 refills | Status: AC | PRN

## 2022-04-17 NOTE — Progress Notes (Signed)
Orthopedic Tech Progress Note Patient Details:  Kamarius Buckbee April 05, 1956 [id number]  Ortho Devices Type of Ortho Device: Post (short leg) splint Ortho Device/Splint Location: rle Ortho Device/Splint Interventions: Ordered, Application, Adjustment   Post Interventions Patient Tolerated: Well Instructions Provided: Care of device, Adjustment of device  Pearl, [place] 04/17/2022, 5:44 AM

## 2022-04-17 NOTE — ED Notes (Signed)
Pt educated on care of limb. Pt educated on use of crutches. Pt verbalized understanding of education. Pt wheeled from ED. Pt transferred to car family to drive home

## 2022-04-17 NOTE — Discharge Instructions (Signed)
You have broken your fifth metatarsal, I placed you in a splint please wear at all times, do not get wet at this is not waterproof, please remain nonweightbearing, keep it elevated will not use apply ice to the area to help decrease pain and inflammation.  I have given you a short course of narcotics please take as prescribed.  This medication can make you drowsy do not consume alcohol or operate heavy machinery when taking this medication.  This medication is Tylenol in it do not take Tylenol and take this medication.   Please follow-up with orthopedics within the next week's time  Come back to the emergency department if you develop chest pain, shortness of breath, severe abdominal pain, uncontrolled nausea, vomiting, diarrhea.
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
## 2022-04-20 ENCOUNTER — Emergency Department (HOSPITAL_COMMUNITY)
Admission: EM | Admit: 2022-04-20 | Discharge: 2022-04-20 | Disposition: A | Discharge status: Home / Self Care | Payer: No Typology Code available for payment source | Attending: Emergency Medicine | Admitting: Emergency Medicine

## 2022-04-20 ENCOUNTER — Other Ambulatory Visit: Payer: Self-pay

## 2022-04-20 DIAGNOSIS — Z7984 Long term (current) use of oral hypoglycemic drugs: Secondary | ICD-10-CM | POA: Insufficient documentation

## 2022-04-20 DIAGNOSIS — I110 Hypertensive heart disease with heart failure: Secondary | ICD-10-CM | POA: Insufficient documentation

## 2022-04-20 DIAGNOSIS — I509 Heart failure, unspecified: Secondary | ICD-10-CM | POA: Insufficient documentation

## 2022-04-20 DIAGNOSIS — Z7982 Long term (current) use of aspirin: Secondary | ICD-10-CM | POA: Insufficient documentation

## 2022-04-20 DIAGNOSIS — Z79899 Other long term (current) drug therapy: Secondary | ICD-10-CM | POA: Insufficient documentation

## 2022-04-20 DIAGNOSIS — M79671 Pain in right foot: Secondary | ICD-10-CM | POA: Diagnosis present

## 2022-04-20 DIAGNOSIS — M5441 Lumbago with sciatica, right side: Secondary | ICD-10-CM | POA: Diagnosis not present

## 2022-04-20 DIAGNOSIS — G8929 Other chronic pain: Secondary | ICD-10-CM | POA: Diagnosis not present

## 2022-04-20 MED ORDER — KETOROLAC TROMETHAMINE 30 MG/ML IJ SOLN
30.0000 mg | Freq: Once | INTRAMUSCULAR | Status: AC
  Administered 2022-04-20: 30 mg via INTRAMUSCULAR
  Filled 2022-04-20: qty 1

## 2022-04-20 MED ORDER — METHOCARBAMOL 500 MG PO TABS: 500.0000 mg | ORAL_TABLET | Freq: Three times a day (TID) | ORAL | 0 refills | Status: AC | PRN

## 2022-04-20 MED ORDER — NAPROXEN 500 MG PO TABS: 500.0000 mg | ORAL_TABLET | Freq: Two times a day (BID) | ORAL | 0 refills | Status: AC

## 2022-04-20 NOTE — ED Triage Notes (Signed)
Patient coming to ED for evaluation of R foot pain and back pain.  Reports was recently dx with fx to foot.  States he has a hx of sciatica and using crutches has increased pain to back and R foot.  Reports he is unable to use crutches due to pain and has been having to bear weight on R foot.

## 2022-04-20 NOTE — Discharge Instructions (Addendum)
You were seen in the ED for your foot pain. I have prescribed an antiinflammatory and muscle relaxer to use in conjunction with your pain medication already prescribed. You need to avoid putting weight on the foot until you are evaluated by orthopedics. Please call the phone number you are provided to schedule this appointment with Dr. Alvan Dame.

## 2022-04-20 NOTE — ED Provider Notes (Signed)
[place] [facility] DEPT Provider Note   CSN: [id number] Arrival date & time: 04/20/22  0200     History History of hypertension, chronic back pain, OSA, CHF. Chief Complaint  Patient presents with   Foot Pain   Back Pain    Charles Riggs. is a 65 y.o. male. Patient presenting with continued right foot pain after fracturing his foot on Monday.  He says he has been using crutches, however has been having difficulty using these due to his chronic lower back pain and sciatica.  He says he has been putting weight on his right foot and his pain is worse than it was when this happened.  He says 8 out of 10 in intensity.  He has been taking the Percocet he was prescribed without any relief.  He has not followed up with orthopedics as he does not have insurance.  [facility] get into see his PCP until the start of December.    Foot Pain  Back Pain      Home Medications Prior to Admission medications   Medication Sig Start Date End Date Taking? Authorizing Provider  naproxen (NAPROSYN) 500 MG tablet Take 1 tablet (500 mg total) by mouth 2 (two) times daily for 7 days. 04/20/22 04/27/22 Yes Osiah Haring, Adora Fridge, PA-C  albuterol (PROVENTIL HFA;VENTOLIN HFA) 108 (90 Base) MCG/ACT inhaler Inhale 2 puffs into the lungs every 6 (six) hours as needed for wheezing or shortness of breath.    [provider]  albuterol (PROVENTIL) (2.5 MG/3ML) 0.083% nebulizer solution Take 3 mLs (2.5 mg total) by nebulization every 6 (six) hours as needed for wheezing or shortness of breath. 11/14/17   Noemi Chapel, MD  amLODipine (NORVASC) 10 MG tablet Take 10 mg by mouth daily.    [provider]  aspirin EC 81 MG tablet Take 1 tablet (81 mg total) by mouth daily. 03/13/19   [name], Manish J, MD  Atogepant (QULIPTA) 60 MG TABS Take 60 mg by mouth daily. 01/23/21   Pieter Partridge, DO  baclofen (LIORESAL) 10 MG tablet Take 10 mg by mouth at bedtime  as needed for muscle spasms. 02/23/21   [provider]  budesonide-formoterol (SYMBICORT) 160-4.5 MCG/ACT inhaler Inhale 2 puffs into the lungs 2 (two) times daily.    [provider]  celecoxib (CELEBREX) 100 MG capsule Take 100 mg by mouth as needed. 01/02/22   [provider]  Cholecalciferol (VITAMIN D3) 50 MCG (2000 UT) TABS Take 2,000 Units by mouth 2 (two) times daily.    [provider]  ferrous sulfate 325 (65 FE) MG tablet Take 325 mg by mouth daily with breakfast.    [provider]  fluticasone (FLONASE) 50 MCG/ACT nasal spray Place 2 sprays into both nostrils daily as needed for allergies or rhinitis.    [provider]  Fluticasone-Salmeterol (ADVAIR) 250-50 MCG/DOSE AEPB Inhale 1 puff into the lungs in the morning and at bedtime. 02/29/20   [provider]  furosemide (LASIX) 20 MG tablet Take 1 tablet (20 mg total) by mouth daily. 01/18/22   [name], Reynold Bowen, MD  guaiFENesin (MUCINEX) 600 MG 12 hr tablet Take 1,200 mg by mouth 2 (two) times daily.    [provider]  lidocaine (LIDODERM) 5 % Place 1 patch onto the skin daily. Remove & Discard patch within 12 hours or as directed by MD 03/17/22   [name], [name], PA-C  losartan (COZAAR) 50 MG tablet  Take 100 mg by mouth daily. 12/08/19   [provider]  metFORMIN (GLUCOPHAGE) 500 MG tablet Take 500 mg by mouth 2 (two) times daily with a meal. 03/26/22   [provider]  methocarbamol (ROBAXIN) 500 MG tablet Take 1 tablet (500 mg total) by mouth every 8 (eight) hours as needed for up to 10 days for muscle spasms. 04/20/22 04/30/22  Betti Goodenow, Adora Fridge, PA-C  metoprolol tartrate (LOPRESSOR) 50 MG tablet Take 50 mg by mouth 2 (two) times daily.    [provider]  montelukast (SINGULAIR) 10 MG tablet Take 10 mg by mouth daily.    [provider]  omeprazole (PRILOSEC) 40 MG capsule Take 40 mg by mouth daily.    [provider]  ondansetron (ZOFRAN) 4 MG tablet Take 1 tablet (4 mg total) by mouth every 6 (six) hours. 04/17/22   Marcello Fennel, PA-C  oxyCODONE-acetaminophen (PERCOCET/ROXICET) 5-325 MG tablet Take 1 tablet by mouth every 8 (eight) hours as needed for up to 4 days for severe pain. 04/17/22 04/21/22  Marcello Fennel, PA-C  polycarbophil (FIBERCON) 625 MG tablet Take 625 mg by mouth in the morning and at bedtime.    [provider]  rosuvastatin (CRESTOR) 20 MG tablet Take 10 mg by mouth at bedtime.    [provider]  sertraline (ZOLOFT) 100 MG tablet Take 100 mg by mouth daily. 07/29/19   [provider]  SUMAtriptan (IMITREX) 50 MG tablet Take 50 mg by mouth every 2 (two) hours as needed. 01/02/22   [provider]  terbinafine (LAMISIL) 1 % cream Apply 1 Application topically daily. 01/02/22   [provider]  Ubrogepant (UBRELVY) 100 MG TABS Take 1 tablet by mouth as needed (May repeat in 2 hours if needed.  Maximum 2 tablets in 24 hours.). 08/08/21   Pieter Partridge, DO      Allergies    Patient has no known allergies.    Review of Systems   Review of Systems  Musculoskeletal:  Positive for arthralgias and back pain.  All other systems reviewed and are negative.   Physical Exam Updated Vital Signs BP (!) 151/81 (BP Location: Left Arm)   Pulse 80   Temp 97.9 F (36.6 C) (Oral)   Resp 18   SpO2 93%  Physical Exam Vitals and nursing note reviewed.  Constitutional:      General: He is not in acute distress.    Appearance: Normal appearance. He is well-developed. He is not ill-appearing, toxic-appearing or diaphoretic.  HENT:     Head: Normocephalic and atraumatic.     Nose: No nasal deformity.     Mouth/Throat:     Lips: Pink. No lesions.  Eyes:     General: Gaze aligned appropriately. No scleral icterus.       Right eye: No discharge.        Left eye: No discharge.     Conjunctiva/sclera: Conjunctivae normal.     Right eye: Right  conjunctiva is not injected. No exudate or hemorrhage.    Left eye: Left conjunctiva is not injected. No exudate or hemorrhage. Pulmonary:     Effort: Pulmonary effort is normal. No respiratory distress.  Musculoskeletal:     Comments: There is a splint in place of the right lower extremity.  He has good sensation, normal capillary refill, and warm toes to the touch.  Midline lumbar spinal tenderness or step-offs noted.  Reproducible left-sided paraspinal muscular tenderness.  Normal strength and  sensation in lower extremities.  Skin:    General: Skin is warm and dry.  Neurological:     Mental Status: He is alert and oriented to person, place, and time.  Psychiatric:        Mood and Affect: Mood normal.        Speech: Speech normal.        Behavior: Behavior normal. Behavior is cooperative.     ED Results / Procedures / Treatments   Labs (all labs ordered are listed, but only abnormal results are displayed) Labs Reviewed - No data to display  EKG None  Radiology No results found.  Procedures Procedures   Medications Ordered in ED Medications  ketorolac (TORADOL) 30 MG/ML injection 30 mg (30 mg Intramuscular Given 04/20/22 0400)    ED Course/ Medical Decision Making/ A&P                           Medical Decision Making Risk Prescription drug management.   Patient is here with worsening pain after recent foot fracture.  Reviewed past x-rays and he had a nondisplaced right proximal fifth metatarsal fracture.  He has not been nonweightbearing as he was prescribed which is likely why his pain is worsening. He has no neurovascular compromise on exam. Will provide pain  management here and add on NSAID and muscle relaxer to pain regimen. Urged importance of orthopedic follow up and non weightbearing.   Final Clinical Impression(s) / ED Diagnoses Final diagnoses:  Right foot pain  Chronic low back pain with sciatica, sciatica laterality unspecified, unspecified back pain  laterality    Rx / DC Orders ED Discharge Orders          Ordered    naproxen (NAPROSYN) 500 MG tablet  2 times daily        04/20/22 0402    methocarbamol (ROBAXIN) 500 MG tablet  Every 8 hours PRN        04/20/22 0402              Adolphus Birchwood, PA-C 04/20/22 0412    Palumbo, April, MD 04/20/22 [phone]

## 2022-04-25 ENCOUNTER — Encounter (HOSPITAL_COMMUNITY): Payer: Self-pay

## 2022-04-25 ENCOUNTER — Emergency Department (HOSPITAL_COMMUNITY)
Admission: EM | Admit: 2022-04-25 | Discharge: 2022-04-26 | Disposition: A | Discharge status: Home / Self Care | Payer: No Typology Code available for payment source | Attending: Emergency Medicine | Admitting: Emergency Medicine

## 2022-04-25 ENCOUNTER — Other Ambulatory Visit: Payer: Self-pay

## 2022-04-25 DIAGNOSIS — Z952 Presence of prosthetic heart valve: Secondary | ICD-10-CM | POA: Diagnosis not present

## 2022-04-25 DIAGNOSIS — Z79899 Other long term (current) drug therapy: Secondary | ICD-10-CM | POA: Diagnosis not present

## 2022-04-25 DIAGNOSIS — I130 Hypertensive heart and chronic kidney disease with heart failure and stage 1 through stage 4 chronic kidney disease, or unspecified chronic kidney disease: Secondary | ICD-10-CM | POA: Insufficient documentation

## 2022-04-25 DIAGNOSIS — R600 Localized edema: Secondary | ICD-10-CM | POA: Insufficient documentation

## 2022-04-25 DIAGNOSIS — Z7984 Long term (current) use of oral hypoglycemic drugs: Secondary | ICD-10-CM | POA: Insufficient documentation

## 2022-04-25 DIAGNOSIS — J45909 Unspecified asthma, uncomplicated: Secondary | ICD-10-CM | POA: Insufficient documentation

## 2022-04-25 DIAGNOSIS — N189 Chronic kidney disease, unspecified: Secondary | ICD-10-CM | POA: Diagnosis not present

## 2022-04-25 DIAGNOSIS — Z7982 Long term (current) use of aspirin: Secondary | ICD-10-CM | POA: Diagnosis not present

## 2022-04-25 DIAGNOSIS — Z7951 Long term (current) use of inhaled steroids: Secondary | ICD-10-CM | POA: Insufficient documentation

## 2022-04-25 DIAGNOSIS — I5033 Acute on chronic diastolic (congestive) heart failure: Secondary | ICD-10-CM | POA: Insufficient documentation

## 2022-04-25 DIAGNOSIS — M7989 Other specified soft tissue disorders: Secondary | ICD-10-CM | POA: Diagnosis present

## 2022-04-25 DIAGNOSIS — R609 Edema, unspecified: Secondary | ICD-10-CM

## 2022-04-25 LAB — CBC WITH DIFFERENTIAL/PLATELET
Abs Immature Granulocytes: 0.03 10*3/uL (ref 0.00–0.07)
Basophils Absolute: 0 10*3/uL (ref 0.0–0.1)
Basophils Relative: 0 %
Eosinophils Absolute: 0.2 10*3/uL (ref 0.0–0.5)
Eosinophils Relative: 5 %
HCT: 37.4 % — ABNORMAL LOW (ref 39.0–52.0)
Hemoglobin: 11.9 g/dL — ABNORMAL LOW (ref 13.0–17.0)
Immature Granulocytes: 1 %
Lymphocytes Relative: 26 %
Lymphs Abs: 1.4 10*3/uL (ref 0.7–4.0)
MCH: 27.8 pg (ref 26.0–34.0)
MCHC: 31.8 g/dL (ref 30.0–36.0)
MCV: 87.4 fL (ref 80.0–100.0)
Monocytes Absolute: 0.7 10*3/uL (ref 0.1–1.0)
Monocytes Relative: 14 %
Neutro Abs: 2.8 10*3/uL (ref 1.7–7.7)
Neutrophils Relative %: 54 %
Platelets: 225 10*3/uL (ref 150–400)
RBC: 4.28 MIL/uL (ref 4.22–5.81)
RDW: 13.9 % (ref 11.5–15.5)
WBC: 5.2 10*3/uL (ref 4.0–10.5)
nRBC: 0 % (ref 0.0–0.2)

## 2022-04-25 MED ORDER — HYDROCODONE-ACETAMINOPHEN 5-325 MG PO TABS
1.0000 | ORAL_TABLET | Freq: Once | ORAL | Status: AC
  Administered 2022-04-25: 1 via ORAL
  Filled 2022-04-25: qty 1

## 2022-04-25 NOTE — ED Provider Triage Note (Signed)
Emergency Medicine Provider Triage Evaluation Note  Charles Riggs. , a 66 y.o. male  was evaluated in triage.  Pt complains of bilateral lower extremity swelling.  Patient had recent right foot fracture on 04/20/2022.  He states he has been more sedentary since injury sitting in chair/recliner most of the time.  Reports swelling occurring over the past 2 days.  Has left ankle monitor on her which is causing him pain due to swelling.  Denies history of swelling similarly.  Denies chest pain, shortness of breath, fever, chills, night sweats, known injury to either leg..  Denies history of DVT  Review of Systems  Positive: See above Negative:   Physical Exam  BP (!) 156/86 (BP Location: Left Arm)   Pulse 81   Temp 98.8 F (37.1 C) (Oral)   Resp 18   Ht 5\' 11"  (1.803 m)   Wt 123.4 kg   SpO2 95%   BMI 37.94 kg/m  Gen:   Awake, no distress   Resp:  Normal effort  MSK:   Moves extremities without difficulty  Other:  2-3+ pitting edema bilateral lower extremities.  Medical Decision Making  Medically screening exam initiated at 10:02 PM.  Appropriate orders placed.  Charles Riggs. was informed that the remainder of the evaluation will be completed by another provider, this initial triage assessment does not replace that evaluation, and the importance of remaining in the ED until their evaluation is complete.     Wilnette Kales, [place] 04/25/22 2203

## 2022-04-25 NOTE — ED Triage Notes (Signed)
Pt reports with right foot pain after breaking it last week. Pt's left foot and ankle are swelling now, pt has an ankle monitor on that is getting tighter from the swelling since last night.

## 2022-04-25 NOTE — ED Provider Notes (Signed)
[facility] DEPT Provider Note   CSN: [id number] Arrival date & time: 04/25/22  2137     History {Add pertinent medical, surgical, social history, OB history to HPI:1} Chief Complaint  Patient presents with   Foot Swelling    Charles Riggs. is a 66 y.o. male.  HPI     This is a 66 year old male with a history of chronic kidney disease, diastolic heart failure, hypertension who presents with leg swelling.  Patient recently had an injury to the right foot.  He has been in a splint.  He reports that he has mostly been staying off of his feet.  He has noted swelling to the bilateral feet and ankles.  He is wearing a ankle brace at this time on the left ankle which she states that at times gets very tight secondary to swelling.  He has previously been on Lasix but states that he is not currently on Lasix.  He does not keep up with his daily weights.  Denies chest pain or shortness of breath.  Home Medications Prior to Admission medications   Medication Sig Start Date End Date Taking? Authorizing Provider  albuterol (PROVENTIL HFA;VENTOLIN HFA) 108 (90 Base) MCG/ACT inhaler Inhale 2 puffs into the lungs every 6 (six) hours as needed for wheezing or shortness of breath.    [provider]  albuterol (PROVENTIL) (2.5 MG/3ML) 0.083% nebulizer solution Take 3 mLs (2.5 mg total) by nebulization every 6 (six) hours as needed for wheezing or shortness of breath. 11/14/17   Noemi Chapel, MD  amLODipine (NORVASC) 10 MG tablet Take 10 mg by mouth daily.    [provider]  aspirin EC 81 MG tablet Take 1 tablet (81 mg total) by mouth daily. 03/13/19   [name], Manish J, MD  Atogepant (QULIPTA) 60 MG TABS Take 60 mg by mouth daily. 01/23/21   Pieter Partridge, DO  baclofen (LIORESAL) 10 MG tablet Take 10 mg by mouth at bedtime as needed for muscle spasms. 02/23/21   [provider]  budesonide-formoterol (SYMBICORT) 160-4.5 MCG/ACT inhaler  Inhale 2 puffs into the lungs 2 (two) times daily.    [provider]  celecoxib (CELEBREX) 100 MG capsule Take 100 mg by mouth as needed. 01/02/22   [provider]  Cholecalciferol (VITAMIN D3) 50 MCG (2000 UT) TABS Take 2,000 Units by mouth 2 (two) times daily.    [provider]  ferrous sulfate 325 (65 FE) MG tablet Take 325 mg by mouth daily with breakfast.    [provider]  fluticasone (FLONASE) 50 MCG/ACT nasal spray Place 2 sprays into both nostrils daily as needed for allergies or rhinitis.    [provider]  Fluticasone-Salmeterol (ADVAIR) 250-50 MCG/DOSE AEPB Inhale 1 puff into the lungs in the morning and at bedtime. 02/29/20   [provider]  furosemide (LASIX) 20 MG tablet Take 1 tablet (20 mg total) by mouth daily. 01/18/22   [name], Reynold Bowen, MD  guaiFENesin (MUCINEX) 600 MG 12 hr tablet Take 1,200 mg by mouth 2 (two) times daily.    [provider]  lidocaine (LIDODERM) 5 % Place 1 patch onto the skin daily. Remove & Discard patch within 12 hours or as directed by MD 03/17/22   [name], [name], PA-C  losartan (COZAAR) 50 MG tablet Take 100 mg by mouth daily. 12/08/19   [provider]  metFORMIN (GLUCOPHAGE) 500 MG tablet Take 500 mg by mouth 2 (two) times daily with  a meal. 03/26/22   [provider]  methocarbamol (ROBAXIN) 500 MG tablet Take 1 tablet (500 mg total) by mouth every 8 (eight) hours as needed for up to 10 days for muscle spasms. 04/20/22 04/30/22  Loeffler, Adora Fridge, PA-C  metoprolol tartrate (LOPRESSOR) 50 MG tablet Take 50 mg by mouth 2 (two) times daily.    [provider]  montelukast (SINGULAIR) 10 MG tablet Take 10 mg by mouth daily.    [provider]  naproxen (NAPROSYN) 500 MG tablet Take 1 tablet (500 mg total) by mouth 2 (two) times daily for 7 days. 04/20/22 04/27/22  Loeffler, Adora Fridge, PA-C  omeprazole (PRILOSEC) 40 MG capsule Take 40 mg by mouth daily.     [provider]  ondansetron (ZOFRAN) 4 MG tablet Take 1 tablet (4 mg total) by mouth every 6 (six) hours. 04/17/22   Marcello Fennel, PA-C  polycarbophil (FIBERCON) 625 MG tablet Take 625 mg by mouth in the morning and at bedtime.    [provider]  rosuvastatin (CRESTOR) 20 MG tablet Take 10 mg by mouth at bedtime.    [provider]  sertraline (ZOLOFT) 100 MG tablet Take 100 mg by mouth daily. 07/29/19   [provider]  SUMAtriptan (IMITREX) 50 MG tablet Take 50 mg by mouth every 2 (two) hours as needed. 01/02/22   [provider]  terbinafine (LAMISIL) 1 % cream Apply 1 Application topically daily. 01/02/22   [provider]  Ubrogepant (UBRELVY) 100 MG TABS Take 1 tablet by mouth as needed (May repeat in 2 hours if needed.  Maximum 2 tablets in 24 hours.). 08/08/21   Pieter Partridge, DO      Allergies    Patient has no known allergies.    Review of Systems   Review of Systems  Constitutional:  Negative for fever.  Respiratory:  Negative for shortness of breath.   Cardiovascular:  Positive for leg swelling. Negative for chest pain.  All other systems reviewed and are negative.   Physical Exam Updated Vital Signs BP (!) 156/86 (BP Location: Left Arm)   Pulse 81   Temp 98.8 F (37.1 C) (Oral)   Resp 18   Ht 1.803 m (5\' 11" )   Wt 123.4 kg   SpO2 95%   BMI 37.94 kg/m  Physical Exam Vitals and nursing note reviewed.  Constitutional:      Appearance: He is well-developed. He is obese. He is not ill-appearing.  HENT:     Head: Normocephalic and atraumatic.  Eyes:     Pupils: Pupils are equal, round, and reactive to light.  Cardiovascular:     Rate and Rhythm: Normal rate and regular rhythm.     Heart sounds: Normal heart sounds.  Pulmonary:     Effort: Pulmonary effort is normal. No respiratory distress.     Breath sounds: Normal breath sounds. No wheezing.  Abdominal:     Palpations: Abdomen is soft.     Tenderness:  There is no abdominal tenderness.  Musculoskeletal:     Cervical back: Neck supple.     Comments: Patient with 1+ pitting edema to the midcalf bilaterally, ankle brace on the left with 1 full finger breath of space, left foot well perfused, no evidence of circulatory compromise.  Lymphadenopathy:     Cervical: No cervical adenopathy.  Skin:    General: Skin is warm and dry.  Neurological:     Mental Status: He is alert and oriented to person,  place, and time.  Psychiatric:        Mood and Affect: Mood normal.     ED Results / Procedures / Treatments   Labs (all labs ordered are listed, but only abnormal results are displayed) Labs Reviewed  CBC WITH DIFFERENTIAL/PLATELET  BASIC METABOLIC PANEL    EKG None  Radiology No results found.  Procedures Procedures  {Document cardiac monitor, telemetry assessment procedure when appropriate:1}  Medications Ordered in ED Medications - No data to display  ED Course/ Medical Decision Making/ A&P                           Medical Decision Making Amount and/or Complexity of Data Reviewed Labs: ordered.   ***  {Document critical care time when appropriate:1} {Document review of labs and clinical decision tools ie heart score, Chads2Vasc2 etc:1}  {Document your independent review of radiology images, and any outside records:1} {Document your discussion with family members, caretakers, and with consultants:1} {Document social determinants of health affecting pt's care:1} {Document your decision making why or why not admission, treatments were needed:1} Final Clinical Impression(s) / ED Diagnoses Final diagnoses:  None    Rx / DC Orders ED Discharge Orders     None

## 2022-04-26 LAB — BASIC METABOLIC PANEL
Anion gap: 5 (ref 5–15)
BUN: 16 mg/dL (ref 8–23)
CO2: 29 mmol/L (ref 22–32)
Calcium: 8.6 mg/dL — ABNORMAL LOW (ref 8.9–10.3)
Chloride: 106 mmol/L (ref 98–111)
Creatinine, Ser: 1.4 mg/dL — ABNORMAL HIGH (ref 0.61–1.24)
GFR, Estimated: 56 mL/min — ABNORMAL LOW (ref >60)
Glucose, Bld: 94 mg/dL (ref 70–99)
Potassium: 4.4 mmol/L (ref 3.5–5.1)
Sodium: 140 mmol/L (ref 135–145)

## 2022-04-26 MED ORDER — FUROSEMIDE 10 MG/ML IJ SOLN
40.0000 mg | Freq: Once | INTRAMUSCULAR | Status: AC
  Administered 2022-04-26: 40 mg via INTRAVENOUS
  Filled 2022-04-26: qty 4

## 2022-04-26 MED ORDER — FUROSEMIDE 20 MG PO TABS: 40.0000 mg | ORAL_TABLET | Freq: Every day | ORAL | 0 refills | Status: DC

## 2022-04-26 NOTE — Discharge Instructions (Signed)
You were seen today for leg swelling.  This is likely related to fluid retention.  Take 40 mg of Lasix for the next 3 days.  Restart 20 mg daily as previously prescribed after that.  Make sure to keep your legs elevated.  Regarding your foot fracture.  Follow-up with orthopedics as instructed.  Keep the postop shoe in place and stay nonweightbearing until instructed otherwise.

## 2022-05-02 ENCOUNTER — Ambulatory Visit: Payer: No Typology Code available for payment source | Admitting: Orthopaedic Surgery

## 2022-05-10 ENCOUNTER — Ambulatory Visit: Payer: No Typology Code available for payment source | Admitting: Neurology

## 2022-05-10 NOTE — Progress Notes (Unsigned)
Renewal Referral faxed to the [place] [facility] [phone]

## 2022-05-18 ENCOUNTER — Ambulatory Visit: Payer: No Typology Code available for payment source | Admitting: Neurology

## 2022-05-23 NOTE — Progress Notes (Unsigned)
NEUROLOGY FOLLOW UP OFFICE NOTE  Charles Riggs [id number]  Assessment/Plan:   Migraine without aura, without status migrainosus, not intratable - improved 2.  OSA      1.         Migraine prevention:  Qulipta 60mg  daily.  Will restart topiramate at 25mg  at bedtime for possible synergistic effect to further reduced migraine frequency. 2.         Migraine rescue:  Ubrelvy 100mg  with naproxen 500mg  (or Tylenol) 3.         Limit use of pain relievers to no more than 2 days out of week to prevent risk of rebound or medication-overuse headache. 4.         Keep headache diary 5.         Sleep hygiene - follow up with sleep medicine 6.         Follow up 4-5 months.   Subjective:  Charles Riggs. Is a 66 year old left-handed male with CHF, CKD, HTN, and OSA who follows up for migraines.   UPDATE: Intensity:  severe Duration:  usually 30 minutes later starts feeling relief Frequency:  5-8 days a month.  It has increased this month (18-19 days) due to having a cold with excessive coughing that triggered headaches.      Rescue protocol:  Takes 2 Tylenols with the [place] as [name] alone not as effective. Current NSAIDS:  ASA 81mg  daily Current analgesics:  Acetaminophen 500mg  Current triptans:  Sumatriptan 100mg  Current ergotamine:  none Current anti-emetic:  none Current muscle relaxants:  Robaxin Current anti-anxiolytic:  none Current sleep aide:  none Current Antihypertensive medications:  Amlodipine, carvedilol Current Antidepressant medications:  sertraline 100mg  daily Current Anticonvulsant medications:  none Current anti-CGRP:  Ubrelvy 100mg , Qulipta 60mg  daily Current Vitamins/Herbal/Supplements:  Ferrous sulfate Current Antihistamines/Decongestants:  none Other therapy:  none Hormone/birth control:  none   Caffeine:  Rarely drinks coffee or soda with caffeine Diet:  Drinks 24 oz water daily, otherwise juice. Exercise:  yes Depression:  yes; Anxiety:   yes Other pain:  Low back pain (lumbar stenosis), bilateral hip pain Sleep hygiene:  Poor even with CPAP.   HISTORY:  He has had headaches for several years.  He reports a moderate non-throbbing frontal pain.  They are associated with photophobia, phonophobia, nausea, blurred vision and eyes hurt.  No associated vomiting or weakness. It often lasts all day and occurs 3 to 4 times a week.  He treats with Tylenol 500mg  with mild efficacy.  He has chronic hip, back and leg pain (arthritis, lumbar stenosis, plantar fasciitis) which may trigger the headache.     Headaches later became throbbing and bitemporal     Past NSAIDS:  meloxicam Past analgesics:  oxycodone Past abortive triptans:  Has tried a triptan Past abortive ergotamine:  none Past muscle relaxants:  methocarbamol Past anti-emetic:  Ondansetron 4mg  Past antihypertensive medications:  metoprolol, furosemide Past antidepressant medications:  amitriptyline Past anticonvulsant medications:  Gabapentin 400mg  TID, topiramate, maybe Depakote Past anti-CGRP:  Aimovig 70mg  (fear of needles) Past vitamins/Herbal/Supplements:  none Past antihistamines/decongestants:  Diphenhydramine, hydroxyzine Other past therapies:  occipital nerve block (effective)     Family history of headache:  no   MRI of brain without contrast on 12/17/2002 reportedly showed "no brain abnormality".  CT Head from 11/22/2007 and 07/17/2016 were personally reviewed and were unremarkable.  PAST MEDICAL HISTORY: Past Medical History:  Diagnosis Date   Acute on chronic diastolic heart failure ([place])  Anemia in chronic kidney disease    Anxiety    Aortic insufficiency    Arthritis    Asthma    CHF (congestive heart failure) (HCC)    CKD (chronic kidney disease)    Dyspnea    Essential hypertension    Nerve pain    Non-rheumatic aortic regurgitation    Obstructive sleep apnea    cpap   Quadricuspid aortic valve    S/P minimally invasive aortic valve  replacement with bioprosthetic valve 12/16/2018   25 mm Edwards Inspiris Resilia stented bovine pericardial tissue valve via right mini thoracotomy approach   Severe aortic insufficiency    SOB (shortness of breath)    Syncope 12/2018    MEDICATIONS: Current Outpatient Medications on File Prior to Visit  Medication Sig Dispense Refill   albuterol (PROVENTIL HFA;VENTOLIN HFA) 108 (90 Base) MCG/ACT inhaler Inhale 2 puffs into the lungs every 6 (six) hours as needed for wheezing or shortness of breath.     albuterol (PROVENTIL) (2.5 MG/3ML) 0.083% nebulizer solution Take 3 mLs (2.5 mg total) by nebulization every 6 (six) hours as needed for wheezing or shortness of breath. 75 mL 12   amLODipine (NORVASC) 10 MG tablet Take 10 mg by mouth daily.     aspirin EC 81 MG tablet Take 1 tablet (81 mg total) by mouth daily. 90 tablet 1   Atogepant (QULIPTA) 60 MG TABS Take 60 mg by mouth daily. 30 tablet 5   baclofen (LIORESAL) 10 MG tablet Take 10 mg by mouth at bedtime as needed for muscle spasms.     budesonide-formoterol (SYMBICORT) 160-4.5 MCG/ACT inhaler Inhale 2 puffs into the lungs 2 (two) times daily.     celecoxib (CELEBREX) 100 MG capsule Take 100 mg by mouth as needed.     Cholecalciferol (VITAMIN D3) 50 MCG (2000 UT) TABS Take 2,000 Units by mouth 2 (two) times daily.     ferrous sulfate 325 (65 FE) MG tablet Take 325 mg by mouth daily with breakfast.     fluticasone (FLONASE) 50 MCG/ACT nasal spray Place 2 sprays into both nostrils daily as needed for allergies or rhinitis.     Fluticasone-Salmeterol (ADVAIR) 250-50 MCG/DOSE AEPB Inhale 1 puff into the lungs in the morning and at bedtime.     furosemide (LASIX) 20 MG tablet Take 1 tablet (20 mg total) by mouth daily. 90 tablet 3   furosemide (LASIX) 20 MG tablet Take 2 tablets (40 mg total) by mouth daily. 3 tablet 0   guaiFENesin (MUCINEX) 600 MG 12 hr tablet Take 1,200 mg by mouth 2 (two) times daily.     lidocaine (LIDODERM) 5 % Place 1  patch onto the skin daily. Remove & Discard patch within 12 hours or as directed by MD 30 patch 0   losartan (COZAAR) 50 MG tablet Take 100 mg by mouth daily.     metFORMIN (GLUCOPHAGE) 500 MG tablet Take 500 mg by mouth 2 (two) times daily with a meal.     metoprolol tartrate (LOPRESSOR) 50 MG tablet Take 50 mg by mouth 2 (two) times daily.     montelukast (SINGULAIR) 10 MG tablet Take 10 mg by mouth daily.     omeprazole (PRILOSEC) 40 MG capsule Take 40 mg by mouth daily.     ondansetron (ZOFRAN) 4 MG tablet Take 1 tablet (4 mg total) by mouth every 6 (six) hours. 12 tablet 0   polycarbophil (FIBERCON) 625 MG tablet Take 625 mg by mouth in the morning and  at bedtime.     rosuvastatin (CRESTOR) 20 MG tablet Take 10 mg by mouth at bedtime.     sertraline (ZOLOFT) 100 MG tablet Take 100 mg by mouth daily.     SUMAtriptan (IMITREX) 50 MG tablet Take 50 mg by mouth every 2 (two) hours as needed.     terbinafine (LAMISIL) 1 % cream Apply 1 Application topically daily.     Ubrogepant (UBRELVY) 100 MG TABS Take 1 tablet by mouth as needed (May repeat in 2 hours if needed.  Maximum 2 tablets in 24 hours.). 10 tablet 11   No current facility-administered medications on file prior to visit.    ALLERGIES: No Known Allergies  FAMILY HISTORY: Family History  Problem Relation Age of Onset   COPD Father    Cancer Brother       Objective:  Blood pressure 133/86, pulse 74, height 5\' 11"  (1.803 m), weight 273 lb 12.8 oz (124.2 kg), SpO2 97 %. General: No acute distress.  Patient appears well-groomed.   Head:  Normocephalic/atraumatic Eyes:  Fundi examined but not visualized Neck: supple, no paraspinal tenderness, full range of motion Heart:  Regular rate and rhythm Lungs:  Clear to auscultation bilaterally Back: No paraspinal tenderness Neurological Exam: alert and oriented to person, place, and time.  Speech fluent and not dysarthric, language intact.  CN II-XII intact. Bulk and tone normal,  muscle strength 5/5 throughout.  Sensation to light touch intact.  Deep tendon reflexes 2+ throughout, toes downgoing.  Finger to nose testing intact.  Gait normal, Romberg negative.   Metta Clines, DO

## 2022-05-24 ENCOUNTER — Ambulatory Visit (INDEPENDENT_AMBULATORY_CARE_PROVIDER_SITE_OTHER): Payer: No Typology Code available for payment source | Admitting: Neurology

## 2022-05-24 ENCOUNTER — Encounter: Payer: Self-pay | Admitting: Neurology

## 2022-05-24 VITALS — BP 133/86 | HR 74 | Ht 71.0 in | Wt 273.8 lb

## 2022-05-24 DIAGNOSIS — G43009 Migraine without aura, not intractable, without status migrainosus: Secondary | ICD-10-CM

## 2022-05-24 MED ORDER — TOPIRAMATE 25 MG PO TABS: 25.0000 mg | ORAL_TABLET | Freq: Every day | ORAL | 5 refills | Status: DC

## 2022-05-24 MED ORDER — QULIPTA 60 MG PO TABS: 60.0000 mg | ORAL_TABLET | Freq: Every day | ORAL | 5 refills | Status: DC

## 2022-05-24 MED ORDER — UBRELVY 100 MG PO TABS: 1.0000 | ORAL_TABLET | ORAL | 5 refills | Status: DC | PRN

## 2022-05-24 NOTE — Patient Instructions (Signed)
Start topiramate 25mg  at bedtime.  If no improvement in 6 weeks, contact me Continue Qulipta 60mg  daily Continue Ubrelvy as directed for acute headache attacks Limit use of pain relievers to no more than 2 days out of week to prevent risk of rebound or medication-overuse headache. Keep headache diary Follow up 4-5 months.

## 2022-05-29 ENCOUNTER — Encounter: Payer: Self-pay | Admitting: Orthopedic Surgery

## 2022-05-29 ENCOUNTER — Ambulatory Visit (INDEPENDENT_AMBULATORY_CARE_PROVIDER_SITE_OTHER): Payer: No Typology Code available for payment source | Admitting: Orthopedic Surgery

## 2022-05-29 ENCOUNTER — Ambulatory Visit (INDEPENDENT_AMBULATORY_CARE_PROVIDER_SITE_OTHER): Payer: No Typology Code available for payment source

## 2022-05-29 DIAGNOSIS — M79671 Pain in right foot: Secondary | ICD-10-CM | POA: Diagnosis not present

## 2022-05-29 DIAGNOSIS — S99191A Other physeal fracture of right metatarsal, initial encounter for closed fracture: Secondary | ICD-10-CM | POA: Diagnosis not present
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
## 2022-06-19 ENCOUNTER — Ambulatory Visit (INDEPENDENT_AMBULATORY_CARE_PROVIDER_SITE_OTHER): Payer: No Typology Code available for payment source

## 2022-06-19 ENCOUNTER — Encounter: Payer: Self-pay | Admitting: Orthopedic Surgery

## 2022-06-19 ENCOUNTER — Ambulatory Visit (INDEPENDENT_AMBULATORY_CARE_PROVIDER_SITE_OTHER): Payer: No Typology Code available for payment source | Admitting: Orthopedic Surgery

## 2022-06-19 DIAGNOSIS — S99191A Other physeal fracture of right metatarsal, initial encounter for closed fracture: Secondary | ICD-10-CM

## 2022-06-19 NOTE — Progress Notes (Signed)
Office Visit Note   Patient: Charles Riggs.           Date of Birth: [birth year]           MRN: [id number] Visit Date: 06/19/2022              Requested by: [place] [facility] [address] [place],  [place] [address] PCP: [facility]  Chief Complaint  Patient presents with   Right Foot - Follow-up    Right 5th MT fx and jones fx base of the 5th MT       HPI: Patient is a 67 year old gentleman who presents status post Jones fracture base of the fifth metatarsal right foot.  Patient did not want to pursue surgery he was placed in a postoperative shoe and patient presents at this time full weightbearing in regular sneakers stating that he only has a little tenderness.  Assessment & Plan: Visit Diagnoses:  1. Closed fracture of base of fifth metatarsal bone of right foot at metaphyseal-diaphyseal junction, initial encounter     Plan: Continue with stiff soled shoes recommend vitamin D3 5000 international units a day follow-up in 4 weeks with repeat three-view radiographs of the right foot.  Follow-Up Instructions: Return in about 4 weeks (around 07/17/2022).   Ortho Exam  Patient is alert, oriented, no adenopathy, well-dressed, normal affect, normal respiratory effort. Examination patient has decreased pain and swelling in the right foot.  He is able to ambulate well.  Radiograph shows interval healing of the Jones fracture.  Imaging: XR Foot Complete Right  Result Date: 06/19/2022 Three-view radiographs of the right foot shows interval healing of the Jones fracture base of the fifth metatarsal.  No images are attached to the encounter.  Labs: Lab Results  Component Value Date   HGBA1C 5.5 12/11/2018   HGBA1C 6.3 10/05/2008   REPTSTATUS 01/09/2019 FINAL 01/04/2019   REPTSTATUS 01/04/2019 FINAL 01/04/2019   GRAMSTAIN  01/04/2019    RARE WBC PRESENT,BOTH PMN AND MONONUCLEAR NO ORGANISMS SEEN Performed at [facility], [place] [address]  [place], [place] [address]    CULT  01/04/2019    NO GROWTH 5 DAYS Performed at [place] [address] [place], [place] [address]      Lab Results  Component Value Date   ALBUMIN 3.6 03/05/2019   ALBUMIN 2.9 (L) 01/04/2019   ALBUMIN 2.9 (L) 12/29/2018    Lab Results  Component Value Date   MG 2.9 (H) 12/17/2018   MG 2.8 (H) 12/17/2018   MG 3.1 (H) 12/16/2018   No results found for: "VD25OH"  No results found for: "PREALBUMIN"    Latest Ref Rng & Units 04/25/2022   11:50 PM 05/25/2021    1:26 PM 03/10/2019    1:52 AM  CBC EXTENDED  WBC 4.0 - 10.5 K/uL 5.2  3.9  9.3   RBC 4.22 - 5.81 MIL/uL 4.28  4.88  3.75   Hemoglobin 13.0 - 17.0 g/dL 11.9  13.5  9.1   HCT 39.0 - 52.0 % 37.4  43.3  30.1   Platelets 150 - 400 K/uL 225  218  226   NEUT# 1.7 - 7.7 K/uL 2.8     Lymph# 0.7 - 4.0 K/uL 1.4        There is no height or weight on file to calculate BMI.  Orders:  Orders Placed This Encounter  Procedures   XR Foot Complete Right   No orders of the defined  types were placed in this encounter.    Procedures: No procedures performed  Clinical Data: No additional findings.  ROS:  All other systems negative, except as noted in the HPI. Review of Systems  Objective: Vital Signs: There were no vitals taken for this visit.  Specialty Comments:  No specialty comments available.  PMFS History: Patient Active Problem List   Diagnosis Date Noted   Acute pain of right shoulder 07/13/2020   Numbness 07/11/2020   Primary osteoarthritis of right hip 10/21/2019   Anemia 10/15/2019   Hyperlipidemia 10/15/2019   Status post total replacement of left hip 03/09/2019   Primary osteoarthritis of left hip 02/10/2019   Acute respiratory failure with hypoxemia (HCC) 01/04/2019   Pleural effusion 12/28/2018   Syncope 12/28/2018   Acute blood loss anemia    S/P AVR (aortic valve replacement) 12/16/2018   Anemia in chronic kidney disease    Chronic kidney disease  (CKD)    Coronary artery disease involving native coronary artery of native heart without angina pectoris 09/04/2018   Elevated troponin 08/29/2018   Severe aortic insufficiency    Acute on chronic diastolic heart failure ([place])    Nonrheumatic aortic valve insufficiency    Essential hypertension    Obstructive sleep apnea    Quadricuspid aortic valve    Chest pain in adult 08/27/2018   LEG PAIN 04/26/2010   DEPRESSION 08/19/2009   RENAL CALCULUS 01/05/2009   DM 10/12/2008   Personal history presenting hazards to health 06/16/2007   Asthma 03/24/2007   GERD 03/24/2007   Past Medical History:  Diagnosis Date   Acute on chronic diastolic heart failure (HCC)    Anemia in chronic kidney disease    Anxiety    Aortic insufficiency    Arthritis    Asthma    CHF (congestive heart failure) (HCC)    CKD (chronic kidney disease)    Dyspnea    Essential hypertension    Nerve pain    Non-rheumatic aortic regurgitation    Obstructive sleep apnea    cpap   Quadricuspid aortic valve    S/P minimally invasive aortic valve replacement with bioprosthetic valve 12/16/2018   25 mm Edwards Inspiris Resilia stented bovine pericardial tissue valve via right mini thoracotomy approach   Severe aortic insufficiency    SOB (shortness of breath)    Syncope 12/2018    Family History  Problem Relation Age of Onset   COPD Father    Cancer Brother     Past Surgical History:  Procedure Laterality Date   AORTIC VALVE REPLACEMENT N/A 12/16/2018   Procedure: MINIMALLY INVASIVE AORTIC VALVE REPLACEMENT (AVR) using Inspiris Aortic valve 45mm.;  Surgeon: Rexene Alberts, MD;  Location[facility];  Service: Open Heart Surgery;  Laterality: N/A;   BACK SURGERY     HERNIA REPAIR     IR THORACENTESIS ASP PLEURAL SPACE W/IMG GUIDE  12/29/2018   REVERSE SHOULDER ARTHROPLASTY Right 06/01/2021   Procedure: REVERSE SHOULDER ARTHROPLASTY;  Surgeon: Tania Ade, MD;  Location: [facility];  Service: Orthopedics;   Laterality: Right;   RIB PLATING  12/16/2018   Procedure: Rib Plating;  Surgeon: Rexene Alberts, MD;  Location: [place];  Service: Open Heart Surgery;;   RIGHT/LEFT HEART CATH AND CORONARY ANGIOGRAPHY N/A 10/14/2018   Procedure: RIGHT/LEFT HEART CATH AND CORONARY ANGIOGRAPHY;  Surgeon: Nigel Mormon, MD;  Location: [place] [facility] LAB;  Service: Cardiovascular;  Laterality: N/A;   TEE WITHOUT CARDIOVERSION N/A 11/11/2018   Procedure: TRANSESOPHAGEAL  ECHOCARDIOGRAM (TEE);  Surgeon: Nigel Mormon, MD;  Location: [facility] ENDOSCOPY;  Service: Cardiovascular;  Laterality: N/A;   TEE WITHOUT CARDIOVERSION N/A 12/16/2018   Procedure: TRANSESOPHAGEAL ECHOCARDIOGRAM (TEE);  Surgeon: Rexene Alberts, MD;  Location: [place];  Service: Open Heart Surgery;  Laterality: N/A;   TOTAL HIP ARTHROPLASTY Left 03/09/2019   Procedure: LEFT TOTAL HIP ARTHROPLASTY ANTERIOR APPROACH;  Surgeon: Leandrew Koyanagi, MD;  Location: [facility];  Service: Orthopedics;  Laterality: Left;   Social History   Occupational History   Not on file  Tobacco Use   Smoking status: Former    Packs/day: 1.00    Years: 7.00    Total pack years: 7.00    Types: Cigarettes    Quit date: 2011    Years since quitting: 13.0   Smokeless tobacco: Never   Tobacco comments:    30 PY  Vaping Use   Vaping Use: Never used  Substance and Sexual Activity   Alcohol use: No   Drug use: No   Sexual activity: Not on file

## 2022-06-25 DIAGNOSIS — G5621 Lesion of ulnar nerve, right upper limb: Secondary | ICD-10-CM | POA: Diagnosis not present

## 2022-06-25 DIAGNOSIS — R319 Hematuria, unspecified: Secondary | ICD-10-CM | POA: Diagnosis not present

## 2022-06-25 DIAGNOSIS — I440 Atrioventricular block, first degree: Secondary | ICD-10-CM | POA: Diagnosis not present

## 2022-07-17 ENCOUNTER — Ambulatory Visit (INDEPENDENT_AMBULATORY_CARE_PROVIDER_SITE_OTHER): Payer: No Typology Code available for payment source | Admitting: Orthopedic Surgery

## 2022-07-17 ENCOUNTER — Ambulatory Visit (INDEPENDENT_AMBULATORY_CARE_PROVIDER_SITE_OTHER): Payer: No Typology Code available for payment source

## 2022-07-17 DIAGNOSIS — S99191A Other physeal fracture of right metatarsal, initial encounter for closed fracture: Secondary | ICD-10-CM

## 2022-07-31 ENCOUNTER — Encounter: Payer: Self-pay | Admitting: Orthopedic Surgery

## 2022-07-31 NOTE — Progress Notes (Signed)
Office Visit Note   Patient: Charles Riggs.           Date of Birth: [birth year]           MRN: [id number] Visit Date: 07/17/2022              Requested by: [place] [address] [place],  [place] [id number] [facility]: [facility]  Chief Complaint  Patient presents with   Right Foot - Fracture    Fracture 5th MT and jones fracture      HPI: Patient is a 67 year old gentleman who presents in follow-up for Jones fracture base of the fifth metatarsal right foot.  Patient is currently ambulating in crocs.  Assessment & Plan: Visit Diagnoses:  1. Closed fracture of base of fifth metatarsal bone of right foot at metaphyseal-diaphyseal junction, initial encounter     Plan: Recommended a stiff soled shoe, vitamin D3 5000 units a day.  Patient is asymptomatic he will follow-up as needed.  Follow-Up Instructions: No follow-ups on file.   Ortho Exam  Patient is alert, oriented, no adenopathy, well-dressed, normal affect, normal respiratory effort. Examination the fracture site is nontender to palpation and he has no pain with weightbearing.  Radiograph shows stable healing.  Imaging: No results found. No images are attached to the encounter.  Labs: Lab Results  Component Value Date   HGBA1C 5.5 12/11/2018   HGBA1C 6.3 10/05/2008   REPTSTATUS 01/09/2019 FINAL 01/04/2019   REPTSTATUS 01/04/2019 FINAL 01/04/2019   GRAMSTAIN  01/04/2019    RARE WBC PRESENT,BOTH PMN AND MONONUCLEAR NO ORGANISMS SEEN Performed at [facility], [place] [address], [place], [place] [address]    CULT  01/04/2019    NO GROWTH 5 DAYS Performed at [facility] [place] [address] [place], [place] [address]      Lab Results  Component Value Date   ALBUMIN 3.6 03/05/2019   ALBUMIN 2.9 (L) 01/04/2019   ALBUMIN 2.9 (L) 12/29/2018    Lab Results  Component Value Date   MG 2.9 (H) 12/17/2018   MG 2.8 (H) 12/17/2018   MG 3.1 (H) 12/16/2018   No results found for:  "VD25OH"  No results found for: "PREALBUMIN"    Latest Ref Rng & Units 04/25/2022   11:50 PM 05/25/2021    1:26 PM 03/10/2019    1:52 AM  CBC EXTENDED  WBC 4.0 - 10.5 K/uL 5.2  3.9  9.3   RBC 4.22 - 5.81 MIL/uL 4.28  4.88  3.75   Hemoglobin 13.0 - 17.0 g/dL 11.9  13.5  9.1   HCT 39.0 - 52.0 % 37.4  43.3  30.1   Platelets 150 - 400 K/uL 225  218  226   NEUT# 1.7 - 7.7 K/uL 2.8     Lymph# 0.7 - 4.0 K/uL 1.4        There is no height or weight on file to calculate BMI.  Orders:  Orders Placed This Encounter  Procedures   XR Foot Complete Right   No orders of the defined types were placed in this encounter.    Procedures: No procedures performed  Clinical Data: No additional findings.  ROS:  All other systems negative, except as noted in the HPI. Review of Systems  Objective: Vital Signs: There were no vitals taken for this visit.  Specialty Comments:  No specialty comments available.  PMFS History: Patient Active Problem List   Diagnosis Date Noted   Acute pain of  right shoulder 07/13/2020   Numbness 07/11/2020   Primary osteoarthritis of right hip 10/21/2019   Anemia 10/15/2019   Hyperlipidemia 10/15/2019   Status post total replacement of left hip 03/09/2019   Primary osteoarthritis of left hip 02/10/2019   Acute respiratory failure with hypoxemia (HCC) 01/04/2019   Pleural effusion 12/28/2018   Syncope 12/28/2018   Acute blood loss anemia    S/P AVR (aortic valve replacement) 12/16/2018   Anemia in chronic kidney disease    Chronic kidney disease (CKD)    Coronary artery disease involving native coronary artery of native heart without angina pectoris 09/04/2018   Elevated troponin 08/29/2018   Severe aortic insufficiency    Acute on chronic diastolic heart failure ([place])    Nonrheumatic aortic valve insufficiency    Essential hypertension    Obstructive sleep apnea    Quadricuspid aortic valve    Chest pain in adult 08/27/2018   LEG PAIN  04/26/2010   DEPRESSION 08/19/2009   RENAL CALCULUS 01/05/2009   DM 10/12/2008   Personal history presenting hazards to health 06/16/2007   Asthma 03/24/2007   GERD 03/24/2007   Past Medical History:  Diagnosis Date   Acute on chronic diastolic heart failure (HCC)    Anemia in chronic kidney disease    Anxiety    Aortic insufficiency    Arthritis    Asthma    CHF (congestive heart failure) (HCC)    CKD (chronic kidney disease)    Dyspnea    Essential hypertension    Nerve pain    Non-rheumatic aortic regurgitation    Obstructive sleep apnea    cpap   Quadricuspid aortic valve    S/P minimally invasive aortic valve replacement with bioprosthetic valve 12/16/2018   25 mm Edwards Inspiris Resilia stented bovine pericardial tissue valve via right mini thoracotomy approach   Severe aortic insufficiency    SOB (shortness of breath)    Syncope 12/2018    Family History  Problem Relation Age of Onset   COPD Father    Cancer Brother     Past Surgical History:  Procedure Laterality Date   AORTIC VALVE REPLACEMENT N/A 12/16/2018   Procedure: MINIMALLY INVASIVE AORTIC VALVE REPLACEMENT (AVR) using Inspiris Aortic valve 29m.;  Surgeon: ORexene Alberts MD;  [facility]  Service: Open [facility] Surgery;  Laterality: N/A;   BACK SURGERY     HERNIA REPAIR     IR THORACENTESIS ASP PLEURAL SPACE W/IMG GUIDE  12/29/2018   REVERSE SHOULDER ARTHROPLASTY Right 06/01/2021   Procedure: REVERSE SHOULDER ARTHROPLASTY;  Surgeon: CTania Ade MD;  [facility];  Service: Orthopedics;  Laterality: Right;   RIB PLATING  12/16/2018   Procedure: Rib Plating;  Surgeon: ORexene Alberts MD;  [place] [facility]  Service: Open Heart Surgery;;   RIGHT/LEFT HEART CATH AND CORONARY ANGIOGRAPHY N/A 10/14/2018   Procedure: RIGHT/LEFT HEART CATH AND CORONARY ANGIOGRAPHY;  Surgeon: PNigel Mormon MD;  [facility];  Service: Cardiovascular;  Laterality: N/A;   TEE WITHOUT  CARDIOVERSION N/A 11/11/2018   Procedure: TRANSESOPHAGEAL ECHOCARDIOGRAM (TEE);  Surgeon: PNigel Mormon MD;  [facility];  Service: Cardiovascular;  Laterality: N/A;   TEE WITHOUT CARDIOVERSION N/A 12/16/2018   Procedure: TRANSESOPHAGEAL ECHOCARDIOGRAM (TEE);  Surgeon: ORexene Alberts MD;  [place] [facility]  Service: Open Heart Surgery;  Laterality: N/A;   TOTAL HIP ARTHROPLASTY Left 03/09/2019   Procedure: LEFT TOTAL HIP ARTHROPLASTY ANTERIOR APPROACH;  Surgeon: XLeandrew Koyanagi MD;  Location: [place];  Service: Orthopedics;  Laterality: Left;   Social History   Occupational History   Not on file  Tobacco Use   Smoking status: Former    Packs/day: 1.00    Years: 7.00    Total pack years: 7.00    Types: Cigarettes    Quit date: 2011    Years since quitting: 13.1   Smokeless tobacco: Never   Tobacco comments:    30 PY  Vaping Use   Vaping Use: Never used  Substance and Sexual Activity   Alcohol use: No   Drug use: No   Sexual activity: Not on file

## 2022-08-08 ENCOUNTER — Telehealth: Payer: Self-pay

## 2022-08-08 NOTE — Telephone Encounter (Signed)
Agree. Sounds like URI or lung infection. Can make a non urgent f/u w/me in April if symptoms not resolved.   Thanks MJP

## 2022-08-08 NOTE — Telephone Encounter (Signed)
Treatment as per PCP. Currently scheduled to see me in 03/2023. Okay to keep unless any new cardiac issues come up before then.  Thanks MJP

## 2022-08-08 NOTE — Telephone Encounter (Signed)
Patient calling because he is experiencing some more labored breathing. When he takes a deep breath it makes him cough. He fills like he may have fluid on is lungs. This began about a week ago, but has gotten much worse since Monday. He says it's even causing him to struggle with sleeping, as the air hits his throat its causing him to cough severely. He says he has some wheezing (and I can hear it over the phone after he gets done coughing). Patient states it does feeling like he has needles in his esophagus when he's taking the deep breath and coughing (8 out of 10 during exacerbation). It is most noticeable when active, but occurring also when at rest. He had a fever of 101 on Monday evening, had nausea and diarrhea, and green mucous. I did recommend him contacting his PCP for the fever and GI upset.

## 2022-08-08 NOTE — Telephone Encounter (Signed)
He went to PCP at the [facility] earlier today after I told him to F/u with them and he has COVID.

## 2022-08-21 ENCOUNTER — Telehealth: Payer: Self-pay

## 2022-08-21 NOTE — Telephone Encounter (Signed)
Patient is having [facility] send over fax to fill out what kind of valve was used in his valve replacement. Patient is having a MRI on 08/23/22

## 2022-08-21 NOTE — Telephone Encounter (Signed)
Minimally Invasive Aortic Valve Replacement (11/2018): Right Anterior Mini-thoracotomy Edwards Inspiris Resilia Stented Bovine Pericardial Tissue Valve (size 54mm, model #11500A, serial [id number]) Plating of right 3rd costal cartilage (KLS titanium plate)

## 2022-08-21 NOTE — Telephone Encounter (Signed)
I printed off letter for patient to pickup. He verbalized understanding.

## 2022-08-21 NOTE — Telephone Encounter (Signed)
Patient called back needs MAKE, MODEL and SERIAL NUMBER and date of his valve replacement. [facility] is unable to request the information for him. He can not have MRI until he gives them this information.

## 2022-09-26 ENCOUNTER — Other Ambulatory Visit: Payer: Self-pay

## 2022-09-26 ENCOUNTER — Emergency Department (HOSPITAL_COMMUNITY)
Admission: EM | Admit: 2022-09-26 | Discharge: 2022-09-26 | Disposition: A | Discharge status: Home / Self Care | Payer: No Typology Code available for payment source | Attending: Emergency Medicine | Admitting: Emergency Medicine

## 2022-09-26 ENCOUNTER — Emergency Department (HOSPITAL_COMMUNITY): Payer: No Typology Code available for payment source

## 2022-09-26 DIAGNOSIS — I130 Hypertensive heart and chronic kidney disease with heart failure and stage 1 through stage 4 chronic kidney disease, or unspecified chronic kidney disease: Secondary | ICD-10-CM | POA: Insufficient documentation

## 2022-09-26 DIAGNOSIS — R0789 Other chest pain: Secondary | ICD-10-CM | POA: Insufficient documentation

## 2022-09-26 DIAGNOSIS — N132 Hydronephrosis with renal and ureteral calculous obstruction: Secondary | ICD-10-CM | POA: Insufficient documentation

## 2022-09-26 DIAGNOSIS — Z7984 Long term (current) use of oral hypoglycemic drugs: Secondary | ICD-10-CM | POA: Insufficient documentation

## 2022-09-26 DIAGNOSIS — D72829 Elevated white blood cell count, unspecified: Secondary | ICD-10-CM | POA: Insufficient documentation

## 2022-09-26 DIAGNOSIS — N189 Chronic kidney disease, unspecified: Secondary | ICD-10-CM | POA: Diagnosis not present

## 2022-09-26 DIAGNOSIS — Z7982 Long term (current) use of aspirin: Secondary | ICD-10-CM | POA: Insufficient documentation

## 2022-09-26 DIAGNOSIS — Z79899 Other long term (current) drug therapy: Secondary | ICD-10-CM | POA: Insufficient documentation

## 2022-09-26 DIAGNOSIS — Y92410 Unspecified street and highway as the place of occurrence of the external cause: Secondary | ICD-10-CM | POA: Insufficient documentation

## 2022-09-26 DIAGNOSIS — M542 Cervicalgia: Secondary | ICD-10-CM | POA: Insufficient documentation

## 2022-09-26 DIAGNOSIS — R930 Abnormal findings on diagnostic imaging of skull and head, not elsewhere classified: Secondary | ICD-10-CM | POA: Diagnosis not present

## 2022-09-26 DIAGNOSIS — I509 Heart failure, unspecified: Secondary | ICD-10-CM | POA: Diagnosis not present

## 2022-09-26 DIAGNOSIS — M549 Dorsalgia, unspecified: Secondary | ICD-10-CM | POA: Insufficient documentation

## 2022-09-26 DIAGNOSIS — N200 Calculus of kidney: Secondary | ICD-10-CM

## 2022-09-26 DIAGNOSIS — R1011 Right upper quadrant pain: Secondary | ICD-10-CM | POA: Diagnosis present

## 2022-09-26 DIAGNOSIS — I10 Essential (primary) hypertension: Secondary | ICD-10-CM | POA: Diagnosis not present

## 2022-09-26 LAB — CBC
HCT: 37.8 % — ABNORMAL LOW (ref 39.0–52.0)
Hemoglobin: 11.8 g/dL — ABNORMAL LOW (ref 13.0–17.0)
MCH: 27.6 pg (ref 26.0–34.0)
MCHC: 31.2 g/dL (ref 30.0–36.0)
MCV: 88.5 fL (ref 80.0–100.0)
Platelets: 181 10*3/uL (ref 150–400)
RBC: 4.27 MIL/uL (ref 4.22–5.81)
RDW: 13.8 % (ref 11.5–15.5)
WBC: 4 10*3/uL (ref 4.0–10.5)
nRBC: 0 % (ref 0.0–0.2)

## 2022-09-26 LAB — URINALYSIS, ROUTINE W REFLEX MICROSCOPIC
Bilirubin Urine: NEGATIVE
Glucose, UA: NEGATIVE mg/dL
Hgb urine dipstick: NEGATIVE
Ketones, ur: NEGATIVE mg/dL
Nitrite: NEGATIVE
Protein, ur: NEGATIVE mg/dL
Specific Gravity, Urine: >1.046 — ABNORMAL HIGH (ref 1.005–1.030)
pH: 5 (ref 5.0–8.0)

## 2022-09-26 LAB — BASIC METABOLIC PANEL
Anion gap: 7 (ref 5–15)
BUN: 8 mg/dL (ref 8–23)
CO2: 25 mmol/L (ref 22–32)
Calcium: 8.9 mg/dL (ref 8.9–10.3)
Chloride: 106 mmol/L (ref 98–111)
Creatinine, Ser: 1.38 mg/dL — ABNORMAL HIGH (ref 0.61–1.24)
GFR, Estimated: 57 mL/min — ABNORMAL LOW (ref >60)
Glucose, Bld: 97 mg/dL (ref 70–99)
Potassium: 4 mmol/L (ref 3.5–5.1)
Sodium: 138 mmol/L (ref 135–145)

## 2022-09-26 MED ORDER — CEPHALEXIN 500 MG PO CAPS: 500.0000 mg | ORAL_CAPSULE | Freq: Four times a day (QID) | ORAL | 0 refills | Status: DC

## 2022-09-26 MED ORDER — MORPHINE SULFATE (PF) 4 MG/ML IV SOLN
4.0000 mg | Freq: Once | INTRAVENOUS | Status: AC
  Administered 2022-09-26: 4 mg via INTRAVENOUS
  Filled 2022-09-26: qty 1

## 2022-09-26 MED ORDER — KETOROLAC TROMETHAMINE 15 MG/ML IJ SOLN
15.0000 mg | Freq: Once | INTRAMUSCULAR | Status: AC
  Administered 2022-09-26: 15 mg via INTRAVENOUS
  Filled 2022-09-26: qty 1

## 2022-09-26 MED ORDER — TAMSULOSIN HCL 0.4 MG PO CAPS: 0.4000 mg | ORAL_CAPSULE | Freq: Every day | ORAL | 0 refills | Status: AC

## 2022-09-26 MED ORDER — ONDANSETRON HCL 4 MG PO TABS: 4.0000 mg | ORAL_TABLET | Freq: Four times a day (QID) | ORAL | 0 refills | Status: AC

## 2022-09-26 MED ORDER — IOHEXOL 350 MG/ML SOLN
80.0000 mL | Freq: Once | INTRAVENOUS | Status: AC | PRN
  Administered 2022-09-26: 80 mL via INTRAVENOUS

## 2022-09-26 MED ORDER — OXYCODONE-ACETAMINOPHEN 5-325 MG PO TABS: 1.0000 | ORAL_TABLET | Freq: Four times a day (QID) | ORAL | 0 refills | Status: DC | PRN

## 2022-09-26 NOTE — Discharge Instructions (Addendum)
You were seen in the emergency department and found to have a kidney stone.  Please take ibuprofen as needed for pain.  We are sending you home with multiple medications to assist with passing the stone and for residual pain/nausea:  -Flomax-this is a medication to help pass the stone, it allows urine to exit the body more freely.  Please take this once daily with a meal.  -Percocet-this is a narcotic/controlled substance medication that has potential addicting qualities.  We recommend that you take 1-2 tablets every 6 hours as needed for severe pain.  Do not drive or operate heavy machinery when taking this medicine as it can be sedating. Do not drink alcohol or take other sedating medications when taking this medicine for safety reasons.  Keep this out of reach of small children.  Please be aware this medicine has Tylenol in it (325 mg/tab) do not exceed the maximum dose of Tylenol in a day per over the counter recommendations should you decide to supplement with Tylenol over the counter.   -Zofran-this is an antinausea medication, you may take this every 8 hours as needed for nausea and vomiting, please allow the tablet to dissolve underneath of your tongue.   We have prescribed you new medication(s) today. Discuss the medications prescribed today with your pharmacist as they can have adverse effects and interactions with your other medicines including over the counter and prescribed medications. Seek medical evaluation if you start to experience new or abnormal symptoms after taking one of these medicines, seek care immediately if you start to experience difficulty breathing, feeling of your throat closing, facial swelling, or rash as these could be indications of a more serious allergic reaction  Please follow-up with the urology group provided in your discharge instructions within 3 to 5 days.  Return to the ER for new or worsening symptoms including but not limited to worsening pain not controlled  by these medicines, inability to keep fluids down, fever, or any other concerns that you may have.   Please take the entire course of antibiotics that I prescribed
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
## 2022-09-27 LAB — URINE CULTURE: Culture: NO GROWTH

## 2022-10-02 ENCOUNTER — Encounter (HOSPITAL_COMMUNITY): Payer: Self-pay

## 2022-10-02 ENCOUNTER — Other Ambulatory Visit: Payer: Self-pay

## 2022-10-02 ENCOUNTER — Emergency Department (HOSPITAL_COMMUNITY)
Admission: EM | Admit: 2022-10-02 | Discharge: 2022-10-02 | Disposition: A | Discharge status: Home / Self Care | Payer: Medicare Other | Attending: Emergency Medicine | Admitting: Emergency Medicine

## 2022-10-02 DIAGNOSIS — Z7982 Long term (current) use of aspirin: Secondary | ICD-10-CM | POA: Diagnosis not present

## 2022-10-02 DIAGNOSIS — M542 Cervicalgia: Secondary | ICD-10-CM | POA: Diagnosis not present

## 2022-10-02 DIAGNOSIS — S80811A Abrasion, right lower leg, initial encounter: Secondary | ICD-10-CM | POA: Insufficient documentation

## 2022-10-02 DIAGNOSIS — Y92410 Unspecified street and highway as the place of occurrence of the external cause: Secondary | ICD-10-CM | POA: Diagnosis not present

## 2022-10-02 DIAGNOSIS — M79661 Pain in right lower leg: Secondary | ICD-10-CM | POA: Diagnosis present

## 2022-10-02 MED ORDER — METHOCARBAMOL 500 MG PO TABS: 500.0000 mg | ORAL_TABLET | Freq: Every evening | ORAL | 0 refills | Status: AC

## 2022-10-02 MED ORDER — ACETAMINOPHEN 500 MG PO TABS: 500.0000 mg | ORAL_TABLET | Freq: Four times a day (QID) | ORAL | 0 refills | Status: AC | PRN

## 2022-10-02 MED ORDER — NAPROXEN 500 MG PO TABS: 500.0000 mg | ORAL_TABLET | Freq: Two times a day (BID) | ORAL | 0 refills | Status: AC
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
## 2022-10-09 ENCOUNTER — Telehealth: Payer: Self-pay

## 2022-10-09 NOTE — Telephone Encounter (Signed)
Transition Care Management Follow-up Telephone Call Date of discharge and from where: 10/02/2022 [facility] How have you been since you were released from the hospital? Patient stated he is feeling better. Any questions or concerns? No  Items Reviewed: Did the pt receive and understand the discharge instructions provided? Yes  Medications obtained and verified? Yes  Other? No  Any new allergies since your discharge? Yes  Dietary orders reviewed? Yes Do you have support at home? Yes   Follow up appointments reviewed:  PCP Hospital f/u appt confirmed? No  Scheduled to see  on  @ . Specialist Hospital f/u appt confirmed? No  Scheduled to see  on  @ . Are transportation arrangements needed? No  If their condition worsens, is the pt aware to call PCP or go to the Emergency Dept.? Yes Was the patient provided with contact information for the PCP's office or ED? Yes Was to pt encouraged to call back with questions or concerns? Yes  Laticha Ferrucci [facility]  [facility] Population Health Community Resource Care Guide   [email or web address]  ?? [phone]   Website: [email or web address]  [email or web address]

## 2022-10-17 ENCOUNTER — Other Ambulatory Visit: Payer: Self-pay | Admitting: Urology
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
## 2022-10-19 ENCOUNTER — Encounter: Payer: Self-pay | Admitting: Cardiology

## 2022-10-23 ENCOUNTER — Encounter: Payer: Self-pay | Admitting: Neurology

## 2022-10-23 ENCOUNTER — Ambulatory Visit (INDEPENDENT_AMBULATORY_CARE_PROVIDER_SITE_OTHER): Payer: No Typology Code available for payment source | Admitting: Neurology

## 2022-10-23 VITALS — BP 139/89 | HR 68 | Ht 71.0 in | Wt 264.8 lb

## 2022-10-23 DIAGNOSIS — M4802 Spinal stenosis, cervical region: Secondary | ICD-10-CM

## 2022-10-23 DIAGNOSIS — G43009 Migraine without aura, not intractable, without status migrainosus: Secondary | ICD-10-CM

## 2022-10-23 DIAGNOSIS — G4733 Obstructive sleep apnea (adult) (pediatric): Secondary | ICD-10-CM

## 2022-10-23 MED ORDER — TOPIRAMATE 50 MG PO TABS
50.0000 mg | ORAL_TABLET | Freq: Every day | ORAL | 5 refills | Status: DC
Start: 2022-10-23 — End: 2022-10-23

## 2022-10-23 NOTE — Patient Instructions (Signed)
Continue Qulipta. Stop topiramate.  Start nortriptyline 10mg  at bedtime.  If no improvement in headaches in 4 weeks, contact me and we can increase dose Ubrelvy as needed. Keep headache diary Limit use of pain relievers to no more than 2 days out of week to prevent risk of rebound or medication-overuse headache. Follow up 6 months.

## 2022-10-24 ENCOUNTER — Encounter (HOSPITAL_COMMUNITY): Payer: Self-pay

## 2022-10-29 NOTE — Progress Notes (Addendum)
COVID Vaccine received:  []  No [x]  Yes Date of any COVID positive Test in last 90 days:  PCP - [facility]   Floreen Comber, [place] Cardiologist - Yates Decamp, MD  cardia clearance on chart Neurology- Shon Millet, DO   Chest x-ray -08-08-2022    2v  CE [facility]) EKG -  09-27-2022  Epic Stress Test -  ECHO - 10-09-2020  Epic Cardiac Cath - 2020  Epic  PCR screen: []  Ordered & Completed           []   No Order but Needs PROFEND           [x]   N/A for this surgery  Surgery Plan:  [x]  Ambulatory                            []  Outpatient in bed                            []  Admit  Anesthesia:    [x]  General  []  Spinal                           []   Choice []   MAC  Bowel Prep - [x]  No  []   Yes ______  Pacemaker / ICD device [x]  No []  Yes   Spinal Cord Stimulator:[x]  No []  Yes       History of Sleep Apnea? []  No [x]  Yes   CPAP used?- []  No [x]  Yes    Does the patient monitor blood sugar?          []  No []  Yes  [x]  N/A  Patient has: [x]  NO Hx DM   []  Pre-DM                 []  DM1  []   DM2 Does patient have a [employer] or Dexacom? []  No []  Yes   Fasting Blood Sugar Ranges-  Checks Blood Sugar _____ times a day  Blood Thinner / Instructions:  none Aspirin Instructions:  ASA 81 mg   To be continued per Dr. Jacinto Halim  ERAS Protocol Ordered: [x]  No  []  Yes Patient is to be NPO after:  midnight prior  Comments:   Activity level: Patient is able / unable to climb a flight of stairs without difficulty; []  No CP  []  No SOB, but would have ___   Patient can / can not perform ADLs without assistance.   Anesthesia review: s/p AVR w/ bioprosthetic valve 12-15-2020, HTN, OSA-CPAP, Migraines, CHF, AV block, CKD2, anxiety,   Patient denies shortness of breath, fever, cough and chest pain at PAT appointment.  Patient verbalized understanding and agreement to the Pre-Surgical Instructions that were given to them at this PAT appointment. Patient was also educated of the  need to review these PAT instructions again prior to his surgery.I reviewed the appropriate phone numbers to call if they have any and questions or concerns.

## 2022-10-29 NOTE — Patient Instructions (Addendum)
SURGICAL WAITING ROOM VISITATION Patients having surgery or a procedure may have no more than 2 support people in the waiting area - these visitors may rotate in the visitor waiting room.   Due to an increase in RSV and influenza rates and associated hospitalizations, children ages 57 and under may not visit patients in [facility] hospitals. If the patient needs to stay at the hospital during part of their recovery, the visitor guidelines for inpatient rooms apply.  PRE-OP VISITATION  Pre-op nurse will coordinate an appropriate time for 1 support person to accompany the patient in pre-op.  This support person may not rotate.  This visitor will be contacted when the time is appropriate for the visitor to come back in the pre-op area.  Please refer to the [facility] website for the visitor guidelines for Inpatients (after your surgery is over and you are in a regular room).  You are not required to quarantine at this time prior to your surgery. However, you must do this: Hand Hygiene often Do NOT share personal items Notify your provider if you are in close contact with someone who has COVID or you develop fever 100.4 or greater, new onset of sneezing, cough, sore throat, shortness of breath or body aches.  If you test positive for Covid or have been in contact with anyone that has tested positive in the last 10 days please notify you surgeon.    Your procedure is scheduled on:  Monday  November 12, 2022  Report to [facility] Main Entrance: [place] entrance where the [employer] is available.   Report to admitting at:  10:45  AM  +++++Call this number if you have any questions or problems the morning of surgery [phone]  DO NOT EAT OR DRINK ANYTHING AFTER MIDNIGHT THE NIGHT PRIOR TO YOUR SURGERY / PROCEDURE.   FOLLOW BOWEL PREP AND ANY ADDITIONAL PRE OP INSTRUCTIONS YOU RECEIVED FROM YOUR SURGEON'S OFFICE!!!   Oral Hygiene is also important to reduce your risk of infection.         Remember - BRUSH YOUR TEETH THE MORNING OF SURGERY WITH YOUR REGULAR TOOTHPASTE  Do NOT smoke after Midnight the night before surgery.  Take ONLY these medicines the morning of surgery with A SIP OF WATER: ???  Amlodipine, carvedilol, Sertraline,    If You have been diagnosed with Sleep Apnea - Bring CPAP mask and tubing day of surgery. We will provide you with a CPAP machine on the day of your surgery.                   You may not have any metal on your body including jewelry, and body piercing  Do not wear  lotions, perfumes / cologne, or deodorant  Men may shave face and neck.  Contacts, Hearing Aids, dentures or bridgework may not be worn into surgery. DENTURES WILL BE REMOVED PRIOR TO SURGERY PLEASE DO NOT APPLY "Poly grip" OR ADHESIVES!!!   Patients discharged on the day of surgery will not be allowed to drive home.  Someone NEEDS to stay with you for the first 24 hours after anesthesia.  Do not bring your home medications to the hospital. The Pharmacy will dispense medications listed on your medication list to you during your admission in the Hospital.  Special Instructions: Bring a copy of your healthcare power of attorney and living will documents the day of surgery, if you wish to have them scanned into your [facility] Medical Records- EPIC  Please read over the following fact sheets you were given: IF YOU HAVE QUESTIONS ABOUT YOUR PRE-OP INSTRUCTIONS, PLEASE CALL [phone].   [facility] Riggs - Preparing for Surgery Before surgery, you can play an important role.  Because skin is not sterile, your skin needs to be as free of germs as possible.  You can reduce the number of germs on your skin by washing with CHG (chlorahexidine gluconate) soap before surgery.  CHG is an antiseptic cleaner which kills germs and bonds with the skin to continue killing germs even after washing. Please DO NOT use if you have an allergy to CHG or antibacterial soaps.  If your skin becomes  reddened/irritated stop using the CHG and inform your nurse when you arrive at Short Stay. Do not shave (including legs and underarms) for at least 48 hours prior to the first CHG shower.  You may shave your face/neck.  Please follow these instructions carefully:  1.  Shower with CHG Soap the night before surgery and the  morning of surgery.  2.  If you choose to wash your hair, wash your hair first as usual with your normal  shampoo.  3.  After you shampoo, rinse your hair and body thoroughly to remove the shampoo.                             4.  Use CHG as you would any other liquid soap.  You can apply chg directly to the skin and wash.  Gently with a scrungie or clean washcloth.  5.  Apply the CHG Soap to your body ONLY FROM THE NECK DOWN.   Do not use on face/ open                           Wound or open sores. Avoid contact with eyes, ears mouth and genitals (private parts).                       Wash face,  Genitals (private parts) with your normal soap.             6.  Wash thoroughly, paying special attention to the area where your  surgery  will be performed.  7.  Thoroughly rinse your body with warm water from the neck down.  8.  DO NOT shower/wash with your normal soap after using and rinsing off the CHG Soap.            9.  Pat yourself dry with a clean towel.            10.  Wear clean pajamas.            11.  Place clean sheets on your bed the night of your first shower and do not  sleep with pets.  ON THE DAY OF SURGERY : Do not apply any lotions/deodorants the morning of surgery.  Please wear clean clothes to the hospital/surgery center.    FAILURE TO FOLLOW THESE INSTRUCTIONS MAY RESULT IN THE CANCELLATION OF YOUR SURGERY  PATIENT SIGNATURE_________________________________  NURSE SIGNATURE__________________________________  ________________________________________________________________________

## 2022-10-31 ENCOUNTER — Encounter (HOSPITAL_COMMUNITY): Payer: Self-pay

## 2022-10-31 ENCOUNTER — Other Ambulatory Visit: Payer: Self-pay

## 2022-10-31 ENCOUNTER — Encounter (HOSPITAL_COMMUNITY)
Admission: RE | Admit: 2022-10-31 | Discharge: 2022-10-31 | Disposition: A | Discharge status: Home / Self Care | Payer: No Typology Code available for payment source | Source: Ambulatory Visit | Attending: Urology | Admitting: Urology

## 2022-10-31 VITALS — BP 143/92 | HR 74 | Temp 98.0°F | Resp 18 | Ht 71.75 in | Wt 254.0 lb

## 2022-10-31 DIAGNOSIS — Z952 Presence of prosthetic heart valve: Secondary | ICD-10-CM | POA: Diagnosis not present

## 2022-10-31 DIAGNOSIS — I509 Heart failure, unspecified: Secondary | ICD-10-CM | POA: Diagnosis not present

## 2022-10-31 DIAGNOSIS — R7303 Prediabetes: Secondary | ICD-10-CM

## 2022-10-31 DIAGNOSIS — N182 Chronic kidney disease, stage 2 (mild): Secondary | ICD-10-CM | POA: Insufficient documentation

## 2022-10-31 DIAGNOSIS — Z01812 Encounter for preprocedural laboratory examination: Secondary | ICD-10-CM | POA: Diagnosis present

## 2022-10-31 DIAGNOSIS — G4733 Obstructive sleep apnea (adult) (pediatric): Secondary | ICD-10-CM | POA: Insufficient documentation

## 2022-10-31 DIAGNOSIS — N201 Calculus of ureter: Secondary | ICD-10-CM | POA: Insufficient documentation

## 2022-10-31 DIAGNOSIS — I130 Hypertensive heart and chronic kidney disease with heart failure and stage 1 through stage 4 chronic kidney disease, or unspecified chronic kidney disease: Secondary | ICD-10-CM | POA: Diagnosis not present

## 2022-10-31 DIAGNOSIS — I447 Left bundle-branch block, unspecified: Secondary | ICD-10-CM | POA: Diagnosis not present

## 2022-10-31 DIAGNOSIS — I2510 Atherosclerotic heart disease of native coronary artery without angina pectoris: Secondary | ICD-10-CM

## 2022-10-31 HISTORY — DX: Prediabetes: R73.03

## 2022-10-31 LAB — BASIC METABOLIC PANEL
Anion gap: 8 (ref 5–15)
BUN: 12 mg/dL (ref 8–23)
CO2: 27 mmol/L (ref 22–32)
Calcium: 9.1 mg/dL (ref 8.9–10.3)
Chloride: 105 mmol/L (ref 98–111)
Creatinine, Ser: 1.24 mg/dL (ref 0.61–1.24)
GFR, Estimated: >60 mL/min (ref >60)
Glucose, Bld: 104 mg/dL — ABNORMAL HIGH (ref 70–99)
Potassium: 4 mmol/L (ref 3.5–5.1)
Sodium: 140 mmol/L (ref 135–145)

## 2022-10-31 LAB — CBC
HCT: 39.3 % (ref 39.0–52.0)
Hemoglobin: 12.5 g/dL — ABNORMAL LOW (ref 13.0–17.0)
MCH: 27.8 pg (ref 26.0–34.0)
MCHC: 31.8 g/dL (ref 30.0–36.0)
MCV: 87.5 fL (ref 80.0–100.0)
Platelets: 207 10*3/uL (ref 150–400)
RBC: 4.49 MIL/uL (ref 4.22–5.81)
RDW: 13.8 % (ref 11.5–15.5)
WBC: 3.7 10*3/uL — ABNORMAL LOW (ref 4.0–10.5)
nRBC: 0 % (ref 0.0–0.2)

## 2022-10-31 LAB — GLUCOSE, CAPILLARY: Glucose-Capillary: 101 mg/dL — ABNORMAL HIGH (ref 70–99)

## 2022-11-01 LAB — HEMOGLOBIN A1C
Hgb A1c MFr Bld: 6.2 % — ABNORMAL HIGH (ref 4.8–5.6)
Mean Plasma Glucose: 131 mg/dL

## 2022-11-01 NOTE — Anesthesia Preprocedure Evaluation (Addendum)
Anesthesia Evaluation  Patient identified by MRN, date of birth, ID band Patient awake    Reviewed: Allergy & Precautions, NPO status , Patient's Chart, lab work & pertinent test results  History of Anesthesia Complications Negative for: history of anesthetic complications  Airway Mallampati: II  TM Distance: >3 FB Neck ROM: Full    Dental  (+) Dental Advisory Given, Missing   Pulmonary asthma , sleep apnea and Continuous Positive Airway Pressure Ventilation , COPD,  COPD inhaler, former smoker   breath sounds clear to auscultation       Cardiovascular hypertension, Pt. on medications and Pt. on home beta blockers (-) angina +CHF  + Valvular Problems/Murmurs (hx severe aortic insufficiency and  quadricuspid aortic valve (s/p AVR with bioprosthetic valve 12/16/18)  Rhythm:Regular Rate:Normal  09/2020 ECHO: EF 55-60%, mod concentric hypertrophy of the LV, trace-mild AS, mean grad 9 mmHg, peak gradient 18 mmHg  09/2018 cath: no sig CAD, mild pulm HTN   Neuro/Psych   Anxiety Depression    negative neurological ROS     GI/Hepatic Neg liver ROS,GERD  Controlled,,  Endo/Other    Morbid obesity  Renal/GU Renal InsufficiencyRenal disease     Musculoskeletal  (+) Arthritis ,    Abdominal  (+) + obese  Peds  Hematology  (+) Blood dyscrasia, anemia   Anesthesia Other Findings   Reproductive/Obstetrics                             Anesthesia Physical Anesthesia Plan  ASA: 3  Anesthesia Plan: General   Post-op Pain Management: Minimal or no pain anticipated   Induction: Intravenous  PONV Risk Score and Plan: 2 and Ondansetron, Dexamethasone and Treatment may vary due to age or medical condition  Airway Management Planned: LMA and Oral ETT  Additional Equipment: None  Intra-op Plan:   Post-operative Plan: Extubation in OR  Informed Consent:   Plan Discussed with: CRNA, Surgeon and  Anesthesiologist  Anesthesia Plan Comments: ( DISCUSSION:66 y.o. former smoker with h/o HTN, CHF, LBBB, OSA on CPAP, s/p aortic valve replacement 12/16/2018, CKD Stage II, right ureteral stone scheduled for above procedure 11/12/2022 with Dr. Jettie Pagan.    Per cardiology preoperative note 10/19/22, "Iyan Kincade Allton. is at low risk, from a cardiac standpoint, for his upcoming procedure: cystoscopy, right retrograde pyelogram, right ureteroscopy, laser lithotripsy, right stent.  It is ok to proceed without further cardiac testing.  Echocardiogram 10/07/2020: Normal LV systolic function with visual EF 55-60%. Left ventricle cavity is small. Moderate concentric hypertrophy of the left ventricle. Normal global wall motion. Normal diastolic filling pattern. Patient has A 25mm an Edwards bovine bioprosthetic valve in the aortic position. Procedure Date: 12/16/2018. No AV regurgitation. Trace to mild aortic valve  stenosis. Peak velocity 2.11 m/s, Peak Gradient 18 mmHg, Mean Gradient  9.3 mmHg, AVA 1.7cm. No significant change from 12/29/2018.      Cardiac Cath 10/14/2018 LM: Normal LAD: Normal Ramus: Normal LCx: Normal RCA: Normal   Angiographically normal coronary arteries with no significant CAD Mild pulmonary hypertnension mean PA 26 mmHg )        Anesthesia Quick Evaluation

## 2022-11-01 NOTE — Progress Notes (Signed)
Anesthesia Chart Review   Case: [id number] Date/Time: 11/12/22 1245   Procedure: CYSTOSCOPY/RIGHT RETROGRADE PYELOGRAM/RIGHT URETEROSCOPY/HOLMIUM LASER/RIGHT STENT PLACEMENT (Right)   Anesthesia type: General   Pre-op diagnosis: RIGHT URETERAL STONE   Location: [facility] PROCEDURE [facility] / [facility]   Surgeons: Jannifer Hick, MD       DISCUSSION:66 y.o. former smoker with h/o HTN, CHF, LBBB, OSA on CPAP, s/p aortic valve replacement 12/16/2018, CKD Stage II, right ureteral stone scheduled for above procedure 11/12/2022 with Dr. Jettie Pagan.   Per cardiology preoperative note 10/19/22, "Charles Riggs. is at low risk, from a cardiac standpoint, for his upcoming procedure: cystoscopy, right retrograde pyelogram, right ureteroscopy, laser lithotripsy, right stent.  It is ok to proceed without further cardiac testing.   If possible, please continue Aspirin perioperatively. If it must be held, please minimize interruption to no more than 3-5 days."   VS: BP (!) 143/92   Pulse 74   Temp 36.7 C (Oral)   Resp 18   Ht 5' 11.75" (1.822 m)   Wt 115.2 kg   SpO2 98%   BMI 34.69 kg/m   PROVIDERS: [facility]  Cardiologist - Yates Decamp, MD  LABS: Labs reviewed: Acceptable for surgery. (all labs ordered are listed, but only abnormal results are displayed)  Labs Reviewed  BASIC METABOLIC PANEL - Abnormal; Notable for the following components:      Result Value   Glucose, Bld 104 (*)    All other components within normal limits  CBC - Abnormal; Notable for the following components:   WBC 3.7 (*)    Hemoglobin 12.5 (*)    All other components within normal limits  GLUCOSE, CAPILLARY - Abnormal; Notable for the following components:   Glucose-Capillary 101 (*)    All other components within normal limits  HEMOGLOBIN A1C - Abnormal; Notable for the following components:   Hgb A1c MFr Bld 6.2 (*)    All other components within normal limits     IMAGES:   EKG:   CV: Echocardiogram  10/07/2020: Normal LV systolic function with visual EF 55-60%. Left ventricle cavity is small. Moderate concentric hypertrophy of the left ventricle. Normal global wall motion. Normal diastolic filling pattern. Patient has A 25mm an Edwards bovine bioprosthetic valve in the aortic position. Procedure Date: 12/16/2018. No AV regurgitation. Trace to mild aortic valve  stenosis. Peak velocity 2.11 m/s, Peak Gradient 18 mmHg, Mean Gradient  9.3 mmHg, AVA 1.7cm. No significant change from 12/29/2018.      Echo 12/29/2018 1. The left ventricle has mildly reduced systolic function, with an  ejection fraction of 45-50%. The cavity size was normal. Left ventricular  diastolic parameters were normal. There is abnormal septal motion  consistent with post-operative status.   2. The right ventricle has normal systolc function. There is no increase  in right ventricular wall thickness.   3. Trivial circumferential pericardial effusion is present.   4. A 25mm an Edwards bovine bioprosthesis valve is present in the aortic  position. Procedure Date: 12/16/2018 Normal aortic valve prosthesis. Mean  PG 13 mmHg, which is likely normal. No regurgitation.   5. The ascending aorta is normal in size and structure.   6. Compared to previous transthoracic study on 08/27/2018, bioprosthetic  aortic valve is new.   Cardiac Cath 10/14/2018 LM: Normal LAD: Normal Ramus: Normal LCx: Normal RCA: Normal   Angiographically normal coronary arteries with no significant CAD Mild pulmonary hypertnension mean PA 26 mmHg Past Medical History:  Diagnosis Date   Anemia in chronic kidney disease    Anxiety    Aortic insufficiency    Arthritis    Asthma    CHF (congestive heart failure) (HCC)    CKD (chronic kidney disease)    Essential hypertension    Nerve pain    Non-rheumatic aortic regurgitation    Obstructive sleep apnea    cpap   Pre-diabetes    S/P minimally invasive aortic valve replacement with  bioprosthetic valve 12/16/2018   25 mm Edwards Inspiris Resilia stented bovine pericardial tissue valve via right mini thoracotomy approach   SOB (shortness of breath)    with exertion   Syncope 12/2018    Past Surgical History:  Procedure Laterality Date   AORTIC VALVE REPLACEMENT N/A 12/16/2018   Procedure: MINIMALLY INVASIVE AORTIC VALVE REPLACEMENT (AVR) using Inspiris Aortic valve 25mm.;  Surgeon: Purcell Nails, MD;  Location: [facility] OR;  Service: Open Heart Surgery;  Laterality: N/A;   BACK SURGERY  2023   for stenosis   HERNIA REPAIR     age 72   IR THORACENTESIS ASP PLEURAL SPACE W/IMG GUIDE  12/29/2018   REVERSE SHOULDER ARTHROPLASTY Right 06/01/2021   Procedure: REVERSE SHOULDER ARTHROPLASTY;  Surgeon: Jones Broom, MD;  Location: [facility];  Service: Orthopedics;  Laterality: Right;   RIB PLATING  12/16/2018   Procedure: Rib Plating;  Surgeon: Purcell Nails, MD;  Location: [facility] OR;  Service: Open Heart Surgery;;   RIGHT/LEFT HEART CATH AND CORONARY ANGIOGRAPHY N/A 10/14/2018   Procedure: RIGHT/LEFT HEART CATH AND CORONARY ANGIOGRAPHY;  Surgeon: Elder Negus, MD;  Location: [facility] INVASIVE CV LAB;  Service: Cardiovascular;  Laterality: N/A;   TEE WITHOUT CARDIOVERSION N/A 11/11/2018   Procedure: TRANSESOPHAGEAL ECHOCARDIOGRAM (TEE);  Surgeon: Elder Negus, MD;  Location: [facility] ENDOSCOPY;  Service: Cardiovascular;  Laterality: N/A;   TEE WITHOUT CARDIOVERSION N/A 12/16/2018   Procedure: TRANSESOPHAGEAL ECHOCARDIOGRAM (TEE);  Surgeon: Purcell Nails, MD;  Location: [facility] OR;  Service: Open Heart Surgery;  Laterality: N/A;   TOTAL HIP ARTHROPLASTY Left 03/09/2019   Procedure: LEFT TOTAL HIP ARTHROPLASTY ANTERIOR APPROACH;  Surgeon: Tarry Kos, MD;  Location: [facility] OR;  Service: Orthopedics;  Laterality: Left;    MEDICATIONS:  albuterol (PROVENTIL HFA;VENTOLIN HFA) 108 (90 Base) MCG/ACT inhaler   albuterol (PROVENTIL) (2.5 MG/3ML) 0.083% nebulizer solution   amLODipine  (NORVASC) 10 MG tablet   aspirin EC 81 MG tablet   Atogepant (QULIPTA) 60 MG TABS   baclofen (LIORESAL) 10 MG tablet   budesonide-formoterol (SYMBICORT) 160-4.5 MCG/ACT inhaler   carvedilol (COREG) 6.25 MG tablet   celecoxib (CELEBREX) 100 MG capsule   Cholecalciferol (VITAMIN D3) 50 MCG (2000 UT) TABS   ferrous sulfate 325 (65 FE) MG tablet   fluticasone (FLONASE) 50 MCG/ACT nasal spray   Fluticasone-Salmeterol (ADVAIR) 250-50 MCG/DOSE AEPB   guaiFENesin (MUCINEX) 600 MG 12 hr tablet   lidocaine (LIDODERM) 5 %   losartan (COZAAR) 50 MG tablet   metFORMIN (GLUCOPHAGE) 500 MG tablet   metoprolol tartrate (LOPRESSOR) 50 MG tablet   montelukast (SINGULAIR) 10 MG tablet   omeprazole (PRILOSEC) 40 MG capsule   ondansetron (ZOFRAN) 4 MG tablet   polycarbophil (FIBERCON) 625 MG tablet   rosuvastatin (CRESTOR) 20 MG tablet   sertraline (ZOLOFT) 100 MG tablet   tamsulosin (FLOMAX) 0.4 MG CAPS capsule   terbinafine (LAMISIL) 1 % cream   Ubrogepant (UBRELVY) 100 MG TABS   No current facility-administered medications for this encounter.   Charles Cipro  Ward, PA-C [facility] Pre-Surgical Testing [phone]

## 2022-11-12 ENCOUNTER — Other Ambulatory Visit: Payer: Self-pay

## 2022-11-12 ENCOUNTER — Ambulatory Visit (HOSPITAL_BASED_OUTPATIENT_CLINIC_OR_DEPARTMENT_OTHER): Payer: No Typology Code available for payment source | Admitting: Anesthesiology

## 2022-11-12 ENCOUNTER — Ambulatory Visit (HOSPITAL_COMMUNITY): Payer: No Typology Code available for payment source | Admitting: Physician Assistant

## 2022-11-12 ENCOUNTER — Ambulatory Visit (HOSPITAL_COMMUNITY): Payer: No Typology Code available for payment source

## 2022-11-12 ENCOUNTER — Encounter (HOSPITAL_COMMUNITY): Payer: Self-pay | Admitting: Urology

## 2022-11-12 ENCOUNTER — Ambulatory Visit (HOSPITAL_COMMUNITY)
Admission: RE | Admit: 2022-11-12 | Discharge: 2022-11-12 | Disposition: A | Discharge status: Home / Self Care | Payer: No Typology Code available for payment source | Source: Ambulatory Visit | Attending: Urology | Admitting: Urology

## 2022-11-12 ENCOUNTER — Encounter (HOSPITAL_COMMUNITY)
Admission: RE | Disposition: A | Discharge status: Home / Self Care | Payer: Self-pay | Source: Ambulatory Visit | Attending: Urology

## 2022-11-12 DIAGNOSIS — G4733 Obstructive sleep apnea (adult) (pediatric): Secondary | ICD-10-CM | POA: Insufficient documentation

## 2022-11-12 DIAGNOSIS — N201 Calculus of ureter: Secondary | ICD-10-CM | POA: Diagnosis not present

## 2022-11-12 DIAGNOSIS — E669 Obesity, unspecified: Secondary | ICD-10-CM | POA: Diagnosis not present

## 2022-11-12 DIAGNOSIS — Z6834 Body mass index (BMI) 34.0-34.9, adult: Secondary | ICD-10-CM | POA: Insufficient documentation

## 2022-11-12 DIAGNOSIS — Z79899 Other long term (current) drug therapy: Secondary | ICD-10-CM | POA: Diagnosis not present

## 2022-11-12 DIAGNOSIS — N400 Enlarged prostate without lower urinary tract symptoms: Secondary | ICD-10-CM | POA: Diagnosis not present

## 2022-11-12 DIAGNOSIS — I509 Heart failure, unspecified: Secondary | ICD-10-CM

## 2022-11-12 DIAGNOSIS — Z87891 Personal history of nicotine dependence: Secondary | ICD-10-CM

## 2022-11-12 DIAGNOSIS — N189 Chronic kidney disease, unspecified: Secondary | ICD-10-CM | POA: Insufficient documentation

## 2022-11-12 DIAGNOSIS — F32A Depression, unspecified: Secondary | ICD-10-CM | POA: Diagnosis not present

## 2022-11-12 DIAGNOSIS — I110 Hypertensive heart disease with heart failure: Secondary | ICD-10-CM

## 2022-11-12 DIAGNOSIS — F419 Anxiety disorder, unspecified: Secondary | ICD-10-CM | POA: Diagnosis not present

## 2022-11-12 DIAGNOSIS — I130 Hypertensive heart and chronic kidney disease with heart failure and stage 1 through stage 4 chronic kidney disease, or unspecified chronic kidney disease: Secondary | ICD-10-CM | POA: Insufficient documentation

## 2022-11-12 DIAGNOSIS — J4489 Other specified chronic obstructive pulmonary disease: Secondary | ICD-10-CM | POA: Insufficient documentation

## 2022-11-12 DIAGNOSIS — J449 Chronic obstructive pulmonary disease, unspecified: Secondary | ICD-10-CM

## 2022-11-12 DIAGNOSIS — N132 Hydronephrosis with renal and ureteral calculous obstruction: Secondary | ICD-10-CM | POA: Diagnosis not present

## 2022-11-12 DIAGNOSIS — E1122 Type 2 diabetes mellitus with diabetic chronic kidney disease: Secondary | ICD-10-CM | POA: Insufficient documentation

## 2022-11-12 DIAGNOSIS — N529 Male erectile dysfunction, unspecified: Secondary | ICD-10-CM | POA: Diagnosis not present

## 2022-11-12 HISTORY — PX: CYSTOSCOPY/URETEROSCOPY/HOLMIUM LASER/STENT PLACEMENT: SHX6546

## 2022-11-12 SURGERY — CYSTOSCOPY/URETEROSCOPY/HOLMIUM LASER/STENT PLACEMENT
Anesthesia: General | Laterality: Right

## 2022-11-12 MED ORDER — ONDANSETRON HCL 4 MG/2ML IJ SOLN
INTRAMUSCULAR | Status: AC
  Filled 2022-11-12: qty 2

## 2022-11-12 MED ORDER — ALBUTEROL SULFATE (2.5 MG/3ML) 0.083% IN NEBU
INHALATION_SOLUTION | RESPIRATORY_TRACT | Status: AC
  Administered 2022-11-12: 2.5 mg via RESPIRATORY_TRACT
  Filled 2022-11-12: qty 3

## 2022-11-12 MED ORDER — MIDAZOLAM HCL 2 MG/2ML IJ SOLN
INTRAMUSCULAR | Status: AC
  Filled 2022-11-12: qty 2

## 2022-11-12 MED ORDER — FENTANYL CITRATE (PF) 100 MCG/2ML IJ SOLN
INTRAMUSCULAR | Status: AC
  Filled 2022-11-12: qty 2

## 2022-11-12 MED ORDER — PROPOFOL 10 MG/ML IV BOLUS
INTRAVENOUS | Status: DC | PRN
  Administered 2022-11-12: 200 mg via INTRAVENOUS

## 2022-11-12 MED ORDER — DEXAMETHASONE SODIUM PHOSPHATE 10 MG/ML IJ SOLN
INTRAMUSCULAR | Status: DC | PRN
  Administered 2022-11-12: 4 mg via INTRAVENOUS

## 2022-11-12 MED ORDER — ONDANSETRON HCL 4 MG/2ML IJ SOLN
INTRAMUSCULAR | Status: DC | PRN
  Administered 2022-11-12: 4 mg via INTRAVENOUS

## 2022-11-12 MED ORDER — IOHEXOL 300 MG/ML  SOLN
INTRAMUSCULAR | Status: DC | PRN
  Administered 2022-11-12: 6 mL

## 2022-11-12 MED ORDER — CIPROFLOXACIN IN D5W 400 MG/200ML IV SOLN
400.0000 mg | INTRAVENOUS | Status: AC
  Administered 2022-11-12: 400 mg via INTRAVENOUS
  Filled 2022-11-12: qty 200

## 2022-11-12 MED ORDER — MIDAZOLAM HCL 5 MG/5ML IJ SOLN
INTRAMUSCULAR | Status: DC | PRN
  Administered 2022-11-12: 2 mg via INTRAVENOUS

## 2022-11-12 MED ORDER — DEXAMETHASONE SODIUM PHOSPHATE 10 MG/ML IJ SOLN
INTRAMUSCULAR | Status: AC
  Filled 2022-11-12: qty 1

## 2022-11-12 MED ORDER — LIDOCAINE 2% (20 MG/ML) 5 ML SYRINGE
INTRAMUSCULAR | Status: DC | PRN
  Administered 2022-11-12: 80 mg via INTRAVENOUS

## 2022-11-12 MED ORDER — LIDOCAINE HCL (PF) 2 % IJ SOLN
INTRAMUSCULAR | Status: AC
  Filled 2022-11-12: qty 5

## 2022-11-12 MED ORDER — PROPOFOL 10 MG/ML IV BOLUS
INTRAVENOUS | Status: AC
  Filled 2022-11-12: qty 20

## 2022-11-12 MED ORDER — FENTANYL CITRATE (PF) 100 MCG/2ML IJ SOLN
INTRAMUSCULAR | Status: DC | PRN
  Administered 2022-11-12: 50 ug via INTRAVENOUS

## 2022-11-12 MED ORDER — ALBUTEROL SULFATE (2.5 MG/3ML) 0.083% IN NEBU: 2.5000 mg | INHALATION_SOLUTION | Freq: Four times a day (QID) | RESPIRATORY_TRACT | Status: DC | PRN

## 2022-11-12 MED ORDER — SODIUM CHLORIDE 0.9 % IR SOLN
Status: DC | PRN
  Administered 2022-11-12: 6000 mL via INTRAVESICAL

## 2022-11-12 MED ORDER — LACTATED RINGERS IV SOLN: INTRAVENOUS | Status: DC

## 2022-11-12 MED ORDER — ORAL CARE MOUTH RINSE: 15.0000 mL | Freq: Once | OROMUCOSAL | Status: AC

## 2022-11-12 MED ORDER — OXYCODONE-ACETAMINOPHEN 5-325 MG PO TABS: 1.0000 | ORAL_TABLET | ORAL | 0 refills | Status: DC | PRN

## 2022-11-12 MED ORDER — CHLORHEXIDINE GLUCONATE 0.12 % MT SOLN
15.0000 mL | Freq: Once | OROMUCOSAL | Status: AC
  Administered 2022-11-12: 15 mL via OROMUCOSAL

## 2022-11-12 MED ORDER — DOCUSATE SODIUM 100 MG PO CAPS: 100.0000 mg | ORAL_CAPSULE | Freq: Every day | ORAL | 0 refills | Status: DC | PRN

## 2022-11-12 MED ORDER — PHENYLEPHRINE 80 MCG/ML (10ML) SYRINGE FOR IV PUSH (FOR BLOOD PRESSURE SUPPORT)
PREFILLED_SYRINGE | INTRAVENOUS | Status: DC | PRN
  Administered 2022-11-12 (×4): 160 ug via INTRAVENOUS

## 2022-11-12 SURGICAL SUPPLY — 25 items
APL SKNCLS STERI-STRIP NONHPOA (GAUZE/BANDAGES/DRESSINGS)
BAG URO CATCHER STRL LF (MISCELLANEOUS) ×1 IMPLANT
BASKET ZERO TIP NITINOL 2.4FR (BASKET) IMPLANT
BENZOIN TINCTURE PRP APPL 2/3 (GAUZE/BANDAGES/DRESSINGS) IMPLANT
BSKT STON RTRVL ZERO TP 2.4FR (BASKET) ×1
CATH URETERAL DUAL LUMEN 10F (MISCELLANEOUS) IMPLANT
CATH URETL OPEN 5X70 (CATHETERS) ×1 IMPLANT
CLOTH BEACON ORANGE TIMEOUT ST (SAFETY) ×1 IMPLANT
DRSG TEGADERM 2-3/8X2-3/4 SM (GAUZE/BANDAGES/DRESSINGS) IMPLANT
FIBER LASER MOSES 200 DFL (Laser) IMPLANT
GLOVE BIOGEL M 7.0 STRL (GLOVE) ×1 IMPLANT
GOWN STRL REUS W/ TWL XL LVL3 (GOWN DISPOSABLE) ×1 IMPLANT
GOWN STRL REUS W/TWL XL LVL3 (GOWN DISPOSABLE) ×1
GUIDEWIRE STR DUAL SENSOR (WIRE) ×2 IMPLANT
GUIDEWIRE ZIPWRE .038 STRAIGHT (WIRE) IMPLANT
KIT TURNOVER KIT A (KITS) IMPLANT
LASER FIB FLEXIVA PULSE ID 365 (Laser) IMPLANT
MANIFOLD NEPTUNE II (INSTRUMENTS) ×1 IMPLANT
PACK CYSTO (CUSTOM PROCEDURE TRAY) ×1 IMPLANT
SHEATH DILATOR SET 8/10 (MISCELLANEOUS) IMPLANT
SHEATH NAVIGATOR HD 12/14X46 (SHEATH) IMPLANT
TRACTIP FLEXIVA PULS ID 200XHI (Laser) IMPLANT
TRACTIP FLEXIVA PULSE ID 200 (Laser)
TUBING CONNECTING 10 (TUBING) ×1 IMPLANT
TUBING UROLOGY SET (TUBING) ×1 IMPLANT

## 2022-11-12 NOTE — Discharge Instructions (Signed)
[facility] [phone] Post Ureteroscopy With or Without Stent Instructions  Definitions:  Ureter: The duct that transports urine from the kidney to the bladder. Stent:   A plastic hollow tube that is placed into the ureter, from the kidney to the bladder to prevent the ureter from swelling shut.  GENERAL INSTRUCTIONS:  Despite the fact that no skin incisions were used, the area around the ureter and bladder is raw and irritated. The stent is a foreign body which will further irritate the bladder wall. This irritation is manifested by increased frequency of urination, both day and night, and by an increase in the urge to urinate. In some, the urge to urinate is present almost always. Sometimes the urge is strong enough that you may not be able to stop yourself from urinating. The only real cure is to remove the stent and then give time for the bladder wall to heal which can't be done until the danger of the ureter swelling shut has passed, which varies.  You may see some blood in your urine while the stent is in place and a few days afterwards. Do not be alarmed, even if the urine was clear for a while. Get off your feet and drink lots of fluids until clearing occurs. If you start to pass clots or don't improve, call us.  DIET: You may return to your normal diet immediately. Because of the raw surface of your bladder, alcohol, spicy foods, acid type foods and drinks with caffeine may cause irritation or frequency and should be used in moderation. To keep your urine flowing freely and to avoid constipation, drink plenty of fluids during the day ( 8-10 glasses ). Tip: Avoid cranberry juice because it is very acidic.  ACTIVITY: Your physical activity doesn't need to be restricted. However, if you are very active, you may see some blood in your urine. We suggest that you reduce your activity under these circumstances until the bleeding has stopped.  BOWELS: It is important to  keep your bowels regular during the postoperative period. Straining with bowel movements can cause bleeding. A bowel movement every other day is reasonable. Use a mild laxative if needed, such as Milk of Magnesia 2-3 tablespoons, or 2 Dulcolax tablets. Call if you continue to have problems. If you have been taking narcotics for pain, before, during or after your surgery, you may be constipated. Take a laxative if necessary.   MEDICATION: You should resume your pre-surgery medications unless told not to. In addition you will often be given an antibiotic to prevent infection. These should be taken as prescribed until the bottles are finished unless you are having an unusual reaction to one of the drugs.  PROBLEMS YOU SHOULD REPORT TO [place]: Fevers over 100.5 Fahrenheit. Heavy bleeding, or clots ( See above notes about blood in urine ). Inability to urinate. Drug reactions ( hives, rash, nausea, vomiting, diarrhea ). Severe burning or pain with urination that is not improving.  FOLLOW-UP: You will need a follow-up appointment to monitor your progress. Call for this appointment at the number listed above. Usually the first appointment will be about three to fourteen days after your surgery.  You have a stent in place. You may remove stent by pulling on attached string on Thursday AM.

## 2022-11-12 NOTE — Anesthesia Postprocedure Evaluation (Signed)
Anesthesia Post Note  Patient: Charles Riggs.  Procedure(s) Performed: CYSTOSCOPY/RIGHT RETROGRADE PYELOGRAM/RIGHT URETEROSCOPY/HOLMIUM LASER/RIGHT STENT PLACEMENT (Right)     Patient location during evaluation: PACU Anesthesia Type: General Level of consciousness: awake and alert Pain management: pain level controlled Vital Signs Assessment: post-procedure vital signs reviewed and stable Respiratory status: spontaneous breathing, nonlabored ventilation, respiratory function stable and patient connected to nasal cannula oxygen Cardiovascular status: blood pressure returned to baseline and stable Postop Assessment: no apparent nausea or vomiting Anesthetic complications: no   No notable events documented.  Last Vitals:  Vitals:   11/12/22 1600 11/12/22 1625  BP: 127/85 (!) 143/102  Pulse: 63 67  Resp: 20 20  Temp:  (!) 36.4 C  SpO2: 96% 95%    Last Pain:  Vitals:   11/12/22 1625  TempSrc:   PainSc: 0-No pain                 Randie Bloodgood
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
## 2022-11-12 NOTE — Anesthesia Procedure Notes (Signed)
Procedure Name: LMA Insertion Date/Time: 11/12/2022 1:51 PM  Performed by: Adria Dill, CRNAPre-anesthesia Checklist: Patient identified, Emergency Drugs available, Suction available and Patient being monitored Patient Re-evaluated:Patient Re-evaluated prior to induction Oxygen Delivery Method: Circle system utilized Preoxygenation: Pre-oxygenation with 100% oxygen Induction Type: IV induction Ventilation: Mask ventilation without difficulty LMA: LMA with gastric port inserted LMA Size: 4.0 Number of attempts: 1 Placement Confirmation: positive ETCO2 and breath sounds checked- equal and bilateral Tube secured with: Tape Dental Injury: Teeth and Oropharynx as per pre-operative assessment

## 2022-11-12 NOTE — Transfer of Care (Signed)
Immediate Anesthesia Transfer of Care Note  Patient: Charles Riggs.  Procedure(s) Performed: CYSTOSCOPY/RIGHT RETROGRADE PYELOGRAM/RIGHT URETEROSCOPY/HOLMIUM LASER/RIGHT STENT PLACEMENT (Right)  Patient Location: PACU  Anesthesia Type:General  Level of Consciousness: awake, alert , and oriented  Airway & Oxygen Therapy: Patient Spontanous Breathing and Patient connected to face mask oxygen  Post-op Assessment: Report given to RN, Post -op Vital signs reviewed and stable, and Patient moving all extremities X 4  Post vital signs: Reviewed and stable  Last Vitals:  Vitals Value Taken Time  BP 133/95 11/12/22 1539  Temp    Pulse 83 11/12/22 1541  Resp 17 11/12/22 1541  SpO2 100 % 11/12/22 1541  Vitals shown include unvalidated device data.  Last Pain:  Vitals:   11/12/22 1133  TempSrc: Oral  PainSc:          Complications: No notable events documented.
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
## 2022-11-13 ENCOUNTER — Encounter (HOSPITAL_COMMUNITY): Payer: Self-pay | Admitting: Urology

## 2022-11-15 ENCOUNTER — Emergency Department (HOSPITAL_COMMUNITY): Payer: No Typology Code available for payment source

## 2022-11-15 ENCOUNTER — Emergency Department (HOSPITAL_COMMUNITY)
Admission: EM | Admit: 2022-11-15 | Discharge: 2022-11-16 | Disposition: A | Discharge status: Home / Self Care | Payer: No Typology Code available for payment source | Attending: Emergency Medicine | Admitting: Emergency Medicine

## 2022-11-15 ENCOUNTER — Other Ambulatory Visit: Payer: Self-pay

## 2022-11-15 DIAGNOSIS — I130 Hypertensive heart and chronic kidney disease with heart failure and stage 1 through stage 4 chronic kidney disease, or unspecified chronic kidney disease: Secondary | ICD-10-CM | POA: Insufficient documentation

## 2022-11-15 DIAGNOSIS — J40 Bronchitis, not specified as acute or chronic: Secondary | ICD-10-CM

## 2022-11-15 DIAGNOSIS — J209 Acute bronchitis, unspecified: Secondary | ICD-10-CM | POA: Insufficient documentation

## 2022-11-15 DIAGNOSIS — J45909 Unspecified asthma, uncomplicated: Secondary | ICD-10-CM | POA: Insufficient documentation

## 2022-11-15 DIAGNOSIS — I509 Heart failure, unspecified: Secondary | ICD-10-CM | POA: Diagnosis not present

## 2022-11-15 DIAGNOSIS — N189 Chronic kidney disease, unspecified: Secondary | ICD-10-CM | POA: Insufficient documentation

## 2022-11-15 DIAGNOSIS — R0602 Shortness of breath: Secondary | ICD-10-CM | POA: Diagnosis present

## 2022-11-15 LAB — CBC WITH DIFFERENTIAL/PLATELET
Abs Immature Granulocytes: 0.01 10*3/uL (ref 0.00–0.07)
Basophils Absolute: 0 10*3/uL (ref 0.0–0.1)
Basophils Relative: 1 %
Eosinophils Absolute: 0.4 10*3/uL (ref 0.0–0.5)
Eosinophils Relative: 8 %
HCT: 39.7 % (ref 39.0–52.0)
Hemoglobin: 12.1 g/dL — ABNORMAL LOW (ref 13.0–17.0)
Immature Granulocytes: 0 %
Lymphocytes Relative: 39 %
Lymphs Abs: 2 10*3/uL (ref 0.7–4.0)
MCH: 27.1 pg (ref 26.0–34.0)
MCHC: 30.5 g/dL (ref 30.0–36.0)
MCV: 89 fL (ref 80.0–100.0)
Monocytes Absolute: 0.5 10*3/uL (ref 0.1–1.0)
Monocytes Relative: 9 %
Neutro Abs: 2.2 10*3/uL (ref 1.7–7.7)
Neutrophils Relative %: 43 %
Platelets: 200 10*3/uL (ref 150–400)
RBC: 4.46 MIL/uL (ref 4.22–5.81)
RDW: 13.9 % (ref 11.5–15.5)
WBC: 5 10*3/uL (ref 4.0–10.5)
nRBC: 0 % (ref 0.0–0.2)

## 2022-11-15 LAB — COMPREHENSIVE METABOLIC PANEL
ALT: 17 U/L (ref 0–44)
AST: 15 U/L (ref 15–41)
Albumin: 3.8 g/dL (ref 3.5–5.0)
Alkaline Phosphatase: 83 U/L (ref 38–126)
Anion gap: 6 (ref 5–15)
BUN: 16 mg/dL (ref 8–23)
CO2: 23 mmol/L (ref 22–32)
Calcium: 8.4 mg/dL — ABNORMAL LOW (ref 8.9–10.3)
Chloride: 110 mmol/L (ref 98–111)
Creatinine, Ser: 1.18 mg/dL (ref 0.61–1.24)
GFR, Estimated: >60 mL/min (ref >60)
Glucose, Bld: 120 mg/dL — ABNORMAL HIGH (ref 70–99)
Potassium: 3.9 mmol/L (ref 3.5–5.1)
Sodium: 139 mmol/L (ref 135–145)
Total Bilirubin: 0.5 mg/dL (ref 0.3–1.2)
Total Protein: 6.7 g/dL (ref 6.5–8.1)

## 2022-11-15 MED ORDER — ALBUTEROL SULFATE HFA 108 (90 BASE) MCG/ACT IN AERS
2.0000 | INHALATION_SPRAY | RESPIRATORY_TRACT | Status: DC | PRN
  Administered 2022-11-15: 2 via RESPIRATORY_TRACT
  Filled 2022-11-15: qty 6.7

## 2022-11-15 NOTE — ED Triage Notes (Addendum)
Pt arrives with reports of shortness of breath for the last week. Pt reports having issues with fluid retention in the past and having hx of pneumonia. Pt does not currently take diuretics per PCP. Pt denies chest pain. Pt has audible wheezing in triage.

## 2022-11-16 LAB — BRAIN NATRIURETIC PEPTIDE: B Natriuretic Peptide: 64.3 pg/mL (ref 0.0–100.0)

## 2022-11-16 MED ORDER — PREDNISONE 20 MG PO TABS
40.0000 mg | ORAL_TABLET | Freq: Once | ORAL | Status: AC
  Administered 2022-11-16: 40 mg via ORAL
  Filled 2022-11-16: qty 2

## 2022-11-16 MED ORDER — AZITHROMYCIN 250 MG PO TABS
500.0000 mg | ORAL_TABLET | Freq: Once | ORAL | Status: AC
  Administered 2022-11-16: 500 mg via ORAL
  Filled 2022-11-16: qty 2

## 2022-11-16 MED ORDER — IPRATROPIUM-ALBUTEROL 0.5-2.5 (3) MG/3ML IN SOLN
3.0000 mL | Freq: Once | RESPIRATORY_TRACT | Status: AC
  Administered 2022-11-16: 3 mL via RESPIRATORY_TRACT
  Filled 2022-11-16: qty 3

## 2022-11-16 MED ORDER — PREDNISONE 20 MG PO TABS: 40.0000 mg | ORAL_TABLET | Freq: Every day | ORAL | 0 refills | Status: AC

## 2022-11-16 MED ORDER — AZITHROMYCIN 250 MG PO TABS: 250.0000 mg | ORAL_TABLET | Freq: Every day | ORAL | 0 refills | Status: DC

## 2022-11-16 NOTE — ED Notes (Signed)
Pt ambulated to the bathroom w/out any difficulties. No sob,lightheadedness, or dizziness to report.

## 2022-11-16 NOTE — ED Provider Notes (Signed)
Emergency Department Provider Note   I have reviewed the triage vital signs and the nursing notes.   HISTORY  Chief Complaint Shortness of Breath   HPI Charles Riggs. is a 67 y.o. male past history of CHF, CKD, OSA, and s/p aortic valve replacement presents to the emergency department for evaluation of shortness of breath worsening gradually over the last week.  Denies any pain in the chest.  No fevers or chills.  Notes a prior history of asthma but had been told by prior physicians that they suspect his wheezing in the past discomfort and valve disease.  He does feel better sitting up as opposed to lying flat but has not noticed swelling in his legs or unexpected weight gain.  He does not take a diuretic regularly.  He is not a smoker.  He feels like he is restricted and taking a deep breath but no specific pain with doing so.   Past Medical History:  Diagnosis Date   Anemia in chronic kidney disease    Anxiety    Aortic insufficiency    Arthritis    Asthma    CHF (congestive heart failure) (HCC)    CKD (chronic kidney disease)    Essential hypertension    Nerve pain    Non-rheumatic aortic regurgitation    Obstructive sleep apnea    cpap   Pre-diabetes    S/P minimally invasive aortic valve replacement with bioprosthetic valve 12/16/2018   25 mm Edwards Inspiris Resilia stented bovine pericardial tissue valve via right mini thoracotomy approach   SOB (shortness of breath)    with exertion   Syncope 12/2018    Review of Systems  Constitutional: No fever/chills Cardiovascular: Denies chest pain. Respiratory: Positive shortness of breath. Gastrointestinal: No abdominal pain.   Genitourinary: Negative for dysuria. Musculoskeletal: Negative for back pain. Skin: Negative for rash. Neurological: Negative for headaches.  ____________________________________________   PHYSICAL EXAM:  VITAL SIGNS: ED Triage Vitals  Enc Vitals Group     BP 11/15/22 2305 130/81      Pulse Rate 11/15/22 2305 71     Resp 11/15/22 2305 (!) 24     Temp 11/15/22 2305 98.3 F (36.8 C)     Temp src --      SpO2 11/15/22 2305 98 %     Weight 11/15/22 2302 257 lb (116.6 kg)     Height 11/15/22 2302 5\' 11"  (1.803 m)   Constitutional: Alert and oriented. Well appearing and in no acute distress. Eyes: Conjunctivae are normal. Head: Atraumatic. Nose: No congestion/rhinnorhea. Mouth/Throat: Mucous membranes are moist. Neck: No stridor.   Cardiovascular: Normal rate, regular rhythm. Good peripheral circulation. Grossly normal heart sounds.   Respiratory: Normal respiratory effort.  No retractions. Lungs with end-expiratory wheezing bilaterally.  Gastrointestinal: Soft and nontender. No distention.  Musculoskeletal: No gross deformities of extremities. Neurologic:  Normal speech and language. Skin:  Skin is warm, dry and intact. No rash noted.   ____________________________________________   LABS (all labs ordered are listed, but only abnormal results are displayed)  Labs Reviewed  CBC WITH DIFFERENTIAL/PLATELET - Abnormal; Notable for the following components:      Result Value   Hemoglobin 12.1 (*)    All other components within normal limits  COMPREHENSIVE METABOLIC PANEL - Abnormal; Notable for the following components:   Glucose, Bld 120 (*)    Calcium 8.4 (*)    All other components within normal limits  BRAIN NATRIURETIC PEPTIDE   ____________________________________________  EKG  EKG Interpretation  Date/Time:  Thursday November 15 2022 23:16:28 EDT Ventricular Rate:  70 PR Interval:  205 QRS Duration: 105 QT Interval:  416 QTC Calculation: 449 R Axis:   38 Text Interpretation: Sinus rhythm Nonspecific T abnormalities, lateral leads Confirmed by Alona Bene [phone]) on 11/16/2022 1:42:57 AM        ____________________________________________  RADIOLOGY  DG Chest 2 View  Result Date: 11/15/2022 CLINICAL DATA:  Shortness of breath EXAM:  CHEST - 2 VIEW COMPARISON:  11/18/2020 FINDINGS: Partially visualized right shoulder replacement. No acute airspace disease or effusion. Stable cardiomediastinal silhouette. Surgical plate and fixating screws across the right costosternal junction with fracture through the fixating plate as before. Valve prosthesis. IMPRESSION: No active cardiopulmonary disease. Electronically Signed   By: Jasmine Pang M.D.   On: 11/15/2022 23:28    ____________________________________________   PROCEDURES  Procedure(s) performed:   Procedures  None  ____________________________________________   INITIAL IMPRESSION / ASSESSMENT AND PLAN / ED COURSE  Pertinent labs & imaging results that were available during my care of the patient were reviewed by me and considered in my medical decision making (see chart for details).   This patient is Presenting for Evaluation of SOB, which does require a range of treatment options, and is a complaint that involves a high risk of morbidity and mortality.  The Differential Diagnoses include asthma flare, bronchitis, reactive airway disease, CHF, PE, ACS, etc.  Critical Interventions-    Medications  albuterol (VENTOLIN HFA) 108 (90 Base) MCG/ACT inhaler 2 puff (2 puffs Inhalation Given 11/15/22 2325)  ipratropium-albuterol (DUONEB) 0.5-2.5 (3) MG/3ML nebulizer solution 3 mL (3 mLs Nebulization Given 11/16/22 0131)  azithromycin (ZITHROMAX) tablet 500 mg (500 mg Oral Given 11/16/22 0342)  predniSONE (DELTASONE) tablet 40 mg (40 mg Oral Given 11/16/22 0342)    Reassessment after intervention: wheezing and SOB symptoms improved.   I decided to review pertinent External Data, and in summary last ECHO from 2022 with EF 55-60%.   Clinical Laboratory Tests Ordered, included CMP without AKI. No electrolyte disturbance. Normal CBC. No anemia.   Radiologic Tests Ordered, included CXR. I independently interpreted the images and agree with radiology interpretation.    Cardiac Monitor Tracing which shows NSR.    Social Determinants of Health Risk patient is not an active smoker.   Medical Decision Making: Summary:  Patient presents emergency department valuation of shortness of breath.  Wheezing appreciated on exam but no acute distress or hypoxemia.  Does not appear to be acutely volume overloaded on exam.  X-ray shows no pleural effusion or pulmonary edema.  Exceedingly low suspicion for PE.  Plan for BNP and DuoNeb with reassessment.  Reevaluation with update and discussion with patient. Symptoms improved. No hypoxemia or increased WOB. Stable for discharge and bronchitis mgmt.   Considered admission but no hypoxemia or other abnormality to prompt admit.   Patient's presentation is most consistent with acute presentation with potential threat to life or bodily function.   Disposition: discharge  ____________________________________________  FINAL CLINICAL IMPRESSION(S) / ED DIAGNOSES  Final diagnoses:  Bronchitis     NEW OUTPATIENT MEDICATIONS STARTED DURING THIS VISIT:  Discharge Medication List as of 11/16/2022  3:32 AM     START taking these medications   Details  azithromycin (ZITHROMAX) 250 MG tablet Take 1 tablet (250 mg total) by mouth daily. Take first 2 tablets together, then 1 every day until finished., Starting Fri 11/16/2022, Normal    predniSONE (DELTASONE) 20 MG tablet Take 2 tablets (40  mg total) by mouth daily for 4 days., Starting Sat 11/17/2022, Until Wed 11/21/2022, Normal        Note:  This document was prepared using Dragon voice recognition software and may include unintentional dictation errors.  Alona Bene, MD, [facility] Emergency Medicine    Camden Knotek, Arlyss Repress, MD 11/16/22 [phone]

## 2022-11-16 NOTE — Discharge Instructions (Addendum)
Please take your steroid and antibiotics as prescribed. You have take two puffs of your albuterol inhaler every 6 hours as needed. If your symptoms are worsening, please return to the ED for re-evaluation.

## 2022-12-21 ENCOUNTER — Other Ambulatory Visit: Payer: Self-pay | Admitting: Cardiology

## 2023-01-24 NOTE — Progress Notes (Signed)
Echo done.

## 2023-04-04 ENCOUNTER — Ambulatory Visit: Payer: Self-pay | Admitting: Cardiology

## 2023-04-09 ENCOUNTER — Encounter: Payer: Self-pay | Admitting: Cardiology

## 2023-04-09 ENCOUNTER — Ambulatory Visit: Payer: Medicare Other | Attending: Cardiology | Admitting: Cardiology

## 2023-04-09 VITALS — BP 110/72 | HR 80 | Resp 16 | Ht 71.0 in | Wt 263.0 lb

## 2023-04-09 DIAGNOSIS — I10 Essential (primary) hypertension: Secondary | ICD-10-CM | POA: Diagnosis not present

## 2023-04-09 DIAGNOSIS — Z952 Presence of prosthetic heart valve: Secondary | ICD-10-CM | POA: Diagnosis not present

## 2023-04-09 MED ORDER — LOSARTAN POTASSIUM 50 MG PO TABS: 50.0000 mg | ORAL_TABLET | Freq: Every day | ORAL | 3 refills | Status: DC

## 2023-04-09 MED ORDER — AMLODIPINE BESYLATE 5 MG PO TABS: 5.0000 mg | ORAL_TABLET | Freq: Every day | ORAL | 3 refills | Status: DC

## 2023-04-09 NOTE — Patient Instructions (Signed)
Medication Instructions:   STOP TAKING METOPROLOL TARTRATE NOW  STOP TAKING CARVEDILOL NOW  DECREASE YOUR AMLODIPINE TO 5 MG BY MOUTH DAILY  INCREASE YOUR LOSARTAN TO 50 MG BY MOUTH DAILY  *If you need a refill on your cardiac medications before your next appointment, please call your pharmacy*   Lab Work:  IN ONE WEEK HERE IN THE OFFICE--BMET  If you have labs (blood work) drawn today and your tests are completely normal, you will receive your results only by: MyChart Message (if you have MyChart) OR A paper copy in the mail If you have any lab test that is abnormal or we need to change your treatment, we will call you to review the results.   Testing/Procedures:  Your physician has requested that you have an echocardiogram. Echocardiography is a painless test that uses sound waves to create images of your heart. It provides your doctor with information about the size and shape of your heart and how well your heart's chambers and valves are working. This procedure takes approximately one hour. There are no restrictions for this procedure.  SCHEDULE ECHO TO BE DONE IN ONE YEAR PER DR. [name]  Please do NOT wear cologne, perfume, aftershave, or lotions (deodorant is allowed). Please arrive 15 minutes prior to your appointment time.  Please note: We ask at that you not bring children with you during ultrasound (echo/ vascular) testing. Due to room size and safety concerns, children are not allowed in the ultrasound rooms during exams. Our front office staff cannot provide observation of children in our lobby area while testing is being conducted. An adult accompanying a patient to their appointment will only be allowed in the ultrasound room at the discretion of the ultrasound technician under special circumstances. We apologize for any inconvenience.    Follow-Up: At [facility], you and your health needs are our priority.  As part of our continuing mission to provide  you with exceptional heart care, we have created designated Provider Care Teams.  These Care Teams include your primary Cardiologist (physician) and Advanced Practice Providers (APPs -  Physician Assistants and Nurse Practitioners) who all work together to provide you with the care you need, when you need it.  We recommend signing up for the patient portal called "MyChart".  Sign up information is provided on this After Visit Summary.  MyChart is used to connect with patients for Virtual Visits (Telemedicine).  Patients are able to view lab/test results, encounter notes, upcoming appointments, etc.  Non-urgent messages can be sent to your provider as well.   To learn more about what you can do with MyChart, go to [email or web address].    Your next appointment:   1 year(s)  Provider:   DR. Rosemary Holms

## 2023-04-09 NOTE — Progress Notes (Signed)
Cardiology Office Note:  .   Date:  04/09/2023  ID:  Charles Redden., DOB 1955-08-27, MRN [id number] PCP: [facility]  [facility] Providers Cardiologist:  Truett Mainland, MD PCP: [facility]  Chief Complaint  Patient presents with   S/P AVR (aortic valve replacement)   Follow-up    1 year      History of Present Illness: .    Charles Kostura. is a 67 y.o. male with African-American male,  48 PY former smoker, OSA, LBBB, h/o asthma, s/p bioprosthetic AVR (25 mm) through minithoracotomy (11/2018 Dr Cornelius Moras) for severe AI with quadricuspid aortic valve.   Patient is doing well.  He has no cardiac related and appears that he was not taking amlodipine or metoprolol, was taking carvedilol 6 mg twice daily, and losartan 25 again daily.  Reviewed recent lab results with the patient, details below.   Vitals:   04/09/23 0810  BP: 110/72  Pulse: 80  Resp: 16  SpO2: 94%     ROS:  Review of Systems  Cardiovascular:  Negative for chest pain, dyspnea on exertion, leg swelling, palpitations and syncope.     Studies Reviewed: Marland Kitchen        EKG 04/09/2023: Sinus rhythm with 1st degree A-V block Minimal voltage criteria for LVH, may be normal variant ( Cornell product ) Anteroseptal infarct , age undetermined When compared with ECG of 15-Nov-2022 23:16, No significant change since    Labs 10/2022: A1C 6.2% No significant abnormality  Echocardiogram 10/07/2020: Normal LV systolic function with visual EF 55-60%. Left ventricle cavity is small. Moderate concentric hypertrophy of the left ventricle. Normal global wall motion. Normal diastolic filling pattern. No aortic valve regurgitation. Mild aortic valve leaflet calcification. Mildly restricted aortic valve leaflets. Trace to mild aortic valve stenosis. Peak velocity 2.11 m/s, Peak Gradient 18 mmHg, Mean Gradient   9.3 mmHg, AVA 1.7cm. Compared to 07/14/2018, severe aortic regurgitation not present in  the study (images personally reviewed to confirm).      Physical Exam:   Physical Exam Vitals and nursing note reviewed.  Constitutional:      General: He is not in acute distress. Neck:     Vascular: No JVD.  Cardiovascular:     Rate and Rhythm: Normal rate and regular rhythm.     Heart sounds: Murmur heard.     Harsh midsystolic murmur is present with a grade of 1/6 at the upper right sternal border radiating to the neck.  Pulmonary:     Effort: Pulmonary effort is normal.     Breath sounds: Normal breath sounds. No wheezing or rales.  Musculoskeletal:     Right lower leg: No edema.     Left lower leg: No edema.      VISIT DIAGNOSES:   ICD-10-CM   1. S/P AVR (aortic valve replacement)  Z95.2 EKG 12-Lead    2. Essential hypertension  I10        ASSESSMENT AND PLAN: .    Charles Abler. is a 67 y.o. male with African-American male,  69 PY former smoker, OSA, LBBB, h/o asthma, s/p bioprosthetic AVR (25 mm) through minithoracotomy (11/2018 Dr Cornelius Moras) for severe AI with quadricuspid aortic valve.    S/p AVR: 25 mm bioprosthetic valve for quadricuspid valve with severe AI Normal functioning on echocardiogram 09/2020. Continue Aspirin 81 mg daily.  Check echocardiogram in 1 year.  Hypertension: Well-controlled. I will make the following changes to streamline his medications, and  reduce the number of pills he has to take.   Stop carvedilol. Start amlodipine 5 mg daily. Increase losartan from 25 mg daily to 50 mg daily. Check BMP in 1 week. Patient will check blood pressure at home and inform us if SBP consistently runs >140 mmHg, or <100 mmHg.         Meds ordered this encounter  Medications   losartan (COZAAR) 50 MG tablet    Sig: Take 1 tablet (50 mg total) by mouth daily.    Dispense:  90 tablet    Refill:  3    Dose increase   amLODipine (NORVASC) 5 MG tablet    Sig: Take 1 tablet (5 mg total) by mouth daily.    Dispense:  90 tablet    Refill:  3     Dose decrease     F/u in 1 year  Signed, Elder Negus, MD

## 2023-04-17 LAB — BASIC METABOLIC PANEL
BUN/Creatinine Ratio: 11 (ref 10–24)
BUN: 15 mg/dL (ref 8–27)
CO2: 22 mmol/L (ref 20–29)
Calcium: 9.4 mg/dL (ref 8.6–10.2)
Chloride: 107 mmol/L — ABNORMAL HIGH (ref 96–106)
Creatinine, Ser: 1.31 mg/dL — ABNORMAL HIGH (ref 0.76–1.27)
Glucose: 105 mg/dL — ABNORMAL HIGH (ref 70–99)
Potassium: 4.3 mmol/L (ref 3.5–5.2)
Sodium: 141 mmol/L (ref 134–144)
eGFR: 60 mL/min/{1.73_m2} (ref >59)

## 2023-04-22 ENCOUNTER — Telehealth: Payer: Self-pay

## 2023-04-22 NOTE — Telephone Encounter (Signed)
[facility] there in January.   Referral for 05/17/23 at our office Denied.

## 2023-04-30 ENCOUNTER — Ambulatory Visit: Payer: Non-veteran care | Admitting: Neurology

## 2023-10-07 ENCOUNTER — Other Ambulatory Visit: Payer: Self-pay | Admitting: Neurology

## 2023-10-07 ENCOUNTER — Telehealth: Payer: Self-pay | Admitting: Neurology

## 2023-10-07 MED ORDER — UBRELVY 100 MG PO TABS: 1.0000 | ORAL_TABLET | ORAL | 2 refills | Status: DC | PRN

## 2023-10-07 NOTE — Telephone Encounter (Signed)
 done

## 2023-10-07 NOTE — Telephone Encounter (Signed)
 Pt. Needs refill of RX Ubrelvy  until appt 12/12/23 [facility] [place] [facility]

## 2023-10-09 ENCOUNTER — Telehealth: Payer: Self-pay

## 2023-10-09 NOTE — Telephone Encounter (Signed)
*  [facility]  Pharmacy Patient Advocate Encounter   Received notification from CoverMyMeds that prior authorization for Ubrelvy  100MG  tablets  is required/requested.   Insurance verification completed.   The patient is insured through [facility] .   Per test claim: PA required; PA submitted to above mentioned insurance via CoverMyMeds Key/confirmation #/EOC [id number] Status is pending

## 2023-10-11 NOTE — Telephone Encounter (Signed)
 Request Reference Number: [id number]. UBRELVY  TAB 100MG  is approved through 05/27/2024. Your patient may now fill this prescription and it will be covered. Effective Date: 10/09/2023 Authorization Expiration Date: 05/27/2024

## 2023-12-11 ENCOUNTER — Encounter: Payer: Self-pay | Admitting: Podiatry

## 2023-12-11 ENCOUNTER — Ambulatory Visit: Admitting: Podiatry

## 2023-12-11 DIAGNOSIS — Q666 Other congenital valgus deformities of feet: Secondary | ICD-10-CM | POA: Diagnosis not present

## 2023-12-11 DIAGNOSIS — M722 Plantar fascial fibromatosis: Secondary | ICD-10-CM | POA: Diagnosis not present
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
## 2023-12-11 NOTE — Progress Notes (Signed)
 Subjective:  Patient ID: Charles Riggs., male    DOB: 31-Aug-1955,  MRN: [id number]  Chief Complaint  Patient presents with   Foot Pain    68 y.o. male presents with the above complaint.  Patient presents with bilateral flatfoot deformity with underlying plantar fasciitis he states that has been present for quite some time.  He was in [employer].  He states that his flatfeet been present since then.  It had actually gotten worse while in the service.  He denies any other acute complaints.  Would like to discuss treatment options for it.   Review of Systems: Negative except as noted in the HPI. Denies N/V/F/Ch.  Past Medical History:  Diagnosis Date   Anemia in chronic kidney disease    Anxiety    Aortic insufficiency    Arthritis    Asthma    CHF (congestive heart failure) (HCC)    CKD (chronic kidney disease)    Essential hypertension    Nerve pain    Non-rheumatic aortic regurgitation    Obstructive sleep apnea    cpap   Pre-diabetes    S/P minimally invasive aortic valve replacement with bioprosthetic valve 12/16/2018   25 mm Edwards Inspiris Resilia stented bovine pericardial tissue valve via right mini thoracotomy approach   SOB (shortness of breath)    with exertion   Syncope 12/2018    Current Outpatient Medications:    albuterol  (PROVENTIL  HFA;VENTOLIN  HFA) 108 (90 Base) MCG/ACT inhaler, Inhale 2 puffs into the lungs every 6 (six) hours as needed for wheezing or shortness of breath., Disp: , Rfl:    albuterol  (PROVENTIL ) (2.5 MG/3ML) 0.083% nebulizer solution, Take 3 mLs (2.5 mg total) by nebulization every 6 (six) hours as needed for wheezing or shortness of breath., Disp: 75 mL, Rfl: 12   amLODipine  (NORVASC ) 5 MG tablet, Take 1 tablet (5 mg total) by mouth daily., Disp: 90 tablet, Rfl: 3   aspirin  EC 81 MG tablet, Take 1 tablet (81 mg total) by mouth daily., Disp: 90 tablet, Rfl: 1   Atogepant  (QULIPTA ) 60 MG TABS, Take 60 mg by mouth daily. (Patient  taking differently: Take 60 mg by mouth daily as needed (migraines).), Disp: 30 tablet, Rfl: 5   azithromycin  (ZITHROMAX ) 250 MG tablet, Take 1 tablet (250 mg total) by mouth daily. Take first 2 tablets together, then 1 every day until finished., Disp: 6 tablet, Rfl: 0   baclofen (LIORESAL) 10 MG tablet, Take 10 mg by mouth at bedtime as needed for muscle spasms., Disp: , Rfl:    budesonide -formoterol  (SYMBICORT ) 160-4.5 MCG/ACT inhaler, Inhale 2 puffs into the lungs 2 (two) times daily., Disp: , Rfl:    celecoxib (CELEBREX) 100 MG capsule, Take 100 mg by mouth daily as needed for moderate pain., Disp: , Rfl:    Cholecalciferol (VITAMIN D3) 50 MCG (2000 UT) TABS, Take 2,000 Units by mouth 2 (two) times daily., Disp: , Rfl:    docusate sodium  (COLACE) 100 MG capsule, Take 1 capsule (100 mg total) by mouth daily as needed for up to 30 doses., Disp: 30 capsule, Rfl: 0   empagliflozin (JARDIANCE) 25 MG TABS tablet, Take 25 mg by mouth daily., Disp: , Rfl:    ferrous sulfate 325 (65 FE) MG tablet, Take 325 mg by mouth daily with breakfast. (Patient not taking: Reported on 04/09/2023), Disp: , Rfl:    fluticasone  (FLONASE ) 50 MCG/ACT nasal spray, Place 2 sprays into both nostrils daily as needed for allergies or rhinitis. (Patient  not taking: Reported on 04/09/2023), Disp: , Rfl:    Fluticasone -Salmeterol (ADVAIR) 250-50 MCG/DOSE AEPB, Inhale 1 puff into the lungs 2 (two) times daily as needed (shortness of breath). (Patient not taking: Reported on 04/09/2023), Disp: , Rfl:    guaiFENesin  (MUCINEX ) 600 MG 12 hr tablet, Take 1,200 mg by mouth 2 (two) times daily., Disp: , Rfl:    lidocaine  (LIDODERM ) 5 %, Place 1 patch onto the skin daily. Remove & Discard patch within 12 hours or as directed by MD (Patient taking differently: Place 1 patch onto the skin daily as needed (pain). Remove & Discard patch within 12 hours or as directed by MD), Disp: 30 patch, Rfl: 0   losartan  (COZAAR ) 50 MG tablet, Take 1 tablet  (50 mg total) by mouth daily., Disp: 90 tablet, Rfl: 3   metFORMIN (GLUCOPHAGE) 500 MG tablet, Take 500 mg by mouth 2 (two) times daily with a meal., Disp: , Rfl:    methocarbamol  (ROBAXIN ) 750 MG tablet, Take 750 mg by mouth every 8 (eight) hours as needed. (Patient not taking: Reported on 04/09/2023), Disp: , Rfl:    montelukast  (SINGULAIR ) 10 MG tablet, Take 10 mg by mouth daily., Disp: , Rfl:    omeprazole  (PRILOSEC) 40 MG capsule, Take 40 mg by mouth daily as needed (acid reflux)., Disp: , Rfl:    ondansetron  (ZOFRAN ) 4 MG tablet, Take 1 tablet (4 mg total) by mouth every 6 (six) hours. (Patient taking differently: Take 4 mg by mouth every 8 (eight) hours as needed for nausea or vomiting.), Disp: 15 tablet, Rfl: 0   oxyCODONE -acetaminophen  (PERCOCET) 5-325 MG tablet, Take 1 tablet by mouth every 4 (four) hours as needed for up to 18 doses for severe pain., Disp: 18 tablet, Rfl: 0   polycarbophil (FIBERCON) 625 MG tablet, Take 625 mg by mouth in the morning and at bedtime., Disp: , Rfl:    rosuvastatin  (CRESTOR ) 20 MG tablet, Take 10 mg by mouth at bedtime., Disp: , Rfl:    sertraline  (ZOLOFT ) 100 MG tablet, Take 100 mg by mouth daily., Disp: , Rfl:    tamsulosin  (FLOMAX ) 0.4 MG CAPS capsule, Take 1 capsule (0.4 mg total) by mouth daily. (Patient taking differently: Take 0.4 mg by mouth daily as needed.), Disp: 30 capsule, Rfl: 0   terbinafine (LAMISIL) 1 % cream, Apply 1 Application topically daily as needed (irritation)., Disp: , Rfl:    Ubrogepant  (UBRELVY ) 100 MG TABS, Take 1 tablet (100 mg total) by mouth as needed (May repeat in 2 hours if needed.  Maximum 2 tablets in 24 hours.)., Disp: 10 tablet, Rfl: 2  Social History   Tobacco Use  Smoking Status Former   Current packs/day: 0.00   Average packs/day: 1 pack/day for 7.0 years (7.0 ttl pk-yrs)   Types: Cigarettes   Start date: 2004   Quit date: 2011   Years since quitting: 14.5  Smokeless Tobacco Never  Tobacco Comments   30 PY     No Known Allergies Objective:  There were no vitals filed for this visit. There is no height or weight on file to calculate BMI. Constitutional Well developed. Well nourished.  Vascular Dorsalis pedis pulses palpable bilaterally. Posterior tibial pulses palpable bilaterally. Capillary refill normal to all digits.  No cyanosis or clubbing noted. Pedal hair growth normal.  Neurologic Normal speech. Oriented to person, place, and time. Epicritic sensation to light touch grossly present bilaterally.  Dermatologic Nails well groomed and normal in appearance. No open wounds. No skin lesions.  Orthopedic: Pain on palpation bilateral plantar fasciitis pain at the calcaneal tuber.  Positive pes planovalgus foot structure noted with calcaneovalgus to many toe signs unable to put create an arch with dorsiflexion of the toes.  Unable to perform single and double heel raise   Radiographs: None Assessment:   1. Plantar fasciitis, right   2. Plantar fasciitis, left   3. Pes planovalgus    Plan:  Patient was evaluated and treated and all questions answered.  Bilateral plantar fasciitis with underlying pes planovalgus foot deformity - All questions or concerns were discussed with the patient in extensive detail. - Given the amount of pain that he is experiencing benefit from bilateral plantar fascial brace.  He does not want to do any injection at this time. - patient this may have happened during [employer] service as he was flat-footed with arch and heel pain.  No follow-ups on file.

## 2023-12-12 ENCOUNTER — Ambulatory Visit: Admitting: Neurology

## 2023-12-12 ENCOUNTER — Encounter: Payer: Self-pay | Admitting: Neurology

## 2023-12-12 VITALS — BP 90/54 | HR 50 | Ht 71.0 in | Wt 259.0 lb

## 2023-12-12 DIAGNOSIS — M722 Plantar fascial fibromatosis: Secondary | ICD-10-CM

## 2023-12-12 DIAGNOSIS — G4733 Obstructive sleep apnea (adult) (pediatric): Secondary | ICD-10-CM | POA: Diagnosis not present

## 2023-12-12 DIAGNOSIS — G43009 Migraine without aura, not intractable, without status migrainosus: Secondary | ICD-10-CM | POA: Diagnosis not present

## 2023-12-12 DIAGNOSIS — M4802 Spinal stenosis, cervical region: Secondary | ICD-10-CM | POA: Diagnosis not present

## 2023-12-12 MED ORDER — QULIPTA 60 MG PO TABS: 60.0000 mg | ORAL_TABLET | Freq: Every day | ORAL | 11 refills | Status: AC

## 2023-12-12 MED ORDER — UBRELVY 100 MG PO TABS: 1.0000 | ORAL_TABLET | ORAL | 11 refills | Status: AC | PRN

## 2023-12-12 NOTE — Patient Instructions (Signed)
 Refilled Ubrelvy  and Qulipta 

## 2023-12-22 ENCOUNTER — Other Ambulatory Visit: Payer: Self-pay | Admitting: Cardiology

## 2024-01-23 ENCOUNTER — Telehealth: Payer: Self-pay | Admitting: Podiatry

## 2024-01-23 NOTE — Telephone Encounter (Signed)
 cld pt bck from mess he lft on my vmail. He will have Franky Nightingale call me to adv wht type letter [facility] is needing for him. See other letter they said is not valid/?? See prev notes

## 2024-01-28 ENCOUNTER — Telehealth: Payer: Self-pay | Admitting: Podiatry

## 2024-01-28 NOTE — Telephone Encounter (Signed)
 lft mess on pt's vmail to adv he can drop of what is needed in letter he needs. see prev notes

## 2024-01-31 ENCOUNTER — Telehealth: Payer: Self-pay | Admitting: Podiatry

## 2024-01-31 NOTE — Telephone Encounter (Signed)
 I sent mess to Dr. Tobie to confirm he is ok with inserting the letter from July into the [employer] form [facility] gave the pt for us  to complete.

## 2024-02-03 ENCOUNTER — Telehealth: Payer: Self-pay | Admitting: Podiatry

## 2024-02-03 ENCOUNTER — Encounter: Payer: Self-pay | Admitting: Podiatry

## 2024-02-03 ENCOUNTER — Other Ambulatory Visit: Payer: Self-pay | Admitting: Cardiology

## 2024-02-03 NOTE — Telephone Encounter (Signed)
 Composed letter and sent to Dr. Tobie for review. [facility] needs to determine assistance for the pt Proof of his time in service caused his diagnosis.

## 2024-02-04 ENCOUNTER — Telehealth: Payer: Self-pay | Admitting: Podiatry

## 2024-02-04 NOTE — Telephone Encounter (Signed)
 Dr. Tobie ok'd the letter and I send MyChart mess to pt to adv the letter is ready for pick and provided a copy of the letter for his records.

## 2024-02-05 ENCOUNTER — Telehealth: Payer: Self-pay | Admitting: Podiatry

## 2024-02-05 NOTE — Telephone Encounter (Signed)
 pt checking on letter-lft mess. I cld him and he said he came to pick up already

## 2024-02-24 ENCOUNTER — Telehealth: Payer: Self-pay

## 2024-02-24 ENCOUNTER — Telehealth: Payer: Self-pay | Admitting: Cardiology

## 2024-02-24 ENCOUNTER — Other Ambulatory Visit: Payer: Self-pay | Admitting: Urology

## 2024-02-24 NOTE — Telephone Encounter (Signed)
 Primary Cardiologist:Charles JINNY Lawrence, MD   Preoperative team, please contact this patient and set up a phone call appointment for further preoperative risk assessment. Please obtain consent and complete medication review. Thank you for your help.   I confirm that guidance regarding antiplatelet and oral anticoagulation therapy has been completed and, if necessary, noted below.  Per office protocol and pending no concerning cardiac symptoms, he may hold aspirin  for 7 days prior to procedure and should resume as soon as hemodynamically stable postoperatively.  I also confirmed the patient resides in the state of [place] . As per [facility] Board telemedicine laws, the patient must reside in the state in which the provider is licensed.   [name], NP-C  02/24/2024, 3:19 PM [address], [address] [place], [place] [address] Office [phone] Fax [phone]

## 2024-02-24 NOTE — Telephone Encounter (Signed)
  Patient Consent for Virtual Visit        Charles Riggs. has provided verbal consent on 02/24/2024 for a virtual visit (video or telephone).   CONSENT FOR VIRTUAL VISIT FOR:  Charles Riggs.  By participating in this virtual visit I agree to the following:  I hereby voluntarily request, consent and authorize [facility] and its employed or contracted physicians, physician assistants, nurse practitioners or other licensed health care professionals (the Practitioner), to provide me with telemedicine health care services (the "Services) as deemed necessary by the treating Practitioner. I acknowledge and consent to receive the Services by the Practitioner via telemedicine. I understand that the telemedicine visit will involve communicating with the Practitioner through live audiovisual communication technology and the disclosure of certain medical information by electronic transmission. I acknowledge that I have been given the opportunity to request an in-person assessment or other available alternative prior to the telemedicine visit and am voluntarily participating in the telemedicine visit.  I understand that I have the right to withhold or withdraw my consent to the use of telemedicine in the course of my care at any time, without affecting my right to future care or treatment, and that the Practitioner or I may terminate the telemedicine visit at any time. I understand that I have the right to inspect all information obtained and/or recorded in the course of the telemedicine visit and may receive copies of available information for a reasonable fee.  I understand that some of the potential risks of receiving the Services via telemedicine include:  Delay or interruption in medical evaluation due to technological equipment failure or disruption; Information transmitted may not be sufficient (e.g. poor resolution of images) to allow for appropriate medical decision making by the  Practitioner; and/or  In rare instances, security protocols could fail, causing a breach of personal health information.  Furthermore, I acknowledge that it is my responsibility to provide information about my medical history, conditions and care that is complete and accurate to the best of my ability. I acknowledge that Practitioner's advice, recommendations, and/or decision may be based on factors not within their control, such as incomplete or inaccurate data provided by me or distortions of diagnostic images or specimens that may result from electronic transmissions. I understand that the practice of medicine is not an exact science and that Practitioner makes no warranties or guarantees regarding treatment outcomes. I acknowledge that a copy of this consent can be made available to me via my patient portal [facility] MyChart), or I can request a printed copy by calling the office of [facility].    I understand that my insurance will be billed for this visit.   I have read or had this consent read to me. I understand the contents of this consent, which adequately explains the benefits and risks of the Services being provided via telemedicine.  I have been provided ample opportunity to ask questions regarding this consent and the Services and have had my questions answered to my satisfaction. I give my informed consent for the services to be provided through the use of telemedicine in my medical care

## 2024-02-24 NOTE — Telephone Encounter (Signed)
   Pre-operative Risk Assessment    Patient Name: Charles Riggs.  DOB: 12/11/1955 MRN: [id number]   Date of last office visit: 04/09/2023 Date of next office visit: TBD   Request for Surgical Clearance    Procedure:  Cystoscopy bilateral retrograde pyelogram left ureteroscopy laser lithotripsy bilateral stent     Date of Surgery:  Clearance 03/09/24                                 Surgeon:  Dr. Donnice Siad  Surgeon's Group or Practice Name:  [facility]  Phone number:  [phone] Ext: [phone] Fax number:  [phone]   Type of Clearance Requested:   - Medical  - Pharmacy:  Hold Aspirin  5 days prior    Type of Anesthesia:  General    Additional requests/questions:  Pt will be having this surgery again two weeks later on the right side.   Signed, Bernarda JONETTA Fireman   02/24/2024, 1:59 PM

## 2024-02-24 NOTE — Telephone Encounter (Signed)
 Preop tele appt now scheduled, med rec and consent done.

## 2024-02-26 NOTE — Patient Instructions (Signed)
 SURGICAL WAITING ROOM VISITATION  Patients having surgery or a procedure may have no more than 2 support people in the waiting area - these visitors may rotate.    Children under the age of 13 must have an adult with them who is not the patient.  Visitors with respiratory illnesses are discouraged from visiting and should remain at home.  If the patient needs to stay at the hospital during part of their recovery, the visitor guidelines for inpatient rooms apply. Pre-op nurse will coordinate an appropriate time for 1 support person to accompany patient in pre-op.  This support person may not rotate.    Please refer to the [facility] website for the visitor guidelines for Inpatients (after your surgery is over and you are in a regular room).       Your procedure is scheduled on:  03/09/2024   Report to [facility] Main Entrance    Report to admitting at   1130AM   Call this number if you have problems the morning of surgery [phone]   Do not eat food :After Midnight.   After Midnight you may have the following liquids until  1030______ AM DAY OF SURGERY  Water  Non-Citrus Juices (without pulp, NO RED-Apple, White grape, White cranberry) Black Coffee (NO MILK/CREAM OR CREAMERS, sugar ok)  Clear Tea (NO MILK/CREAM OR CREAMERS, sugar ok) regular and decaf                             Plain Jell-O (NO RED)                                           Fruit ices (not with fruit pulp, NO RED)                                     Popsicles (NO RED)                                                               Sports drinks like Gatorade (NO RED)                             If you have questions, please contact your surgeon's office.      Oral Hygiene is also important to reduce your risk of infection.                                    Remember - BRUSH YOUR TEETH THE MORNING OF SURGERY WITH YOUR REGULAR TOOTHPASTE  DENTURES WILL BE REMOVED PRIOR TO SURGERY PLEASE DO NOT  APPLY Poly grip OR ADHESIVES!!!   Do NOT smoke after Midnight   Stop all vitamins and herbal supplements 7 days before surgery.   Take these medicines the morning of surgery with A SIP OF WATER :  inhalers as usual and bring nebulzier if needed, amlodipien, omeprazole  if needed. Zoloft    DO NOT TAKE ANY ORAL DIABETIC MEDICATIONS  DAY OF YOUR SURGERY  Bring CPAP mask and tubing day of surgery.                              You may not have any metal on your body including hair pins, jewelry, and body piercing             Do not wear make-up, lotions, powders, perfumes/cologne, or deodorant  Do not wear nail polish including gel and S&S, artificial/acrylic nails, or any other type of covering on natural nails including finger and toenails. If you have artificial nails, gel coating, etc. that needs to be removed by a nail salon please have this removed prior to surgery or surgery may need to be canceled/ delayed if the surgeon/ anesthesia feels like they are unable to be safely monitored.   Do not shave  48 hours prior to surgery.               Men may shave face and neck.   Do not bring valuables to the hospital. [facility] IS NOT             RESPONSIBLE   FOR VALUABLES.   Contacts, glasses, dentures or bridgework may not be worn into surgery.   Bring small overnight bag day of surgery.   DO NOT BRING YOUR HOME MEDICATIONS TO THE HOSPITAL. PHARMACY WILL DISPENSE MEDICATIONS LISTED ON YOUR MEDICATION LIST TO YOU DURING YOUR ADMISSION IN THE HOSPITAL!    Patients discharged on the day of surgery will not be allowed to drive home.  Someone NEEDS to stay with you for the first 24 hours after anesthesia.   Special Instructions: Bring a copy of your healthcare power of attorney and living will documents the day of surgery if you haven't scanned them before.              Please read over the following fact sheets you were given: IF YOU HAVE QUESTIONS ABOUT YOUR PRE-OP INSTRUCTIONS PLEASE  CALL [phone].   If you received a COVID test during your pre-op visit  it is requested that you wear a mask when out in public, stay away from anyone that may not be feeling well and notify your surgeon if you develop symptoms. If you test positive for Covid or have been in contact with anyone that has tested positive in the last 10 days please notify you surgeon.    [facility] - Preparing for Surgery Before surgery, you can play an important role.  Because skin is not sterile, your skin needs to be as free of germs as possible.  You can reduce the number of germs on your skin by washing with CHG (chlorahexidine gluconate) soap before surgery.  CHG is an antiseptic cleaner which kills germs and bonds with the skin to continue killing germs even after washing. Please DO NOT use if you have an allergy to CHG or antibacterial soaps.  If your skin becomes reddened/irritated stop using the CHG and inform your nurse when you arrive at Short Stay. Do not shave (including legs and underarms) for at least 48 hours prior to the first CHG shower.  You may shave your face/neck. Please follow these instructions carefully:  1.  Shower with CHG Soap the night before surgery and the  morning of Surgery.  2.  If you choose to wash your hair, wash your hair first as usual with your  normal  shampoo.  3.  After you shampoo, rinse your hair and body thoroughly to remove the  shampoo.                           4.  Use CHG as you would any other liquid soap.  You can apply chg directly  to the skin and wash                       Gently with a scrungie or clean washcloth.  5.  Apply the CHG Soap to your body ONLY FROM THE NECK DOWN.   Do not use on face/ open                           Wound or open sores. Avoid contact with eyes, ears mouth and genitals (private parts).                       Wash face,  Genitals (private parts) with your normal soap.             6.  Wash thoroughly, paying special attention to the area  where your surgery  will be performed.  7.  Thoroughly rinse your body with warm water  from the neck down.  8.  DO NOT shower/wash with your normal soap after using and rinsing off  the CHG Soap.                9.  Pat yourself dry with a clean towel.            10.  Wear clean pajamas.            11.  Place clean sheets on your bed the night of your first shower and do not  sleep with pets. Day of Surgery : Do not apply any lotions/deodorants the morning of surgery.  Please wear clean clothes to the hospital/surgery center.  FAILURE TO FOLLOW THESE INSTRUCTIONS MAY RESULT IN THE CANCELLATION OF YOUR SURGERY PATIENT SIGNATURE_________________________________  NURSE SIGNATURE__________________________________  ________________________________________________________________________

## 2024-02-26 NOTE — Progress Notes (Signed)
 Anesthesia Review:  PCP: Cardiologist : patwardhan - LVO 04/09/23  Neuro- Skeet LVO 12/12/23   PPM/ ICD: Device Orders: Rep Notified:  Chest x-ray : EKG : 04/09/2023  Echo : 2022  Stress test: Cardiac Cath :   Activity level:  Sleep Study/ CPAP : Fasting Blood Sugar :      / Checks Blood Sugar -- times a day:   Prediabetes-  Jardiance-  hodl for 72 hours last dose on 03/05/2024  Semaglutide- last dose on   Blood Thinner/ Instructions /Last Dose: ASA / Instructions/ Last Dose :  81 mg aspirin 

## 2024-02-28 ENCOUNTER — Encounter (HOSPITAL_COMMUNITY): Payer: Self-pay

## 2024-02-28 ENCOUNTER — Encounter (HOSPITAL_COMMUNITY)
Admission: RE | Admit: 2024-02-28 | Discharge: 2024-02-28 | Disposition: A | Discharge status: Home / Self Care | Source: Ambulatory Visit | Attending: Urology | Admitting: Urology

## 2024-02-28 ENCOUNTER — Ambulatory Visit: Attending: Cardiology

## 2024-02-28 ENCOUNTER — Other Ambulatory Visit: Payer: Self-pay

## 2024-02-28 VITALS — Ht 71.0 in | Wt 250.0 lb

## 2024-02-28 DIAGNOSIS — Z01810 Encounter for preprocedural cardiovascular examination: Secondary | ICD-10-CM | POA: Diagnosis not present

## 2024-02-28 DIAGNOSIS — Z01818 Encounter for other preprocedural examination: Secondary | ICD-10-CM

## 2024-02-28 HISTORY — DX: Personal history of urinary calculi: Z87.442

## 2024-02-28 NOTE — Progress Notes (Signed)
 Virtual Visit via Telephone Note   Because of Charles Riggs. co-morbid illnesses, he is at least at moderate risk for complications without adequate follow up.  This format is felt to be most appropriate for this patient at this time.  Due to technical limitations with video connection (technology), today's appointment will be conducted as an audio only telehealth visit, and Charles Riggs. verbally agreed to proceed in this manner.   All issues noted in this document were discussed and addressed.  No physical exam could be performed with this format.  Evaluation Performed:  Preoperative cardiovascular risk assessment _____________   Date:  02/28/2024   Patient ID:  Charles Riggs., DOB 08/15/1955, MRN [id number] Patient Location:  Home Provider location:   Office  Primary Care Provider:  [facility] Primary Cardiologist:  Charles JINNY Lawrence, MD  Chief Complaint / Patient Profile   67 y.o. y/o male with a h/o , OSA, LBBB, h/o asthma, s/p bioprosthetic AVR (25 mm) through minithoracotomy (11/2018 Dr Dusty) for severe AI with quadricuspid aortic valve  who is pending cystoscopy with bilateral left uteroscopy and laser lithotripsy who presents today for telephonic preoperative cardiovascular risk assessment.  History of Present Illness    Charles Riggs. is a 68 y.o. male who presents via audio/video conferencing for a telehealth visit today.  Pt was last seen in cardiology clinic on 04/08/2023 by Dr. Lawrence. At that time Charles Riggs. was doing well with no new cardiac complaints. The patient is now pending procedure as outlined above. Since his last visit, he has been doing well with no new cardiac complaints.  He is active and works out at [employer] without any difficulties.  He denies chest pain, shortness of breath, lower extremity edema, fatigue, palpitations, melena, hematuria, hemoptysis, diaphoresis, weakness, presyncope, syncope, orthopnea, and PND.    Past Medical History    Past Medical History:  Diagnosis Date   Anemia in chronic kidney disease    Anxiety    Aortic insufficiency    Arthritis    Asthma    CHF (congestive heart failure) (HCC)    CKD (chronic kidney disease)    Essential hypertension    Nerve pain    Non-rheumatic aortic regurgitation    Obstructive sleep apnea    cpap   Pre-diabetes    S/P minimally invasive aortic valve replacement with bioprosthetic valve 12/16/2018   25 mm Edwards Inspiris Resilia stented bovine pericardial tissue valve via right mini thoracotomy approach   SOB (shortness of breath)    with exertion   Syncope 12/2018   Past Surgical History:  Procedure Laterality Date   AORTIC VALVE REPLACEMENT N/A 12/16/2018   Procedure: MINIMALLY INVASIVE AORTIC VALVE REPLACEMENT (AVR) using Inspiris Aortic valve 25mm.;  Surgeon: Dusty Sudie DEL, MD;  Location: [facility] OR;  Service: Open Heart Surgery;  Laterality: N/A;   BACK SURGERY  2023   for stenosis   CYSTOSCOPY/URETEROSCOPY/HOLMIUM LASER/STENT PLACEMENT Right 11/12/2022   Procedure: CYSTOSCOPY/RIGHT RETROGRADE PYELOGRAM/RIGHT URETEROSCOPY/HOLMIUM LASER/RIGHT STENT PLACEMENT;  Surgeon: Selma Donnice SAUNDERS, MD;  Location: [facility] ORS;  Service: Urology;  Laterality: Right;   HERNIA REPAIR     age 44   IR THORACENTESIS ASP PLEURAL SPACE W/IMG GUIDE  12/29/2018   REVERSE SHOULDER ARTHROPLASTY Right 06/01/2021   Procedure: REVERSE SHOULDER ARTHROPLASTY;  Surgeon: Dozier Soulier, MD;  Location: [facility] ORS;  Service: Orthopedics;  Laterality: Right;   RIB PLATING  12/16/2018   Procedure: Rib Plating;  Surgeon: Dusty Sudie DEL, MD;  Location: [facility] OR;  Service: Open Heart Surgery;;   RIGHT/LEFT HEART CATH AND CORONARY ANGIOGRAPHY N/A 10/14/2018   Procedure: RIGHT/LEFT HEART CATH AND CORONARY ANGIOGRAPHY;  Surgeon: Elmira Charles PARAS, MD;  Location: [facility] INVASIVE CV LAB;  Service: Cardiovascular;  Laterality: N/A;   TEE WITHOUT CARDIOVERSION N/A 11/11/2018    Procedure: TRANSESOPHAGEAL ECHOCARDIOGRAM (TEE);  Surgeon: Elmira Charles PARAS, MD;  Location: [facility] ENDOSCOPY;  Service: Cardiovascular;  Laterality: N/A;   TEE WITHOUT CARDIOVERSION N/A 12/16/2018   Procedure: TRANSESOPHAGEAL ECHOCARDIOGRAM (TEE);  Surgeon: Dusty Sudie DEL, MD;  Location: [facility] OR;  Service: Open Heart Surgery;  Laterality: N/A;   TOTAL HIP ARTHROPLASTY Left 03/09/2019   Procedure: LEFT TOTAL HIP ARTHROPLASTY ANTERIOR APPROACH;  Surgeon: Jerri Kay HERO, MD;  Location: [facility] OR;  Service: Orthopedics;  Laterality: Left;    Allergies  No Known Allergies  Home Medications    Prior to Admission medications   Medication Sig Start Date End Date Taking? Authorizing Provider  albuterol  (PROVENTIL  HFA;VENTOLIN  HFA) 108 (90 Base) MCG/ACT inhaler Inhale 2 puffs into the lungs every 6 (six) hours as needed for wheezing or shortness of breath.    [provider]  albuterol  (PROVENTIL ) (2.5 MG/3ML) 0.083% nebulizer solution Take 3 mLs (2.5 mg total) by nebulization every 6 (six) hours as needed for wheezing or shortness of breath. 11/14/17   Cleotilde Rogue, MD  amLODipine  (NORVASC ) 5 MG tablet Take 1 tablet (5 mg total) by mouth daily. Patient not taking: Reported on 02/26/2024 04/09/23   Elmira Charles PARAS, MD  aspirin  EC 81 MG tablet Take 1 tablet (81 mg total) by mouth daily. 03/13/19   [name], Charles PARAS, MD  Atogepant  (QULIPTA ) 60 MG TABS Take 1 tablet (60 mg total) by mouth daily. 12/12/23   [name], DO  azithromycin  (ZITHROMAX ) 250 MG tablet Take 1 tablet (250 mg total) by mouth daily. Take first 2 tablets together, then 1 every day until finished. Patient not taking: Reported on 02/26/2024 11/16/22   Long, Joshua G, MD  baclofen (LIORESAL) 10 MG tablet Take 10 mg by mouth at bedtime as needed for muscle spasms. 02/23/21   [provider]  budesonide -formoterol  (SYMBICORT ) 160-4.5 MCG/ACT inhaler Inhale 2 puffs into the lungs 2 (two) times daily as needed (shortness  of breath).    [provider]  carvedilol  (COREG ) 6.25 MG tablet Take 6.25 mg by mouth 2 (two) times daily with a meal.    [provider]  celecoxib (CELEBREX) 100 MG capsule Take 100 mg by mouth daily as needed for moderate pain. 01/02/22   [provider]  Cholecalciferol (VITAMIN D3) 50 MCG (2000 UT) TABS Take 2,000 Units by mouth 2 (two) times daily.    [provider]  diclofenac Sodium (VOLTAREN) 1 % GEL Apply 2 g topically daily as needed (pain).    [provider]  docusate sodium  (COLACE) 100 MG capsule Take 1 capsule (100 mg total) by mouth daily as needed for up to 30 doses. Patient not taking: Reported on 02/26/2024 11/12/22   Selma Donnice SAUNDERS, MD  empagliflozin (JARDIANCE) 25 MG TABS tablet Take 12.5 mg by mouth daily. 02/25/23   [provider]  ferrous sulfate 325 (65 FE) MG tablet Take 325 mg by mouth daily as needed.    [provider]  fluticasone  (FLONASE ) 50 MCG/ACT nasal spray Place 2 sprays into both nostrils daily as needed for allergies or rhinitis.    [provider]  Fluticasone -Salmeterol (ADVAIR) 250-50 MCG/DOSE AEPB Inhale 1 puff into the lungs in the morning and at bedtime. 02/29/20   [provider]  gabapentin  (NEURONTIN ) 300 MG capsule Take 300 mg by mouth 2 (two) times daily as needed (pain).    [provider]  guaiFENesin  (MUCINEX ) 600 MG 12 hr tablet Take 1,200 mg by mouth 2 (two) times daily.    [provider]  lidocaine  (LIDODERM ) 5 % Place 1 patch onto the skin daily. Remove & Discard patch within 12 hours or as directed by MD Patient taking differently: Place 1 patch onto the skin daily as needed (pain). Remove & Discard patch within 12 hours or as directed by MD 03/17/22   [name], [name], PA-C  losartan  (COZAAR ) 50 MG tablet Take 1 tablet (50 mg total) by mouth daily. Patient not taking: Reported on 02/26/2024 04/09/23   Elmira Charles PARAS, MD  omeprazole   (PRILOSEC) 40 MG capsule Take 40 mg by mouth daily as needed (acid reflux).    [provider]  ondansetron  (ZOFRAN ) 4 MG tablet Take 1 tablet (4 mg total) by mouth every 6 (six) hours. Patient taking differently: Take 4 mg by mouth every 8 (eight) hours as needed for nausea or vomiting. 09/26/22   Prosperi, Christian H, PA-C  oxyCODONE -acetaminophen  (PERCOCET) 5-325 MG tablet Take 1 tablet by mouth every 4 (four) hours as needed for up to 18 doses for severe pain. 11/12/22   Selma Donnice SAUNDERS, MD  polycarbophil (FIBERCON) 625 MG tablet Take 625 mg by mouth in the morning and at bedtime.    [provider]  rosuvastatin  (CRESTOR ) 20 MG tablet Take 10 mg by mouth at bedtime.    [provider]  Semaglutide, 2 MG/DOSE, 8 MG/3ML SOPN Inject 2 mg into the skin every 7 (seven) days. 02/25/24   [provider]  sertraline  (ZOLOFT ) 100 MG tablet Take 100 mg by mouth daily. 07/29/19   [provider]  tamsulosin  (FLOMAX ) 0.4 MG CAPS capsule Take 1 capsule (0.4 mg total) by mouth daily. 09/26/22   Prosperi, Christian H, PA-C  terbinafine (LAMISIL) 1 % cream Apply 1 Application topically daily as needed (irritation). 01/02/22   [provider]  topiramate  (TOPAMAX ) 50 MG tablet Take 50 mg by mouth 2 (two) times daily.    [provider]  Ubrogepant  (UBRELVY ) 100 MG TABS Take 1 tablet (100 mg total) by mouth as needed (May repeat in 2 hours if needed.  Maximum 2 tablets in 24 hours.). 12/12/23   [name], DO    Physical Exam    Vital Signs:  Charles Riggs. does not have vital signs available for review today.  Given telephonic nature of communication, physical exam is limited. AAOx3. NAD. Normal affect.  Speech and respirations are unlabored.  Accessory Clinical Findings    None  Assessment & Plan    1.  Preoperative Cardiovascular Risk Assessment:  The patient affirms he has been doing well without any new cardiac symptoms. They are able to  achieve 7 METS without cardiac limitations. Therefore, based on ACC/AHA guidelines, the patient would be at acceptable risk for the planned procedure without further cardiovascular testing. The patient was advised that if he develops new symptoms prior to surgery to contact our office to arrange for a follow-up visit, and he verbalized understanding.  - Patient's RCRI score is 0.4%  The patient was advised that if he develops new symptoms prior to surgery to contact our office to arrange  for a follow-up visit, and he verbalized understanding.  Per protocol patient can hold ASA 81 mg 5-7 days prior to procedure and should restart postprocedure when surgically safe.  A copy of this note will be routed to requesting surgeon.  Time:   Today, I have spent 6 minutes with the patient with telehealth technology discussing medical history, symptoms, and management plan.     Wyn Raddle, Jackee Shove, NP  02/28/2024, 7:05 AM

## 2024-03-02 NOTE — Anesthesia Preprocedure Evaluation (Addendum)
 Anesthesia Evaluation  Patient identified by MRN, date of birth, ID band Patient awake    Reviewed: Allergy & Precautions, NPO status , Patient's Chart, lab work & pertinent test results  History of Anesthesia Complications Negative for: history of anesthetic complications  Airway Mallampati: III  TM Distance: >3 FB Neck ROM: Full   Comment: Previous grade I view with Miller 2, easy mask Dental  (+) Dental Advisory Given, Missing,    Pulmonary neg shortness of breath, asthma , sleep apnea , neg COPD, neg recent URI, former smoker   Pulmonary exam normal breath sounds clear to auscultation       Cardiovascular hypertension (amlodipine , carvedilol , losartan ), Pt. on medications and Pt. on home beta blockers (-) angina + CAD and +CHF  (-) Past MI, (-) Cardiac Stents and (-) CABG + dysrhythmias (1st degree AV block) + Valvular Problems/Murmurs (s/p AVR 12/16/2018) AI  Rhythm:Regular Rate:Normal  HLD  Echocardiogram 10/07/2020: Normal LV systolic function with visual EF 55-60%. Left ventricle cavity is small. Moderate concentric hypertrophy of the left ventricle. Normal global wall motion. Normal diastolic filling pattern. Patient has A 25mm an Edwards bovine bioprosthetic valve in the aortic position. Procedure Date: 12/16/2018. No AV regurgitation. Trace to mild aortic valve  stenosis. Peak velocity 2.11 m/s, Peak Gradient 18 mmHg, Mean Gradient  9.3 mmHg, AVA 1.7cm. No significant change from 12/29/2018.  R/LHC 10/14/2018: LM: Normal LAD: Normal Ramus: Normal LCx: Normal RCA: Normal   Angiographically normal coronary arteries with no significant CAD Mild pulmonary hypertnension mean PA 26 mmHg     Neuro/Psych  PSYCHIATRIC DISORDERS  Depression    negative neurological ROS     GI/Hepatic Neg liver ROS,GERD  Medicated,,  Endo/Other  diabetes, Type 2    Renal/GU CRFRenal disease     Musculoskeletal  (+) Arthritis  , Osteoarthritis,    Abdominal  (+) + obese  Peds  Hematology  (+) Blood dyscrasia, anemia   Anesthesia Other Findings Last Jardiance: 1 week ago  Last semaglutide: 02/24/2024  Reproductive/Obstetrics                              Anesthesia Physical Anesthesia Plan  ASA: 3  Anesthesia Plan: General   Post-op Pain Management: Tylenol  PO (pre-op)*   Induction: Intravenous  PONV Risk Score and Plan: 2 and Ondansetron , Dexamethasone , Midazolam  and Treatment may vary due to age or medical condition  Airway Management Planned: LMA  Additional Equipment:   Intra-op Plan:   Post-operative Plan: Extubation in OR  Informed Consent: I have reviewed the patients History and Physical, chart, labs and discussed the procedure including the risks, benefits and alternatives for the proposed anesthesia with the patient or authorized representative who has indicated his/her understanding and acceptance.     Dental advisory given  Plan Discussed with: CRNA and Anesthesiologist  Anesthesia Plan Comments: (See PAT note 02/28/24  Risks of general anesthesia discussed including, but not limited to, sore throat, hoarse voice, chipped/damaged teeth, injury to vocal cords, nausea and vomiting, allergic reactions, lung infection, heart attack, stroke, and death. All questions answered. )         Anesthesia Quick Evaluation

## 2024-03-02 NOTE — Progress Notes (Signed)
 Anesthesia Chart Review   Case: [id number] Date/Time: 03/09/24 1334   Procedures:      CYSTOSCOPY/URETEROSCOPY/HOLMIUM LASER/STENT PLACEMENT (Left)     CYSTOSCOPY, WITH RETROGRADE PYELOGRAM (Bilateral)   Anesthesia type: General   Diagnosis: Calculus, renal [N20.0]   Pre-op diagnosis: BILATERAL RENAL CALCULUS   Location: [facility]OM 03 / [facility]   Surgeons: Selma Donnice SAUNDERS, MD       DISCUSSION:68 y.o. former smoker with h/o OSA, HTN, LBBB, CHF s/p bioprosthetic aortic valve replacement 2020, CHF, CKD Stage II, bilateral renal calculus scheduled for above procedure 03/09/2024 with Dr. Donnice Selma.   Per cardiology preoperative evaluation 02/28/2024, The patient affirms he has been doing well without any new cardiac symptoms. They are able to achieve 7 METS without cardiac limitations. Therefore, based on ACC/AHA guidelines, the patient would be at acceptable risk for the planned procedure without further cardiovascular testing. The patient was advised that if he develops new symptoms prior to surgery to contact our office to arrange for a follow-up visit, and he verbalized understanding.  - Patient's RCRI score is 0.4%   The patient was advised that if he develops new symptoms prior to surgery to contact our office to arrange for a follow-up visit, and he verbalized understanding.   Per protocol patient can hold ASA 81 mg 5-7 days prior to procedure and should restart postprocedure when surgically safe.   VS: Ht 5' 11 (1.803 m)   Wt 113.4 kg   BMI 34.87 kg/m   PROVIDERS: [facility]  Primary Cardiologist:  Newman JINNY Lawrence, MD  LABS: Labs reviewed: Acceptable for surgery. (all labs ordered are listed, but only abnormal results are displayed)  Labs Reviewed - No data to display   IMAGES:   EKG:   CV: Echocardiogram 10/07/2020: Normal LV systolic function with visual EF 55-60%. Left ventricle cavity is small. Moderate concentric hypertrophy of the left ventricle.  Normal global wall motion. Normal diastolic filling pattern. Patient has A 25mm an Edwards bovine bioprosthetic valve in the aortic position. Procedure Date: 12/16/2018. No AV regurgitation. Trace to mild aortic valve  stenosis. Peak velocity 2.11 m/s, Peak Gradient 18 mmHg, Mean Gradient  9.3 mmHg, AVA 1.7cm. No significant change from 12/29/2018.      Past Medical History:  Diagnosis Date   Aortic insufficiency    Arthritis    CHF (congestive heart failure) (HCC)    CKD (chronic kidney disease)    Essential hypertension    History of kidney stones    Nerve pain    Non-rheumatic aortic regurgitation    Obstructive sleep apnea    cpap   S/P minimally invasive aortic valve replacement with bioprosthetic valve 12/16/2018   25 mm Edwards Inspiris Resilia stented bovine pericardial tissue valve via right mini thoracotomy approach   SOB (shortness of breath)    with exertion   Syncope 12/2018    Past Surgical History:  Procedure Laterality Date   AORTIC VALVE REPLACEMENT N/A 12/16/2018   Procedure: MINIMALLY INVASIVE AORTIC VALVE REPLACEMENT (AVR) using Inspiris Aortic valve 25mm.;  Surgeon: Dusty Sudie DEL, MD;  Location: [facility] OR;  Service: Open Heart Surgery;  Laterality: N/A;   BACK SURGERY  2023   for stenosis   CYSTOSCOPY/URETEROSCOPY/HOLMIUM LASER/STENT PLACEMENT Right 11/12/2022   Procedure: CYSTOSCOPY/RIGHT RETROGRADE PYELOGRAM/RIGHT URETEROSCOPY/HOLMIUM LASER/RIGHT STENT PLACEMENT;  Surgeon: Selma Donnice SAUNDERS, MD;  Location[facility];  Service: Urology;  Laterality: Right;   HERNIA REPAIR     age 68   IR THORACENTESIS ASP  PLEURAL SPACE W/IMG GUIDE  12/29/2018   REVERSE SHOULDER ARTHROPLASTY Right 06/01/2021   Procedure: REVERSE SHOULDER ARTHROPLASTY;  Surgeon: Dozier Soulier, MD;  Location: [facility];  Service: Orthopedics;  Laterality: Right;   RIB PLATING  12/16/2018   Procedure: Rib Plating;  Surgeon: Dusty Sudie DEL, MD;  Location: [facility] OR;  Service: Open Heart Surgery;;    RIGHT/LEFT HEART CATH AND CORONARY ANGIOGRAPHY N/A 10/14/2018   Procedure: RIGHT/LEFT HEART CATH AND CORONARY ANGIOGRAPHY;  Surgeon: Elmira [name], MD;  [facility]: [facility];  Service: Cardiovascular;  Laterality: N/A;   TEE WITHOUT CARDIOVERSION N/A 11/11/2018   Procedure: TRANSESOPHAGEAL ECHOCARDIOGRAM (TEE);  Surgeon: Elmira [name], MD;  Location: [facility] ENDOSCOPY;  Service: Cardiovascular;  Laterality: N/A;   TEE WITHOUT CARDIOVERSION N/A 12/16/2018   Procedure: TRANSESOPHAGEAL ECHOCARDIOGRAM (TEE);  Surgeon: Dusty Sudie DEL, MD;  Location: [facility] OR;  Service: Open Heart Surgery;  Laterality: N/A;   TOTAL HIP ARTHROPLASTY Left 03/09/2019   Procedure: LEFT TOTAL HIP ARTHROPLASTY ANTERIOR APPROACH;  Surgeon: Jerri Kay HERO, MD;  Location: [facility];  Service: Orthopedics;  Laterality: Left;    MEDICATIONS:  albuterol  (PROVENTIL  HFA;VENTOLIN  HFA) 108 (90 Base) MCG/ACT inhaler   albuterol  (PROVENTIL ) (2.5 MG/3ML) 0.083% nebulizer solution   amLODipine  (NORVASC ) 5 MG tablet   aspirin  EC 81 MG tablet   Atogepant  (QULIPTA ) 60 MG TABS   azithromycin  (ZITHROMAX ) 250 MG tablet   baclofen (LIORESAL) 10 MG tablet   budesonide -formoterol  (SYMBICORT ) 160-4.5 MCG/ACT inhaler   carvedilol  (COREG ) 6.25 MG tablet   celecoxib (CELEBREX) 100 MG capsule   Cholecalciferol (VITAMIN D3) 50 MCG (2000 UT) TABS   diclofenac Sodium (VOLTAREN) 1 % GEL   docusate sodium  (COLACE) 100 MG capsule   empagliflozin (JARDIANCE) 25 MG TABS tablet   ferrous sulfate 325 (65 FE) MG tablet   fluticasone  (FLONASE ) 50 MCG/ACT nasal spray   Fluticasone -Salmeterol (ADVAIR) 250-50 MCG/DOSE AEPB   gabapentin  (NEURONTIN ) 300 MG capsule   guaiFENesin  (MUCINEX ) 600 MG 12 hr tablet   lidocaine  (LIDODERM ) 5 %   losartan  (COZAAR ) 50 MG tablet   omeprazole  (PRILOSEC) 40 MG capsule   ondansetron  (ZOFRAN ) 4 MG tablet   oxyCODONE -acetaminophen  (PERCOCET) 5-325 MG tablet   polycarbophil (FIBERCON) 625 MG tablet    rosuvastatin  (CRESTOR ) 20 MG tablet   Semaglutide, 2 MG/DOSE, 8 MG/3ML SOPN   sertraline  (ZOLOFT ) 100 MG tablet   tamsulosin  (FLOMAX ) 0.4 MG CAPS capsule   terbinafine (LAMISIL) 1 % cream   topiramate  (TOPAMAX ) 50 MG tablet   Ubrogepant  (UBRELVY ) 100 MG TABS   No current facility-administered medications for this encounter.    Harlene Hoots Ward, PA-C [facility] [phone]

## 2024-03-03 ENCOUNTER — Telehealth

## 2024-03-09 ENCOUNTER — Encounter (HOSPITAL_COMMUNITY): Payer: Self-pay | Admitting: Urology

## 2024-03-09 ENCOUNTER — Ambulatory Visit (HOSPITAL_BASED_OUTPATIENT_CLINIC_OR_DEPARTMENT_OTHER): Admitting: Anesthesiology

## 2024-03-09 ENCOUNTER — Ambulatory Visit (HOSPITAL_COMMUNITY)

## 2024-03-09 ENCOUNTER — Encounter (HOSPITAL_COMMUNITY)
Admission: RE | Disposition: A | Discharge status: Home / Self Care | Payer: Self-pay | Source: Home / Self Care | Attending: Urology

## 2024-03-09 ENCOUNTER — Ambulatory Visit (HOSPITAL_COMMUNITY)
Admission: RE | Admit: 2024-03-09 | Discharge: 2024-03-09 | Disposition: A | Discharge status: Home / Self Care | Attending: Urology | Admitting: Urology

## 2024-03-09 ENCOUNTER — Ambulatory Visit (HOSPITAL_COMMUNITY): Payer: Self-pay | Admitting: Physician Assistant

## 2024-03-09 ENCOUNTER — Other Ambulatory Visit: Payer: Self-pay

## 2024-03-09 DIAGNOSIS — I2510 Atherosclerotic heart disease of native coronary artery without angina pectoris: Secondary | ICD-10-CM | POA: Diagnosis not present

## 2024-03-09 DIAGNOSIS — N189 Chronic kidney disease, unspecified: Secondary | ICD-10-CM | POA: Diagnosis not present

## 2024-03-09 DIAGNOSIS — Z7984 Long term (current) use of oral hypoglycemic drugs: Secondary | ICD-10-CM | POA: Diagnosis not present

## 2024-03-09 DIAGNOSIS — N200 Calculus of kidney: Secondary | ICD-10-CM | POA: Insufficient documentation

## 2024-03-09 DIAGNOSIS — Z87891 Personal history of nicotine dependence: Secondary | ICD-10-CM | POA: Diagnosis not present

## 2024-03-09 DIAGNOSIS — I130 Hypertensive heart and chronic kidney disease with heart failure and stage 1 through stage 4 chronic kidney disease, or unspecified chronic kidney disease: Secondary | ICD-10-CM | POA: Diagnosis not present

## 2024-03-09 DIAGNOSIS — I10 Essential (primary) hypertension: Secondary | ICD-10-CM | POA: Diagnosis not present

## 2024-03-09 DIAGNOSIS — K219 Gastro-esophageal reflux disease without esophagitis: Secondary | ICD-10-CM | POA: Diagnosis not present

## 2024-03-09 DIAGNOSIS — I440 Atrioventricular block, first degree: Secondary | ICD-10-CM | POA: Insufficient documentation

## 2024-03-09 DIAGNOSIS — Z953 Presence of xenogenic heart valve: Secondary | ICD-10-CM | POA: Insufficient documentation

## 2024-03-09 DIAGNOSIS — E785 Hyperlipidemia, unspecified: Secondary | ICD-10-CM | POA: Insufficient documentation

## 2024-03-09 DIAGNOSIS — N529 Male erectile dysfunction, unspecified: Secondary | ICD-10-CM | POA: Diagnosis not present

## 2024-03-09 DIAGNOSIS — E1122 Type 2 diabetes mellitus with diabetic chronic kidney disease: Secondary | ICD-10-CM | POA: Insufficient documentation

## 2024-03-09 DIAGNOSIS — J45909 Unspecified asthma, uncomplicated: Secondary | ICD-10-CM | POA: Insufficient documentation

## 2024-03-09 DIAGNOSIS — Z7985 Long-term (current) use of injectable non-insulin antidiabetic drugs: Secondary | ICD-10-CM | POA: Diagnosis not present

## 2024-03-09 DIAGNOSIS — I509 Heart failure, unspecified: Secondary | ICD-10-CM | POA: Diagnosis not present

## 2024-03-09 DIAGNOSIS — Z01818 Encounter for other preprocedural examination: Secondary | ICD-10-CM

## 2024-03-09 DIAGNOSIS — N132 Hydronephrosis with renal and ureteral calculous obstruction: Secondary | ICD-10-CM | POA: Diagnosis present

## 2024-03-09 HISTORY — PX: CYSTOSCOPY/URETEROSCOPY/HOLMIUM LASER/STENT PLACEMENT: SHX6546

## 2024-03-09 HISTORY — PX: CYSTOSCOPY W/ RETROGRADES: SHX1426

## 2024-03-09 LAB — CBC
HCT: 45.5 % (ref 39.0–52.0)
Hemoglobin: 13.9 g/dL (ref 13.0–17.0)
MCH: 26.7 pg (ref 26.0–34.0)
MCHC: 30.5 g/dL (ref 30.0–36.0)
MCV: 87.5 fL (ref 80.0–100.0)
Platelets: 191 K/uL (ref 150–400)
RBC: 5.2 MIL/uL (ref 4.22–5.81)
RDW: 13.4 % (ref 11.5–15.5)
WBC: 3.4 K/uL — ABNORMAL LOW (ref 4.0–10.5)
nRBC: 0 % (ref 0.0–0.2)

## 2024-03-09 LAB — BASIC METABOLIC PANEL WITH GFR
Anion gap: 11 (ref 5–15)
BUN: 13 mg/dL (ref 8–23)
CO2: 25 mmol/L (ref 22–32)
Calcium: 10.2 mg/dL (ref 8.9–10.3)
Chloride: 105 mmol/L (ref 98–111)
Creatinine, Ser: 1.19 mg/dL (ref 0.61–1.24)
GFR, Estimated: >60 mL/min (ref >60)
Glucose, Bld: 94 mg/dL (ref 70–99)
Potassium: 3.9 mmol/L (ref 3.5–5.1)
Sodium: 141 mmol/L (ref 135–145)

## 2024-03-09 LAB — HEMOGLOBIN A1C
Hgb A1c MFr Bld: 5.8 % — ABNORMAL HIGH (ref 4.8–5.6)
Mean Plasma Glucose: 119.76 mg/dL

## 2024-03-09 LAB — GLUCOSE, CAPILLARY: Glucose-Capillary: 87 mg/dL (ref 70–99)

## 2024-03-09 SURGERY — CYSTOSCOPY/URETEROSCOPY/HOLMIUM LASER/STENT PLACEMENT
Anesthesia: General | Laterality: Left

## 2024-03-09 MED ORDER — PROPOFOL 10 MG/ML IV BOLUS
INTRAVENOUS | Status: AC
  Filled 2024-03-09: qty 20

## 2024-03-09 MED ORDER — OXYCODONE HCL 5 MG PO TABS: 5.0000 mg | ORAL_TABLET | Freq: Once | ORAL | Status: DC | PRN

## 2024-03-09 MED ORDER — ACETAMINOPHEN 500 MG PO TABS
1000.0000 mg | ORAL_TABLET | Freq: Once | ORAL | Status: AC
  Administered 2024-03-09: 1000 mg via ORAL
  Filled 2024-03-09: qty 2

## 2024-03-09 MED ORDER — OXYCODONE-ACETAMINOPHEN 5-325 MG PO TABS: 1.0000 | ORAL_TABLET | ORAL | 0 refills | Status: DC | PRN

## 2024-03-09 MED ORDER — ONDANSETRON HCL 4 MG/2ML IJ SOLN
INTRAMUSCULAR | Status: DC | PRN
  Administered 2024-03-09: 4 mg via INTRAVENOUS

## 2024-03-09 MED ORDER — PHENYLEPHRINE 80 MCG/ML (10ML) SYRINGE FOR IV PUSH (FOR BLOOD PRESSURE SUPPORT)
PREFILLED_SYRINGE | INTRAVENOUS | Status: AC
  Filled 2024-03-09: qty 20

## 2024-03-09 MED ORDER — CIPROFLOXACIN IN D5W 400 MG/200ML IV SOLN
400.0000 mg | INTRAVENOUS | Status: DC
Start: 2024-03-09 — End: 2024-03-09

## 2024-03-09 MED ORDER — DEXAMETHASONE SOD PHOSPHATE PF 10 MG/ML IJ SOLN
INTRAMUSCULAR | Status: DC | PRN
  Administered 2024-03-09: 5 mg via INTRAVENOUS

## 2024-03-09 MED ORDER — EPHEDRINE 5 MG/ML INJ
INTRAVENOUS | Status: AC
  Filled 2024-03-09: qty 5

## 2024-03-09 MED ORDER — LACTATED RINGERS IV SOLN: INTRAVENOUS | Status: DC

## 2024-03-09 MED ORDER — FENTANYL CITRATE (PF) 50 MCG/ML IJ SOSY: 25.0000 ug | PREFILLED_SYRINGE | INTRAMUSCULAR | Status: DC | PRN

## 2024-03-09 MED ORDER — ONDANSETRON HCL 4 MG/2ML IJ SOLN
INTRAMUSCULAR | Status: AC
  Filled 2024-03-09: qty 2

## 2024-03-09 MED ORDER — PROPOFOL 10 MG/ML IV BOLUS
INTRAVENOUS | Status: DC | PRN
  Administered 2024-03-09: 250 mg via INTRAVENOUS

## 2024-03-09 MED ORDER — ORAL CARE MOUTH RINSE: 15.0000 mL | Freq: Once | OROMUCOSAL | Status: AC

## 2024-03-09 MED ORDER — CHLORHEXIDINE GLUCONATE 0.12 % MT SOLN
15.0000 mL | Freq: Once | OROMUCOSAL | Status: AC
  Administered 2024-03-09: 15 mL via OROMUCOSAL

## 2024-03-09 MED ORDER — SODIUM CHLORIDE 0.9 % IR SOLN
Status: DC | PRN
  Administered 2024-03-09: 3000 mL

## 2024-03-09 MED ORDER — IOHEXOL 300 MG/ML  SOLN
INTRAMUSCULAR | Status: DC | PRN
  Administered 2024-03-09: 14 mL

## 2024-03-09 MED ORDER — MIDAZOLAM HCL 2 MG/2ML IJ SOLN
INTRAMUSCULAR | Status: AC
  Filled 2024-03-09: qty 2

## 2024-03-09 MED ORDER — LIDOCAINE HCL (PF) 2 % IJ SOLN
INTRAMUSCULAR | Status: AC
  Filled 2024-03-09: qty 5

## 2024-03-09 MED ORDER — ACETAMINOPHEN 500 MG PO TABS
ORAL_TABLET | ORAL | Status: AC
  Filled 2024-03-09: qty 1

## 2024-03-09 MED ORDER — LIDOCAINE HCL (CARDIAC) PF 100 MG/5ML IV SOSY
PREFILLED_SYRINGE | INTRAVENOUS | Status: DC | PRN
  Administered 2024-03-09: 100 mg via INTRAVENOUS

## 2024-03-09 MED ORDER — CIPROFLOXACIN IN D5W 400 MG/200ML IV SOLN
400.0000 mg | INTRAVENOUS | Status: AC
  Administered 2024-03-09: 400 mg via INTRAVENOUS
  Filled 2024-03-09: qty 200

## 2024-03-09 MED ORDER — 0.9 % SODIUM CHLORIDE (POUR BTL) OPTIME
TOPICAL | Status: DC | PRN
  Administered 2024-03-09: 1000 mL

## 2024-03-09 MED ORDER — PHENYLEPHRINE 80 MCG/ML (10ML) SYRINGE FOR IV PUSH (FOR BLOOD PRESSURE SUPPORT)
PREFILLED_SYRINGE | INTRAVENOUS | Status: DC | PRN
  Administered 2024-03-09: 80 ug via INTRAVENOUS

## 2024-03-09 MED ORDER — AMISULPRIDE (ANTIEMETIC) 5 MG/2ML IV SOLN: 10.0000 mg | Freq: Once | INTRAVENOUS | Status: DC | PRN

## 2024-03-09 MED ORDER — FENTANYL CITRATE (PF) 100 MCG/2ML IJ SOLN
INTRAMUSCULAR | Status: AC
  Filled 2024-03-09: qty 2

## 2024-03-09 MED ORDER — MIDAZOLAM HCL 5 MG/5ML IJ SOLN
INTRAMUSCULAR | Status: DC | PRN
  Administered 2024-03-09: 2 mg via INTRAVENOUS

## 2024-03-09 MED ORDER — FENTANYL CITRATE (PF) 100 MCG/2ML IJ SOLN
INTRAMUSCULAR | Status: DC | PRN
  Administered 2024-03-09: 50 ug via INTRAVENOUS
  Administered 2024-03-09 (×2): 25 ug via INTRAVENOUS

## 2024-03-09 MED ORDER — OXYCODONE HCL 5 MG/5ML PO SOLN: 5.0000 mg | Freq: Once | ORAL | Status: DC | PRN

## 2024-03-09 SURGICAL SUPPLY — 23 items
BAG URO CATCHER STRL LF (MISCELLANEOUS) ×2 IMPLANT
BASKET ZERO TIP NITINOL 2.4FR (BASKET) IMPLANT
BENZOIN TINCTURE PRP APPL 2/3 (GAUZE/BANDAGES/DRESSINGS) IMPLANT
CATH URETERAL DUAL LUMEN 10F (MISCELLANEOUS) IMPLANT
CATH URETL OPEN 5X70 (CATHETERS) ×2 IMPLANT
CLOTH BEACON ORANGE TIMEOUT ST (SAFETY) ×2 IMPLANT
DRSG TEGADERM 2-3/8X2-3/4 SM (GAUZE/BANDAGES/DRESSINGS) IMPLANT
FIBER LASER MOSES 200 DFL (Laser) IMPLANT
GLOVE BIOGEL M 7.0 STRL (GLOVE) ×2 IMPLANT
GOWN STRL REUS W/ TWL XL LVL3 (GOWN DISPOSABLE) ×2 IMPLANT
GUIDEWIRE STR DUAL SENSOR (WIRE) ×4 IMPLANT
GUIDEWIRE ZIPWRE .038 STRAIGHT (WIRE) IMPLANT
KIT TURNOVER KIT A (KITS) ×2 IMPLANT
MANIFOLD NEPTUNE II (INSTRUMENTS) ×2 IMPLANT
PACK CYSTO (CUSTOM PROCEDURE TRAY) ×2 IMPLANT
PAD PREP 24X48 CUFFED NSTRL (MISCELLANEOUS) ×2 IMPLANT
SHEATH DILATOR SET 8/10 (MISCELLANEOUS) IMPLANT
SHEATH NAVIGATOR HD 11/13X28 (SHEATH) IMPLANT
SHEATH NAVIGATOR HD 11/13X36 (SHEATH) IMPLANT
STENT URET 6FRX28 CONTOUR (STENTS) IMPLANT
TRACTIP FLEXIVA PULS ID 200XHI (Laser) IMPLANT
TUBING CONNECTING 10 (TUBING) ×2 IMPLANT
TUBING UROLOGY SET (TUBING) ×2 IMPLANT
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
## 2024-03-09 NOTE — Discharge Instructions (Signed)
 [facility] [phone] Post Ureteroscopy With or Without Stent Instructions  Definitions:  Ureter: The duct that transports urine from the kidney to the bladder. Stent:   A plastic hollow tube that is placed into the ureter, from the kidney to the bladder to prevent the ureter from swelling shut.  GENERAL INSTRUCTIONS:  Despite the fact that no skin incisions were used, the area around the ureter and bladder is raw and irritated. The stent is a foreign body which will further irritate the bladder wall. This irritation is manifested by increased frequency of urination, both day and night, and by an increase in the urge to urinate. In some, the urge to urinate is present almost always. Sometimes the urge is strong enough that you may not be able to stop yourself from urinating. The only real cure is to remove the stent and then give time for the bladder wall to heal which can't be done until the danger of the ureter swelling shut has passed, which varies.  You may see some blood in your urine while the stent is in place and a few days afterwards. Do not be alarmed, even if the urine was clear for a while. Get off your feet and drink lots of fluids until clearing occurs. If you start to pass clots or don't improve, call us .  DIET: You may return to your normal diet immediately. Because of the raw surface of your bladder, alcohol, spicy foods, acid type foods and drinks with caffeine may cause irritation or frequency and should be used in moderation. To keep your urine flowing freely and to avoid constipation, drink plenty of fluids during the day ( 8-10 glasses ). Tip: Avoid cranberry juice because it is very acidic.  ACTIVITY: Your physical activity doesn't need to be restricted. However, if you are very active, you may see some blood in your urine. We suggest that you reduce your activity under these circumstances until the bleeding has stopped.  BOWELS: It is important to  keep your bowels regular during the postoperative period. Straining with bowel movements can cause bleeding. A bowel movement every other day is reasonable. Use a mild laxative if needed, such as Milk of Magnesia 2-3 tablespoons, or 2 Dulcolax tablets. Call if you continue to have problems. If you have been taking narcotics for pain, before, during or after your surgery, you may be constipated. Take a laxative if necessary.   MEDICATION: You should resume your pre-surgery medications unless told not to. In addition you will often be given an antibiotic to prevent infection. These should be taken as prescribed until the bottles are finished unless you are having an unusual reaction to one of the drugs.  PROBLEMS YOU SHOULD REPORT TO US : Fevers over 100.5 Fahrenheit. Heavy bleeding, or clots ( See above notes about blood in urine ). Inability to urinate. Drug reactions ( hives, rash, nausea, vomiting, diarrhea ). Severe burning or pain with urination that is not improving.  FOLLOW-UP: You will need a follow-up appointment to monitor your progress. Call for this appointment at the number listed above. Usually the first appointment will be about three to fourteen days after your surgery.   You have stents draining your kidney that will remain in place until scheduled surgery.

## 2024-03-09 NOTE — Anesthesia Postprocedure Evaluation (Signed)
 Anesthesia Post Note  Patient: Charles Riggs.  Procedure(s) Performed: CYSTOSCOPY/URETEROSCOPY/HOLMIUM LASER/STENT PLACEMENT (Left) CYSTOSCOPY, WITH RETROGRADE PYELOGRAM (Bilateral)     Patient location during evaluation: PACU Anesthesia Type: General Level of consciousness: awake Pain management: pain level controlled Vital Signs Assessment: post-procedure vital signs reviewed and stable Respiratory status: spontaneous breathing, nonlabored ventilation and respiratory function stable Cardiovascular status: blood pressure returned to baseline and stable Postop Assessment: no apparent nausea or vomiting Anesthetic complications: no   No notable events documented.  Last Vitals:  Vitals:   03/09/24 1600 03/09/24 1615  BP: (!) 122/93 (!) 125/94  Pulse: 77 (!) 39  Resp: (!) 22 15  Temp:    SpO2: 100% 99%    Last Pain:  Vitals:   03/09/24 1615  TempSrc:   PainSc: 0-No pain                 [name]

## 2024-03-09 NOTE — Anesthesia Procedure Notes (Signed)
 Procedure Name: LMA Insertion Date/Time: 03/09/2024 2:21 PM  Performed by: Belvie Valri NOVAK, CRNAPre-anesthesia Checklist: Patient identified, Emergency Drugs available, Suction available and Patient being monitored Patient Re-evaluated:Patient Re-evaluated prior to induction Oxygen Delivery Method: Circle System Utilized Preoxygenation: Pre-oxygenation with 100% oxygen Induction Type: IV induction Ventilation: Mask ventilation without difficulty LMA: LMA inserted LMA Size: 5.0 Number of attempts: 1 Placement Confirmation: positive ETCO2 Tube secured with: Tape Dental Injury: Teeth and Oropharynx as per pre-operative assessment

## 2024-03-09 NOTE — Transfer of Care (Signed)
 Immediate Anesthesia Transfer of Care Note  Patient: Charles Riggs.  Procedure(s) Performed: CYSTOSCOPY/URETEROSCOPY/HOLMIUM LASER/STENT PLACEMENT (Left) CYSTOSCOPY, WITH RETROGRADE PYELOGRAM (Bilateral)  Patient Location: PACU  Anesthesia Type:General  Level of Consciousness: awake  Airway & Oxygen Therapy: Patient Spontanous Breathing and Patient connected to nasal cannula oxygen  Post-op Assessment: Report given to RN and Post -op Vital signs reviewed and stable  Post vital signs: Reviewed and stable  Last Vitals:  Vitals Value Taken Time  BP 113/72 03/09/24 15:49  Temp    Pulse 54 03/09/24 15:51  Resp 20 03/09/24 15:51  SpO2 100 % 03/09/24 15:51  Vitals shown include unfiled device data.  Last Pain:  Vitals:   03/09/24 1235  TempSrc: Oral  PainSc:       Patients Stated Pain Goal: 0 (03/09/24 1219)  Complications: No notable events documented.

## 2024-03-09 NOTE — Op Note (Signed)
 Operative Note  Preoperative diagnosis:  1.  Bilateral renal stone  Postoperative diagnosis: 1.  Bilateral stone  Procedure(s): 1.  Cystoscopy 2.  Bilateral ureteroscopy with laser lithotripsy and basket extraction of stones 3.  Bilateral retrograde pyelogram 4.  Bilateral ureteral stent placement 5. Fluoroscopy with intraoperative interpretation  Surgeon: Donnice Siad, MD  Assistants:  None  Anesthesia:  General  Complications:  None  EBL:  Minimal  Specimens: 1. None  Drains/Catheters: 1.  Right 6Fr x 28cm ureteral stent with tether string 2. Left 6Fr x 28cm ureteral stent with tether string  Intraoperative findings:   Cystoscopy demonstrated no suspicious bladder lesions. Right retrograde pyelogram with no hydronephrosis, no filling defects, no extravasation of contrast. Left retrograde pyelogram with medial deviation of the left ureter, no hydronephrosis, no extravasation of contrast, no filling defects. Right ureteroscopy demonstrated right upper pole, interpolar and lower pole stones that were all dusted.  A small stone was basket extracted. Left ureteroscopy demonstrated a left upper and middle pole stone and large left lower pole stones.  Left lower pole stones were manipulated into a left upper pole calyx and these were also dusted.  There were several small fragments remaining. Successful stent placement.  Indication:  Charles Riggs. is a 68 y.o. male with bilateral renal stones here for for staged bilateral ureteroscopy with laser lithotripsy and basket traction of stones.  Description of procedure: After informed consent was obtained from the patient, the patient was identified and taken to the operating room and placed in the supine position.  General anesthesia was administered as well as perioperative IV antibiotics.  At the beginning of the case, a time-out was performed to properly identify the patient, the surgery to be performed, and the surgical site.   Sequential compression devices were applied to the lower extremities at the beginning of the case for DVT prophylaxis.  The patient was then placed in the dorsal lithotomy supine position, prepped and draped in sterile fashion.  We then passed the 21-French rigid cystoscope through the urethra and into the bladder under vision without any difficulty, noting a normal urethra without strictures and a mildly obstructing prostate.  A systematic evaluation of the bladder revealed no evidence of any suspicious bladder lesions.  Ureteral orifices were in normal position.    Under cystoscopic and flouroscopic guidance, we cannulated the right ureteral orifice with a 5-French open-ended ureteral catheter and a gentle retrograde pyelogram was performed, revealing a normal caliber ureter without any filling defects. There was no hydronephrosis of the collecting system. A 0.038 sensor wire was then passed up to the level of the renal pelvis and secured to the drape as a safety wire. The ureteral catheter and cystoscope were removed, leaving the safety wire in place.   A semi-rigid ureteroscope was passed alongside the wire up the distal ureter which appeared normal. A second 0.038 sensor wire was passed under direct vision and the semirigid scope was removed. The flexible ureteroscope was advanced into the collecting system. The collecting system was inspected. The calculus was identified at the right upper, right interpolar and right lower pole calyx. Using the 272 micron holmium laser fiber, the stones were dusted completely.  2.2 Fr zero tip basket was used to remove a small fragments under visual guidance. With the ureteroscope in the kidney, a gentle pyelogram was performed to delineate the calyceal system and we evaluated the calyces systematically. We encountered small dust particles. The rest of the stone fragments were very tiny  and these were  irrigated away gently. The calyces were re-inspected and there were  no significant stone fragment residual.   We then withdrew the ureteroscope back down the ureter, noting no evidence of any stones along the course of the ureter.  Prior to removing the ureteroscope, we did pass the Glidewire back up to the ureter to the renal pelvis.  Once the ureteroscope was removed, we then used the Glidewire under fluoroscopic guidance and passed up a 6-French x 28 cm double-pigtail ureteral stent up the ureter, making sure that the proximal and distal ends coiled within the kidney and bladder respectively.   Under cystoscopic and flouroscopic guidance, we cannulated the left ureteral orifice with a 5-French open-ended ureteral catheter and a gentle retrograde pyelogram was performed, revealing a normal caliber ureter without any filling defects. There was no hydronephrosis of the collecting system. A 0.038 sensor wire was then passed up to the level of the renal pelvis and secured to the drape as a safety wire. The ureteral catheter and cystoscope were removed, leaving the safety wire in place.   A semi-rigid ureteroscope was passed alongside the wire up the distal ureter which appeared normal. A second 0.038 sensor wire was passed under direct vision and the semirigid scope was removed. The flexible ureteroscope was advanced into the collecting system. The collecting system was inspected. The calculus was identified at the left upper pole, interpole and lower pole calyx. Using the 272 micron holmium laser fiber, the stone was fragmented completely. With the ureteroscope in the kidney, a gentle pyelogram was performed to delineate the calyceal system and we evaluated the calyces systematically. There were some small stones.   We then withdrew the ureteroscope back down the ureter noting no evidence of any stones along the course of the ureter.  Prior to removing the ureteroscope, we did pass the Glidewire back up to the ureter to the renal pelvis.     Once the ureteroscope was  removed, the Glidewire was backloaded through the rigid cystoscope, which was then advanced down the urethra and into the bladder. We then used the Glidewire under direct vision through the rigid cystoscope and under fluoroscopic guidance and passed up a 6-French, 28 cm double-pigtail ureteral stent up ureter, making sure that the proximal and distal ends coiled within the kidney and bladder respectively.   The cystoscope was then advanced back into the bladder under vision.  We were able to see the distal stent coiling nicely within the bladder.  The bladder was then emptied with irrigation solution.  The cystoscope was then removed.    The patient tolerated the procedure well and there was no complication. Patient was awoken from anesthesia and taken to the recovery room in stable condition. I was present and scrubbed for the entirety of the case.  Plan:  Patient will be discharged home. He will present for second stage surgery as scheduled.  Charles R. Letetia Romanello MD [facility]  Pager: [phone]

## 2024-03-10 ENCOUNTER — Encounter (HOSPITAL_COMMUNITY): Payer: Self-pay | Admitting: Urology

## 2024-03-12 NOTE — Telephone Encounter (Signed)
 pt lft mess saying he needs another letter. cld him bk and lft mess on vmail to let me know what is needed

## 2024-03-13 ENCOUNTER — Other Ambulatory Visit: Payer: Self-pay | Admitting: Urology

## 2024-03-13 ENCOUNTER — Encounter (HOSPITAL_COMMUNITY): Payer: Self-pay

## 2024-03-13 ENCOUNTER — Encounter (HOSPITAL_COMMUNITY): Payer: Self-pay | Admitting: Urology

## 2024-03-13 NOTE — Progress Notes (Signed)
Surgery orders requested with Zona at Dr. Virgina [place] office.

## 2024-03-13 NOTE — Patient Instructions (Signed)
 SURGICAL WAITING ROOM VISITATION Patients having surgery or a procedure may have no more than 2 support people in the waiting area - these visitors may rotate.    Children under the age of 95 must have an adult with them who is not the patient.  If the patient needs to stay at the hospital during part of their recovery, the visitor guidelines for inpatient rooms apply. Pre-op nurse will coordinate an appropriate time for 1 support person to accompany patient in pre-op.  This support person may not rotate.    Please refer to the [facility] website for the visitor guidelines for Inpatients (after your surgery is over and you are in a regular room).       Your procedure is scheduled on: 03-23-24   Report to [facility] Main Entrance    Report to admitting at 7:15 AM   Call this number if you have problems the morning of surgery [phone]   Do not eat food or drink liquids :After Midnight.           If you have questions, please contact your surgeon's office.   FOLLOW  ANY ADDITIONAL PRE OP INSTRUCTIONS YOU RECEIVED FROM YOUR SURGEON'S OFFICE!!!     Oral Hygiene is also important to reduce your risk of infection.                                    Remember - BRUSH YOUR TEETH THE MORNING OF SURGERY WITH YOUR REGULAR TOOTHPASTE   Do NOT smoke after Midnight   Take these medicines the morning of surgery with A SIP OF WATER :    Qulipta    Carvedilol    Gabapentin    Topiramate    Omeprazole    Sertraline    Tamsulosin    Oxycodone  if needed   Ondansetron  if needed   Hold Aspirin  5 days prior to surgery   Stop all vitamins and herbal supplements 7 days before surgery  How to Manage Your Diabetes Before and After Surgery  Why is it important to control my blood sugar before and after surgery? Improving blood sugar levels before and after surgery helps healing and can limit problems. A way of improving blood sugar control is eating a healthy diet by:  Eating less sugar  and carbohydrates  Increasing activity/exercise  Talking with your doctor about reaching your blood sugar goals High blood sugars (greater than 180 mg/dL) can raise your risk of infections and slow your recovery, so you will need to focus on controlling your diabetes during the weeks before surgery. Make sure that the doctor who takes care of your diabetes knows about your planned surgery including the date and location.  How do I manage my blood sugar before surgery? Check your blood sugar at least 4 times a day, starting 2 days before surgery, to make sure that the level is not too high or low. Check your blood sugar the morning of your surgery when you wake up and every 2 hours until you get to the Short Stay unit. If your blood sugar is less than 70 mg/dL, you will need to treat for low blood sugar: Do not take insulin . Treat a low blood sugar (less than 70 mg/dL) with  cup of clear juice (cranberry or apple), 4 glucose tablets, OR glucose gel. Recheck blood sugar in 15 minutes after treatment (to make sure it is greater than 70 mg/dL). If your blood sugar is  not greater than 70 mg/dL on recheck, call [phone] for further instructions. Report your blood sugar to the short stay nurse when you get to Short Stay.  If you are admitted to the hospital after surgery: Your blood sugar will be checked by the staff and you will probably be given insulin  after surgery (instead of oral diabetes medicines) to make sure you have good blood sugar levels. The goal for blood sugar control after surgery is 80-180 mg/dL.   WHAT DO I DO ABOUT MY DIABETES MEDICATION?  Do not take oral diabetes medicines (pills) the morning of surgery.  Hold Ozempic 7 days before surgery, (do not take after 03-15-24).  Hold Jardiance 3 days before surgery (do not take after 03-19-24)      DO NOT TAKE THE FOLLOWING 7 DAYS PRIOR TO SURGERY: Ozempic, Wegovy, Rybelsus (Semaglutide), Byetta (exenatide), Bydureon  (exenatide ER), Victoza, Saxenda (liraglutide), or Trulicity (dulaglutide) Mounjaro (Tirzepatide) Adlyxin (Lixisenatide), Polyethylene Glycol Loxenatide.  Reviewed and Endorsed by [facility] Patient Education Committee, August 2015  Bring CPAP mask and tubing day of surgery.                              You may not have any metal on your body including  jewelry, and body piercing             Do not wear lotions, powders, cologne, or deodorant              Men may shave face and neck.   Do not bring valuables to the hospital. [facility] IS NOT RESPONSIBLE   FOR VALUABLES.   Contacts, dentures or bridgework may not be worn into surgery.  DO NOT BRING YOUR HOME MEDICATIONS TO THE HOSPITAL. PHARMACY WILL DISPENSE MEDICATIONS LISTED ON YOUR MEDICATION LIST TO YOU DURING YOUR ADMISSION IN THE HOSPITAL!    Patients discharged on the day of surgery will not be allowed to drive home.  Someone NEEDS to stay with you for the first 24 hours after anesthesia.   Special Instructions: Bring a copy of your healthcare power of attorney and living will documents the day of surgery if you haven't scanned them before.              Please read over the following fact sheets you were given: IF YOU HAVE QUESTIONS ABOUT YOUR PRE-OP INSTRUCTIONS PLEASE CALL [phone]  If you received a COVID test during your pre-op visit  it is requested that you wear a mask when out in public, stay away from anyone that may not be feeling well and notify your surgeon if you develop symptoms. If you test positive for Covid or have been in contact with anyone that has tested positive in the last 10 days please notify you surgeon.  [place] - Preparing for Surgery Before surgery, you can play an important role.  Because skin is not sterile, your skin needs to be as free of germs as possible.  You can reduce the number of germs on your skin by washing with CHG (chlorahexidine gluconate) or an antibacterial soap before  surgery.  CHG is an antiseptic cleaner which kills germs and bonds with the skin to continue killing germs even after washing. Please DO NOT use if you have an allergy to CHG or antibacterial soaps.  If your skin becomes reddened/irritated stop using the CHG and inform your nurse when you arrive at Short Stay. Do not shave (including  legs and underarms) for at least 48 hours prior to the first CHG shower.  You may shave your face/neck.  Please follow these instructions carefully:  1.  Shower with CHG Soap or antibacterial soap the night before surgery and the  morning of surgery.  2.  If you choose to wash your hair, wash your hair first as usual with your normal  shampoo.  3.  After you shampoo, rinse your hair and body thoroughly to remove the shampoo.                             4.  Use CHG as you would any other liquid soap.  You can apply chg directly to the skin and wash.  Gently with a scrungie or clean washcloth.  5.  Apply the CHG Soap to your body ONLY FROM THE NECK DOWN.   Do   not use on face/ open                           Wound or open sores. Avoid contact with eyes, ears mouth and   genitals (private parts).                       Wash face,  Genitals (private parts) with your normal soap.             6.  Wash thoroughly, paying special attention to the area where your    surgery  will be performed.  7.  Thoroughly rinse your body with warm water  from the neck down.  8.  DO NOT shower/wash with your normal soap after using and rinsing off the CHG Soap.                9.  Pat yourself dry with a clean towel.            10.  Wear clean pajamas.            11.  Place clean sheets on your bed the night of your first shower and do not  sleep with pets. Day of Surgery : Do not apply any lotions/deodorants the morning of surgery.  Please wear clean clothes to the hospital/surgery center.  FAILURE TO FOLLOW THESE INSTRUCTIONS MAY RESULT IN THE CANCELLATION OF YOUR SURGERY  PATIENT  SIGNATURE_________________________________  NURSE SIGNATURE__________________________________  ________________________________________________________________________

## 2024-03-13 NOTE — Progress Notes (Signed)
 Patient phoned to give updated information on surgery, this is second stage surgery.  Date of Surgery - 06-24-23  Arrival Time - 7:15 and check in at admitting.    NPO Status - patient reminded to not eat solid food or drink liquids after midnight.   Medications morning of surgery - Qulipta , Carvedilol , Gabapentin , Topiramate , Omeprazole , Sertraline , Tamsulosin .  If needed Oxycodone , Ondansetron   Patient reminded to hold Aspirin  5 days prior to surgery, hold Jardiance 3 days before surgery and Ozempic 7 days before surgery.  No change in medical history, allergies per patient.  Transportation home - [facility] [phone]  All questions answered and patient stated understanding.  Instructions also sent to patient through MyChart
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
## 2024-03-22 ENCOUNTER — Encounter (HOSPITAL_COMMUNITY): Payer: Self-pay | Admitting: Urology

## 2024-03-22 NOTE — Anesthesia Preprocedure Evaluation (Signed)
 Anesthesia Evaluation  Patient identified by MRN, date of birth, ID band Patient awake    Reviewed: Allergy & Precautions, NPO status , Patient's Chart, lab work & pertinent test results, reviewed documented beta blocker date and time   Airway Mallampati: III  TM Distance: >3 FB Neck ROM: Full    Dental  (+) Missing, Dental Advisory Given, Chipped,    Pulmonary asthma , sleep apnea , former smoker   Pulmonary exam normal breath sounds clear to auscultation       Cardiovascular hypertension, Pt. on medications + CAD and +CHF  Normal cardiovascular exam+ Valvular Problems/Murmurs AI  Rhythm:Regular Rate:Normal  Hx/o Severe AI S/P AVR  EKG 04/09/23 Sinus rhythm with 1st degree A-V block Minimal voltage criteria for LVH, may be normal variant ( Cornell product ) Anteroseptal infarct , age undetermined  Echo 12/29/18 1. The left ventricle has mildly reduced systolic function, with an  ejection fraction of 45-50%. The cavity size was normal. Left ventricular  diastolic parameters were normal. There is abnormal septal motion  consistent with post-operative status.   2. The right ventricle has normal systolc function. There is no increase  in right ventricular wall thickness.   3. Trivial circumferential pericardial effusion is present.   4. A 25mm an Edwards bovine bioprosthesis valve is present in the aortic  position. Procedure Date: 12/16/2018 Normal aortic valve prosthesis. Mean  PG 13 mmHg, which is likely normal. No regurgitation.   5. The ascending aorta is normal in size and structure.   6. Compared to previous transthoracic study on 08/27/2018, bioprosthetic  aortic valve is new.   Cardiac Cath 10/14/18 Angiographically normal coronary arteries with no significant CAD Mild pulmonary hypertnension mean PA 26 mmHg       Neuro/Psych  PSYCHIATRIC DISORDERS  Depression    negative neurological ROS     GI/Hepatic Neg  liver ROS,GERD  Medicated,,  Endo/Other  diabetes, Well Controlled, Type 2  Class 3 obesityHLD GLP-1 RA therapy- last dose  Renal/GU Renal InsufficiencyRenal diseaseBilateral renal calculi  negative genitourinary   Musculoskeletal  (+) Arthritis , Osteoarthritis,    Abdominal  (+) + obese  Peds  Hematology  (+) Blood dyscrasia, anemia   Anesthesia Other Findings   Reproductive/Obstetrics                              Anesthesia Physical Anesthesia Plan  ASA: 3  Anesthesia Plan: General   Post-op Pain Management: Dilaudid  IV, Precedex  and Tylenol  PO (pre-op)*   Induction: Intravenous  PONV Risk Score and Plan: 4 or greater and Treatment may vary due to age or medical condition, Ondansetron  and Dexamethasone   Airway Management Planned: LMA  Additional Equipment: None  Intra-op Plan:   Post-operative Plan: Extubation in OR  Informed Consent: I have reviewed the patients History and Physical, chart, labs and discussed the procedure including the risks, benefits and alternatives for the proposed anesthesia with the patient or authorized representative who has indicated his/her understanding and acceptance.     Dental advisory given  Plan Discussed with: CRNA and Anesthesiologist  Anesthesia Plan Comments:          Anesthesia Quick Evaluation

## 2024-03-23 ENCOUNTER — Other Ambulatory Visit: Payer: Self-pay

## 2024-03-23 ENCOUNTER — Ambulatory Visit (HOSPITAL_COMMUNITY): Admitting: Anesthesiology

## 2024-03-23 ENCOUNTER — Ambulatory Visit (HOSPITAL_COMMUNITY)

## 2024-03-23 ENCOUNTER — Encounter (HOSPITAL_COMMUNITY): Admitting: Anesthesiology

## 2024-03-23 ENCOUNTER — Encounter (HOSPITAL_COMMUNITY): Payer: Self-pay | Admitting: Urology

## 2024-03-23 ENCOUNTER — Ambulatory Visit (HOSPITAL_COMMUNITY)
Admission: RE | Admit: 2024-03-23 | Discharge: 2024-03-23 | Disposition: A | Discharge status: Home / Self Care | Attending: Urology | Admitting: Urology

## 2024-03-23 ENCOUNTER — Encounter (HOSPITAL_COMMUNITY)
Admission: RE | Disposition: A | Discharge status: Home / Self Care | Payer: Self-pay | Source: Home / Self Care | Attending: Urology

## 2024-03-23 DIAGNOSIS — K219 Gastro-esophageal reflux disease without esophagitis: Secondary | ICD-10-CM | POA: Diagnosis not present

## 2024-03-23 DIAGNOSIS — G4733 Obstructive sleep apnea (adult) (pediatric): Secondary | ICD-10-CM | POA: Insufficient documentation

## 2024-03-23 DIAGNOSIS — E66813 Obesity, class 3: Secondary | ICD-10-CM | POA: Insufficient documentation

## 2024-03-23 DIAGNOSIS — I10 Essential (primary) hypertension: Secondary | ICD-10-CM | POA: Diagnosis not present

## 2024-03-23 DIAGNOSIS — I2510 Atherosclerotic heart disease of native coronary artery without angina pectoris: Secondary | ICD-10-CM

## 2024-03-23 DIAGNOSIS — Z87891 Personal history of nicotine dependence: Secondary | ICD-10-CM | POA: Insufficient documentation

## 2024-03-23 DIAGNOSIS — N189 Chronic kidney disease, unspecified: Secondary | ICD-10-CM | POA: Insufficient documentation

## 2024-03-23 DIAGNOSIS — I130 Hypertensive heart and chronic kidney disease with heart failure and stage 1 through stage 4 chronic kidney disease, or unspecified chronic kidney disease: Secondary | ICD-10-CM | POA: Insufficient documentation

## 2024-03-23 DIAGNOSIS — Z7985 Long-term (current) use of injectable non-insulin antidiabetic drugs: Secondary | ICD-10-CM | POA: Insufficient documentation

## 2024-03-23 DIAGNOSIS — E119 Type 2 diabetes mellitus without complications: Secondary | ICD-10-CM

## 2024-03-23 DIAGNOSIS — E1122 Type 2 diabetes mellitus with diabetic chronic kidney disease: Secondary | ICD-10-CM | POA: Insufficient documentation

## 2024-03-23 DIAGNOSIS — Z6834 Body mass index (BMI) 34.0-34.9, adult: Secondary | ICD-10-CM | POA: Insufficient documentation

## 2024-03-23 DIAGNOSIS — I509 Heart failure, unspecified: Secondary | ICD-10-CM | POA: Diagnosis not present

## 2024-03-23 DIAGNOSIS — N200 Calculus of kidney: Secondary | ICD-10-CM | POA: Insufficient documentation

## 2024-03-23 DIAGNOSIS — D649 Anemia, unspecified: Secondary | ICD-10-CM

## 2024-03-23 DIAGNOSIS — J45909 Unspecified asthma, uncomplicated: Secondary | ICD-10-CM | POA: Insufficient documentation

## 2024-03-23 DIAGNOSIS — E785 Hyperlipidemia, unspecified: Secondary | ICD-10-CM | POA: Insufficient documentation

## 2024-03-23 HISTORY — PX: CYSTOSCOPY/URETEROSCOPY/HOLMIUM LASER/STENT PLACEMENT: SHX6546

## 2024-03-23 HISTORY — DX: Anemia, unspecified: D64.9

## 2024-03-23 LAB — BASIC METABOLIC PANEL WITH GFR
Anion gap: 12 (ref 5–15)
BUN: 12 mg/dL (ref 8–23)
CO2: 25 mmol/L (ref 22–32)
Calcium: 9.5 mg/dL (ref 8.9–10.3)
Chloride: 106 mmol/L (ref 98–111)
Creatinine, Ser: 1.26 mg/dL — ABNORMAL HIGH (ref 0.61–1.24)
GFR, Estimated: >60 mL/min
Glucose, Bld: 115 mg/dL — ABNORMAL HIGH (ref 70–99)
Potassium: 3.8 mmol/L (ref 3.5–5.1)
Sodium: 143 mmol/L (ref 135–145)

## 2024-03-23 LAB — CBC
HCT: 47.9 % (ref 39.0–52.0)
Hemoglobin: 14.7 g/dL (ref 13.0–17.0)
MCH: 26.9 pg (ref 26.0–34.0)
MCHC: 30.7 g/dL (ref 30.0–36.0)
MCV: 87.7 fL (ref 80.0–100.0)
Platelets: 236 10*3/uL (ref 150–400)
RBC: 5.46 MIL/uL (ref 4.22–5.81)
RDW: 13.7 % (ref 11.5–15.5)
WBC: 3.7 10*3/uL — ABNORMAL LOW (ref 4.0–10.5)
nRBC: 0 % (ref 0.0–0.2)

## 2024-03-23 SURGERY — CYSTOSCOPY/URETEROSCOPY/HOLMIUM LASER/STENT PLACEMENT
Anesthesia: General | Laterality: Right

## 2024-03-23 MED ORDER — PROPOFOL 10 MG/ML IV BOLUS
INTRAVENOUS | Status: DC | PRN
  Administered 2024-03-23: 130 mg via INTRAVENOUS

## 2024-03-23 MED ORDER — OXYCODONE HCL 5 MG/5ML PO SOLN: 5.0000 mg | Freq: Once | ORAL | Status: DC | PRN

## 2024-03-23 MED ORDER — MIDAZOLAM HCL (PF) 2 MG/2ML IJ SOLN
INTRAMUSCULAR | Status: DC | PRN
  Administered 2024-03-23: 2 mg via INTRAVENOUS

## 2024-03-23 MED ORDER — OXYCODONE HCL 5 MG PO TABS: 5.0000 mg | ORAL_TABLET | Freq: Once | ORAL | Status: DC | PRN

## 2024-03-23 MED ORDER — ONDANSETRON HCL 4 MG/2ML IJ SOLN: 4.0000 mg | Freq: Once | INTRAMUSCULAR | Status: DC | PRN

## 2024-03-23 MED ORDER — PHENYLEPHRINE 80 MCG/ML (10ML) SYRINGE FOR IV PUSH (FOR BLOOD PRESSURE SUPPORT)
PREFILLED_SYRINGE | INTRAVENOUS | Status: AC
  Filled 2024-03-23: qty 10

## 2024-03-23 MED ORDER — OXYCODONE-ACETAMINOPHEN 5-325 MG PO TABS: 1.0000 | ORAL_TABLET | ORAL | 0 refills | Status: AC | PRN

## 2024-03-23 MED ORDER — DEXAMETHASONE SOD PHOSPHATE PF 10 MG/ML IJ SOLN
INTRAMUSCULAR | Status: DC | PRN
Start: 2024-03-23 — End: 2024-03-23
  Administered 2024-03-23: 10 mg via INTRAVENOUS

## 2024-03-23 MED ORDER — CHLORHEXIDINE GLUCONATE 0.12 % MT SOLN
15.0000 mL | Freq: Once | OROMUCOSAL | Status: AC
  Administered 2024-03-23: 15 mL via OROMUCOSAL

## 2024-03-23 MED ORDER — SUGAMMADEX SODIUM 200 MG/2ML IV SOLN
INTRAVENOUS | Status: AC
Start: 2024-03-23 — End: 2024-03-23
  Filled 2024-03-23: qty 2

## 2024-03-23 MED ORDER — PROPOFOL 10 MG/ML IV BOLUS
INTRAVENOUS | Status: AC
  Filled 2024-03-23: qty 20

## 2024-03-23 MED ORDER — LACTATED RINGERS IV SOLN: INTRAVENOUS | Status: DC

## 2024-03-23 MED ORDER — CIPROFLOXACIN IN D5W 400 MG/200ML IV SOLN
400.0000 mg | INTRAVENOUS | Status: AC
  Administered 2024-03-23: 400 mg via INTRAVENOUS
  Filled 2024-03-23: qty 200

## 2024-03-23 MED ORDER — HYDROMORPHONE HCL 1 MG/ML IJ SOLN: 0.2500 mg | INTRAMUSCULAR | Status: DC | PRN

## 2024-03-23 MED ORDER — ONDANSETRON HCL 4 MG/2ML IJ SOLN
INTRAMUSCULAR | Status: AC
  Filled 2024-03-23: qty 2

## 2024-03-23 MED ORDER — FENTANYL CITRATE (PF) 250 MCG/5ML IJ SOLN
INTRAMUSCULAR | Status: DC | PRN
  Administered 2024-03-23: 100 ug via INTRAVENOUS

## 2024-03-23 MED ORDER — ORAL CARE MOUTH RINSE: 15.0000 mL | Freq: Once | OROMUCOSAL | Status: AC

## 2024-03-23 MED ORDER — MIDAZOLAM HCL 2 MG/2ML IJ SOLN
INTRAMUSCULAR | Status: AC
  Filled 2024-03-23: qty 2

## 2024-03-23 MED ORDER — DROPERIDOL 2.5 MG/ML IJ SOLN: 0.6250 mg | Freq: Once | INTRAMUSCULAR | Status: DC | PRN

## 2024-03-23 MED ORDER — ONDANSETRON HCL 4 MG/2ML IJ SOLN
INTRAMUSCULAR | Status: DC | PRN
  Administered 2024-03-23: 4 mg via INTRAVENOUS

## 2024-03-23 MED ORDER — SODIUM CHLORIDE 0.9 % IR SOLN
Status: DC | PRN
  Administered 2024-03-23: 3000 mL via INTRAVESICAL

## 2024-03-23 MED ORDER — IOHEXOL 300 MG/ML  SOLN
INTRAMUSCULAR | Status: DC | PRN
  Administered 2024-03-23: 10 mL

## 2024-03-23 MED ORDER — FENTANYL CITRATE (PF) 100 MCG/2ML IJ SOLN
INTRAMUSCULAR | Status: AC
  Filled 2024-03-23: qty 2

## 2024-03-23 MED ORDER — LIDOCAINE 2% (20 MG/ML) 5 ML SYRINGE
INTRAMUSCULAR | Status: DC | PRN
  Administered 2024-03-23: 100 mg via INTRAVENOUS

## 2024-03-23 SURGICAL SUPPLY — 24 items
BAG URO CATCHER STRL LF (MISCELLANEOUS) ×1 IMPLANT
BASKET ZERO TIP NITINOL 2.4FR (BASKET) IMPLANT
BENZOIN TINCTURE PRP APPL 2/3 (GAUZE/BANDAGES/DRESSINGS) IMPLANT
CATH URETERAL DUAL LUMEN 10F (MISCELLANEOUS) IMPLANT
CATH URETL OPEN END 6FR 70 (CATHETERS) IMPLANT
CLOTH BEACON ORANGE TIMEOUT ST (SAFETY) ×1 IMPLANT
DRSG TEGADERM 2-3/8X2-3/4 SM (GAUZE/BANDAGES/DRESSINGS) IMPLANT
FIBER LASER MOSES 200 DFL (Laser) IMPLANT
GLOVE BIOGEL M 7.0 STRL (GLOVE) ×1 IMPLANT
GOWN STRL REUS W/ TWL XL LVL3 (GOWN DISPOSABLE) ×1 IMPLANT
GUIDEWIRE STR DUAL SENSOR (WIRE) ×2 IMPLANT
GUIDEWIRE ZIPWRE .038 STRAIGHT (WIRE) IMPLANT
KIT TURNOVER KIT A (KITS) ×1 IMPLANT
MANIFOLD NEPTUNE II (INSTRUMENTS) ×1 IMPLANT
PACK CYSTO (CUSTOM PROCEDURE TRAY) ×1 IMPLANT
PAD PREP 24X48 CUFFED NSTRL (MISCELLANEOUS) ×1 IMPLANT
SHEATH DILATOR SET 8/10 (MISCELLANEOUS) IMPLANT
SHEATH NAV HD 11/13X46 (SHEATH) IMPLANT
SHEATH NAVIGATOR HD 11/13X28 (SHEATH) IMPLANT
SHEATH NAVIGATOR HD 11/13X36 (SHEATH) IMPLANT
STENT URET 6FRX28 CONTOUR (STENTS) IMPLANT
TRACTIP FLEXIVA PULS ID 200XHI (Laser) IMPLANT
TUBING CONNECTING 10 (TUBING) ×1 IMPLANT
TUBING UROLOGY SET (TUBING) ×1 IMPLANT

## 2024-03-23 NOTE — Discharge Instructions (Signed)
 [facility] [phone] Post Ureteroscopy With or Without Stent Instructions  Definitions:  Ureter: The duct that transports urine from the kidney to the bladder. Stent:   A plastic hollow tube that is placed into the ureter, from the kidney to the bladder to prevent the ureter from swelling shut.  GENERAL INSTRUCTIONS:  Despite the fact that no skin incisions were used, the area around the ureter and bladder is raw and irritated. The stent is a foreign body which will further irritate the bladder wall. This irritation is manifested by increased frequency of urination, both day and night, and by an increase in the urge to urinate. In some, the urge to urinate is present almost always. Sometimes the urge is strong enough that you may not be able to stop yourself from urinating. The only real cure is to remove the stent and then give time for the bladder wall to heal which can't be done until the danger of the ureter swelling shut has passed, which varies.  You may see some blood in your urine while the stent is in place and a few days afterwards. Do not be alarmed, even if the urine was clear for a while. Get off your feet and drink lots of fluids until clearing occurs. If you start to pass clots or don't improve, call us .  DIET: You may return to your normal diet immediately. Because of the raw surface of your bladder, alcohol, spicy foods, acid type foods and drinks with caffeine may cause irritation or frequency and should be used in moderation. To keep your urine flowing freely and to avoid constipation, drink plenty of fluids during the day ( 8-10 glasses ). Tip: Avoid cranberry juice because it is very acidic.  ACTIVITY: Your physical activity doesn't need to be restricted. However, if you are very active, you may see some blood in your urine. We suggest that you reduce your activity under these circumstances until the bleeding has stopped.  BOWELS: It is important to  keep your bowels regular during the postoperative period. Straining with bowel movements can cause bleeding. A bowel movement every other day is reasonable. Use a mild laxative if needed, such as Milk of Magnesia 2-3 tablespoons, or 2 Dulcolax tablets. Call if you continue to have problems. If you have been taking narcotics for pain, before, during or after your surgery, you may be constipated. Take a laxative if necessary.   MEDICATION: You should resume your pre-surgery medications unless told not to. In addition you will often be given an antibiotic to prevent infection. These should be taken as prescribed until the bottles are finished unless you are having an unusual reaction to one of the drugs.  PROBLEMS YOU SHOULD REPORT TO US : Fevers over 100.5 Fahrenheit. Heavy bleeding, or clots ( See above notes about blood in urine ). Inability to urinate. Drug reactions ( hives, rash, nausea, vomiting, diarrhea ). Severe burning or pain with urination that is not improving.  FOLLOW-UP: You will need a follow-up appointment to monitor your progress. Call for this appointment at the number listed above. Usually the first appointment will be about three to fourteen days after your surgery.  You may remove stents on Thursday AM by pulling on attached strings.

## 2024-03-23 NOTE — Anesthesia Procedure Notes (Signed)
 Procedure Name: LMA Insertion Date/Time: 03/23/2024 9:39 AM  Performed by: Nanci Riis, CRNAPre-anesthesia Checklist: Patient identified, Emergency Drugs available and Suction available Patient Re-evaluated:Patient Re-evaluated prior to induction Oxygen Delivery Method: Circle system utilized Preoxygenation: Pre-oxygenation with 100% oxygen Induction Type: IV induction LMA: LMA inserted LMA Size: 4.0 Number of attempts: 1 Placement Confirmation: positive ETCO2 and breath sounds checked- equal and bilateral Tube secured with: Tape Dental Injury: Teeth and Oropharynx as per pre-operative assessment

## 2024-03-23 NOTE — Op Note (Signed)
 Operative Note   Preoperative diagnosis:  1.  Bilateral renal stone   Postoperative diagnosis: 1.  Bilateral renal stones   Procedure(s): 1.  Cystoscopy 2.  2nd stage bilateral ureteroscopy with laser lithotripsy and basket extraction of stones 3.  Bilateral retrograde pyelogram 4.  Bilateral ureteral stent exchange 5. Fluoroscopy with intraoperative interpretation   Surgeon: Donnice Siad, MD   Assistants:  None   Anesthesia:  General   Complications:  None   EBL:  Minimal   Specimens: 1. None   Drains/Catheters: 1.  Right 6Fr x 28cm ureteral stent with tether string 2. Left 6Fr x 28cm ureteral stent with tether string   Intraoperative findings:   Cystoscopy demonstrated no suspicious bladder lesions. Right retrograde pyelogram with no hydronephrosis, no filling defects, no extravasation of contrast. Left retrograde pyelogram with medial deviation of the left ureter, no hydronephrosis, no extravasation of contrast, no filling defects. Right ureteroscopy demonstrated a small right lower pole stone that was fragmented and basketed. Left ureteroscopy demonstrated a left upper and middle pole stone and large left lower pole stones that were fragmented and basketed. Successful stent placement.   Indication:  Charles Riggs. is a 68 y.o. male with bilateral renal stones here for for staged bilateral ureteroscopy with laser lithotripsy and basket traction of stones.   Description of procedure: After informed consent was obtained from the patient, the patient was identified and taken to the operating room and placed in the supine position.  General anesthesia was administered as well as perioperative IV antibiotics.  At the beginning of the case, a time-out was performed to properly identify the patient, the surgery to be performed, and the surgical site.  Sequential compression devices were applied to the lower extremities at the beginning of the case for DVT prophylaxis.  The  patient was then placed in the dorsal lithotomy supine position, prepped and draped in sterile fashion.   We then passed the 21-French rigid cystoscope through the urethra and into the bladder under vision without any difficulty, noting a normal urethra without strictures and a mildly obstructing prostate.  A systematic evaluation of the bladder revealed no evidence of any suspicious bladder lesions.  Ureteral orifices were in normal position.    Bilateral stents were removed and sensor wires placed in each kidney.   Under cystoscopic and flouroscopic guidance, we cannulated the right ureteral orifice with a 5-French open-ended ureteral catheter and a gentle retrograde pyelogram was performed, revealing a normal caliber ureter without any filling defects. There was no hydronephrosis of the collecting system. A 0.038 sensor wire was then passed up to the level of the renal pelvis and secured to the drape as a safety wire. The ureteral catheter and cystoscope were removed, leaving the safety wire in place.    A semi-rigid ureteroscope was passed alongside the wire up the distal ureter which appeared normal. A second 0.038 sensor wire was passed under direct vision and the semirigid scope was removed. The flexible ureteroscope was advanced into the collecting system. The collecting system was inspected. Using the 272 micron holmium laser fiber, the right lower pole stone was fragmented.  2.2 Fr zero tip basket was used to remove two small fragments under visual guidance. With the ureteroscope in the kidney, a gentle pyelogram was performed to delineate the calyceal system and we evaluated the calyces systematically. We encountered small dust particles. The rest of the stone fragments were very tiny and these were  irrigated away gently. The calyces were  re-inspected and there were no significant stone fragment residual.    We then withdrew the ureteroscope back down the ureter, noting no evidence of any  stones along the course of the ureter.  Prior to removing the ureteroscope, we did pass the Glidewire back up to the ureter to the renal pelvis.  Once the ureteroscope was removed, we then used the Glidewire under fluoroscopic guidance and passed up a 6-French x 28 cm double-pigtail ureteral stent up the ureter, making sure that the proximal and distal ends coiled within the kidney and bladder respectively. I left a tether string to the right stent.    Under cystoscopic and flouroscopic guidance, we cannulated the left ureteral orifice with a 5-French open-ended ureteral catheter and a gentle retrograde pyelogram was performed, revealing a normal caliber ureter without any filling defects. There was no hydronephrosis of the collecting system. A 0.038 sensor wire was then passed up to the level of the renal pelvis and secured to the drape as a safety wire. The ureteral catheter and cystoscope were removed, leaving the safety wire in place.    A semi-rigid ureteroscope was passed alongside the wire up the distal ureter which appeared normal. A second 0.038 sensor wire was passed under direct vision and the semirigid scope was removed. The flexible ureteroscope was advanced into the collecting system. The collecting system was inspected. The calculus was identified at the left upper pole, interpole and lower pole calyx. Using the 272 micron holmium laser fiber, the stone was fragmented completely. With the ureteroscope in the kidney, a gentle pyelogram was performed to delineate the calyceal system and we evaluated the calyces systematically. There were no more fragments.   We then withdrew the ureteroscope back down the ureter noting no evidence of any stones along the course of the ureter.  Prior to removing the ureteroscope, we did pass the Glidewire back up to the ureter to the renal pelvis.      Once the ureteroscope was removed, the Glidewire was backloaded through the rigid cystoscope, which was then  advanced down the urethra and into the bladder. We then used the Glidewire under direct vision through the rigid cystoscope and under fluoroscopic guidance and passed up a 6-French, 28 cm double-pigtail ureteral stent up ureter, making sure that the proximal and distal ends coiled within the kidney and bladder respectively. Tether strings were left attached to both stents.    The cystoscope was then advanced back into the bladder under vision.  We were able to see the distal stent coiling nicely within the bladder.  The bladder was then emptied with irrigation solution.  The cystoscope was then removed.     The patient tolerated the procedure well and there was no complication. Patient was awoken from anesthesia and taken to the recovery room in stable condition. I was present and scrubbed for the entirety of the case.   Plan:  Patient will be discharged home and will remove stents on Thursday.   Charles R. Olimpia Tinch MD [facility]  Pager: [phone]

## 2024-03-23 NOTE — Transfer of Care (Signed)
 Immediate Anesthesia Transfer of Care Note  Patient: Charles Riggs.  Procedure(s) Performed: CYSTOSCOPY/URETEROSCOPY/HOLMIUM LASER/STENT PLACEMENT (Right)  Patient Location: PACU  Anesthesia Type:General  Level of Consciousness: drowsy and patient cooperative  Airway & Oxygen Therapy: Patient Spontanous Breathing and Patient connected to face mask oxygen  Post-op Assessment: Report given to RN and Post -op Vital signs reviewed and stable  Post vital signs: Reviewed and stable  Last Vitals:  Vitals Value Taken Time  BP 164/103 03/23/24 11:17  Temp 36.4 C 03/23/24 11:17  Pulse 68 03/23/24 11:19  Resp 15 03/23/24 11:19  SpO2 100 % 03/23/24 11:19  Vitals shown include unfiled device data.  Last Pain:  Vitals:   03/23/24 1117  TempSrc:   PainSc: 0-No pain      Patients Stated Pain Goal: 0 (03/23/24 0758)  Complications: No notable events documented.

## 2024-03-23 NOTE — Anesthesia Postprocedure Evaluation (Signed)
 Anesthesia Post Note  Patient: Charles Riggs.  Procedure(s) Performed: CYSTOSCOPY/URETEROSCOPY/HOLMIUM LASER/STENT PLACEMENT (Right)     Patient location during evaluation: PACU Anesthesia Type: General Level of consciousness: awake and alert and oriented Pain management: pain level controlled Vital Signs Assessment: post-procedure vital signs reviewed and stable Respiratory status: spontaneous breathing, nonlabored ventilation and respiratory function stable Cardiovascular status: blood pressure returned to baseline and stable Postop Assessment: no apparent nausea or vomiting Anesthetic complications: no   No notable events documented.  Last Vitals:  Vitals:   03/23/24 1117 03/23/24 1130  BP: (!) 164/103 (!) 120/99  Pulse: 70 67  Resp: 19 12  Temp: (!) 36.4 C   SpO2: 100% 100%    Last Pain:  Vitals:   03/23/24 1117  TempSrc:   PainSc: 0-No pain                 Rosmery Duggin A.

## 2024-03-23 NOTE — OR Nursing (Signed)
 STONES TAKEN BY DR. SELMA

## 2024-03-24 ENCOUNTER — Encounter (HOSPITAL_COMMUNITY): Payer: Self-pay | Admitting: Urology

## 2024-03-30 ENCOUNTER — Ambulatory Visit (HOSPITAL_COMMUNITY)
Admission: RE | Admit: 2024-03-30 | Discharge: 2024-03-30 | Disposition: A | Discharge status: Home / Self Care | Payer: Non-veteran care | Source: Ambulatory Visit | Attending: Cardiology | Admitting: Cardiology

## 2024-03-30 DIAGNOSIS — Z952 Presence of prosthetic heart valve: Secondary | ICD-10-CM | POA: Insufficient documentation

## 2024-03-30 DIAGNOSIS — I10 Essential (primary) hypertension: Secondary | ICD-10-CM | POA: Diagnosis present

## 2024-03-30 LAB — ECHOCARDIOGRAM COMPLETE
AR max vel: 1.95 cm2
AV Area VTI: 2.15 cm2
AV Area mean vel: 1.99 cm2
AV Mean grad: 9.7 mmHg
AV Peak grad: 18.4 mmHg
Ao pk vel: 2.15 m/s
Area-P 1/2: 2.88 cm2
S' Lateral: 2.7 cm

## 2024-03-31 NOTE — Progress Notes (Signed)
 New Patient Pulmonology Office Visit   Subjective:  Patient ID: Charles Riggs., male    DOB: 10/05/1955  MRN: [id number]  Referred by: Tobie Mis, FNP  CC: No chief complaint on file.   HPI [name]. is a 68 y.o. male with former smoker, OSA, LBBB, CHF s/p bioprosthetic aortic valve replacement 2020, CHF, CKD, stage II  Asthma?   {PULM QUESTIONNAIRES (Optional):33196}  ROS  Allergies: Patient has no known allergies.  Current Outpatient Medications:    albuterol  (PROVENTIL  HFA;VENTOLIN  HFA) 108 (90 Base) MCG/ACT inhaler, Inhale 2 puffs into the lungs every 6 (six) hours as needed for wheezing or shortness of breath., Disp: , Rfl:    albuterol  (PROVENTIL ) (2.5 MG/3ML) 0.083% nebulizer solution, Take 3 mLs (2.5 mg total) by nebulization every 6 (six) hours as needed for wheezing or shortness of breath., Disp: 75 mL, Rfl: 12   amLODipine  (NORVASC ) 5 MG tablet, Take 1 tablet (5 mg total) by mouth daily. (Patient not taking: Reported on 02/26/2024), Disp: 90 tablet, Rfl: 3   aspirin  EC 81 MG tablet, Take 1 tablet (81 mg total) by mouth daily., Disp: 90 tablet, Rfl: 1   Atogepant  (QULIPTA ) 60 MG TABS, Take 1 tablet (60 mg total) by mouth daily., Disp: 30 tablet, Rfl: 11   azithromycin  (ZITHROMAX ) 250 MG tablet, Take 1 tablet (250 mg total) by mouth daily. Take first 2 tablets together, then 1 every day until finished. (Patient not taking: Reported on 02/26/2024), Disp: 6 tablet, Rfl: 0   baclofen (LIORESAL) 10 MG tablet, Take 10 mg by mouth at bedtime as needed for muscle spasms., Disp: , Rfl:    budesonide -formoterol  (SYMBICORT ) 160-4.5 MCG/ACT inhaler, Inhale 2 puffs into the lungs 2 (two) times daily as needed (shortness of breath)., Disp: , Rfl:    carvedilol  (COREG ) 6.25 MG tablet, Take 6.25 mg by mouth 2 (two) times daily with a meal., Disp: , Rfl:    celecoxib (CELEBREX) 100 MG capsule, Take 100 mg by mouth daily as needed for moderate pain., Disp: , Rfl:     Cholecalciferol (VITAMIN D3) 50 MCG (2000 UT) TABS, Take 2,000 Units by mouth 2 (two) times daily., Disp: , Rfl:    diclofenac Sodium (VOLTAREN) 1 % GEL, Apply 2 g topically daily as needed (pain)., Disp: , Rfl:    docusate sodium  (COLACE) 100 MG capsule, Take 1 capsule (100 mg total) by mouth daily as needed for up to 30 doses. (Patient not taking: Reported on 02/26/2024), Disp: 30 capsule, Rfl: 0   empagliflozin (JARDIANCE) 25 MG TABS tablet, Take 12.5 mg by mouth daily., Disp: , Rfl:    ferrous sulfate 325 (65 FE) MG tablet, Take 325 mg by mouth daily as needed., Disp: , Rfl:    fluticasone  (FLONASE ) 50 MCG/ACT nasal spray, Place 2 sprays into both nostrils daily as needed for allergies or rhinitis., Disp: , Rfl:    Fluticasone -Salmeterol (ADVAIR) 250-50 MCG/DOSE AEPB, Inhale 1 puff into the lungs in the morning and at bedtime., Disp: , Rfl:    gabapentin  (NEURONTIN ) 300 MG capsule, Take 300 mg by mouth 2 (two) times daily as needed (pain)., Disp: , Rfl:    guaiFENesin  (MUCINEX ) 600 MG 12 hr tablet, Take 1,200 mg by mouth 2 (two) times daily., Disp: , Rfl:    lidocaine  (LIDODERM ) 5 %, Place 1 patch onto the skin daily. Remove & Discard patch within 12 hours or as directed by MD (Patient taking differently: Place 1 patch onto the skin  daily as needed (pain). Remove & Discard patch within 12 hours or as directed by MD), Disp: 30 patch, Rfl: 0   losartan  (COZAAR ) 50 MG tablet, Take 1 tablet (50 mg total) by mouth daily. (Patient not taking: Reported on 02/26/2024), Disp: 90 tablet, Rfl: 3   omeprazole  (PRILOSEC) 40 MG capsule, Take 40 mg by mouth daily as needed (acid reflux)., Disp: , Rfl:    ondansetron  (ZOFRAN ) 4 MG tablet, Take 1 tablet (4 mg total) by mouth every 6 (six) hours. (Patient taking differently: Take 4 mg by mouth every 8 (eight) hours as needed for nausea or vomiting.), Disp: 15 tablet, Rfl: 0   oxyCODONE -acetaminophen  (PERCOCET) 5-325 MG tablet, Take 1 tablet by mouth every 4 (four)  hours as needed for up to 12 doses for severe pain (pain score 7-10)., Disp: 12 tablet, Rfl: 0   polycarbophil (FIBERCON) 625 MG tablet, Take 625 mg by mouth in the morning and at bedtime., Disp: , Rfl:    rosuvastatin  (CRESTOR ) 20 MG tablet, Take 10 mg by mouth at bedtime., Disp: , Rfl:    Semaglutide, 2 MG/DOSE, 8 MG/3ML SOPN, Inject 2 mg into the skin every 7 (seven) days., Disp: , Rfl:    sertraline  (ZOLOFT ) 100 MG tablet, Take 100 mg by mouth daily., Disp: , Rfl:    tamsulosin  (FLOMAX ) 0.4 MG CAPS capsule, Take 1 capsule (0.4 mg total) by mouth daily., Disp: 30 capsule, Rfl: 0   terbinafine (LAMISIL) 1 % cream, Apply 1 Application topically daily as needed (irritation)., Disp: , Rfl:    topiramate  (TOPAMAX ) 50 MG tablet, Take 50 mg by mouth 2 (two) times daily., Disp: , Rfl:    Ubrogepant  (UBRELVY ) 100 MG TABS, Take 1 tablet (100 mg total) by mouth as needed (May repeat in 2 hours if needed.  Maximum 2 tablets in 24 hours.)., Disp: 10 tablet, Rfl: 11 Past Medical History:  Diagnosis Date   Anemia    Aortic insufficiency    Arthritis    CHF (congestive heart failure) (HCC)    CKD (chronic kidney disease)    Essential hypertension    History of kidney stones    Nerve pain    Non-rheumatic aortic regurgitation    Obstructive sleep apnea    cpap   S/P minimally invasive aortic valve replacement with bioprosthetic valve 12/16/2018   25 mm Edwards Inspiris Resilia stented bovine pericardial tissue valve via right mini thoracotomy approach   SOB (shortness of breath)    with exertion   Syncope 12/2018   Past Surgical History:  Procedure Laterality Date   AORTIC VALVE REPLACEMENT N/A 12/16/2018   Procedure: MINIMALLY INVASIVE AORTIC VALVE REPLACEMENT (AVR) using Inspiris Aortic valve 25mm.;  Surgeon: Dusty Sudie DEL, MD;  Location: [facility] OR;  Service: Open Heart Surgery;  Laterality: N/A;   BACK SURGERY  2023   for stenosis   CYSTOSCOPY W/ RETROGRADES Bilateral 03/09/2024   Procedure:  CYSTOSCOPY, WITH RETROGRADE PYELOGRAM;  Surgeon: Selma Donnice SAUNDERS, MD;  Location: [facility] ORS;  Service: Urology;  Laterality: Bilateral;   CYSTOSCOPY/URETEROSCOPY/HOLMIUM LASER/STENT PLACEMENT Right 11/12/2022   Procedure: CYSTOSCOPY/RIGHT RETROGRADE PYELOGRAM/RIGHT URETEROSCOPY/HOLMIUM LASER/RIGHT STENT PLACEMENT;  Surgeon: Selma Donnice SAUNDERS, MD;  Location: [facility] ORS;  Service: Urology;  Laterality: Right;   CYSTOSCOPY/URETEROSCOPY/HOLMIUM LASER/STENT PLACEMENT Left 03/09/2024   Procedure: CYSTOSCOPY/URETEROSCOPY/HOLMIUM LASER/STENT PLACEMENT;  Surgeon: Selma Donnice SAUNDERS, MD;  Location: [facility] ORS;  Service: Urology;  Laterality: Left;   CYSTOSCOPY/URETEROSCOPY/HOLMIUM LASER/STENT PLACEMENT Right 03/23/2024   Procedure: CYSTOSCOPY/URETEROSCOPY/HOLMIUM LASER/STENT PLACEMENT;  Surgeon: Selma Donnice  R, MD;  Location: [facility] ORS;  Service: Urology;  Laterality: Right;   HERNIA REPAIR     age 83   IR THORACENTESIS ASP PLEURAL SPACE W/IMG GUIDE  12/29/2018   REVERSE SHOULDER ARTHROPLASTY Right 06/01/2021   Procedure: REVERSE SHOULDER ARTHROPLASTY;  Surgeon: Dozier Soulier, MD;  Location: [facility] ORS;  Service: Orthopedics;  Laterality: Right;   RIB PLATING  12/16/2018   Procedure: Rib Plating;  Surgeon: Dusty Sudie DEL, MD;  Location: [facility] OR;  Service: Open Heart Surgery;;   RIGHT/LEFT HEART CATH AND CORONARY ANGIOGRAPHY N/A 10/14/2018   Procedure: RIGHT/LEFT HEART CATH AND CORONARY ANGIOGRAPHY;  Surgeon: Elmira [name], MD;  Location: [facility] INVASIVE CV LAB;  Service: Cardiovascular;  Laterality: N/A;   TEE WITHOUT CARDIOVERSION N/A 11/11/2018   Procedure: TRANSESOPHAGEAL ECHOCARDIOGRAM (TEE);  Surgeon: Elmira [name], MD;  Location: [facility] ENDOSCOPY;  Service: Cardiovascular;  Laterality: N/A;   TEE WITHOUT CARDIOVERSION N/A 12/16/2018   Procedure: TRANSESOPHAGEAL ECHOCARDIOGRAM (TEE);  Surgeon: Dusty Sudie DEL, MD;  Location: [facility] OR;  Service: Open Heart Surgery;  Laterality: N/A;   TOTAL HIP ARTHROPLASTY Left 03/09/2019    Procedure: LEFT TOTAL HIP ARTHROPLASTY ANTERIOR APPROACH;  Surgeon: Jerri Kay HERO, MD;  Location: [facility] OR;  Service: Orthopedics;  Laterality: Left;   Family History  Problem Relation Age of Onset   COPD Father    Cancer Brother    Social History   Socioeconomic History   Marital status: Married    Spouse name: Not on file   Number of children: 4   Years of education: Not on file   Highest education level: Not on file  Occupational History   Not on file  Tobacco Use   Smoking status: Former    Current packs/day: 0.00    Average packs/day: 1 pack/day for 7.0 years (7.0 ttl pk-yrs)    Types: Cigarettes    Start date: 2004    Quit date: 2011    Years since quitting: 14.8   Smokeless tobacco: Never   Tobacco comments:    30 PY  Vaping Use   Vaping status: Never Used  Substance and Sexual Activity   Alcohol use: No   Drug use: No   Sexual activity: Not on file  Other Topics Concern   Not on file  Social History Narrative   Left Handed   Two Story Home   Drinks little caffeine    Social Drivers of Health   Financial Resource Strain: Not on file  Food Insecurity: Not on file  Transportation Needs: Not on file  Physical Activity: Not on file  Stress: Not on file  Social Connections: Unknown (10/09/2021)   [facility]   Social Network    Social Network: Not on file  Intimate Partner Violence: Unknown (08/31/2021)   Received from [facility]   HITS    Physically Hurt: Not on file    Insult or Talk Down To: Not on file    Threaten Physical Harm: Not on file    Scream or Curse: Not on file       Objective:  There were no vitals taken for this visit. {Pulm Vitals (Optional):32837}  Physical Exam  Diagnostic Review:  {Labs (Optional):32838}    Echo 09/2020 Normal LV sytolic fx, EF 55-60%. LVH. Bioprosthetic valve in the aortic. Assessment & Plan:   Assessment & Plan    No follow-ups on file.    Marny Patch, MD Pulmonary and Critical  Care Medicine [facility] Pulmonary  Care

## 2024-04-01 ENCOUNTER — Ambulatory Visit (INDEPENDENT_AMBULATORY_CARE_PROVIDER_SITE_OTHER)

## 2024-04-01 ENCOUNTER — Other Ambulatory Visit: Payer: Self-pay | Admitting: Cardiology

## 2024-04-01 VITALS — BP 114/78 | HR 69 | Temp 98.7°F | Ht 71.0 in | Wt 258.2 lb

## 2024-04-01 DIAGNOSIS — J4540 Moderate persistent asthma, uncomplicated: Secondary | ICD-10-CM

## 2024-04-01 DIAGNOSIS — J45909 Unspecified asthma, uncomplicated: Secondary | ICD-10-CM

## 2024-04-01 LAB — CBC WITH DIFFERENTIAL/PLATELET
Basophils Absolute: 0 K/uL (ref 0.0–0.1)
Basophils Relative: 0.7 % (ref 0.0–3.0)
Eosinophils Absolute: 0.3 K/uL (ref 0.0–0.7)
Eosinophils Relative: 7.9 % — ABNORMAL HIGH (ref 0.0–5.0)
HCT: 41.4 % (ref 39.0–52.0)
Hemoglobin: 13.4 g/dL (ref 13.0–17.0)
Lymphocytes Relative: 39.4 % (ref 12.0–46.0)
Lymphs Abs: 1.5 K/uL (ref 0.7–4.0)
MCHC: 32.3 g/dL (ref 30.0–36.0)
MCV: 84.4 fl (ref 78.0–100.0)
Monocytes Absolute: 0.5 K/uL (ref 0.1–1.0)
Monocytes Relative: 12.1 % — ABNORMAL HIGH (ref 3.0–12.0)
Neutro Abs: 1.5 K/uL (ref 1.4–7.7)
Neutrophils Relative %: 39.9 % — ABNORMAL LOW (ref 43.0–77.0)
Platelets: 200 K/uL (ref 150.0–400.0)
RBC: 4.91 Mil/uL (ref 4.22–5.81)
RDW: 14.8 % (ref 11.5–15.5)
WBC: 3.8 K/uL — ABNORMAL LOW (ref 4.0–10.5)

## 2024-04-01 LAB — POCT EXHALED NITRIC OXIDE: FeNO level (ppb): 16

## 2024-04-01 MED ORDER — SPIRIVA RESPIMAT 2.5 MCG/ACT IN AERS: 2.0000 | INHALATION_SPRAY | Freq: Every day | RESPIRATORY_TRACT | 6 refills | Status: DC

## 2024-04-01 MED ORDER — ALBUTEROL SULFATE (2.5 MG/3ML) 0.083% IN NEBU: 2.5000 mg | INHALATION_SOLUTION | Freq: Four times a day (QID) | RESPIRATORY_TRACT | 6 refills | Status: DC | PRN

## 2024-04-01 MED ORDER — ALBUTEROL SULFATE HFA 108 (90 BASE) MCG/ACT IN AERS: 2.0000 | INHALATION_SPRAY | Freq: Four times a day (QID) | RESPIRATORY_TRACT | 6 refills | Status: DC | PRN

## 2024-04-01 MED ORDER — BUDESONIDE-FORMOTEROL FUMARATE 160-4.5 MCG/ACT IN AERO: 2.0000 | INHALATION_SPRAY | Freq: Two times a day (BID) | RESPIRATORY_TRACT | 6 refills | Status: DC | PRN

## 2024-04-01 NOTE — Patient Instructions (Addendum)
 Dear Mr. Zenith,   Given that your asthma is not well controlled, I will recommend the following:  -Symbicort  2 puff twice a day -Spiriva 2 puffs in the morning -I will do a Chest Xray and blood test to see your asthma. -Pulmonary function test   I will see you in one month  INHALER INSTRUCTIONS: To use the inhaler you follow these steps: Prime the inhaler according to instructions (which means waste between 1-4 doses before next use).  Follow package insert Shake the inhaler before each use Connect spacer Exhale completely by blowing all the air out of your lungs Seal your mouth around the spacer Press down on the canister then inhale SLOW and STEADY until your fill your lungs completely. Hold the breath for 6-10 seconds Gently exhale Wait 60 seconds then repeat steps 2-8. Rinse the mouth after each use to prevent thrush  Your pharmacist can also review proper technique if you have any remaining questions. Let me know if the cost is too high, your insurance may be able to recommend a more affordable option for you.

## 2024-04-02 LAB — RESPIRATORY ALLERGY PANEL REGION II W/ RFLX: ~~LOC~~

## 2024-04-02 LAB — INTERPRETATION:

## 2024-04-03 ENCOUNTER — Ambulatory Visit: Payer: Self-pay

## 2024-04-03 MED ORDER — ALBUTEROL SULFATE (2.5 MG/3ML) 0.083% IN NEBU: 2.5000 mg | INHALATION_SOLUTION | Freq: Four times a day (QID) | RESPIRATORY_TRACT | 6 refills | Status: AC | PRN

## 2024-04-03 MED ORDER — SPIRIVA RESPIMAT 2.5 MCG/ACT IN AERS: 2.0000 | INHALATION_SPRAY | Freq: Every day | RESPIRATORY_TRACT | 6 refills | Status: DC

## 2024-04-03 MED ORDER — BUDESONIDE-FORMOTEROL FUMARATE 160-4.5 MCG/ACT IN AERO: 2.0000 | INHALATION_SPRAY | Freq: Two times a day (BID) | RESPIRATORY_TRACT | 6 refills | Status: AC | PRN

## 2024-04-03 MED ORDER — ALBUTEROL SULFATE HFA 108 (90 BASE) MCG/ACT IN AERS: 2.0000 | INHALATION_SPRAY | Freq: Four times a day (QID) | RESPIRATORY_TRACT | 6 refills | Status: AC | PRN

## 2024-04-03 NOTE — Assessment & Plan Note (Signed)
 Eosinophilia ~300, IgE normal, allergy panel neg.  68 years old who came for evaluation of uncontrolled asthma.  He has been on Symbicort .  Over the last 2 years his symptoms has worsened and he requires to use albuterol  4 times a day over the last month.  His Feno today was 16, expected to be higher if he is having a flare, although he is already on high dose ICS.  Physical examination was normal, no wheezing, I do not think he needs prednisone  taper at this moment.  His last prednisone  course was a year ago.  I will see him in 4 weeks to monitor his symptoms, I will consider to change inhaler to Trelegy 200.  If symptoms continue to progress we will consider biological therapy in the near future.   His last echo was normal, so I dont think his symptoms are associated to his heart condition.  Plan: CXR today Refill inhaler Add spiriva for now  PFTs

## 2024-04-06 ENCOUNTER — Ambulatory Visit: Payer: Self-pay | Admitting: Cardiology

## 2024-04-06 DIAGNOSIS — Z952 Presence of prosthetic heart valve: Secondary | ICD-10-CM

## 2024-04-06 NOTE — Progress Notes (Signed)
 Normal left heart function and valve function.  Mild dilation of right ventricle, may be clinically nonsignificant.  Also mild dilation of aorta, which is likely normal for his height and weight.  Recommend repeating echocardiogram in 6 months to evaluate RV dilatation.  Thanks MJP

## 2024-04-09 ENCOUNTER — Telehealth: Payer: Self-pay

## 2024-04-09 NOTE — Telephone Encounter (Signed)
 Copied from CRM [id number]. Topic: Clinical - Prescription Issue >> Apr 08, 2024  2:33 PM Celestine FALCON wrote: Reason for CRM: Rock with [facility] is calling due to receiving a prescription for budesonide -formoterol  (SYMBICORT ) 160-4.5 MCG/ACT inhaler [[id number]], but she stated the refill history showed the pt receiving wixela inhaler. She wanted to clarify if the pt was taking wixela or not.  Please call to confirm at [phone] ext [phone].    Returned [place] call from the [place] - VM/ LM that MD change the INH  - LOV     - NFN

## 2024-05-27 NOTE — Progress Notes (Incomplete)
 "  New Patient Pulmonology Office Visit   Subjective:  Patient ID: Charles Arrona., male    DOB: 1955/09/19  MRN: [id number]  Referred by: Adrien Guan, [place], MD  CC:  No chief complaint on file.   HPI Charles Longsworth. is a 68 y.o. male with former smoker, OSA, LBBB, CHF s/p bioprosthetic aortic valve replacement 2020, CHF, CKD, stage II, who came for evaluation for asthma.  Patient report that Charles Riggs had asthma for many years and Charles Riggs was using inhaler as needed in the past.  Charles Riggs has started using Symbicort  over the last year.  Charles Riggs noticed that his symptoms has been more pronounced over the last 2 years, and worst over the last month.  Charles Riggs has more shortness of breath with exertion, Charles Riggs has morning wheezing and as well night wheezing that Charles Riggs required to use albuterol . Charles Riggs is using albuterol  inhaler 3-4 times a day.  Charles Riggs is compliant with Symbicort  twice daily.  Charles Riggs has some sputum production.  Charles Riggs denied to have allergies in the past. Las prednisone  course was 1 year ago.  Charles Riggs is a former smoker, 1 pack a day for many years since teenager and Charles Riggs stopped smoking in 2011.  Charles Riggs uses his CPAP machine every night. Denied any current problems with his heart.  ROS as above  Allergies: Patient has no known allergies.  Current Outpatient Medications:    albuterol  (PROVENTIL ) (2.5 MG/3ML) 0.083% nebulizer solution, Take 3 mLs (2.5 mg total) by nebulization every 6 (six) hours as needed for wheezing or shortness of breath., Disp: 75 mL, Rfl: 6   albuterol  (VENTOLIN  HFA) 108 (90 Base) MCG/ACT inhaler, Inhale 2 puffs into the lungs every 6 (six) hours as needed for wheezing or shortness of breath., Disp: 1 g, Rfl: 6   amLODipine  (NORVASC ) 5 MG tablet, Take 1 tablet (5 mg total) by mouth daily. PLEASE MAKE AN APPOINTMENT IN ORDER TO RECEIVE ADDITIONAL REFILLS, FIRST ATTEMPT., Disp: 30 tablet, Rfl: 0   aspirin  EC 81 MG tablet, Take 1 tablet (81 mg total) by mouth daily., Disp: 90 tablet, Rfl: 1   Atogepant   (QULIPTA ) 60 MG TABS, Take 1 tablet (60 mg total) by mouth daily., Disp: 30 tablet, Rfl: 11   baclofen (LIORESAL) 10 MG tablet, Take 10 mg by mouth at bedtime as needed for muscle spasms., Disp: , Rfl:    budesonide -formoterol  (SYMBICORT ) 160-4.5 MCG/ACT inhaler, Inhale 2 puffs into the lungs 2 (two) times daily as needed (shortness of breath)., Disp: 1 each, Rfl: 6   carvedilol  (COREG ) 6.25 MG tablet, Take 6.25 mg by mouth 2 (two) times daily with a meal., Disp: , Rfl:    celecoxib (CELEBREX) 100 MG capsule, Take 100 mg by mouth daily as needed for moderate pain., Disp: , Rfl:    Cholecalciferol (VITAMIN D3) 50 MCG (2000 UT) TABS, Take 2,000 Units by mouth 2 (two) times daily., Disp: , Rfl:    diclofenac Sodium (VOLTAREN) 1 % GEL, Apply 2 g topically daily as needed (pain)., Disp: , Rfl:    empagliflozin (JARDIANCE) 25 MG TABS tablet, Take 12.5 mg by mouth daily., Disp: , Rfl:    ferrous sulfate 325 (65 FE) MG tablet, Take 325 mg by mouth daily as needed., Disp: , Rfl:    fluticasone  (FLONASE ) 50 MCG/ACT nasal spray, Place 2 sprays into both nostrils daily as needed for allergies or rhinitis., Disp: , Rfl:    Fluticasone -Salmeterol (ADVAIR) 250-50 MCG/DOSE AEPB, Inhale 1 puff into the lungs in  the morning and at bedtime., Disp: , Rfl:    gabapentin  (NEURONTIN ) 300 MG capsule, Take 300 mg by mouth 2 (two) times daily as needed (pain)., Disp: , Rfl:    guaiFENesin  (MUCINEX ) 600 MG 12 hr tablet, Take 1,200 mg by mouth 2 (two) times daily., Disp: , Rfl:    lidocaine  (LIDODERM ) 5 %, Place 1 patch onto the skin daily. Remove & Discard patch within 12 hours or as directed by MD (Patient taking differently: Place 1 patch onto the skin daily as needed (pain). Remove & Discard patch within 12 hours or as directed by MD), Disp: 30 patch, Rfl: 0   losartan  (COZAAR ) 50 MG tablet, Take 1 tablet (50 mg total) by mouth daily. PLEASE MAKE AN APPOINTMENT IN ORDER TO RECEIVE ADDITIONAL REFILLS, FIRST ATTEMPT., Disp: 30  tablet, Rfl: 0   omeprazole  (PRILOSEC) 40 MG capsule, Take 40 mg by mouth daily as needed (acid reflux)., Disp: , Rfl:    ondansetron  (ZOFRAN ) 4 MG tablet, Take 1 tablet (4 mg total) by mouth every 6 (six) hours. (Patient taking differently: Take 4 mg by mouth every 8 (eight) hours as needed for nausea or vomiting.), Disp: 15 tablet, Rfl: 0   oxyCODONE -acetaminophen  (PERCOCET) 5-325 MG tablet, Take 1 tablet by mouth every 4 (four) hours as needed for up to 12 doses for severe pain (pain score 7-10)., Disp: 12 tablet, Rfl: 0   polycarbophil (FIBERCON) 625 MG tablet, Take 625 mg by mouth in the morning and at bedtime., Disp: , Rfl:    rosuvastatin  (CRESTOR ) 20 MG tablet, Take 10 mg by mouth at bedtime., Disp: , Rfl:    Semaglutide, 2 MG/DOSE, 8 MG/3ML SOPN, Inject 2 mg into the skin every 7 (seven) days., Disp: , Rfl:    sertraline  (ZOLOFT ) 100 MG tablet, Take 100 mg by mouth daily., Disp: , Rfl:    tamsulosin  (FLOMAX ) 0.4 MG CAPS capsule, Take 1 capsule (0.4 mg total) by mouth daily., Disp: 30 capsule, Rfl: 0   terbinafine (LAMISIL) 1 % cream, Apply 1 Application topically daily as needed (irritation)., Disp: , Rfl:    Tiotropium Bromide (SPIRIVA  RESPIMAT) 2.5 MCG/ACT AERS, Inhale 2 puffs into the lungs daily., Disp: 1 g, Rfl: 6   topiramate  (TOPAMAX ) 50 MG tablet, Take 50 mg by mouth 2 (two) times daily., Disp: , Rfl:    Ubrogepant  (UBRELVY ) 100 MG TABS, Take 1 tablet (100 mg total) by mouth as needed (May repeat in 2 hours if needed.  Maximum 2 tablets in 24 hours.)., Disp: 10 tablet, Rfl: 11 Past Medical History:  Diagnosis Date   Anemia    Aortic insufficiency    Arthritis    CHF (congestive heart failure) (HCC)    CKD (chronic kidney disease)    Essential hypertension    History of kidney stones    Nerve pain    Non-rheumatic aortic regurgitation    Obstructive sleep apnea    cpap   S/P minimally invasive aortic valve replacement with bioprosthetic valve 12/16/2018   25 mm Edwards  Inspiris Resilia stented bovine pericardial tissue valve via right mini thoracotomy approach   SOB (shortness of breath)    with exertion   Syncope 12/2018   Past Surgical History:  Procedure Laterality Date   AORTIC VALVE REPLACEMENT N/A 12/16/2018   Procedure: MINIMALLY INVASIVE AORTIC VALVE REPLACEMENT (AVR) using Inspiris Aortic valve 25mm.;  Surgeon: Dusty Sudie DEL, MD;  Location: [facility] OR;  Service: Open Heart Surgery;  Laterality: N/A;   BACK SURGERY  2023   for stenosis   CYSTOSCOPY W/ RETROGRADES Bilateral 03/09/2024   Procedure: CYSTOSCOPY, WITH RETROGRADE PYELOGRAM;  Surgeon: Selma Donnice SAUNDERS, MD;  Location: [facility] ORS;  Service: Urology;  Laterality: Bilateral;   CYSTOSCOPY/URETEROSCOPY/HOLMIUM LASER/STENT PLACEMENT Right 11/12/2022   Procedure: CYSTOSCOPY/RIGHT RETROGRADE PYELOGRAM/RIGHT URETEROSCOPY/HOLMIUM LASER/RIGHT STENT PLACEMENT;  Surgeon: Selma Donnice SAUNDERS, MD;  Location: [facility] ORS;  Service: Urology;  Laterality: Right;   CYSTOSCOPY/URETEROSCOPY/HOLMIUM LASER/STENT PLACEMENT Left 03/09/2024   Procedure: CYSTOSCOPY/URETEROSCOPY/HOLMIUM LASER/STENT PLACEMENT;  Surgeon: Selma Donnice SAUNDERS, MD;  Location: [facility] ORS;  Service: Urology;  Laterality: Left;   CYSTOSCOPY/URETEROSCOPY/HOLMIUM LASER/STENT PLACEMENT Right 03/23/2024   Procedure: CYSTOSCOPY/URETEROSCOPY/HOLMIUM LASER/STENT PLACEMENT;  Surgeon: Selma Donnice SAUNDERS, MD;  Location: [facility] OR[facility];  Service: Urology;  Laterality: Right;   HERNIA REPAIR     age 27   IR THORACENTESIS ASP PLEURAL SPACE W/IMG GUIDE  12/29/2018   REVERSE SHOULDER ARTHROPLASTY Right 06/01/2021   Procedure: REVERSE SHOULDER ARTHROPLASTY;  Surgeon: Dozier Soulier, MD;  Location: [facility] OR[facility];  Service: Orthopedics;  Laterality: Right;   RIB PLATING  12/16/2018   Procedure: Rib Plating;  Surgeon: Dusty Sudie DEL, MD;  Location: [facility] OR;  Service: Open Heart Surgery;;   RIGHT/LEFT HEART CATH AND CORONARY ANGIOGRAPHY N/A 10/14/2018   Procedure: RIGHT/LEFT HEART CATH AND CORONARY  ANGIOGRAPHY;  Surgeon: Elmira [name], MD;  Location: [facility] INVASIVE CV LAB;  Service: Cardiovascular;  Laterality: N/A;   TEE WITHOUT CARDIOVERSION N/A 11/11/2018   Procedure: TRANSESOPHAGEAL ECHOCARDIOGRAM (TEE);  Surgeon: Elmira [name], MD;  Location: [facility] ENDOSCOPY;  Service: Cardiovascular;  Laterality: N/A;   TEE WITHOUT CARDIOVERSION N/A 12/16/2018   Procedure: TRANSESOPHAGEAL ECHOCARDIOGRAM (TEE);  Surgeon: Dusty Sudie DEL, MD;  Location: [facility] OR;  Service: Open Heart Surgery;  Laterality: N/A;   TOTAL HIP ARTHROPLASTY Left 03/09/2019   Procedure: LEFT TOTAL HIP ARTHROPLASTY ANTERIOR APPROACH;  Surgeon: Jerri Kay HERO, MD;  Location: [facility] OR;  Service: Orthopedics;  Laterality: Left;   Family History  Problem Relation Age of Onset   COPD Father    Cancer Brother    Social History   Socioeconomic History   Marital status: Married    Spouse name: Not on file   Number of children: 4   Years of education: Not on file   Highest education level: Not on file  Occupational History   Not on file  Tobacco Use   Smoking status: Former    Current packs/day: 0.00    Average packs/day: 1 pack/day for 7.0 years (7.0 ttl pk-yrs)    Types: Cigarettes    Start date: 2004    Quit date: 2011    Years since quitting: 15.0   Smokeless tobacco: Never   Tobacco comments:    30 PY  Vaping Use   Vaping status: Never Used  Substance and Sexual Activity   Alcohol use: No   Drug use: No   Sexual activity: Not on file  Other Topics Concern   Not on file  Social History Narrative   Left Handed   Two Story Home   Drinks little caffeine    Social Drivers of Health   Tobacco Use: Medium Risk (04/01/2024)   Patient History    Smoking Tobacco Use: Former    Smokeless Tobacco Use: Never    Passive Exposure: Not on Actuary Strain: Not on file  Food Insecurity: Not on file  Transportation Needs: Not on file  Physical Activity: Not on file  Stress: Not on file  Social  Connections: Unknown (  10/09/2021)   Received from [facility]   Social Network    Social Network: Not on file  Intimate Partner Violence: Unknown (08/31/2021)   Received from [facility]   HITS    Physically Hurt: Not on file    Insult or Talk Down To: Not on file    Threaten Physical Harm: Not on file    Scream or Curse: Not on file  Depression (EYV7-0): Not on file  Alcohol Screen: Not on file  Housing: Not on file  Utilities: Not on file  Health Literacy: Not on file       Objective:  There were no vitals taken for this visit. Wt Readings from Last 3 Encounters:  04/01/24 258 lb 3.2 oz (117.1 kg)  03/23/24 250 lb (113.4 kg)  03/09/24 250 lb (113.4 kg)   SpO2 Readings from Last 3 Encounters:  04/01/24 100%  03/23/24 93%  03/09/24 99%    Physical Exam Constitutional:      Appearance: Normal appearance.  HENT:     Head: Normocephalic.     Mouth/Throat:     Mouth: Mucous membranes are moist.  Eyes:     Extraocular Movements: Extraocular movements intact.     Pupils: Pupils are equal, round, and reactive to light.  Cardiovascular:     Rate and Rhythm: Normal rate and regular rhythm.     Comments: Systolic murmur present Pulmonary:     Effort: Pulmonary effort is normal.     Breath sounds: Normal breath sounds.     Comments: No wheezing Musculoskeletal:        General: Normal range of motion.  Neurological:     General: No focal deficit present.     Mental Status: Charles Riggs is alert and oriented to person, place, and time.      Echo 09/2020 Normal LV sytolic fx, EF 55-60%. LVH. Bioprosthetic valve in the aortic.  CT chest 09/2022 ungs/Pleura: Major airways are patent. Lung volumes and ventilation are improved from the 2020 CTA. Right pleural effusion at that time has resolved. Chronic postoperative changes to the anterior right upper ribs, right upper lobe, and small chronic postoperative pulmonary herniation into the anterior chest wall (series 5, image 35).  Regional lung architectural distortion there has regressed but not resolved since 2020.  CXR 04/01/2024 No active cardiopulmonary disease. No significant interval change.   04/01/24 FENO 16  Eos 300 Allergy  panel: IgE 32, negative panel   Assessment & Plan:   Assessment & Plan  Moderate persistent asthma, unspecified whether complicated Eosinophilia ~300, IgE normal, allergy  panel neg.   68 years old who came for evaluation of uncontrolled asthma.  Charles Riggs has been on Symbicort .  Over the last 2 years his symptoms has worsened and Charles Riggs requires to use albuterol  4 times a day over the last month.  His Feno today was 16, expected to be higher if Charles Riggs is having a flare, although Charles Riggs is already on high dose ICS.  Physical examination was normal, no wheezing, I do not think Charles Riggs needs prednisone  taper at this moment.  His last prednisone  course was a year ago.   I will see him in 4 weeks to monitor his symptoms, I will consider to change inhaler to Trelegy 200.  If symptoms continue to progress we will consider biological therapy in the near future.    His last echo was normal, so I dont think his symptoms are associated to his heart condition.   CXR today Refill inhaler Add spiriva  for  now  PFTs No follow-ups on file.    Marny Patch, MD Pulmonary and Critical Care Medicine [facility] "

## 2024-05-29 ENCOUNTER — Ambulatory Visit: Admitting: *Deleted

## 2024-05-29 ENCOUNTER — Ambulatory Visit

## 2024-05-29 DIAGNOSIS — J4540 Moderate persistent asthma, uncomplicated: Secondary | ICD-10-CM

## 2024-05-29 LAB — PULMONARY FUNCTION TEST
DL/VA % pred: 85 %
DL/VA: 3.5 ml/min/mmHg/L
DLCO cor % pred: 63 %
DLCO cor: 16.73 ml/min/mmHg
DLCO unc % pred: 61 %
DLCO unc: 16.13 ml/min/mmHg
FEF 25-75 Post: 0.75 L/s
FEF 25-75 Pre: 1.01 L/s
FEF2575-%Change-Post: -26 %
FEF2575-%Pred-Post: 28 %
FEF2575-%Pred-Pre: 38 %
FEV1-%Change-Post: -3 %
FEV1-%Pred-Post: 49 %
FEV1-%Pred-Pre: 51 %
FEV1-Post: 1.68 L
FEV1-Pre: 1.73 L
FEV1FVC-%Change-Post: 4 %
FEV1FVC-%Pred-Pre: 88 %
FEV6-%Change-Post: -6 %
FEV6-%Pred-Post: 57 %
FEV6-%Pred-Pre: 61 %
FEV6-Post: 2.45 L
FEV6-Pre: 2.62 L
FEV6FVC-%Change-Post: 0 %
FEV6FVC-%Pred-Post: 105 %
FEV6FVC-%Pred-Pre: 105 %
FVC-%Change-Post: -6 %
FVC-%Pred-Post: 54 %
FVC-%Pred-Pre: 58 %
FVC-Post: 2.45 L
FVC-Pre: 2.64 L
Post FEV1/FVC ratio: 69 %
Post FEV6/FVC ratio: 100 %
Pre FEV1/FVC ratio: 66 %
Pre FEV6/FVC Ratio: 99 %
RV % pred: 113 %
RV: 2.71 L
TLC % pred: 85 %
TLC: 6 L

## 2024-05-29 NOTE — Progress Notes (Signed)
 Full PFT performed today.

## 2024-05-29 NOTE — Patient Instructions (Signed)
 Full PFT performed today.

## 2024-05-31 NOTE — Progress Notes (Incomplete)
 "  New Patient Pulmonology Office Visit   Subjective:  Patient ID: Charles Warne., male    DOB: Jul 04, 1955  MRN: [id number]  Referred by: Adrien Guan, [place], MD  CC:  No chief complaint on file.   HPI Charles Weaver. is a 69 y.o. male with former smoker, OSA, LBBB, CHF s/p bioprosthetic aortic valve replacement 2020, CHF, CKD, stage II, who came for evaluation for asthma.  Patient report that he had asthma for many years and he was using inhaler as needed in the past.  He has started using Symbicort  over the last year.  He noticed that his symptoms has been more pronounced over the last 2 years, and worst over the last month.  He has more shortness of breath with exertion, he has morning wheezing and as well night wheezing that he required to use albuterol . He is using albuterol  inhaler 3-4 times a day.  He is compliant with Symbicort  twice daily.  He has some sputum production.  He denied to have allergies in the past. Las prednisone  course was 1 year ago.  He is a former smoker, 1 pack a day for many years since teenager and he stopped smoking in 2011.  He uses his CPAP machine every night. Denied any current problems with his heart.  ROS as above  Allergies: Patient has no known allergies.  Current Outpatient Medications:    albuterol  (PROVENTIL ) (2.5 MG/3ML) 0.083% nebulizer solution, Take 3 mLs (2.5 mg total) by nebulization every 6 (six) hours as needed for wheezing or shortness of breath., Disp: 75 mL, Rfl: 6   albuterol  (VENTOLIN  HFA) 108 (90 Base) MCG/ACT inhaler, Inhale 2 puffs into the lungs every 6 (six) hours as needed for wheezing or shortness of breath., Disp: 1 g, Rfl: 6   amLODipine  (NORVASC ) 5 MG tablet, Take 1 tablet (5 mg total) by mouth daily. PLEASE MAKE AN APPOINTMENT IN ORDER TO RECEIVE ADDITIONAL REFILLS, FIRST ATTEMPT., Disp: 30 tablet, Rfl: 0   aspirin  EC 81 MG tablet, Take 1 tablet (81 mg total) by mouth daily., Disp: 90 tablet, Rfl: 1   Atogepant   (QULIPTA ) 60 MG TABS, Take 1 tablet (60 mg total) by mouth daily., Disp: 30 tablet, Rfl: 11   baclofen (LIORESAL) 10 MG tablet, Take 10 mg by mouth at bedtime as needed for muscle spasms., Disp: , Rfl:    budesonide -formoterol  (SYMBICORT ) 160-4.5 MCG/ACT inhaler, Inhale 2 puffs into the lungs 2 (two) times daily as needed (shortness of breath)., Disp: 1 each, Rfl: 6   carvedilol  (COREG ) 6.25 MG tablet, Take 6.25 mg by mouth 2 (two) times daily with a meal., Disp: , Rfl:    celecoxib (CELEBREX) 100 MG capsule, Take 100 mg by mouth daily as needed for moderate pain., Disp: , Rfl:    Cholecalciferol (VITAMIN D3) 50 MCG (2000 UT) TABS, Take 2,000 Units by mouth 2 (two) times daily., Disp: , Rfl:    diclofenac Sodium (VOLTAREN) 1 % GEL, Apply 2 g topically daily as needed (pain)., Disp: , Rfl:    empagliflozin (JARDIANCE) 25 MG TABS tablet, Take 12.5 mg by mouth daily., Disp: , Rfl:    ferrous sulfate 325 (65 FE) MG tablet, Take 325 mg by mouth daily as needed., Disp: , Rfl:    fluticasone  (FLONASE ) 50 MCG/ACT nasal spray, Place 2 sprays into both nostrils daily as needed for allergies or rhinitis., Disp: , Rfl:    Fluticasone -Salmeterol (ADVAIR) 250-50 MCG/DOSE AEPB, Inhale 1 puff into the lungs in  the morning and at bedtime., Disp: , Rfl:    gabapentin  (NEURONTIN ) 300 MG capsule, Take 300 mg by mouth 2 (two) times daily as needed (pain)., Disp: , Rfl:    guaiFENesin  (MUCINEX ) 600 MG 12 hr tablet, Take 1,200 mg by mouth 2 (two) times daily., Disp: , Rfl:    lidocaine  (LIDODERM ) 5 %, Place 1 patch onto the skin daily. Remove & Discard patch within 12 hours or as directed by MD (Patient taking differently: Place 1 patch onto the skin daily as needed (pain). Remove & Discard patch within 12 hours or as directed by MD), Disp: 30 patch, Rfl: 0   losartan  (COZAAR ) 50 MG tablet, Take 1 tablet (50 mg total) by mouth daily. PLEASE MAKE AN APPOINTMENT IN ORDER TO RECEIVE ADDITIONAL REFILLS, FIRST ATTEMPT., Disp: 30  tablet, Rfl: 0   omeprazole  (PRILOSEC) 40 MG capsule, Take 40 mg by mouth daily as needed (acid reflux)., Disp: , Rfl:    ondansetron  (ZOFRAN ) 4 MG tablet, Take 1 tablet (4 mg total) by mouth every 6 (six) hours. (Patient taking differently: Take 4 mg by mouth every 8 (eight) hours as needed for nausea or vomiting.), Disp: 15 tablet, Rfl: 0   oxyCODONE -acetaminophen  (PERCOCET) 5-325 MG tablet, Take 1 tablet by mouth every 4 (four) hours as needed for up to 12 doses for severe pain (pain score 7-10)., Disp: 12 tablet, Rfl: 0   polycarbophil (FIBERCON) 625 MG tablet, Take 625 mg by mouth in the morning and at bedtime., Disp: , Rfl:    rosuvastatin  (CRESTOR ) 20 MG tablet, Take 10 mg by mouth at bedtime., Disp: , Rfl:    Semaglutide, 2 MG/DOSE, 8 MG/3ML SOPN, Inject 2 mg into the skin every 7 (seven) days., Disp: , Rfl:    sertraline  (ZOLOFT ) 100 MG tablet, Take 100 mg by mouth daily., Disp: , Rfl:    tamsulosin  (FLOMAX ) 0.4 MG CAPS capsule, Take 1 capsule (0.4 mg total) by mouth daily., Disp: 30 capsule, Rfl: 0   terbinafine (LAMISIL) 1 % cream, Apply 1 Application topically daily as needed (irritation)., Disp: , Rfl:    Tiotropium Bromide (SPIRIVA  RESPIMAT) 2.5 MCG/ACT AERS, Inhale 2 puffs into the lungs daily., Disp: 1 g, Rfl: 6   topiramate  (TOPAMAX ) 50 MG tablet, Take 50 mg by mouth 2 (two) times daily., Disp: , Rfl:    Ubrogepant  (UBRELVY ) 100 MG TABS, Take 1 tablet (100 mg total) by mouth as needed (May repeat in 2 hours if needed.  Maximum 2 tablets in 24 hours.)., Disp: 10 tablet, Rfl: 11 Past Medical History:  Diagnosis Date   Anemia    Aortic insufficiency    Arthritis    CHF (congestive heart failure) (HCC)    CKD (chronic kidney disease)    Essential hypertension    History of kidney stones    Nerve pain    Non-rheumatic aortic regurgitation    Obstructive sleep apnea    cpap   S/P minimally invasive aortic valve replacement with bioprosthetic valve 12/16/2018   25 mm Edwards  Inspiris Resilia stented bovine pericardial tissue valve via right mini thoracotomy approach   SOB (shortness of breath)    with exertion   Syncope 12/2018   Past Surgical History:  Procedure Laterality Date   AORTIC VALVE REPLACEMENT N/A 12/16/2018   Procedure: MINIMALLY INVASIVE AORTIC VALVE REPLACEMENT (AVR) using Inspiris Aortic valve 25mm.;  Surgeon: Dusty Sudie DEL, MD;  Location: [facility] OR;  Service: Open Heart Surgery;  Laterality: N/A;   BACK SURGERY  2023   for stenosis   CYSTOSCOPY W/ RETROGRADES Bilateral 03/09/2024   Procedure: CYSTOSCOPY, WITH RETROGRADE PYELOGRAM;  Surgeon: Selma Donnice SAUNDERS, MD;  Location: [facility] ORS;  Service: Urology;  Laterality: Bilateral;   CYSTOSCOPY/URETEROSCOPY/HOLMIUM LASER/STENT PLACEMENT Right 11/12/2022   Procedure: CYSTOSCOPY/RIGHT RETROGRADE PYELOGRAM/RIGHT URETEROSCOPY/HOLMIUM LASER/RIGHT STENT PLACEMENT;  Surgeon: Selma Donnice SAUNDERS, MD;  Location: [facility];  Service: Urology;  Laterality: Right;   CYSTOSCOPY/URETEROSCOPY/HOLMIUM LASER/STENT PLACEMENT Left 03/09/2024   Procedure: CYSTOSCOPY/URETEROSCOPY/HOLMIUM LASER/STENT PLACEMENT;  Surgeon: Selma Donnice SAUNDERS, MD;  Location: [facility];  Service: Urology;  Laterality: Left;   CYSTOSCOPY/URETEROSCOPY/HOLMIUM LASER/STENT PLACEMENT Right 03/23/2024   Procedure: CYSTOSCOPY/URETEROSCOPY/HOLMIUM LASER/STENT PLACEMENT;  Surgeon: Selma Donnice SAUNDERS, MD;  Location: [facility];  Service: Urology;  Laterality: Right;   HERNIA REPAIR     age 2   IR THORACENTESIS ASP PLEURAL SPACE W/IMG GUIDE  12/29/2018   REVERSE SHOULDER ARTHROPLASTY Right 06/01/2021   Procedure: REVERSE SHOULDER ARTHROPLASTY;  Surgeon: Dozier Soulier, MD;  Location: [facility];  Service: Orthopedics;  Laterality: Right;   RIB PLATING  12/16/2018   Procedure: Rib Plating;  Surgeon: Dusty Sudie DEL, MD;  Location: [facility] OR;  Service: Open Heart Surgery;;   RIGHT/LEFT HEART CATH AND CORONARY ANGIOGRAPHY N/A 10/14/2018   Procedure: RIGHT/LEFT HEART CATH AND CORONARY  ANGIOGRAPHY;  Surgeon: Elmira [name], MD;  Location: [facility];  Service: Cardiovascular;  Laterality: N/A;   TEE WITHOUT CARDIOVERSION N/A 11/11/2018   Procedure: TRANSESOPHAGEAL ECHOCARDIOGRAM (TEE);  Surgeon: Elmira [name], MD;  Location: [facility];  Service: Cardiovascular;  Laterality: N/A;   TEE WITHOUT CARDIOVERSION N/A 12/16/2018   Procedure: TRANSESOPHAGEAL ECHOCARDIOGRAM (TEE);  Surgeon: Dusty Sudie DEL, MD;  Location: [facility] OR;  Service: Open Heart Surgery;  Laterality: N/A;   TOTAL HIP ARTHROPLASTY Left 03/09/2019   Procedure: LEFT TOTAL HIP ARTHROPLASTY ANTERIOR APPROACH;  Surgeon: Jerri Kay HERO, MD;  Location: [facility] OR;  Service: Orthopedics;  Laterality: Left;   Family History  Problem Relation Age of Onset   COPD Father    Cancer Brother    Social History   Socioeconomic History   Marital status: Married    Spouse name: Not on file   Number of children: 4   Years of education: Not on file   Highest education level: Not on file  Occupational History   Not on file  Tobacco Use   Smoking status: Former    Current packs/day: 0.00    Average packs/day: 1 pack/day for 7.0 years (7.0 ttl pk-yrs)    Types: Cigarettes    Start date: 2004    Quit date: 2011    Years since quitting: 15.0   Smokeless tobacco: Never   Tobacco comments:    30 PY  Vaping Use   Vaping status: Never Used  Substance and Sexual Activity   Alcohol use: No   Drug use: No   Sexual activity: Not on file  Other Topics Concern   Not on file  Social History Narrative   Left Handed   Two Story Home   Drinks little caffeine    Social Drivers of Health   Tobacco Use: Medium Risk (04/01/2024)   Patient History    Smoking Tobacco Use: Former    Smokeless Tobacco Use: Never    Passive Exposure: Not on Actuary Strain: Not on file  Food Insecurity: Not on file  Transportation Needs: Not on file  Physical Activity: Not on file  Stress: Not on file  Social  Connections: Unknown (  10/09/2021)   [facility]   Social Network    Social Network: Not on file  Intimate Partner Violence: Unknown (08/31/2021)   Received from [facility]   HITS    Physically Hurt: Not on file    Insult or Talk Down To: Not on file    Threaten Physical Harm: Not on file    Scream or Curse: Not on file  Depression (EYV7-0): Not on file  Alcohol Screen: Not on file  Housing: Not on file  Utilities: Not on file  Health Literacy: Not on file       Objective:  There were no vitals taken for this visit. Wt Readings from Last 3 Encounters:  04/01/24 258 lb 3.2 oz (117.1 kg)  03/23/24 250 lb (113.4 kg)  03/09/24 250 lb (113.4 kg)   SpO2 Readings from Last 3 Encounters:  04/01/24 100%  03/23/24 93%  03/09/24 99%    Physical Exam Constitutional:      Appearance: Normal appearance.  HENT:     Head: Normocephalic.     Mouth/Throat:     Mouth: Mucous membranes are moist.  Eyes:     Extraocular Movements: Extraocular movements intact.     Pupils: Pupils are equal, round, and reactive to light.  Cardiovascular:     Rate and Rhythm: Normal rate and regular rhythm.     Comments: Systolic murmur present Pulmonary:     Effort: Pulmonary effort is normal.     Breath sounds: Normal breath sounds.     Comments: No wheezing Musculoskeletal:        General: Normal range of motion.  Neurological:     General: No focal deficit present.     Mental Status: He is alert and oriented to person, place, and time.      Echo 09/2020 Normal LV sytolic fx, EF 55-60%. LVH. Bioprosthetic valve in the aortic.  CT chest 09/2022 ungs/Pleura: Major airways are patent. Lung volumes and ventilation are improved from the 2020 CTA. Right pleural effusion at that time has resolved. Chronic postoperative changes to the anterior right upper ribs, right upper lobe, and small chronic postoperative pulmonary herniation into the anterior chest wall (series 5, image 35).  Regional lung architectural distortion there has regressed but not resolved since 2020.  CXR 04/01/2024 No active cardiopulmonary disease. No significant interval change.   04/01/24 FENO 16  Eos 300 Allergy  panel: IgE 32, negative panel   PFTs  05/29/2024 FVC   2.6/-3.3/58% FEV1  1.7/-3.4/51% F/F       66% TLC     6/-1.2/85% RV       2.7/+0.8/113% DLCO  16/-2.6/61%  05/29/2024 - fixed moderate obstruction, no improve with BD, midl reduction of DLCO  Assessment & Plan:   Assessment & Plan  Moderate persistent asthma, unspecified whether complicated Eosinophilia ~300, IgE normal, allergy  panel neg.   69 years old who came for evaluation of uncontrolled asthma.  He has been on Symbicort .  Over the last 2 years his symptoms has worsened and he requires to use albuterol  4 times a day over the last month.  His Feno today was 16, expected to be higher if he is having a flare, although he is already on high dose ICS.  Physical examination was normal, no wheezing, I do not think he needs prednisone  taper at this moment.  His last prednisone  course was a year ago.   I will see him in 4 weeks to monitor his symptoms, I will consider  to change inhaler to Trelegy 200.  If symptoms continue to progress we will consider biological therapy in the near future.    His last echo was normal, so I dont think his symptoms are associated to his heart condition.   CXR today Refill inhaler Add spiriva  for now  PFTs No follow-ups on file.    Marny Patch, MD Pulmonary and Critical Care Medicine [facility] "

## 2024-06-01 ENCOUNTER — Ambulatory Visit

## 2024-06-01 ENCOUNTER — Ambulatory Visit (INDEPENDENT_AMBULATORY_CARE_PROVIDER_SITE_OTHER)

## 2024-06-01 VITALS — BP 101/71 | HR 73 | Temp 98.2°F | Ht 71.0 in | Wt 247.4 lb

## 2024-06-01 DIAGNOSIS — Z87891 Personal history of nicotine dependence: Secondary | ICD-10-CM | POA: Diagnosis not present

## 2024-06-01 DIAGNOSIS — J4540 Moderate persistent asthma, uncomplicated: Secondary | ICD-10-CM | POA: Diagnosis not present

## 2024-06-01 MED ORDER — ALBUTEROL SULFATE (2.5 MG/3ML) 0.083% IN NEBU: 2.5000 mg | INHALATION_SOLUTION | Freq: Four times a day (QID) | RESPIRATORY_TRACT | Status: AC | PRN

## 2024-06-01 MED ORDER — TRELEGY ELLIPTA 200-62.5-25 MCG/ACT IN AEPB: 1.0000 | INHALATION_SPRAY | Freq: Every day | RESPIRATORY_TRACT | 6 refills | Status: DC

## 2024-06-01 NOTE — Assessment & Plan Note (Deleted)
 Orders:    albuterol  (PROVENTIL ) (2.5 MG/3ML) 0.083% nebulizer solution 2.5 mg

## 2024-06-01 NOTE — Progress Notes (Signed)
 "  New Patient Pulmonology Office Visit   Subjective:  Patient ID: Charles Domine., male    DOB: 06-02-1955  MRN: [id number]  Referred by: Charles Guan, Tamala, MD  CC:  Chief Complaint  Patient presents with   Asthma    Pt states since LOV breathing has been on and off SOB occurs sometimes Prod  cough ( phlegm clear)    Asthma His past medical history is significant for asthma.   Charles Harmes. is a 69 y.o. male with former smoker, OSA, LBBB, CHF s/p bioprosthetic aortic valve replacement 2020, CHF, CKD, stage II, who came for evaluation for asthma.  Patient report that he had asthma for many years and he was using inhaler as needed in the past.  He has started using Symbicort  over the last year.  He noticed that his symptoms has been more pronounced over the last 2 years, and worst over the last month.  He has more shortness of breath with exertion, he has morning wheezing and as well night wheezing that he required to use albuterol . He is using albuterol  inhaler 3-4 times a day.  He is compliant with Symbicort  twice daily.  He has some sputum production.  He denied to have allergies in the past. Las prednisone  course was 1 year ago~ 2024.  He is a former smoker, 1 pack a day for many years since teenager and he stopped smoking in 2011.  He uses his CPAP machine every night. Denied any current problems with his heart.  Discussed the use of AI scribe software for clinical note transcription with the patient, who gave verbal consent to proceed.  History of Present Illness He reports labored breathing and shortness of breath with stressful or strenuous activity and fast walking, with constant wheezing and congestion. Symptoms have progressively worsened over the years, bu it has been similar this year.  He smoked a pack per day for over ten years and quit in 2011. He uses Symbicort  twice daily and albuterol  as needed two to three times per week. He uses a nebulizer one to two  times per month.   Last week he had a severe episode of shortness of breath and wheezing, which he describes as the worst since his aortic valve replacement in 2021, and it was associated with heart palpitations.  His breathing issues began during [employer] service after gas exposure in [employer] training that caused throat burning and persistent cough.  Pulmonary function tests confirm COPD, and he has been evaluated for asthma through the [facility]. He manages symptoms with his current inhaler regimen despite gradual worsening.   ROS as above  Allergies: Patient has no known allergies.  Current Outpatient Medications:    albuterol  (PROVENTIL ) (2.5 MG/3ML) 0.083% nebulizer solution, Take 3 mLs (2.5 mg total) by nebulization every 6 (six) hours as needed for wheezing or shortness of breath., Disp: 75 mL, Rfl: 6   albuterol  (VENTOLIN  HFA) 108 (90 Base) MCG/ACT inhaler, Inhale 2 puffs into the lungs every 6 (six) hours as needed for wheezing or shortness of breath., Disp: 1 g, Rfl: 6   amLODipine  (NORVASC ) 5 MG tablet, Take 1 tablet (5 mg total) by mouth daily. PLEASE MAKE AN APPOINTMENT IN ORDER TO RECEIVE ADDITIONAL REFILLS, FIRST ATTEMPT., Disp: 30 tablet, Rfl: 0   aspirin  EC 81 MG tablet, Take 1 tablet (81 mg total) by mouth daily., Disp: 90 tablet, Rfl: 1   Atogepant  (QULIPTA ) 60 MG TABS, Take 1 tablet (60 mg total) by  mouth daily., Disp: 30 tablet, Rfl: 11   baclofen (LIORESAL) 10 MG tablet, Take 10 mg by mouth at bedtime as needed for muscle spasms., Disp: , Rfl:    budesonide -formoterol  (SYMBICORT ) 160-4.5 MCG/ACT inhaler, Inhale 2 puffs into the lungs 2 (two) times daily as needed (shortness of breath)., Disp: 1 each, Rfl: 6   carvedilol  (COREG ) 6.25 MG tablet, Take 6.25 mg by mouth 2 (two) times daily with a meal., Disp: , Rfl:    celecoxib (CELEBREX) 100 MG capsule, Take 100 mg by mouth daily as needed for moderate pain., Disp: , Rfl:    Cholecalciferol (VITAMIN D3) 50 MCG (2000 UT) TABS, Take  2,000 Units by mouth 2 (two) times daily., Disp: , Rfl:    diclofenac Sodium (VOLTAREN) 1 % GEL, Apply 2 g topically daily as needed (pain)., Disp: , Rfl:    empagliflozin (JARDIANCE) 25 MG TABS tablet, Take 12.5 mg by mouth daily., Disp: , Rfl:    ferrous sulfate 325 (65 FE) MG tablet, Take 325 mg by mouth daily as needed., Disp: , Rfl:    fluticasone  (FLONASE ) 50 MCG/ACT nasal spray, Place 2 sprays into both nostrils daily as needed for allergies or rhinitis., Disp: , Rfl:    Fluticasone -Salmeterol (ADVAIR) 250-50 MCG/DOSE AEPB, Inhale 1 puff into the lungs in the morning and at bedtime., Disp: , Rfl:    gabapentin  (NEURONTIN ) 300 MG capsule, Take 300 mg by mouth 2 (two) times daily as needed (pain)., Disp: , Rfl:    guaiFENesin  (MUCINEX ) 600 MG 12 hr tablet, Take 1,200 mg by mouth 2 (two) times daily., Disp: , Rfl:    lidocaine  (LIDODERM ) 5 %, Place 1 patch onto the skin daily. Remove & Discard patch within 12 hours or as directed by MD (Patient taking differently: Place 1 patch onto the skin daily as needed (pain). Remove & Discard patch within 12 hours or as directed by MD), Disp: 30 patch, Rfl: 0   losartan  (COZAAR ) 50 MG tablet, Take 1 tablet (50 mg total) by mouth daily. PLEASE MAKE AN APPOINTMENT IN ORDER TO RECEIVE ADDITIONAL REFILLS, FIRST ATTEMPT., Disp: 30 tablet, Rfl: 0   omeprazole  (PRILOSEC) 40 MG capsule, Take 40 mg by mouth daily as needed (acid reflux)., Disp: , Rfl:    ondansetron  (ZOFRAN ) 4 MG tablet, Take 1 tablet (4 mg total) by mouth every 6 (six) hours. (Patient taking differently: Take 4 mg by mouth every 8 (eight) hours as needed for nausea or vomiting.), Disp: 15 tablet, Rfl: 0   oxyCODONE -acetaminophen  (PERCOCET) 5-325 MG tablet, Take 1 tablet by mouth every 4 (four) hours as needed for up to 12 doses for severe pain (pain score 7-10)., Disp: 12 tablet, Rfl: 0   polycarbophil (FIBERCON) 625 MG tablet, Take 625 mg by mouth in the morning and at bedtime., Disp: , Rfl:     rosuvastatin  (CRESTOR ) 20 MG tablet, Take 10 mg by mouth at bedtime., Disp: , Rfl:    Semaglutide, 2 MG/DOSE, 8 MG/3ML SOPN, Inject 2 mg into the skin every 7 (seven) days., Disp: , Rfl:    sertraline  (ZOLOFT ) 100 MG tablet, Take 100 mg by mouth daily., Disp: , Rfl:    tamsulosin  (FLOMAX ) 0.4 MG CAPS capsule, Take 1 capsule (0.4 mg total) by mouth daily., Disp: 30 capsule, Rfl: 0   terbinafine (LAMISIL) 1 % cream, Apply 1 Application topically daily as needed (irritation)., Disp: , Rfl:    Tiotropium Bromide (SPIRIVA  RESPIMAT) 2.5 MCG/ACT AERS, Inhale 2 puffs into the lungs daily.,  Disp: 1 g, Rfl: 6   topiramate  (TOPAMAX ) 50 MG tablet, Take 50 mg by mouth 2 (two) times daily., Disp: , Rfl:    Ubrogepant  (UBRELVY ) 100 MG TABS, Take 1 tablet (100 mg total) by mouth as needed (May repeat in 2 hours if needed.  Maximum 2 tablets in 24 hours.)., Disp: 10 tablet, Rfl: 11 Past Medical History:  Diagnosis Date   Anemia    Aortic insufficiency    Arthritis    CHF (congestive heart failure) (HCC)    CKD (chronic kidney disease)    Essential hypertension    History of kidney stones    Nerve pain    Non-rheumatic aortic regurgitation    Obstructive sleep apnea    cpap   S/P minimally invasive aortic valve replacement with bioprosthetic valve 12/16/2018   25 mm Edwards Inspiris Resilia stented bovine pericardial tissue valve via right mini thoracotomy approach   SOB (shortness of breath)    with exertion   Syncope 12/2018   Past Surgical History:  Procedure Laterality Date   AORTIC VALVE REPLACEMENT N/A 12/16/2018   Procedure: MINIMALLY INVASIVE AORTIC VALVE REPLACEMENT (AVR) using Inspiris Aortic valve 25mm.;  Surgeon: Dusty Sudie DEL, MD;  Location: [facility] OR;  Service: Open Heart Surgery;  Laterality: N/A;   BACK SURGERY  2023   for stenosis   CYSTOSCOPY W/ RETROGRADES Bilateral 03/09/2024   Procedure: CYSTOSCOPY, WITH RETROGRADE PYELOGRAM;  Surgeon: Selma Donnice SAUNDERS, MD;  Location: [facility] ORS;   Service: Urology;  Laterality: Bilateral;   CYSTOSCOPY/URETEROSCOPY/HOLMIUM LASER/STENT PLACEMENT Right 11/12/2022   Procedure: CYSTOSCOPY/RIGHT RETROGRADE PYELOGRAM/RIGHT URETEROSCOPY/HOLMIUM LASER/RIGHT STENT PLACEMENT;  Surgeon: Selma Donnice SAUNDERS, MD;  Location: [facility];  Service: Urology;  Laterality: Right;   CYSTOSCOPY/URETEROSCOPY/HOLMIUM LASER/STENT PLACEMENT Left 03/09/2024   Procedure: CYSTOSCOPY/URETEROSCOPY/HOLMIUM LASER/STENT PLACEMENT;  Surgeon: Selma Donnice SAUNDERS, MD;  Location: [facility];  Service: Urology;  Laterality: Left;   CYSTOSCOPY/URETEROSCOPY/HOLMIUM LASER/STENT PLACEMENT Right 03/23/2024   Procedure: CYSTOSCOPY/URETEROSCOPY/HOLMIUM LASER/STENT PLACEMENT;  Surgeon: Selma Donnice SAUNDERS, MD;  Location: [facility];  Service: Urology;  Laterality: Right;   HERNIA REPAIR     age 41   IR THORACENTESIS ASP PLEURAL SPACE W/IMG GUIDE  12/29/2018   REVERSE SHOULDER ARTHROPLASTY Right 06/01/2021   Procedure: REVERSE SHOULDER ARTHROPLASTY;  Surgeon: Dozier Soulier, MD;  Location: [facility];  Service: Orthopedics;  Laterality: Right;   RIB PLATING  12/16/2018   Procedure: Rib Plating;  Surgeon: Dusty Sudie DEL, MD;  Location: [facility] OR;  Service: Open Heart Surgery;;   RIGHT/LEFT HEART CATH AND CORONARY ANGIOGRAPHY N/A 10/14/2018   Procedure: RIGHT/LEFT HEART CATH AND CORONARY ANGIOGRAPHY;  Surgeon: Elmira [name], MD;  Location: [facility] IN[facility]ASIVE [facility] LAB;  Service: Cardiovascular;  Laterality: N/A;   TEE WITHOUT CARDIOVERSION N/A 11/11/2018   Procedure: TRANSESOPHAGEAL ECHOCARDIOGRAM (TEE);  Surgeon: Elmira [name], MD;  Location: [facility] ENDOSCOPY;  Service: Cardiovascular;  Laterality: N/A;   TEE WITHOUT CARDIOVERSION N/A 12/16/2018   Procedure: TRANSESOPHAGEAL ECHOCARDIOGRAM (TEE);  Surgeon: Dusty Sudie DEL, MD;  Location[facility] OR;  Service: Open Heart Surgery;  Laterality: N/A;   TOTAL HIP ARTHROPLASTY Left 03/09/2019   Procedure: LEFT TOTAL HIP ARTHROPLASTY ANTERIOR APPROACH;  Surgeon: Jerri Kay HERO, MD;   Location: [facility] OR;  Service: Orthopedics;  Laterality: Left;   Family History  Problem Relation Age of Onset   COPD Father    Cancer Brother    Social History   Socioeconomic History   Marital status: Married    Spouse name: Not on file   Number  of children: 4   Years of education: Not on file   Highest education level: Not on file  Occupational History   Not on file  Tobacco Use   Smoking status: Former    Current packs/day: 0.00    Average packs/day: 1 pack/day for 7.0 years (7.0 ttl pk-yrs)    Types: Cigarettes    Start date: 2004    Quit date: 2011    Years since quitting: 15.0   Smokeless tobacco: Never   Tobacco comments:    30 PY  Vaping Use   Vaping status: Never Used  Substance and Sexual Activity   Alcohol use: No   Drug use: No   Sexual activity: Not on file  Other Topics Concern   Not on file  Social History Narrative   Left Handed   Two Story Home   Drinks little caffeine    Social Drivers of Health   Tobacco Use: Medium Risk (06/01/2024)   Patient History    Smoking Tobacco Use: Former    Smokeless Tobacco Use: Never    Passive Exposure: Not on Actuary Strain: Not on file  Food Insecurity: Not on file  Transportation Needs: Not on file  Physical Activity: Not on file  Stress: Not on file  Social Connections: Unknown (10/09/2021)   [facility]   Social Network    Social Network: Not on file  Intimate Partner Violence: Unknown (08/31/2021)   Received from [facility]   HITS    Physically Hurt: Not on file    Insult or Talk Down To: Not on file    Threaten Physical Harm: Not on file    Scream or Curse: Not on file  Depression (PHQ2-9): Not on file  Alcohol Screen: Not on file  Housing: Not on file  Utilities: Not on file  Health Literacy: Not on file       Objective:  BP 101/71   Pulse 73   Temp 98.2 F (36.8 C) (Oral)   Ht 5' 11 (1.803 m) Comment: per patient  Wt 247 lb 6.4 oz (112.2 kg)   SpO2  96% Comment: on RA  BMI 34.51 kg/m  Wt Readings from Last 3 Encounters:  06/01/24 247 lb 6.4 oz (112.2 kg)  04/01/24 258 lb 3.2 oz (117.1 kg)  03/23/24 250 lb (113.4 kg)   SpO2 Readings from Last 3 Encounters:  06/01/24 96%  04/01/24 100%  03/23/24 93%    Physical Exam Constitutional:      Appearance: Normal appearance.  HENT:     Head: Normocephalic.     Mouth/Throat:     Mouth: Mucous membranes are moist.  Eyes:     Extraocular Movements: Extraocular movements intact.     Pupils: Pupils are equal, round, and reactive to light.  Cardiovascular:     Rate and Rhythm: Normal rate and regular rhythm.     Comments: Systolic murmur present Pulmonary:     Effort: Pulmonary effort is normal.     Breath sounds: Normal breath sounds.     Comments: No wheezing Musculoskeletal:        General: Normal range of motion.  Neurological:     General: No focal deficit present.     Mental Status: He is alert and oriented to person, place, and time.      Echo 09/2020 Normal LV sytolic fx, EF 55-60%. LVH. Bioprosthetic valve in the aortic.  CT chest 09/2022 Lungs/Pleura: Major airways are patent. Lung volumes and  ventilation are improved from the 2020 CTA. Right pleural effusion at that time has resolved. Chronic postoperative changes to the anterior right upper ribs, right upper lobe, and small chronic postoperative pulmonary herniation into the anterior chest wall (series 5, image 35). Regional lung architectural distortion there has regressed but not resolved since 2020.  CXR 04/01/2024 No active cardiopulmonary disease. No significant interval change.   04/01/24 FENO 16  Eos 300 Allergy  panel: IgE 32, negative panel   PFTs  05/29/2024 FVC   2.6/-3.3/58% FEV1  1.7/-3.4/51% F/F       66% TLC     6/-1.2/85% RV       2.7/+0.8/113% DLCO  16/-2.6/61%  05/29/2024 - fixed moderate obstruction, no improve with BD, midl reduction of DLCO  Assessment & Plan:   Assessment &  Plan Moderate persistent asthma/COPD overlap syndrome Eosinophilia ~300, IgE normal, allergy  panel neg. Symptoms worsened post-heart surgery. Pulmonary function tests suggest COPD pattern, but asthma remains a consideration. Discussed Trelegy switch; insurance may require other trials first. - Ordered Trelegy and sent to [facility] pharmacy for trial. - Continue Symbicort  if Trelegy is not covered. - Plan to add Spiriva  if Trelegy is not covered. - Use nebulizer as needed for wheezing or shortness of breath. - Encouraged daily exercise, such as walking laps at home.  I spent 25 minutes in this encounter.  Return in about 4 months (around 09/29/2024).    Marny Patch, MD Pulmonary and Critical Care Medicine [facility] "

## 2024-06-01 NOTE — Patient Instructions (Addendum)
 Dear Mr. Walker;  I will recommend the following: -I [facility] hopefully it is covered. This will replace the symbicort . Use 1 puff in the morning daily. Rinse your mouth after each use.  -Let me know if you are not able to get the Trelegy and I will add a new inhaler in addition to your Symbicort . -Continue exercise every day, that it is very important for you.  -I will see you 4 months.

## 2024-06-03 ENCOUNTER — Other Ambulatory Visit: Payer: Self-pay

## 2024-06-03 MED ORDER — SPIRIVA RESPIMAT 2.5 MCG/ACT IN AERS: 2.0000 | INHALATION_SPRAY | Freq: Every day | RESPIRATORY_TRACT | 6 refills | Status: AC

## 2024-06-12 ENCOUNTER — Other Ambulatory Visit: Payer: Self-pay

## 2024-06-12 ENCOUNTER — Encounter (HOSPITAL_BASED_OUTPATIENT_CLINIC_OR_DEPARTMENT_OTHER): Payer: Self-pay

## 2024-06-12 ENCOUNTER — Emergency Department (HOSPITAL_BASED_OUTPATIENT_CLINIC_OR_DEPARTMENT_OTHER)
Admission: EM | Admit: 2024-06-12 | Discharge: 2024-06-12 | Disposition: A | Discharge status: Home / Self Care | Attending: Emergency Medicine | Admitting: Emergency Medicine

## 2024-06-12 ENCOUNTER — Emergency Department (HOSPITAL_BASED_OUTPATIENT_CLINIC_OR_DEPARTMENT_OTHER)

## 2024-06-12 ENCOUNTER — Emergency Department (HOSPITAL_BASED_OUTPATIENT_CLINIC_OR_DEPARTMENT_OTHER): Admitting: Radiology

## 2024-06-12 DIAGNOSIS — Y92410 Unspecified street and highway as the place of occurrence of the external cause: Secondary | ICD-10-CM | POA: Insufficient documentation

## 2024-06-12 DIAGNOSIS — Z7982 Long term (current) use of aspirin: Secondary | ICD-10-CM | POA: Insufficient documentation

## 2024-06-12 DIAGNOSIS — R519 Headache, unspecified: Secondary | ICD-10-CM | POA: Insufficient documentation

## 2024-06-12 DIAGNOSIS — M545 Low back pain, unspecified: Secondary | ICD-10-CM | POA: Insufficient documentation

## 2024-06-12 DIAGNOSIS — M542 Cervicalgia: Secondary | ICD-10-CM

## 2024-06-12 MED ORDER — ACETAMINOPHEN 500 MG PO TABS
1000.0000 mg | ORAL_TABLET | Freq: Once | ORAL | Status: AC
  Administered 2024-06-12: 1000 mg via ORAL
  Filled 2024-06-12: qty 2

## 2024-06-12 MED ORDER — OXYCODONE HCL 5 MG PO TABS
5.0000 mg | ORAL_TABLET | Freq: Once | ORAL | Status: AC
  Administered 2024-06-12: 5 mg via ORAL
  Filled 2024-06-12: qty 1
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
## 2024-06-12 NOTE — Discharge Instructions (Addendum)
 Your pictures look good.  No signs of broken bones in the neck or bleeding inside the skull.  No signs of broken bones in your low back.  Typically this will hurt worse tomorrow and get better over the course of the week..  Please follow-up with your family doctor in clinic.  Take 4 over the counter ibuprofen  tablets 3 times a day or 2 over-the-counter naproxen  tablets twice a day for pain. Also take tylenol  1000mg (2 extra strength) four times a day.

## 2024-08-12 ENCOUNTER — Ambulatory Visit: Admitting: Neurology
